# Patient Record
Sex: Female | Born: 1940
Health system: Southern US, Community
[De-identification: ages and names within clinical notes are randomized; demographics above are authoritative.]

## PROBLEM LIST (undated history)

## (undated) DIAGNOSIS — E079 Disorder of thyroid, unspecified: Secondary | ICD-10-CM

## (undated) DIAGNOSIS — T7840XA Allergy, unspecified, initial encounter: Secondary | ICD-10-CM

## (undated) DIAGNOSIS — N879 Dysplasia of cervix uteri, unspecified: Secondary | ICD-10-CM

## (undated) DIAGNOSIS — I10 Essential (primary) hypertension: Secondary | ICD-10-CM

## (undated) DIAGNOSIS — H269 Unspecified cataract: Secondary | ICD-10-CM

## (undated) DIAGNOSIS — N736 Female pelvic peritoneal adhesions (postinfective): Secondary | ICD-10-CM

## (undated) DIAGNOSIS — R102 Pelvic and perineal pain unspecified side: Secondary | ICD-10-CM

## (undated) DIAGNOSIS — F419 Anxiety disorder, unspecified: Secondary | ICD-10-CM

## (undated) DIAGNOSIS — K219 Gastro-esophageal reflux disease without esophagitis: Secondary | ICD-10-CM

## (undated) DIAGNOSIS — R51 Headache: Secondary | ICD-10-CM

## (undated) DIAGNOSIS — J329 Chronic sinusitis, unspecified: Secondary | ICD-10-CM

## (undated) DIAGNOSIS — K589 Irritable bowel syndrome without diarrhea: Secondary | ICD-10-CM

## (undated) DIAGNOSIS — M8580 Other specified disorders of bone density and structure, unspecified site: Secondary | ICD-10-CM

## (undated) DIAGNOSIS — M1990 Unspecified osteoarthritis, unspecified site: Secondary | ICD-10-CM

## (undated) HISTORY — DX: Allergy, unspecified, initial encounter: T78.40XA

## (undated) HISTORY — PX: FOOT SURGERY: SHX648

## (undated) HISTORY — PX: HAMMER TOE SURGERY: SHX385

## (undated) HISTORY — DX: Anxiety disorder, unspecified: F41.9

## (undated) HISTORY — PX: VESICOVAGINAL FISTULA CLOSURE W/ TAH: SUR271

## (undated) HISTORY — PX: COLPOSCOPY: SHX161

## (undated) HISTORY — DX: Female pelvic peritoneal adhesions (postinfective): N73.6

## (undated) HISTORY — DX: Chronic sinusitis, unspecified: J32.9

## (undated) HISTORY — DX: Pelvic and perineal pain unspecified side: R10.20

## (undated) HISTORY — DX: Disorder of thyroid, unspecified: E07.9

## (undated) HISTORY — DX: Headache: R51

## (undated) HISTORY — PX: EYE SURGERY: SHX253

## (undated) HISTORY — PX: TONSILLECTOMY: SUR1361

## (undated) HISTORY — DX: Pelvic and perineal pain: R10.2

## (undated) HISTORY — PX: ROTATOR CUFF REPAIR: SHX139

## (undated) HISTORY — DX: Other specified disorders of bone density and structure, unspecified site: M85.80

## (undated) HISTORY — DX: Irritable bowel syndrome, unspecified: K58.9

## (undated) HISTORY — DX: Essential (primary) hypertension: I10

## (undated) HISTORY — PX: BREAST SURGERY: SHX581

## (undated) HISTORY — DX: Unspecified cataract: H26.9

## (undated) HISTORY — DX: Dysplasia of cervix uteri, unspecified: N87.9

## (undated) HISTORY — DX: Gastro-esophageal reflux disease without esophagitis: K21.9

## (undated) HISTORY — DX: Unspecified osteoarthritis, unspecified site: M19.90

---

## 1979-01-15 HISTORY — PX: TUBAL LIGATION: SHX77

## 1980-02-15 HISTORY — PX: OTHER SURGICAL HISTORY: SHX169

## 1981-01-14 HISTORY — PX: ABDOMINAL HYSTERECTOMY: SHX81

## 1992-01-15 HISTORY — PX: APPENDECTOMY: SHX54

## 1996-10-13 ENCOUNTER — Encounter: Payer: Self-pay | Admitting: Pulmonary Disease

## 1997-04-18 ENCOUNTER — Encounter: Admission: RE | Admit: 1997-04-18 | Discharge: 1997-07-17 | Payer: Self-pay | Admitting: Pulmonary Disease

## 1998-11-10 ENCOUNTER — Encounter: Payer: Self-pay | Admitting: Pulmonary Disease

## 1998-11-10 ENCOUNTER — Ambulatory Visit (HOSPITAL_COMMUNITY): Admission: RE | Admit: 1998-11-10 | Discharge: 1998-11-10 | Payer: Self-pay | Admitting: Pulmonary Disease

## 1999-05-15 ENCOUNTER — Encounter: Admission: RE | Admit: 1999-05-15 | Discharge: 1999-05-15 | Payer: Self-pay | Admitting: Pulmonary Disease

## 1999-05-15 ENCOUNTER — Encounter: Payer: Self-pay | Admitting: Pulmonary Disease

## 2000-08-11 ENCOUNTER — Other Ambulatory Visit: Admission: RE | Admit: 2000-08-11 | Discharge: 2000-08-11 | Payer: Self-pay | Admitting: Obstetrics and Gynecology

## 2001-08-11 ENCOUNTER — Other Ambulatory Visit: Admission: RE | Admit: 2001-08-11 | Discharge: 2001-08-11 | Payer: Self-pay | Admitting: Obstetrics and Gynecology

## 2002-08-16 ENCOUNTER — Other Ambulatory Visit: Admission: RE | Admit: 2002-08-16 | Discharge: 2002-08-16 | Payer: Self-pay | Admitting: Obstetrics and Gynecology

## 2003-01-28 ENCOUNTER — Ambulatory Visit (HOSPITAL_COMMUNITY): Admission: RE | Admit: 2003-01-28 | Discharge: 2003-01-28 | Payer: Self-pay | Admitting: Pulmonary Disease

## 2003-08-17 ENCOUNTER — Other Ambulatory Visit: Admission: RE | Admit: 2003-08-17 | Discharge: 2003-08-17 | Payer: Self-pay | Admitting: Obstetrics and Gynecology

## 2003-09-14 ENCOUNTER — Ambulatory Visit (HOSPITAL_COMMUNITY): Admission: RE | Admit: 2003-09-14 | Discharge: 2003-09-14 | Payer: Self-pay | Admitting: Pulmonary Disease

## 2003-11-30 ENCOUNTER — Ambulatory Visit: Payer: Self-pay | Admitting: Pulmonary Disease

## 2004-03-05 ENCOUNTER — Ambulatory Visit: Payer: Self-pay | Admitting: Pulmonary Disease

## 2004-03-26 ENCOUNTER — Ambulatory Visit: Payer: Self-pay

## 2004-04-23 ENCOUNTER — Ambulatory Visit: Payer: Self-pay | Admitting: Internal Medicine

## 2004-04-23 ENCOUNTER — Ambulatory Visit: Payer: Self-pay | Admitting: Pulmonary Disease

## 2004-05-25 ENCOUNTER — Ambulatory Visit: Payer: Self-pay | Admitting: Pulmonary Disease

## 2004-06-04 ENCOUNTER — Ambulatory Visit (HOSPITAL_BASED_OUTPATIENT_CLINIC_OR_DEPARTMENT_OTHER): Admission: RE | Admit: 2004-06-04 | Discharge: 2004-06-04 | Payer: Self-pay | Admitting: Orthopedic Surgery

## 2004-06-04 ENCOUNTER — Ambulatory Visit (HOSPITAL_COMMUNITY): Admission: RE | Admit: 2004-06-04 | Discharge: 2004-06-04 | Payer: Self-pay | Admitting: Orthopedic Surgery

## 2004-08-20 ENCOUNTER — Other Ambulatory Visit: Admission: RE | Admit: 2004-08-20 | Discharge: 2004-08-20 | Payer: Self-pay | Admitting: Obstetrics and Gynecology

## 2004-10-31 ENCOUNTER — Ambulatory Visit: Payer: Self-pay | Admitting: Pulmonary Disease

## 2004-11-15 ENCOUNTER — Ambulatory Visit: Payer: Self-pay | Admitting: Pulmonary Disease

## 2004-12-28 ENCOUNTER — Ambulatory Visit: Payer: Self-pay | Admitting: Pulmonary Disease

## 2005-03-08 ENCOUNTER — Ambulatory Visit: Payer: Self-pay | Admitting: Physical Medicine and Rehabilitation

## 2005-03-08 ENCOUNTER — Encounter
Admission: RE | Admit: 2005-03-08 | Discharge: 2005-06-06 | Payer: Self-pay | Admitting: Physical Medicine and Rehabilitation

## 2005-03-14 ENCOUNTER — Encounter
Admission: RE | Admit: 2005-03-14 | Discharge: 2005-03-20 | Payer: Self-pay | Admitting: Physical Medicine and Rehabilitation

## 2005-04-16 ENCOUNTER — Ambulatory Visit: Payer: Self-pay | Admitting: Gastroenterology

## 2005-04-17 ENCOUNTER — Encounter (INDEPENDENT_AMBULATORY_CARE_PROVIDER_SITE_OTHER): Payer: Self-pay | Admitting: *Deleted

## 2005-04-17 ENCOUNTER — Ambulatory Visit: Payer: Self-pay | Admitting: Gastroenterology

## 2005-04-17 DIAGNOSIS — D139 Benign neoplasm of ill-defined sites within the digestive system: Secondary | ICD-10-CM

## 2005-04-18 ENCOUNTER — Ambulatory Visit: Payer: Self-pay | Admitting: Gastroenterology

## 2005-04-22 ENCOUNTER — Encounter: Payer: Self-pay | Admitting: Gastroenterology

## 2005-04-23 ENCOUNTER — Ambulatory Visit: Payer: Self-pay | Admitting: Pulmonary Disease

## 2005-05-21 ENCOUNTER — Ambulatory Visit: Payer: Self-pay | Admitting: Gastroenterology

## 2005-07-08 ENCOUNTER — Ambulatory Visit: Payer: Self-pay | Admitting: Pulmonary Disease

## 2005-07-15 ENCOUNTER — Ambulatory Visit (HOSPITAL_COMMUNITY): Admission: RE | Admit: 2005-07-15 | Discharge: 2005-07-15 | Payer: Self-pay | Admitting: Pulmonary Disease

## 2005-08-21 ENCOUNTER — Other Ambulatory Visit: Admission: RE | Admit: 2005-08-21 | Discharge: 2005-08-21 | Payer: Self-pay | Admitting: Obstetrics and Gynecology

## 2005-09-05 ENCOUNTER — Ambulatory Visit: Payer: Self-pay | Admitting: Gastroenterology

## 2005-09-27 ENCOUNTER — Ambulatory Visit: Payer: Self-pay | Admitting: Gastroenterology

## 2005-10-14 ENCOUNTER — Ambulatory Visit: Payer: Self-pay | Admitting: Gastroenterology

## 2005-10-29 ENCOUNTER — Ambulatory Visit: Payer: Self-pay | Admitting: Pulmonary Disease

## 2005-11-11 ENCOUNTER — Ambulatory Visit: Payer: Self-pay | Admitting: Pulmonary Disease

## 2005-12-11 ENCOUNTER — Ambulatory Visit: Payer: Self-pay | Admitting: Pulmonary Disease

## 2006-04-25 ENCOUNTER — Ambulatory Visit: Payer: Self-pay | Admitting: Pulmonary Disease

## 2006-04-25 LAB — CONVERTED CEMR LAB
Albumin: 3.6 g/dL (ref 3.5–5.2)
Basophils Absolute: 0 10*3/uL (ref 0.0–0.1)
Bilirubin, Direct: 0.1 mg/dL (ref 0.0–0.3)
CO2: 30 meq/L (ref 19–32)
Cholesterol: 183 mg/dL (ref 0–200)
Creatinine, Ser: 0.8 mg/dL (ref 0.4–1.2)
Glucose, Bld: 101 mg/dL — ABNORMAL HIGH (ref 70–99)
HCT: 37.6 % (ref 36.0–46.0)
HDL: 57.6 mg/dL (ref >39.0)
Hemoglobin, Urine: NEGATIVE
Hemoglobin: 13.1 g/dL (ref 12.0–15.0)
Leukocytes, UA: NEGATIVE
MCHC: 34.9 g/dL (ref 30.0–36.0)
Monocytes Absolute: 0.6 10*3/uL (ref 0.2–0.7)
Neutrophils Relative %: 54.7 % (ref 43.0–77.0)
Potassium: 4.3 meq/L (ref 3.5–5.1)
RDW: 11.8 % (ref 11.5–14.6)
Sodium: 144 meq/L (ref 135–145)
TSH: 1.67 microintl units/mL (ref 0.35–5.50)
Total Bilirubin: 0.7 mg/dL (ref 0.3–1.2)
Total Protein: 6.7 g/dL (ref 6.0–8.3)
Urobilinogen, UA: 0.2 (ref 0.0–1.0)
pH: 7 (ref 5.0–8.0)

## 2006-04-28 ENCOUNTER — Ambulatory Visit (HOSPITAL_BASED_OUTPATIENT_CLINIC_OR_DEPARTMENT_OTHER): Admission: RE | Admit: 2006-04-28 | Discharge: 2006-04-28 | Payer: Self-pay | Admitting: Orthopedic Surgery

## 2006-06-06 ENCOUNTER — Ambulatory Visit: Payer: Self-pay | Admitting: Pulmonary Disease

## 2006-06-06 LAB — CONVERTED CEMR LAB: OCCULT 1: POSITIVE — AB

## 2006-06-24 ENCOUNTER — Ambulatory Visit: Payer: Self-pay | Admitting: Pulmonary Disease

## 2006-07-15 ENCOUNTER — Ambulatory Visit: Payer: Self-pay | Admitting: Pulmonary Disease

## 2006-07-15 LAB — CONVERTED CEMR LAB
Fecal Occult Blood: NEGATIVE
OCCULT 2: NEGATIVE
OCCULT 3: NEGATIVE
OCCULT 4: NEGATIVE
OCCULT 5: NEGATIVE

## 2006-08-22 ENCOUNTER — Ambulatory Visit: Payer: Self-pay | Admitting: Pulmonary Disease

## 2006-09-10 ENCOUNTER — Other Ambulatory Visit: Admission: RE | Admit: 2006-09-10 | Discharge: 2006-09-10 | Payer: Self-pay | Admitting: Obstetrics and Gynecology

## 2006-11-10 ENCOUNTER — Ambulatory Visit: Payer: Self-pay | Admitting: Pulmonary Disease

## 2006-12-05 ENCOUNTER — Ambulatory Visit: Payer: Self-pay | Admitting: Pulmonary Disease

## 2007-01-30 ENCOUNTER — Telehealth (INDEPENDENT_AMBULATORY_CARE_PROVIDER_SITE_OTHER): Payer: Self-pay | Admitting: *Deleted

## 2007-01-30 ENCOUNTER — Ambulatory Visit: Payer: Self-pay | Admitting: Pulmonary Disease

## 2007-01-30 DIAGNOSIS — K5900 Constipation, unspecified: Secondary | ICD-10-CM | POA: Insufficient documentation

## 2007-01-30 DIAGNOSIS — T7840XA Allergy, unspecified, initial encounter: Secondary | ICD-10-CM | POA: Insufficient documentation

## 2007-01-30 DIAGNOSIS — K219 Gastro-esophageal reflux disease without esophagitis: Secondary | ICD-10-CM

## 2007-01-30 DIAGNOSIS — M1990 Unspecified osteoarthritis, unspecified site: Secondary | ICD-10-CM | POA: Insufficient documentation

## 2007-01-30 DIAGNOSIS — R519 Headache, unspecified: Secondary | ICD-10-CM | POA: Insufficient documentation

## 2007-01-30 DIAGNOSIS — R51 Headache: Secondary | ICD-10-CM

## 2007-01-30 DIAGNOSIS — I10 Essential (primary) hypertension: Secondary | ICD-10-CM

## 2007-01-30 DIAGNOSIS — K589 Irritable bowel syndrome without diarrhea: Secondary | ICD-10-CM | POA: Insufficient documentation

## 2007-02-02 ENCOUNTER — Encounter: Payer: Self-pay | Admitting: Pulmonary Disease

## 2007-03-04 ENCOUNTER — Telehealth: Payer: Self-pay | Admitting: Pulmonary Disease

## 2007-03-05 ENCOUNTER — Telehealth (INDEPENDENT_AMBULATORY_CARE_PROVIDER_SITE_OTHER): Payer: Self-pay | Admitting: *Deleted

## 2007-03-12 ENCOUNTER — Telehealth: Payer: Self-pay | Admitting: Pulmonary Disease

## 2007-04-16 ENCOUNTER — Telehealth: Payer: Self-pay | Admitting: Pulmonary Disease

## 2007-04-27 ENCOUNTER — Ambulatory Visit: Payer: Self-pay | Admitting: Pulmonary Disease

## 2007-04-29 ENCOUNTER — Encounter: Payer: Self-pay | Admitting: Pulmonary Disease

## 2007-04-29 ENCOUNTER — Ambulatory Visit: Payer: Self-pay | Admitting: Family Medicine

## 2007-05-10 LAB — CONVERTED CEMR LAB
ALT: 19 units/L (ref 0–35)
AST: 20 units/L (ref 0–37)
Alkaline Phosphatase: 53 units/L (ref 39–117)
Bilirubin, Direct: 0.1 mg/dL (ref 0.0–0.3)
CO2: 27 meq/L (ref 19–32)
Calcium: 8.8 mg/dL (ref 8.4–10.5)
Chloride: 106 meq/L (ref 96–112)
Eosinophils Relative: 1.4 % (ref 0.0–5.0)
Glucose, Bld: 91 mg/dL (ref 70–99)
HCT: 38.3 % (ref 36.0–46.0)
Hemoglobin, Urine: NEGATIVE
Hemoglobin: 12.8 g/dL (ref 12.0–15.0)
Lymphocytes Relative: 35.2 % (ref 12.0–46.0)
Monocytes Absolute: 0.5 10*3/uL (ref 0.1–1.0)
Monocytes Relative: 7.4 % (ref 3.0–12.0)
Mucus, UA: NEGATIVE
Neutro Abs: 3.9 10*3/uL (ref 1.4–7.7)
Potassium: 4.4 meq/L (ref 3.5–5.1)
Sodium: 143 meq/L (ref 135–145)
Total Bilirubin: 0.5 mg/dL (ref 0.3–1.2)
Total CHOL/HDL Ratio: 3.4
Total Protein: 6.8 g/dL (ref 6.0–8.3)
Urine Glucose: NEGATIVE mg/dL
WBC, UA: NONE SEEN cells/hpf
WBC: 7 10*3/uL (ref 4.5–10.5)
pH: 6 (ref 5.0–8.0)

## 2007-05-21 ENCOUNTER — Telehealth: Payer: Self-pay | Admitting: Pulmonary Disease

## 2007-05-28 ENCOUNTER — Telehealth: Payer: Self-pay | Admitting: Pulmonary Disease

## 2007-06-02 ENCOUNTER — Telehealth (INDEPENDENT_AMBULATORY_CARE_PROVIDER_SITE_OTHER): Payer: Self-pay | Admitting: *Deleted

## 2007-06-02 ENCOUNTER — Telehealth: Payer: Self-pay | Admitting: Gastroenterology

## 2007-06-03 ENCOUNTER — Ambulatory Visit: Payer: Self-pay | Admitting: Gastroenterology

## 2007-06-03 DIAGNOSIS — K449 Diaphragmatic hernia without obstruction or gangrene: Secondary | ICD-10-CM | POA: Insufficient documentation

## 2007-06-09 ENCOUNTER — Ambulatory Visit (HOSPITAL_COMMUNITY): Admission: RE | Admit: 2007-06-09 | Discharge: 2007-06-09 | Payer: Self-pay | Admitting: Gastroenterology

## 2007-06-23 ENCOUNTER — Telehealth: Payer: Self-pay | Admitting: Gastroenterology

## 2007-08-27 ENCOUNTER — Telehealth: Payer: Self-pay | Admitting: Pulmonary Disease

## 2007-08-31 ENCOUNTER — Telehealth (INDEPENDENT_AMBULATORY_CARE_PROVIDER_SITE_OTHER): Payer: Self-pay | Admitting: *Deleted

## 2007-09-10 ENCOUNTER — Encounter: Payer: Self-pay | Admitting: Pulmonary Disease

## 2007-09-11 ENCOUNTER — Other Ambulatory Visit: Admission: RE | Admit: 2007-09-11 | Discharge: 2007-09-11 | Payer: Self-pay | Admitting: Obstetrics and Gynecology

## 2007-09-23 ENCOUNTER — Encounter: Payer: Self-pay | Admitting: Gastroenterology

## 2007-10-16 ENCOUNTER — Telehealth (INDEPENDENT_AMBULATORY_CARE_PROVIDER_SITE_OTHER): Payer: Self-pay | Admitting: *Deleted

## 2007-10-16 ENCOUNTER — Emergency Department (HOSPITAL_COMMUNITY): Admission: EM | Admit: 2007-10-16 | Discharge: 2007-10-16 | Payer: Self-pay | Admitting: Emergency Medicine

## 2007-10-27 ENCOUNTER — Ambulatory Visit: Payer: Self-pay | Admitting: Pulmonary Disease

## 2007-11-12 ENCOUNTER — Telehealth (INDEPENDENT_AMBULATORY_CARE_PROVIDER_SITE_OTHER): Payer: Self-pay | Admitting: *Deleted

## 2008-02-03 ENCOUNTER — Telehealth: Payer: Self-pay | Admitting: Pulmonary Disease

## 2008-02-22 ENCOUNTER — Ambulatory Visit: Payer: Self-pay | Admitting: Pulmonary Disease

## 2008-02-22 DIAGNOSIS — J019 Acute sinusitis, unspecified: Secondary | ICD-10-CM

## 2008-03-07 ENCOUNTER — Telehealth: Payer: Self-pay | Admitting: Pulmonary Disease

## 2008-04-26 ENCOUNTER — Ambulatory Visit: Payer: Self-pay | Admitting: Pulmonary Disease

## 2008-04-26 ENCOUNTER — Telehealth (INDEPENDENT_AMBULATORY_CARE_PROVIDER_SITE_OTHER): Payer: Self-pay | Admitting: *Deleted

## 2008-04-26 DIAGNOSIS — R252 Cramp and spasm: Secondary | ICD-10-CM

## 2008-05-04 LAB — CONVERTED CEMR LAB
BUN: 20 mg/dL (ref 6–23)
CO2: 30 meq/L (ref 19–32)
Calcium: 9.3 mg/dL (ref 8.4–10.5)
Chloride: 104 meq/L (ref 96–112)
Creatinine, Ser: 0.9 mg/dL (ref 0.4–1.2)
Glucose, Bld: 86 mg/dL (ref 70–99)

## 2008-05-11 ENCOUNTER — Ambulatory Visit: Payer: Self-pay | Admitting: Pulmonary Disease

## 2008-05-11 DIAGNOSIS — R269 Unspecified abnormalities of gait and mobility: Secondary | ICD-10-CM | POA: Insufficient documentation

## 2008-05-14 LAB — CONVERTED CEMR LAB
AST: 22 units/L (ref 0–37)
Albumin: 3.8 g/dL (ref 3.5–5.2)
Alkaline Phosphatase: 65 units/L (ref 39–117)
Basophils Relative: 0.6 % (ref 0.0–3.0)
Bilirubin Urine: NEGATIVE
CO2: 29 meq/L (ref 19–32)
Calcium: 9.1 mg/dL (ref 8.4–10.5)
Direct LDL: 105.2 mg/dL
Eosinophils Absolute: 0.1 10*3/uL (ref 0.0–0.7)
Glucose, Bld: 89 mg/dL (ref 70–99)
HCT: 37.6 % (ref 36.0–46.0)
Hemoglobin, Urine: NEGATIVE
Hemoglobin: 13.4 g/dL (ref 12.0–15.0)
Ketones, ur: NEGATIVE mg/dL
Leukocytes, UA: NEGATIVE
Lymphocytes Relative: 35.2 % (ref 12.0–46.0)
Lymphs Abs: 2.3 10*3/uL (ref 0.7–4.0)
MCHC: 35.6 g/dL (ref 30.0–36.0)
Monocytes Relative: 7.1 % (ref 3.0–12.0)
Neutro Abs: 3.5 10*3/uL (ref 1.4–7.7)
Potassium: 3.9 meq/L (ref 3.5–5.1)
RBC: 4.07 M/uL (ref 3.87–5.11)
RDW: 11.8 % (ref 11.5–14.6)
Sodium: 141 meq/L (ref 135–145)
TSH: 2.52 microintl units/mL (ref 0.35–5.50)
Total CHOL/HDL Ratio: 3
Total Protein: 7.4 g/dL (ref 6.0–8.3)
Triglycerides: 116 mg/dL (ref 0.0–149.0)
Urine Glucose: NEGATIVE mg/dL
Urobilinogen, UA: 0.2 (ref 0.0–1.0)

## 2008-06-21 ENCOUNTER — Encounter: Payer: Self-pay | Admitting: Pulmonary Disease

## 2008-06-22 ENCOUNTER — Encounter: Payer: Self-pay | Admitting: Pulmonary Disease

## 2008-09-12 ENCOUNTER — Other Ambulatory Visit: Admission: RE | Admit: 2008-09-12 | Discharge: 2008-09-12 | Payer: Self-pay | Admitting: Obstetrics and Gynecology

## 2008-09-12 ENCOUNTER — Encounter: Payer: Self-pay | Admitting: Obstetrics and Gynecology

## 2008-09-12 ENCOUNTER — Ambulatory Visit: Payer: Self-pay | Admitting: Obstetrics and Gynecology

## 2008-11-08 ENCOUNTER — Ambulatory Visit: Payer: Self-pay | Admitting: Pulmonary Disease

## 2008-11-08 DIAGNOSIS — E785 Hyperlipidemia, unspecified: Secondary | ICD-10-CM

## 2008-11-17 ENCOUNTER — Ambulatory Visit: Payer: Self-pay | Admitting: Adult Health

## 2008-12-12 ENCOUNTER — Telehealth (INDEPENDENT_AMBULATORY_CARE_PROVIDER_SITE_OTHER): Payer: Self-pay | Admitting: *Deleted

## 2008-12-19 ENCOUNTER — Telehealth (INDEPENDENT_AMBULATORY_CARE_PROVIDER_SITE_OTHER): Payer: Self-pay | Admitting: *Deleted

## 2009-01-30 ENCOUNTER — Ambulatory Visit: Payer: Self-pay | Admitting: Pulmonary Disease

## 2009-01-30 ENCOUNTER — Encounter: Payer: Self-pay | Admitting: Adult Health

## 2009-01-31 LAB — CONVERTED CEMR LAB
ALT: 22 units/L (ref 0–35)
AST: 24 units/L (ref 0–37)
Albumin: 3.9 g/dL (ref 3.5–5.2)
BUN: 16 mg/dL (ref 6–23)
Basophils Absolute: 0.1 10*3/uL (ref 0.0–0.1)
Basophils Relative: 0.9 % (ref 0.0–3.0)
Bilirubin Urine: NEGATIVE
Calcium: 9.4 mg/dL (ref 8.4–10.5)
Creatinine, Ser: 0.9 mg/dL (ref 0.4–1.2)
Eosinophils Absolute: 0.1 10*3/uL (ref 0.0–0.7)
HCT: 39.1 % (ref 36.0–46.0)
Hemoglobin, Urine: NEGATIVE
Hemoglobin: 13.4 g/dL (ref 12.0–15.0)
Leukocytes, UA: NEGATIVE
Lymphs Abs: 2.8 10*3/uL (ref 0.7–4.0)
MCHC: 34.4 g/dL (ref 30.0–36.0)
MCV: 93.4 fL (ref 78.0–100.0)
Neutro Abs: 3 10*3/uL (ref 1.4–7.7)
Nitrite: NEGATIVE
RBC: 4.19 M/uL (ref 3.87–5.11)
RDW: 12.1 % (ref 11.5–14.6)
TSH: 1.9 microintl units/mL (ref 0.35–5.50)
Total Bilirubin: 0.5 mg/dL (ref 0.3–1.2)
Total Protein, Urine: NEGATIVE mg/dL
pH: 5.5 (ref 5.0–8.0)

## 2009-02-17 ENCOUNTER — Ambulatory Visit: Payer: Self-pay | Admitting: Obstetrics and Gynecology

## 2009-03-02 ENCOUNTER — Telehealth: Payer: Self-pay | Admitting: Pulmonary Disease

## 2009-03-07 ENCOUNTER — Encounter (INDEPENDENT_AMBULATORY_CARE_PROVIDER_SITE_OTHER): Payer: Self-pay | Admitting: *Deleted

## 2009-03-09 ENCOUNTER — Telehealth (INDEPENDENT_AMBULATORY_CARE_PROVIDER_SITE_OTHER): Payer: Self-pay | Admitting: *Deleted

## 2009-04-05 ENCOUNTER — Ambulatory Visit: Payer: Self-pay | Admitting: Gastroenterology

## 2009-05-08 ENCOUNTER — Ambulatory Visit: Payer: Self-pay | Admitting: Pulmonary Disease

## 2009-05-09 ENCOUNTER — Ambulatory Visit: Payer: Self-pay | Admitting: Pulmonary Disease

## 2009-05-11 ENCOUNTER — Telehealth: Payer: Self-pay | Admitting: Pulmonary Disease

## 2009-05-12 ENCOUNTER — Telehealth (INDEPENDENT_AMBULATORY_CARE_PROVIDER_SITE_OTHER): Payer: Self-pay | Admitting: *Deleted

## 2009-05-13 LAB — CONVERTED CEMR LAB
BUN: 9 mg/dL (ref 6–23)
Basophils Absolute: 0 10*3/uL (ref 0.0–0.1)
Bilirubin, Direct: 0.1 mg/dL (ref 0.0–0.3)
CO2: 28 meq/L (ref 19–32)
Chloride: 109 meq/L (ref 96–112)
GFR calc non Af Amer: 80.32 mL/min (ref >60)
Glucose, Bld: 85 mg/dL (ref 70–99)
HCT: 35.8 % — ABNORMAL LOW (ref 36.0–46.0)
HDL: 60.8 mg/dL (ref >39.00)
Hemoglobin: 12.4 g/dL (ref 12.0–15.0)
LDL Cholesterol: 87 mg/dL (ref 0–99)
Lymphs Abs: 2.9 10*3/uL (ref 0.7–4.0)
MCHC: 34.6 g/dL (ref 30.0–36.0)
MCV: 91.8 fL (ref 78.0–100.0)
Monocytes Absolute: 0.7 10*3/uL (ref 0.1–1.0)
Monocytes Relative: 9.4 % (ref 3.0–12.0)
Neutro Abs: 3.9 10*3/uL (ref 1.4–7.7)
Platelets: 257 10*3/uL (ref 150.0–400.0)
Potassium: 4.2 meq/L (ref 3.5–5.1)
RDW: 12.8 % (ref 11.5–14.6)
Sodium: 143 meq/L (ref 135–145)
Total Bilirubin: 0.4 mg/dL (ref 0.3–1.2)
Total CHOL/HDL Ratio: 3
VLDL: 27.6 mg/dL (ref 0.0–40.0)

## 2009-05-17 ENCOUNTER — Ambulatory Visit: Payer: Self-pay | Admitting: Internal Medicine

## 2009-05-17 ENCOUNTER — Encounter: Payer: Self-pay | Admitting: Pulmonary Disease

## 2009-09-13 ENCOUNTER — Ambulatory Visit: Payer: Self-pay | Admitting: Obstetrics and Gynecology

## 2009-09-13 ENCOUNTER — Other Ambulatory Visit: Admission: RE | Admit: 2009-09-13 | Discharge: 2009-09-13 | Payer: Self-pay | Admitting: Obstetrics and Gynecology

## 2009-10-04 ENCOUNTER — Telehealth: Payer: Self-pay | Admitting: Pulmonary Disease

## 2009-10-10 ENCOUNTER — Ambulatory Visit: Payer: Self-pay | Admitting: Obstetrics and Gynecology

## 2009-11-02 ENCOUNTER — Telehealth (INDEPENDENT_AMBULATORY_CARE_PROVIDER_SITE_OTHER): Payer: Self-pay | Admitting: *Deleted

## 2009-11-07 ENCOUNTER — Ambulatory Visit: Payer: Self-pay | Admitting: Pulmonary Disease

## 2009-11-14 ENCOUNTER — Encounter: Admission: RE | Admit: 2009-11-14 | Discharge: 2009-11-14 | Payer: Self-pay | Admitting: Orthopedic Surgery

## 2009-11-22 ENCOUNTER — Telehealth (INDEPENDENT_AMBULATORY_CARE_PROVIDER_SITE_OTHER): Payer: Self-pay | Admitting: *Deleted

## 2009-11-27 ENCOUNTER — Ambulatory Visit: Payer: Self-pay | Admitting: Pulmonary Disease

## 2009-11-28 DIAGNOSIS — J209 Acute bronchitis, unspecified: Secondary | ICD-10-CM | POA: Insufficient documentation

## 2009-11-30 ENCOUNTER — Encounter: Admission: RE | Admit: 2009-11-30 | Discharge: 2009-11-30 | Payer: Self-pay | Admitting: Orthopedic Surgery

## 2009-12-06 ENCOUNTER — Telehealth (INDEPENDENT_AMBULATORY_CARE_PROVIDER_SITE_OTHER): Payer: Self-pay | Admitting: *Deleted

## 2009-12-08 ENCOUNTER — Ambulatory Visit: Payer: Self-pay | Admitting: Internal Medicine

## 2009-12-08 LAB — CONVERTED CEMR LAB
ALT: 23 units/L (ref 0–35)
BUN: 15 mg/dL (ref 6–23)
Calcium: 8.8 mg/dL (ref 8.4–10.5)
Chloride: 102 meq/L (ref 96–112)
Creatinine, Ser: 1 mg/dL (ref 0.4–1.2)
Magnesium: 1.9 mg/dL (ref 1.5–2.5)
Total Bilirubin: 0.4 mg/dL (ref 0.3–1.2)
Total Protein: 6.4 g/dL (ref 6.0–8.3)

## 2009-12-26 ENCOUNTER — Encounter: Payer: Self-pay | Admitting: Pulmonary Disease

## 2010-01-10 ENCOUNTER — Ambulatory Visit: Payer: Self-pay | Admitting: Pulmonary Disease

## 2010-01-17 ENCOUNTER — Telehealth (INDEPENDENT_AMBULATORY_CARE_PROVIDER_SITE_OTHER): Payer: Self-pay | Admitting: *Deleted

## 2010-01-18 ENCOUNTER — Telehealth (INDEPENDENT_AMBULATORY_CARE_PROVIDER_SITE_OTHER): Payer: Self-pay | Admitting: *Deleted

## 2010-01-23 ENCOUNTER — Telehealth: Payer: Self-pay | Admitting: Pulmonary Disease

## 2010-02-13 NOTE — Assessment & Plan Note (Signed)
Summary: 6 months/apc-pt req pna shot//kp   Primary Care Provider:  Alroy Dust, MD  CC:  6 month ROV & review of mult medical problems....  History of Present Illness: 70 y/o BF here for a follow up visit... she has mult med problems as noted below... she gets her meds from [facility]...   ~  5/09 saw DrPatterson for GI- chr GERD on PPI Bid... he changed her to Nexium Bid & improved...  ~  8/09 saw DrShoemaker, ENT- c/o dizziness, "sinus"... Rx w/ Claritin, Saline, Flonase...  ~  10/09 went to ER w/ CP & epig discomfort "I think it was something I ate"... full eval in ER was neg including: EKG= norm,  CXR= NAD,  CBC= norm,  Chem/Lipase= norm,  DDimer= norm... no recurrence of symptoms...    ~  May 11, 2008:  she has mult complaints today- we reviewed her list together:      ** c/o balance off, veers to left, gait abnormality- neuro exam nonfocal, we discussed referral.      ** c/o left thigh discomfort c/w meralgia pain- rec> wt loss, doesn't want additional meds.      ** leg cramping/ muscle spasm- rec> try SOMA 350 Bid Prn.      ** c/o pain right flank near 11th-12th ribs, tender area- rec> topical Rx w/ heat, myoflex, pain med Prn.      ** incr stress- Mother (Nannie Leftwich) out of NH and on her own now & doing poorly...   ~  November 08, 2008:  she has gained 12# up to 214# today & notes some incr SOB & LBP... we discussed diet + exercise... she is handling the stress of caring for mother well... OK flu vacc.   ~  May 08, 2009:  she's had a good 43mo other than stress w/ mother... weight unchanged- exerc on treadmilll 20mi/d, but not really on diet & we discussed this... BP controlled;  GI stable- but she has had persistant RUQ/ right lower CWP & DrPatterson has referred her to Pain Management (Appt pending) & we discssed poss Lyrica trial since the discomfort occurs exclusively at night...    Current Problem List:  Health Maintenance - she takes ASA 81mg /d... her GYN is  DrGottsegen & he changed her Premarin to Climara... Mammograms at [facility]- last 8/09... BMD here 4/09 (Improved & Fosamax decr)> due for f/u BMD & poss drug holiday...  ~  Immunizations: had 2010 flu shot 10/10... had Pneumovax 2003 at age 15>  repeat PNEUMOVAX 4/11 age 62.  ALLERGY (ICD-995.3) - 8/09 saw DrShoemaker, ENT- c/o dizziness, "sinus"... Rx w/ Claritin, Saline, Flonase...  HYPERTENSION (ICD-401.9) - on TOPROL XL 100mg /d, & LOTREL 5/20 Daily... BP= 120/64 and similiar at home per pt in the 130/80 range... denies HA, visual changes, CP, palipit, dizziness, syncope, dyspnea, edema, etc... she exercises by walking...  ~  CXR 4/11 was clear, WNL...   HYPERCHOLESTEROLEMIA, BORDERLINE (ICD-272.4) - on diet alone...  ~  FLP 4/10 showed TChol 209, TG 116, HDL 67, LDL 105  ~  FLP 4/11 showed =   GERD (ICD-530.81) - on NEXIUM 40mg Bid...  ~  last EGD was 4/07 by DrPatterson showing chr GERD and gastric polyps...   ~  AbdSonar 2007 w/ no gallstones, negative sonar...   ~  HIDA Scan 5/09 was norm w/ eject fract 76% & patent ducts...  IRRITABLE BOWEL SYNDROME (ICD-564.1) -   ~  last colonoscopy 9/07 by DrPatterson showed melanosis only, no other  abn.Marland KitchenMarland Kitchen  CONSTIPATION (ICD-564.00) - on Metamucil etc...  DEGENERATIVE JOINT DISEASE (ICD-715.90) - she uses arthritis tylenol as needed...  OSTEOPOROSIS (ICD-733.00) - on FOSAMAX 70mg  every other wk + calcium and vits & 1000 VitD.  ~  BMD 4/09 much improved w/ TScores -0.2 to -0.7.Marland KitchenMarland Kitchen Fosamax decr to [employer].  ~  Vit D level 4/09 = 32... rec> Vit D 1000 u daily...  ~  Vit D level 4/10 = 46... rec> continue same.  ~  4/11:  we will recheck BMD & consider bisphos drug holiday...  HEADACHE (ICD-784.0)  ANXIETY (ICD-300.00) - on ALPRAZOLAM 0.5mg  as needed... her husb had an MI...   Allergies: 1)  ! Prednisone 2)  Vioxx 3)  Sulfa 4)  Norvasc 5)  Seldane  Comments:  Nurse/Medical Assistant: The patient's medications and allergies were reviewed  with the patient and were updated in the Medication and Allergy Lists.  Past History:  Past Medical History: ALLERGY (ICD-995.3) HYPERTENSION (ICD-401.9) HYPERCHOLESTEROLEMIA, BORDERLINE (ICD-272.4) GERD (ICD-530.81) IRRITABLE BOWEL SYNDROME (ICD-564.1) CONSTIPATION (ICD-564.00) DEGENERATIVE JOINT DISEASE (ICD-715.90) OSTEOPOROSIS (ICD-733.00) HEADACHE (ICD-784.0) ANXIETY (ICD-300.00)  Past Surgical History: S/P parathyroid adenoma removed S/P Hysterectomy Tonsillectomy Appendectomy  Family History: Reviewed history from 06/03/2007 and no changes required. No FH of Colon Cancer: Family History of Diabetes:  Family History of Heart Disease:  leukimia stroke lung cancer  Social History: Reviewed history from 04/05/2009 and no changes required. Re-married her first husband 1 child never smoked social alcohol retired Illicit Drug Use - no  Review of Systems      See HPI       The patient complains of dyspnea on exertion and peripheral edema.  The patient denies anorexia, fever, weight loss, weight gain, vision loss, decreased hearing, hoarseness, chest pain, syncope, prolonged cough, headaches, hemoptysis, abdominal pain, melena, hematochezia, severe indigestion/heartburn, hematuria, incontinence, muscle weakness, suspicious skin lesions, transient blindness, difficulty walking, depression, unusual weight change, abnormal bleeding, enlarged lymph nodes, and angioedema.    Vital Signs:  Patient profile:   70 year old female Height:      66.5 inches Weight:      210.25 pounds BMI:     33.55 O2 Sat:      100 % on Room air Temp:     97.1 degrees F oral Pulse rate:   65 / minute BP sitting:   120 / 64  (left arm) Cuff size:   regular  Vitals Entered By: Randell Loop CMA (May 08, 2009 11:03 AM)  O2 Sat at Rest %:  100 O2 Flow:  Room air CC: 6 month ROV & review of mult medical problems... Is Patient Diabetic? No Pain Assessment Patient in pain? yes       Onset of pain  in her side---this is followed by Dr. Jarold Motto Comments meds updated today   Physical Exam  Additional Exam:  WD, WN, 70 y/o BF in NAD... GENERAL:  Alert & oriented; pleasant & cooperative... HEENT:  [place]/AT, EOM-wnl, PERRLA, EACs-clear, TMs-wnl, NOSE-clear, THROAT-clear & wnl. NECK:  Supple w/ fairROM; no JVD; normal carotid impulses w/o bruits; no thyromegaly or nodules palpated; no lymphadenopathy. CHEST:  Clear to P & A; without wheezes/ rales/ or rhonchi. HEART:  Regular Rhythm; without murmurs/ rubs/ or gallops. ABDOMEN:  Soft & nontender; normal bowel sounds; no organomegaly or masses detected. EXT: without deformities, mild arthritic changes; no varicose veins/ +venous insuffic/ no edema. NEURO:  CN's intact; motor testing normal; sensory testing normal; ?balance off but no specific gait abn noted. DERM:  No lesions noted; no rash etc...    CXR  Procedure date:  05/08/2009  Findings:      CHEST - 2 VIEW Comparison: 10/16/2007   Findings: Midline trachea.  Normal heart size and mediastinal contours. No pleural effusion or pneumothorax.  Clear lungs.   IMPRESSION: No acute cardiopulmonary disease.   Read By:  Consuello Bossier,  M.D.   Impression & Recommendations:  Problem # 1:  ALLERGY (ICD-995.3) Tolerating spring allergy season fairly well-  continue meds.  Problem # 2:  HYPERTENSION (ICD-401.9) Controlled on meds-  continue same... ret in AM for FASTING labs... Her updated medication list for this problem includes:    Toprol Xl 100 Mg Tb24 (Metoprolol succinate) .Marland Kitchen... Take 1 tablet by mouth once a day    Lotrel 5-20 Mg Caps (Amlodipine besy-benazepril hcl) .Marland Kitchen... Take 1 tablet by mouth once a day  Orders: Prescription Created Electronically [phone]) T-2 View CXR (71020TC)  Problem # 3:  CHEST PAIN (ICD-786.50) She has right lat CWP/ RUQ/ Flank discomfort at night... ?trial Lyrica 50-100mg  Qhs... she has appt pain clinic. Orders: T-2  View CXR (71020TC)  Problem # 4:  HYPERCHOLESTEROLEMIA, BORDERLINE (ICD-272.4) On diet alone-  to ret for FLP.  Problem # 5:  GERD (ICD-530.81) GI is stable per DrPatterson... Her updated medication list for this problem includes:    Nexium 40 Mg Cpdr (Esomeprazole magnesium) .Marland Kitchen... 1 capsule twice a day 30 minutes before meals  Problem # 6:  DEGENERATIVE JOINT DISEASE (ICD-715.90) She has mod DJD + musc spasm on Soma etc... Her updated medication list for this problem includes:    Aspirin 81 Mg Tbec (Aspirin) .Marland Kitchen... Take 1 tab daily...  Problem # 7:  ANXIETY (ICD-300.00) Coping well w/ stress from mother (Nannie Leftwich) & husb's illness... Her updated medication list for this problem includes:    Alprazolam 0.5 Mg Tabs (Alprazolam) .Marland Kitchen... Take 1 tab by mouth three times a day as needed for nerves...  Problem # 8:  OTHER MEDICAL PROBLEMS AS NOTED>>> OK Pneumovax today age 71...  Complete Medication List: 1)  Aspirin 81 Mg Tbec (Aspirin) .... Take 1 tab daily.Marland KitchenMarland Kitchen 2)  Toprol Xl 100 Mg Tb24 (Metoprolol succinate) .... Take 1 tablet by mouth once a day 3)  Lotrel 5-20 Mg Caps (Amlodipine besy-benazepril hcl) .... Take 1 tablet by mouth once a day 4)  Nexium 40 Mg Cpdr (Esomeprazole magnesium) .Marland Kitchen.. 1 capsule twice a day 30 minutes before meals 5)  Climara 0.075 Mg/24hr Ptwk (Estradiol) .... Apply one patch weekly 6)  Fosamax 70 Mg Tabs (Alendronate sodium) .... Take 1 tablet by mouth every 2 weeks 7)  Vitamin D 1000 Unit Tabs (Cholecalciferol) .... Take 1 tablet by mouth once a day 8)  Alprazolam 0.5 Mg Tabs (Alprazolam) .... Take 1 tab by mouth three times a day as needed for nerves.Marland KitchenMarland Kitchen 9)  Soma 350 Mg Tabs (Carisoprodol) .... Take 1 tab by mouth two times a day as needed for muscle spasm... 10)  Multivitamins Tabs (Multiple vitamin) .... Take 1 tablet by mouth once a day 11)  Omega-3 Chews  .... 2 chews per day 12)  Lyrica 50 Mg Caps (Pregabalin) .... Take one tablet by mouth at bedtime  x 10 days then 2 tabs by mouth at bedtime thereafter  Other Orders: T-Bone Densitometry (44010) Pneumococcal Vaccine (27253) Admin 1st Vaccine (66440)  Patient Instructions: 1)  Today we updated your med list- see below.... 2)  We refilled your meds for 2011... 3)  Today  we gave you the PNEUMONIA vaccine (you will not need another one of these based on current recommendations)... 4)  We also did your follow up CXR today... 5)  Please return to our lab one morn this week for your FASTING blood work... please call the "phone tree" in a few days for your lab results.Marland KitchenMarland Kitchen 6)  We will arrange for a f/u Bone density test & if this is stable we will plan a 2 yr "drug holiday" off the fosamax... 7)  Call for any questions... 8)  Keep up the good work w/ diet + exercise... 9)  Please schedule a follow-up appointment in 6 months. Prescriptions: LYRICA 50 MG CAPS (PREGABALIN) take one tablet by mouth at bedtime x 10 days then 2 tabs by mouth at bedtime thereafter  #60 x 5   Entered by:   Randell Loop CMA   Authorized by:   Michele Mcalpine MD   Signed by:   Michele Mcalpine MD on 05/08/2009   Method used:   Telephoned to ...       [facility]. [phone]* (retail)       [address]       [place], [place]  [address]       Ph: [phone]       Fax: [phone]   RxID:   [id number] SOMA 350 MG TABS (CARISOPRODOL) take 1 tab by mouth two times a day as needed for muscle spasm...  #180 x prn   Entered and Authorized by:   Michele Mcalpine MD   Signed by:   Michele Mcalpine MD on 05/08/2009   Method used:   Print then Give to Patient   RxID:   [id number] ALPRAZOLAM 0.5 MG  TABS (ALPRAZOLAM) take 1 tab by mouth three times a day as needed for nerves...  #100 x 5   Entered and Authorized by:   Michele Mcalpine MD   Signed by:   Michele Mcalpine MD on 05/08/2009   Method used:   Print then Give to Patient   RxID:   [id number] NEXIUM 40 MG  CPDR (ESOMEPRAZOLE  MAGNESIUM) 1 capsule twice a day 30 minutes before meals  #180 x prn   Entered and Authorized by:   Michele Mcalpine MD   Signed by:   Michele Mcalpine MD on 05/08/2009   Method used:   Print then Give to Patient   RxID:   [id number] LOTREL 5-20 MG CAPS (AMLODIPINE BESY-BENAZEPRIL HCL) Take 1 tablet by mouth once a day  #90 x prn   Entered and Authorized by:   Michele Mcalpine MD   Signed by:   Michele Mcalpine MD on 05/08/2009   Method used:   Print then Give to Patient   RxID:   [id number] TOPROL XL 100 MG TB24 (METOPROLOL SUCCINATE) Take 1 tablet by mouth once a day  #90 x prn   Entered and Authorized by:   Michele Mcalpine MD   Signed by:   Michele Mcalpine MD on 05/08/2009   Method used:   Print then Give to Patient   RxID:   [id number]     Immunizations Administered:  Pneumonia Vaccine:    Vaccine Type: Pneumovax    Site: left deltoid    Mfr: Merck    Dose: 0.5 ml    Route: IM    Given by: Randell Loop CMA    Exp.  Date: 04/26/2010    Lot #: [id number]    VIS given: 08/12/95 version given May 08, 2009.

## 2010-02-13 NOTE — Progress Notes (Signed)
Summary: muscle spasms  Phone Note Call from Patient Call back at Home Phone [phone]   Caller: Patient Call For: nadel Summary of Call: pt says that her muscled spasms have worsened in the past 3 days and the soma (generic) isn't helping. feels likes "charlie horse cramps in thighs/ legs/ feet. no fever. no N or V. [facility] [address].  Initial call taken by: Tivis Ringer, CNA,  October 04, 2009 9:09 AM  Follow-up for Phone Call        Pt states over the last 3 days her muscle spasms have gotten worse. She states feels like cramps in her legs and thighs which are worse at night. SH ehas been taking soma twice daily but this is not helping. Please advise. Carron Curie CMA  October 04, 2009 9:44 AM   Additional Follow-up for Phone Call Additional follow up Details #1::        per SN---apply heat--change meds to parfon forte  1 by mouth four times daily  #50 and the next step if to refer to ortho---she can call her ortho doc for this appt. Randell Loop CMA  October 04, 2009 9:53 AM   pt advised of recs. rx sent. she has appt with ortho doc tomorrow. Carron Curie CMA  October 04, 2009 10:22 AM     New/Updated Medications: PARAFON FORTE DSC 500 MG TABS (CHLORZOXAZONE) Take 1 tablet by mouth four times a day Prescriptions: PARAFON FORTE DSC 500 MG TABS (CHLORZOXAZONE) Take 1 tablet by mouth four times a day  #50 x 0   Entered by:   Carron Curie CMA   Authorized by:   Michele Mcalpine MD   Signed by:   Carron Curie CMA on 10/04/2009   Method used:   Electronically to        [facility]. [phone]* (retail)       [address]       [place], [place]  [address]       Ph: [phone]       Fax: [phone]   RxID:   [id number]

## 2010-02-13 NOTE — Assessment & Plan Note (Signed)
Summary: Acute NP office visit - bronchitis   Primary Provider/Referring Provider:  Alroy Dust, MD  CC:  prod cough with green mucus, tightness in chest, chills/sweats x87month - currently taking 2nd round of augmentin, and began on Thursday.  History of Present Illness: 70 y/o BF with known history of HTN, DJD, IBS, and Osteoporosis.    January 30, 2009--Presents for acute office visit. Complains of discomfort in back of right  ribs for 3 months, that is painful when she lies down. This happens every night. Worse 3 months ago, took tylenol with some help. Over last week noticed tightness in chest at rest, resolves if she gets up and moves around. Denies chest pain at rest or exertion.,  dyspnea, orthopnea, hemoptysis, fever, n/v/d, edema, headache, palpitations, rash. Intermittent heartburn, bloating, and gas.   11/27/09-Presents for an acute office visit. Complains of prod cough with green mucus, tightness in chest, chills/sweats x17month - currently taking 2nd round of augmentin, began on 4 days ago. Cough is not getting better.  Denies chest pain,  orthopnea, hemoptysis, fever, n/v/d, edema, headache,  Medications Prior to Update: 1)  Aspirin 81 Mg  Tbec (Aspirin) .... Take 1 Tab Daily.Marland KitchenMarland Kitchen 2)  Toprol Xl 100 Mg Tb24 (Metoprolol Succinate) .... Take 1 Tablet By Mouth Once A Day 3)  Lotrel 5-20 Mg Caps (Amlodipine Besy-Benazepril Hcl) .... Take 1 Tablet By Mouth Once A Day 4)  Nexium 40 Mg  Cpdr (Esomeprazole Magnesium) .Marland Kitchen.. 1 Capsule Twice A Day 30 Minutes Before Meals 5)  Premarin 0.625 Mg Tabs (Estrogens Conjugated) .... Take 1 Tablet By Mouth Once A Day 6)  Soma 350 Mg Tabs (Carisoprodol) .... Take 1 Tab By Mouth Two Times A Day As Needed For Muscle Spasm... 7)  Calcium 1000 + D 1000-800 Mg-Unit Tabs (Calcium Carb-Cholecalciferol) .... Take 1 Tablet By Mouth Once A Day 8)  Multivitamins  Tabs (Multiple Vitamin) .... Take 1 Tablet By Mouth Once A Day 9)  Vitamin D 1000 Unit Tabs  (Cholecalciferol) .... Take 1 Tablet By Mouth Once A Day 10)  Magnesium 250 Mg Tabs (Magnesium) .... Take 1 Tablet By Mouth Once A Day 11)  Omega-3 Chews .... 2 Chews Per Day 12)  Alprazolam 0.5 Mg  Tabs (Alprazolam) .... Take 1 Tab By Mouth Three Times A Day As Needed For Nerves... 13)  Augmentin 875-125 Mg Tabs (Amoxicillin-Pot Clavulanate) .... Take 1 Tablet By Mouth Two Times A Day Until Gone 14)  Augmentin 875-125 Mg Tabs (Amoxicillin-Pot Clavulanate) .... Take 1 Tablet By Mouth Two Times A Day Until Gone  Current Medications (verified): 1)  Aspirin 81 Mg  Tbec (Aspirin) .... Take 1 Tab Daily.Marland KitchenMarland Kitchen 2)  Toprol Xl 100 Mg Tb24 (Metoprolol Succinate) .... Take 1 Tablet By Mouth Once A Day 3)  Lotrel 5-20 Mg Caps (Amlodipine Besy-Benazepril Hcl) .... Take 1 Tablet By Mouth Once A Day 4)  Nexium 40 Mg  Cpdr (Esomeprazole Magnesium) .Marland Kitchen.. 1 Capsule Twice A Day 30 Minutes Before Meals 5)  Premarin 0.625 Mg Tabs (Estrogens Conjugated) .... Take 1 Tablet By Mouth Once A Day 6)  Soma 350 Mg Tabs (Carisoprodol) .... Take 1 Tab By Mouth Two Times A Day As Needed For Muscle Spasm... 7)  Calcium 1000 + D 1000-800 Mg-Unit Tabs (Calcium Carb-Cholecalciferol) .... Take 1 Tablet By Mouth Once A Day 8)  Multivitamins  Tabs (Multiple Vitamin) .... Take 1 Tablet By Mouth Once A Day 9)  Vitamin D 1000 Unit Tabs (Cholecalciferol) .... Take 1 Tablet By Mouth  Once A Day 10)  Magnesium 250 Mg Tabs (Magnesium) .... Take 1 Tablet By Mouth Once A Day 11)  Omega-3 Chews .... 2 Chews Per Day 12)  Alprazolam 0.5 Mg  Tabs (Alprazolam) .... Take 1 Tab By Mouth Three Times A Day As Needed For Nerves... 13)  Augmentin 875-125 Mg Tabs (Amoxicillin-Pot Clavulanate) .... Take 1 Tablet By Mouth Two Times A Day Until Gone  Allergies (verified): 1)  ! Prednisone 2)  Vioxx 3)  Sulfa 4)  Norvasc 5)  Seldane  Past History:  Past Medical History: Last updated: 11/07/2009 ALLERGY (ICD-995.3) HYPERTENSION  (ICD-401.9) HYPERCHOLESTEROLEMIA, BORDERLINE (ICD-272.4) GERD (ICD-530.81) IRRITABLE BOWEL SYNDROME (ICD-564.1) CONSTIPATION (ICD-564.00) DEGENERATIVE JOINT DISEASE (ICD-715.90) OSTEOPOROSIS (ICD-733.00) HEADACHE (ICD-784.0) ANXIETY (ICD-300.00)  Past Surgical History: Last updated: 11/07/2009 S/P parathyroid adenoma removed S/P Hysterectomy Tonsillectomy Appendectomy  Family History: Last updated: 06/03/2007 No FH of Colon Cancer: Family History of Diabetes:  Family History of Heart Disease:  leukimia stroke lung cancer  Social History: Last updated: 04/05/2009 Re-married her first husband 1 child never smoked social alcohol retired Illicit Drug Use - no  Risk Factors: Smoking Status: never (11/07/2009)  Review of Systems      See HPI  Vital Signs:  Patient profile:   70 year old female Height:      66.5 inches Weight:      214.13 pounds BMI:     34.17 O2 Sat:      99 % on Room air Temp:     98.1 degrees F oral Pulse rate:   81 / minute BP sitting:   124 / 82  (left arm) Cuff size:   large  Vitals Entered By: Boone Master CNA/MA (November 27, 2009 2:35 PM)  O2 Flow:  Room air CC: prod cough with green mucus, tightness in chest, chills/sweats x70month - currently taking 2nd round of augmentin, began on Thursday Is Patient Diabetic? No Comments Medications reviewed with patient Daytime contact number verified with patient. Boone Master CNA/MA  November 27, 2009 2:34 PM    Physical Exam  Additional Exam:  WD, WN, 70 y/o BF in NAD... GENERAL:  Alert & oriented; pleasant & cooperative... HEENT:  [place]/AT, EOM-wnl, PERRLA, EACs-clear, TMs-wnl, NOSE-mild red, max tenderness,  THROAT-clear & wnl. NECK:  Supple w/ full ROM; no JVD; normal carotid impulses w/o bruits; no thyromegaly or nodules palpated; no lymphadenopathy. CHEST:  Clear to P & A; without wheezes/ rales HEART:  Regular Rhythm; without murmurs/ rubs/ or gallops. ABDOMEN:  Soft & nontender;  normal bowel sounds; no organomegaly or masses detected, no guarding or rebound.   EXT: without deformities, mild arthritic changes; no varicose veins/ venous insuffic/ or edema.     Impression & Recommendations:  Problem # 1:  BRONCHITIS, ACUTE (ICD-466.0)  flare  Plan Finish Augmentin.  Mucinex DM two times a day as needed cough  Hydromet 1/2-1 tsp every 4-6 hrs as needed cough, may make you sleepy.  Prednisone taper over next week.  Please contact office for sooner follow up if symptoms do not improve or worsen  The following medications were removed from the medication list:    Augmentin 875-125 Mg Tabs (Amoxicillin-pot clavulanate) .Marland Kitchen... Take 1 tablet by mouth two times a day until gone Her updated medication list for this problem includes:    Augmentin 875-125 Mg Tabs (Amoxicillin-pot clavulanate) .Marland Kitchen... Take 1 tablet by mouth two times a day until gone    Hydromet 5-1.5 Mg/56ml Syrp (Hydrocodone-homatropine) .Marland Kitchen... 1/2 -1 tsp every 4-6 hr as needed  cough, may make you sleepy.  Orders: Est. Patient Level IV (04540)  Medications Added to Medication List This Visit: 1)  Prednisone 10 Mg Tabs (Prednisone) .... 4 tabs for 2 days, then 3 tabs for 2 days, 2 tabs for 2 days, then 1 tab for 2 days, then stop 2)  Hydromet 5-1.5 Mg/5ml Syrp (Hydrocodone-homatropine) .... 1/2 -1 tsp every 4-6 hr as needed cough, may make you sleepy.  Complete Medication List: 1)  Aspirin 81 Mg Tbec (Aspirin) .... Take 1 tab daily.Marland KitchenMarland Kitchen 2)  Toprol Xl 100 Mg Tb24 (Metoprolol succinate) .... Take 1 tablet by mouth once a day 3)  Lotrel 5-20 Mg Caps (Amlodipine besy-benazepril hcl) .... Take 1 tablet by mouth once a day 4)  Nexium 40 Mg Cpdr (Esomeprazole magnesium) .Marland Kitchen.. 1 capsule twice a day 30 minutes before meals 5)  Premarin 0.625 Mg Tabs (Estrogens conjugated) .... Take 1 tablet by mouth once a day 6)  Soma 350 Mg Tabs (Carisoprodol) .... Take 1 tab by mouth two times a day as needed for muscle spasm... 7)   Calcium 1000 + D 1000-800 Mg-unit Tabs (Calcium carb-cholecalciferol) .... Take 1 tablet by mouth once a day 8)  Multivitamins Tabs (Multiple vitamin) .... Take 1 tablet by mouth once a day 9)  Vitamin D 1000 Unit Tabs (Cholecalciferol) .... Take 1 tablet by mouth once a day 10)  Magnesium 250 Mg Tabs (Magnesium) .... Take 1 tablet by mouth once a day 11)  Omega-3 Chews  .... 2 chews per day 12)  Alprazolam 0.5 Mg Tabs (Alprazolam) .... Take 1 tab by mouth three times a day as needed for nerves... 13)  Augmentin 875-125 Mg Tabs (Amoxicillin-pot clavulanate) .... Take 1 tablet by mouth two times a day until gone 14)  Prednisone 10 Mg Tabs (Prednisone) .... 4 tabs for 2 days, then 3 tabs for 2 days, 2 tabs for 2 days, then 1 tab for 2 days, then stop 15)  Hydromet 5-1.5 Mg/83ml Syrp (Hydrocodone-homatropine) .... 1/2 -1 tsp every 4-6 hr as needed cough, may make you sleepy.  Patient Instructions: 1)  Finish Augmentin.  2)  Mucinex DM two times a day as needed cough  3)  Hydromet 1/2-1 tsp every 4-6 hrs as needed cough, may make you sleepy.  4)  Prednisone taper over next week.  5)  Please contact office for sooner follow up if symptoms do not improve or worsen  Prescriptions: HYDROMET 5-1.5 MG/5ML SYRP (HYDROCODONE-HOMATROPINE) 1/2 -1 tsp every 4-6 hr as needed cough, may make you sleepy.  #8 oz x 0   Entered and Authorized by:   Rubye Oaks NP   Signed by:   Sylvia Helms NP on 11/27/2009   Method used:   Print then Give to Patient   RxID:   [id number] PREDNISONE 10 MG TABS (PREDNISONE) 4 tabs for 2 days, then 3 tabs for 2 days, 2 tabs for 2 days, then 1 tab for 2 days, then stop  #20 x 0   Entered and Authorized by:   Rubye Oaks NP   Signed by:   Rubye Oaks NP on 11/27/2009   Method used:   Electronically to        [facility]. [phone]* (retail)       [address]       [place], [place]  [address]       Ph: [phone]       Fax:  [phone]  RxID:   [id number]

## 2010-02-13 NOTE — Progress Notes (Signed)
Summary: rx  Phone Note Call from Patient Call back at Home Phone [phone]   Caller: Patient Call For: Codi Kertz Reason for Call: Talk to Nurse Summary of Call: need written rx for Metoprolol xl 100 mg #90 w/ 3 refills and Lotrel 5/20mg  #90 w/ 3 refills. Need to pick up today. gouing to [place] tomorrow. Initial call taken by: Eugene Gavia,  March 02, 2009 10:36 AM  Follow-up for Phone Call        rx printed an placed on SN look-at.Carron Curie CMA  March 02, 2009 10:39 AM    rx have been signed by [facility] and are ready to be picked up---spoke with pts husband and he is aware of rx being ready. Randell Loop CMA  March 02, 2009 2:33 PM     Prescriptions: LOTREL 5-20 MG CAPS (AMLODIPINE BESY-BENAZEPRIL HCL) Take 1 tablet by mouth once a day  #90 x 3   Entered by:   Carron Curie CMA   Authorized by:   Michele Mcalpine MD   Signed by:   Carron Curie CMA on 03/02/2009   Method used:   Print then Give to Patient   RxID:   [id number] TOPROL XL 100 MG TB24 (METOPROLOL SUCCINATE) Take 1 tablet by mouth once a day  #90 x 3   Entered by:   Carron Curie CMA   Authorized by:   Michele Mcalpine MD   Signed by:   Carron Curie CMA on 03/02/2009   Method used:   Print then Give to Patient   RxID:   [id number]

## 2010-02-13 NOTE — Letter (Signed)
Summary: New Patient letter  [facility]  [address] [place], [place] [address]   Phone: [phone]  Fax: [phone]       03/07/2009 MRN: [id number]  Margaret [place] [address] [place], [place]  [address]  Dear [name],  Welcome to the Gastroenterology Division at [facility].    You are scheduled to see Dr. Jarold Motto on 3-23-11at 9:30a.m. on the 3rd floor at [facility], [address]  We ask that you try to arrive at our office 15 minutes prior to your appointment time to allow for check-in.  We would like you to complete the enclosed self-administered evaluation form prior to your visit and bring it with you on the day of your appointment.  We will review it with you.  Also, please bring a complete list of all your medications or, if you prefer, bring the medication bottles and we will list them.  Please bring your insurance card so that we may make a copy of it.  If your insurance requires a referral to see a specialist, please bring your referral form from your primary care physician.  Co-payments are due at the time of your visit and may be paid by cash, check or credit card.     Your office visit will consist of a consult with your physician (includes a physical exam), any laboratory testing he/she may order, scheduling of any necessary diagnostic testing (e.g. x-ray, ultrasound, CT-scan), and scheduling of a procedure (e.g. Endoscopy, Colonoscopy) if required.  Please allow enough time on your schedule to allow for any/all of these possibilities.    If you cannot keep your appointment, please call [phone] to cancel or reschedule prior to your appointment date.  This allows [name] the opportunity to schedule an appointment for another patient in need of care.  If you do not cancel or reschedule by 5 p.m. the business day prior to your appointment date, you will be charged a $50.00 late cancellation/no-show fee.    Thank you for  choosing [facility] for your medical needs.  We appreciate the opportunity to care for you.  Please visit [name] at our website  to learn more about our practice.                     Sincerely,                                                             The Gastroenterology Division

## 2010-02-13 NOTE — Assessment & Plan Note (Signed)
Summary: Acute NP office visit - SOB   Primary Provider/Referring Provider:  Alroy Dust  CC:  discomfort in back of ribs .  History of Present Illness: 70 y/o BF with known history of HTN, DJD, IBS, and Osteoporosis.   10/09--follow up visit... she has mult med problems as noted below... she gets her meds from [facility]... went to ER 10/2 w/ CP & epig discomfort "I think it was something I ate"... full eval in Er was neg including: EKG= norm,  CXR= NAD,  CBC= norm,  Chem/Lipase= norm,  DDimer= norm... no recurrence of symptoms...   ~  5/09 saw DrPatterson for GI- chr GERD on PPI Bid... he changed her to Nexium Bid & improved...  ~  8/09 saw DrShoemaker, ENT- c/o dizziness, "sinus"... Rx w/ Claritin, Saline, Flonase...  February 22, 2008 --Complains of 3 weeks of nasal congestion, drainage, stuffy nose, post nasal drip, dry cough, burning sensation, sinus pressure. yellow discharge. Called in medrol dose pack on 02/03/08.   April 26, 2008 --  Acute visit pt c/o leg cramps at night generalized in lower extremities radiating to her feet x 4 days has tried over the counter potassium with no relief. Has hx of leg cramps has used quinine in past with help, but it is off market. Had prolonged sitting at church then later that night legs were cramping.    November 17, 2008 -Presents for a work in visit. Complains of cramping in left calf from the knee down.  states this happened on Monday when she jogged to  the bathroom - doesn't remember turning her leg in any abnormal way. Pt has been walking for exercise , was on walking trail and needed to go to Bathroom. Pain w/ bending knee. no redness, swelling or rash.   January 30, 2009--Presents for acute office visit. Complains of discomfort in back of right  ribs for 3 months, that is painful when she lies down. This happens every night. Worse 3 months ago, took tylenol with some help. Over last week noticed tightness in chest at rest, resolves if she  gets up and moves around. Denies chest pain at rest or exertion.,  dyspnea, orthopnea, hemoptysis, fever, n/v/d, edema, headache, palpitations, rash. Intermittent heartburn, bloating, and gas.   Medications Prior to Update: 1)  Aspirin 81 Mg  Tbec (Aspirin) .... Take 1 Tab Daily.Marland KitchenMarland Kitchen 2)  Toprol Xl 100 Mg Tb24 (Metoprolol Succinate) .... Take 1 Tablet By Mouth Once A Day 3)  Lotrel 5-20 Mg Caps (Amlodipine Besy-Benazepril Hcl) .... Take 1 Tablet By Mouth Once A Day 4)  Nexium 40 Mg  Cpdr (Esomeprazole Magnesium) .Marland Kitchen.. 1 Capsule Twice A Day 30 Minutes Before Meals 5)  Climara 0.075 Mg/24hr Ptwk (Estradiol) .... Apply One Patch Weekly 6)  Fosamax 70 Mg Tabs (Alendronate Sodium) .... Take 1 Tablet By Mouth Every 2 Weeks 7)  Caltrate 600+d Plus 600-400 Mg-Unit  Chew (Calcium Carbonate-Vit D-Min) .... Take 1 Tablet By Mouth Once A Day 8)  Multivitamins   Tabs (Multiple Vitamin) .... Take 1 Tablet By Mouth Once A Day 9)  Vitamin D 1000 Unit Tabs (Cholecalciferol) .... Take 1 Tablet By Mouth Once A Day 10)  Alprazolam 0.5 Mg  Tabs (Alprazolam) .... Take 1 Tab By Mouth Three Times A Day As Needed For Nerves... 11)  Soma 350 Mg Tabs (Carisoprodol) .... Take 1 Tab By Mouth Two Times A Day As Needed For Muscle Spasm... 12)  Augmentin 875-125 Mg Tabs (Amoxicillin-Pot Clavulanate) .Marland KitchenMarland KitchenMarland Kitchen  Take 1 Tablet By Mouth Two Times A Day Until Gone  Current Medications (verified): 1)  Aspirin 81 Mg  Tbec (Aspirin) .... Take 1 Tab Daily.Marland KitchenMarland Kitchen 2)  Toprol Xl 100 Mg Tb24 (Metoprolol Succinate) .... Take 1 Tablet By Mouth Once A Day 3)  Lotrel 5-20 Mg Caps (Amlodipine Besy-Benazepril Hcl) .... Take 1 Tablet By Mouth Once A Day 4)  Nexium 40 Mg  Cpdr (Esomeprazole Magnesium) .Marland Kitchen.. 1 Capsule Twice A Day 30 Minutes Before Meals 5)  Climara 0.075 Mg/24hr Ptwk (Estradiol) .... Apply One Patch Weekly 6)  Fosamax 70 Mg Tabs (Alendronate Sodium) .... Take 1 Tablet By Mouth Every 2 Weeks 7)  Calcium/ Vitamin D 500mg /1000mg  Chew .... 2 Chews  Per Day 8)  Alprazolam 0.5 Mg  Tabs (Alprazolam) .... Take 1 Tab By Mouth Three Times A Day As Needed For Nerves.Marland KitchenMarland Kitchen 9)  Soma 350 Mg Tabs (Carisoprodol) .... Take 1 Tab By Mouth Two Times A Day As Needed For Muscle Spasm... 10)  Flax Seed Oil 1000 Mg Caps (Flaxseed (Linseed)) .... Take 1 Capsule By Mouth Three Times A Day 11)  Omega-3 Chews .... 2 Chews Per Day  Allergies (verified): 1)  ! Prednisone 2)  Vioxx 3)  Sulfa 4)  Norvasc 5)  Seldane  Past History:  Past Medical History: Last updated: 11/08/2008 ALLERGY (ICD-995.3) HYPERTENSION (ICD-401.9) HYPERCHOLESTEROLEMIA, BORDERLINE (ICD-272.4) GERD (ICD-530.81) IRRITABLE BOWEL SYNDROME (ICD-564.1) CONSTIPATION (ICD-564.00) DEGENERATIVE JOINT DISEASE (ICD-715.90) OSTEOPOROSIS (ICD-733.00) HEADACHE (ICD-784.0) ANXIETY (ICD-300.00)  Past Surgical History: Last updated: 11/08/2008 S/P parathyroid adenoma removed S/P Hysterectomy  Family History: Last updated: 06/03/2007 No FH of Colon Cancer: Family History of Diabetes:  Family History of Heart Disease:  leukimia stroke lung cancer  Social History: Last updated: 05/11/2008 Re-married her first husband 1 child never smoked social alcohol retired  Risk Factors: Exercise: yes (06/03/2007)  Risk Factors: Smoking Status: never (10/27/2007)  Review of Systems      See HPI  Vital Signs:  Patient profile:   70 year old female Height:      66.5 inches Weight:      214 pounds BMI:     34.15 O2 Sat:      100 % on Room air Temp:     97.7 degrees F oral Pulse rate:   75 / minute BP sitting:   118 / 74  (left arm) Cuff size:   large  Vitals Entered By: Boone Master CNA (January 30, 2009 2:32 PM)  O2 Flow:  Room air CC: discomfort in back of ribs  Is Patient Diabetic? No Comments Medications reviewed with patient Daytime contact number verified with patient. Boone Master CNA  January 30, 2009 2:32 PM    Physical Exam  Additional Exam:  WD, WN, 70 y/o  BF in NAD... GENERAL:  Alert & oriented; pleasant & cooperative... HEENT:  [place]/AT, EOM-wnl, PERRLA, EACs-clear, TMs-wnl, NOSE-mild red, max tenderness,  THROAT-clear & wnl. NECK:  Supple w/ full ROM; no JVD; normal carotid impulses w/o bruits; no thyromegaly or nodules palpated; no lymphadenopathy. CHEST:  Clear to P & A; without wheezes/ rales/ or rhonchi, tender along right lateral ribs, mid thoracic and low back , no ecchymosis, or rash.  HEART:  Regular Rhythm; without murmurs/ rubs/ or gallops. ABDOMEN:  Soft & nontender; normal bowel sounds; no organomegaly or masses detected, no guarding or rebound. neg CVA tenderness EXT: without deformities, mild arthritic changes; no varicose veins/ venous insuffic/ or edema. nml grips, ext ROM, nml grips.      Impression &  Recommendations:  Problem # 1:  GERD (ICD-530.81) Suspect epigastric discomfort is GERD related.  EKG is nml.  will check labs.  REC:  Increase Nexium 40mg  two times a day for 2 weeks  Add pepcid 20mg  at bedtime  GERD Diet.  Avod lactose for 1 week   Her updated medication list for this problem includes:    Nexium 40 Mg Cpdr (Esomeprazole magnesium) .Marland Kitchen... 1 capsule twice a day 30 minutes before meals  Orders: TLB-CBC Platelet - w/Differential (85025-CBCD) TLB-BMP (Basic Metabolic Panel-BMET) (80048-METABOL) TLB-TSH (Thyroid Stimulating Hormone) (84443-TSH) TLB-Hepatic/Liver Function Pnl (80076-HEPATIC) TLB-Udip w/ Micro (81001-URINE) T-Urine Culture (Spectrum Order) ([id number]) Est. Patient Level IV (96295)  Problem # 2:  DEGENERATIVE JOINT DISEASE (ICD-715.90)  Mid back /rib pain suspect is muscular in nature  REC:  Warm compresses to back as needed  Soma as  needed muscle spasm.  Advil 200mg  3 tabs two times a day with food for  5 days Please contact office for sooner follow up if symptoms do not improve or worsen  follow up Dr. Kriste Basque as scheduled.     Orders: Est. Patient Level IV (28413)  Problem #  3:  ALLERGY (ICD-995.3) Allergic rhinitis flare REC:  Saline nasal rinses as needed  Mucinex two times a day  Increase Nasonex 2 puffs two times a day for 2 weeks.   Medications Added to Medication List This Visit: 1)  Calcium/ Vitamin D 500mg /1000mg  Chew  .... 2 chews per day 2)  Flax Seed Oil 1000 Mg Caps (Flaxseed (linseed)) .... Take 1 capsule by mouth three times a day 3)  Omega-3 Chews  .... 2 chews per day  Complete Medication List: 1)  Aspirin 81 Mg Tbec (Aspirin) .... Take 1 tab daily.Marland KitchenMarland Kitchen 2)  Toprol Xl 100 Mg Tb24 (Metoprolol succinate) .... Take 1 tablet by mouth once a day 3)  Lotrel 5-20 Mg Caps (Amlodipine besy-benazepril hcl) .... Take 1 tablet by mouth once a day 4)  Nexium 40 Mg Cpdr (Esomeprazole magnesium) .Marland Kitchen.. 1 capsule twice a day 30 minutes before meals 5)  Climara 0.075 Mg/24hr Ptwk (Estradiol) .... Apply one patch weekly 6)  Fosamax 70 Mg Tabs (Alendronate sodium) .... Take 1 tablet by mouth every 2 weeks 7)  Calcium/ Vitamin D 500mg /1000mg  Chew  .... 2 chews per day 8)  Alprazolam 0.5 Mg Tabs (Alprazolam) .... Take 1 tab by mouth three times a day as needed for nerves.Marland KitchenMarland Kitchen 9)  Soma 350 Mg Tabs (Carisoprodol) .... Take 1 tab by mouth two times a day as needed for muscle spasm... 10)  Flax Seed Oil 1000 Mg Caps (Flaxseed (linseed)) .... Take 1 capsule by mouth three times a day 11)  Omega-3 Chews  .... 2 chews per day  Patient Instructions: 1)  Increase Nexium 40mg  two times a day for 2 weeks  2)  Add pepcid 20mg  at bedtime  3)  GERD Diet.  4)  Avod lactose for 1 week 5)  Saline nasal rinses as needed  6)  Mucinex two times a day  7)  Increase Nasonex 2 puffs two times a day for 2 weeks.  8)  Warm compresses to back as needed  9)  Soma as  needed muscle spasm.  10)  Advil 200mg  3 tabs two times a day with food for  5 days 11)  Please contact office for sooner follow up if symptoms do not improve or worsen  12)  follow up Dr. Kriste Basque as scheduled.  CardioPerfect ECG  ID: [id number] Patient: Margaret Mitchell, Margaret Mitchell DOB: Dec 04, 1940 Age: 70 Years Old Sex: Female Race: Black Physician: Rubye Oaks, N.P.qq Technician: Boone Master CNA Height: 66.5 Weight: 214 Status: Unconfirmed Past Medical History:  ALLERGY (ICD-995.3) HYPERTENSION (ICD-401.9) HYPERCHOLESTEROLEMIA, BORDERLINE (ICD-272.4) GERD (ICD-530.81) IRRITABLE BOWEL SYNDROME (ICD-564.1) CONSTIPATION (ICD-564.00) DEGENERATIVE JOINT DISEASE (ICD-715.90) OSTEOPOROSIS (ICD-733.00) HEADACHE (ICD-784.0) ANXIETY (ICD-300.00)   Recorded: 01/30/2009 3:16 PM P/PR: 120 ms / 150 ms - Heart rate (maximum exercise) QRS: 88 QT/QTc/QTd: 417 ms / 419 ms / 30 ms - Heart rate (maximum exercise)  P/QRS/T axis: 52 deg / 54 deg / 55 deg - Heart rate (maximum exercise)  Heartrate: 61 bpm  Interpretation:   sinus rhythm  possible LVH   age-corrected Sokolow index (SV1+RV5 or V6) = 3.3 mV   age-corrected vectorial R in extremity leads = 2.7 mV   Borderline ECG

## 2010-02-13 NOTE — Assessment & Plan Note (Signed)
Summary: 37m reck/klw   Primary Care Provider:  Alroy Dust, MD  CC:  6 month ROV & review of mult medical problems....  History of Present Illness: 70 y/o BF here for a follow up visit... she has mult med problems as noted below... she gets her meds from [facility]... she is the daughter of [name]   ~  November 08, 2008:  she has gained 12# up to 214# today & notes some incr SOB & LBP... we discussed diet + exercise... she is handling the stress of caring for mother well... OK flu vacc.   ~  May 08, 2009:  she's had a good 69mo other than stress w/ mother... weight unchanged- exerc on treadmilll 45mi/d, but not really on diet & we discussed this... BP controlled;  GI stable- but she has had persistant RUQ/ right lower CWP & DrPatterson has referred her to Pain Management (Appt pending) & we discssed poss Lyrica trial since the discomfort occurs exclusively at night...   ~  November 07, 2009:  presents c/o sinus infection w/ pressure, HA, green drainage, sneezing, & cough... we discussed Rx w/ Depo, Augmentin, Saline, Mucinex... she also c/o "body cramps" evaluated by DrWainer & she says he put her on "mustard" but it is upsetting her stomach;  she has Soma350 & she is asked to incr this to Tid Prn;  she does describe some noct leg cramps- try tonic water Rx;  finally she reports DrWainer plans cortisone shots in neck (we don't have records)... she had BMD done 5/11 w/ normal TScores & now off Fosamax for drug holiday... she will get the 2011 Flu vaccine when she is over the current infection.   Current Problem List:  Health Maintenance - she takes ASA 81mg /d... her GYN is DrGottsegen- on Premarin 0.625mg /d... Mammograms at [facility] yearly... BMD here 5/11 w/ normal TScores & off fosamax now for drugholiday.  ~  Immunizations: she gets yearly Flu vaccines in the fall... had Pneumovax 2003 at age 23>  repeat PNEUMOVAX given 4/11 age 49.  ALLERGY (ICD-995.3) - Rx w/ Claritin, Saline,  Flonase w/ prev ENT eval by DrShowmaker.  ~  10/11:  seen w/ sinusitis- Rx Augmentin, Depo, Mucinex, Saline...  HYPERTENSION (ICD-401.9) - on ASA 81mg /d, TOPROL XL 100mg /d, & LOTREL 5/20 Daily... BP= 136/88 and similiar at home per pt in the 130/80 range... denies visual changes, CP, palipit, dizziness, syncope, dyspnea, edema, etc... she exercises by walking...  ~  CXR 4/11 was clear, WNL...   HYPERCHOLESTEROLEMIA, BORDERLINE (ICD-272.4) - on diet alone...  ~  FLP 4/10 (wt=203#) showed TChol 209, TG 116, HDL 67, LDL 105  ~  FLP 4/11 (wt=210#) showed TChol 175, TG 138, HDL 61, LDL 87  GERD (ICD-530.81) - on NEXIUM 40mg Bid...  ~  last EGD was 4/07 by DrPatterson showing chr GERD and gastric polyps...   ~  AbdSonar 2007 w/ no gallstones, negative sonar...   ~  HIDA Scan 5/09 was norm w/ eject fract 76% & patent ducts...  IRRITABLE BOWEL SYNDROME (ICD-564.1) -   ~  last colonoscopy 9/07 by DrPatterson showed melanosis only, no other abn...  CONSTIPATION (ICD-564.00) - on Metamucil etc...  DEGENERATIVE JOINT DISEASE (ICD-715.90) - she uses arthritis tylenol as needed...  ~  10/11:  she reports followed by DrWainer w/ cortisone shots in neck planned- currently taking "mustard" for "body cramps" but it upset her stomach; asked to try tonic water for nocturnal leg cramps, and incr SOMA350 Tid Prn muscle spasm.  OSTEOPOROSIS (ICD-733.00) - on Calcium and Vits w/ 1000 VitD... off prev fosamax w/ norm BMD...  ~  BMD 4/09 much improved w/ TScores -0.2 to -0.7.Marland KitchenMarland Kitchen Fosamax decr to [employer].  ~  Vit D level 4/09 = 32... rec> Vit D 1000 u daily...  ~  Vit D level 4/10 = 46... rec> continue same.  ~  5/11:  BMD here showed TScores -0.7 in Spine, and -0.5 in left FemNeck... rec> stop Fosamax for drug holiday.  HEADACHE (ICD-784.0)  ANXIETY (ICD-300.00) - on ALPRAZOLAM 0.5mg  as needed... her husb had an MI, cares for elderly mother.   Preventive Screening-Counseling & Management  Alcohol-Tobacco      Smoking Status: never  Caffeine-Diet-Exercise     Does Patient Exercise: yes  Allergies: 1)  ! Prednisone 2)  Vioxx 3)  Sulfa 4)  Norvasc 5)  Seldane  Comments:  Nurse/Medical Assistant: The patient's medications and allergies were reviewed with the patient and were updated in the Medication and Allergy Lists.  Past History:  Past Medical History: ALLERGY (ICD-995.3) HYPERTENSION (ICD-401.9) HYPERCHOLESTEROLEMIA, BORDERLINE (ICD-272.4) GERD (ICD-530.81) IRRITABLE BOWEL SYNDROME (ICD-564.1) CONSTIPATION (ICD-564.00) DEGENERATIVE JOINT DISEASE (ICD-715.90) OSTEOPOROSIS (ICD-733.00) HEADACHE (ICD-784.0) ANXIETY (ICD-300.00)  Past Surgical History: S/P parathyroid adenoma removed S/P Hysterectomy Tonsillectomy Appendectomy  Family History: Reviewed history from 06/03/2007 and no changes required. No FH of Colon Cancer: Family History of Diabetes:  Family History of Heart Disease:  leukimia stroke lung cancer  Social History: Reviewed history from 04/05/2009 and no changes required. Re-married her first husband 1 child never smoked social alcohol retired Illicit Drug Use - no  Review of Systems      See HPI  The patient denies anorexia, fever, weight loss, weight gain, vision loss, decreased hearing, hoarseness, chest pain, syncope, dyspnea on exertion, peripheral edema, prolonged cough, headaches, hemoptysis, abdominal pain, melena, hematochezia, severe indigestion/heartburn, hematuria, incontinence, muscle weakness, suspicious skin lesions, transient blindness, difficulty walking, depression, unusual weight change, abnormal bleeding, enlarged lymph nodes, and angioedema.    Vital Signs:  Patient profile:   70 year old female Height:      66.5 inches Weight:      208.13 pounds BMI:     33.21 O2 Sat:      100 % on Room air Temp:     97.5 degrees F oral Pulse rate:   66 / minute BP sitting:   136 / 88  (right arm) Cuff size:   regular  Vitals Entered  By: Randell Loop CMA (November 07, 2009 8:49 AM)  O2 Sat at Rest %:  100 O2 Flow:  Room air CC: 6 month ROV & review of mult medical problems... Is Patient Diabetic? No Pain Assessment Patient in pain? yes      Onset of pain  headache Comments meds updated today with pt   Physical Exam  Additional Exam:  WD, WN, 70 y/o BF in NAD... GENERAL:  Alert & oriented; pleasant & cooperative... HEENT:  [place]/AT, EOM-wnl, PERRLA, EACs-clear, TMs-wnl, NOSE- sl red w/ drainage, THROAT-clear & wnl. NECK:  Supple w/ fairROM; no JVD; normal carotid impulses w/o bruits; no thyromegaly or nodules palpated; no lymphadenopathy. CHEST:  Clear to P & A; without wheezes/ rales/ or rhonchi. HEART:  Regular Rhythm; without murmurs/ rubs/ or gallops. ABDOMEN:  Soft & nontender; normal bowel sounds; no organomegaly or masses detected. EXT: without deformities, mild arthritic changes; no varicose veins/ +venous insuffic/ no edema. NEURO:  CN's intact; motor testing normal; sensory testing normal; ?balance off but no specific gait  abn noted. DERM:  No lesions noted; no rash etc...    Impression & Recommendations:  Problem # 1:  SINUSITIS, ACUTE (ICD-461.9) We discussed Rx for acute sinusitis w/ Depo shot, Augmentin Bid, Nasal saline, & Mucinex... Her updated medication list for this problem includes:    Augmentin 875-125 Mg Tabs (Amoxicillin-pot clavulanate) .Marland Kitchen... Take 1 tablet by mouth two times a day until gone  Orders: Depo- Medrol 120mg  Admin of Therapeutic Inj  intramuscular or subcutaneous (45409)  Problem # 2:  HYPERTENSION (ICD-401.9) Controlled on meds>  needs better diet, get wt down! Her updated medication list for this problem includes:    Toprol Xl 100 Mg Tb24 (Metoprolol succinate) .Marland Kitchen... Take 1 tablet by mouth once a day    Lotrel 5-20 Mg Caps (Amlodipine besy-benazepril hcl) .Marland Kitchen... Take 1 tablet by mouth once a day  Problem # 3:  HYPERCHOLESTEROLEMIA, BORDERLINE (ICD-272.4) On diet  alone>  but needs to reduce weight w/ low carb, low fat diet + exercise...  Problem # 4:  GERD (ICD-530.81) Rec to continue Nexium Bid & avoid the mustard! Her updated medication list for this problem includes:    Nexium 40 Mg Cpdr (Esomeprazole magnesium) .Marland Kitchen... 1 capsule twice a day 30 minutes before meals  Problem # 5:  DEGENERATIVE JOINT DISEASE (ICD-715.90) She has DJD & muscle spasm... apparently sched for shots in her neck per DrWainer (we don't have notes)... rec to incr [id number] to Tid for her body cramps, and try tonic water Qhs for noct leg cramps... Her updated medication list for this problem includes:    Aspirin 81 Mg Tbec (Aspirin) .Marland Kitchen... Take 1 tab daily...  Problem # 6:  ANXIETY (ICD-300.00) Continue alpraz Prn nerves... Her updated medication list for this problem includes:    Alprazolam 0.5 Mg Tabs (Alprazolam) .Marland Kitchen... Take 1 tab by mouth three times a day as needed for nerves...  Complete Medication List: 1)  Aspirin 81 Mg Tbec (Aspirin) .... Take 1 tab daily.Marland KitchenMarland Kitchen 2)  Toprol Xl 100 Mg Tb24 (Metoprolol succinate) .... Take 1 tablet by mouth once a day 3)  Lotrel 5-20 Mg Caps (Amlodipine besy-benazepril hcl) .... Take 1 tablet by mouth once a day 4)  Nexium 40 Mg Cpdr (Esomeprazole magnesium) .Marland Kitchen.. 1 capsule twice a day 30 minutes before meals 5)  Premarin 0.625 Mg Tabs (Estrogens conjugated) .... Take 1 tablet by mouth once a day 6)  Soma 350 Mg Tabs (Carisoprodol) .... Take 1 tab by mouth two times a day as needed for muscle spasm... 7)  Calcium 1000 + D 1000-800 Mg-unit Tabs (Calcium carb-cholecalciferol) .... Take 1 tablet by mouth once a day 8)  Multivitamins Tabs (Multiple vitamin) .... Take 1 tablet by mouth once a day 9)  Vitamin D 1000 Unit Tabs (Cholecalciferol) .... Take 1 tablet by mouth once a day 10)  Magnesium 250 Mg Tabs (Magnesium) .... Take 1 tablet by mouth once a day 11)  Omega-3 Chews  .... 2 chews per day 12)  Alprazolam 0.5 Mg Tabs (Alprazolam) .... Take  1 tab by mouth three times a day as needed for nerves... 13)  Augmentin 875-125 Mg Tabs (Amoxicillin-pot clavulanate) .... Take 1 tablet by mouth two times a day until gone  Other Orders: Depo- Medrol 80mg  (J1040) Depo- Medrol 40mg  (J1030)  Patient Instructions: 1)  Today we updated your med list- see below.... 2)  For your sinus infection:  Finish the Augmentin;  increase the MUCINEX to 2 tabs twice daily w/ lots of fluids;  use a Saline nasal mist as needed;  you may also use DELSYM OTC cough syrup as needed...' 3)  We gave you a Depo shot today forthe sinus inflammation.Marland KitchenMarland Kitchen 4)  Please call us for your Flu shot when you are over this acute infection.Marland KitchenMarland Kitchen 5)  Let's get on track w/ outr diet & exercise program- the goal is to lose 10-15 lbs over the next several months... 6)  Please schedule a follow-up appointment in 6 months, w/ fasting blood work at that time.     Medication Administration  Injection # 1:    Medication: Depo- Medrol 80mg     Diagnosis: SINUSITIS, ACUTE (ICD-461.9)    Route: IM    Site: LUOQ gluteus    Exp Date: 06/2012    Lot #: [id number]    Mfr: [employer]    Patient tolerated injection without complications    Given by: Randell Loop CMA (November 07, 2009 9:47 AM)  Injection # 2:    Medication: Depo- Medrol 40mg     Diagnosis: SINUSITIS, ACUTE (ICD-461.9)    Route: IM    Site: LUOQ gluteus    Exp Date: 06/2012    Lot #: [id number]    Mfr: [employer]    Patient tolerated injection without complications    Given by: Randell Loop CMA (November 07, 2009 9:48 AM)  Orders Added: 1)  Est. Patient Level IV [02725] 2)  Depo- Medrol 80mg  [J1040] 3)  Depo- Medrol 40mg  [J1030] 4)  Admin of Therapeutic Inj  intramuscular or subcutaneous [36644]

## 2010-02-13 NOTE — Assessment & Plan Note (Signed)
Summary: Primary svc/  acute ov for cramps, try off ace, Na 130   Primary Provider/Referring Provider:  Alroy Dust, MD  CC:  Cramping in hands and feet x 1 wk.  History of Present Illness: 70 y/o BF  never smokier with   history of HTN, DJD, IBS, and Osteoporosis.      11/27/09-  acute office visit. Cc  prod cough with green mucus, tightness in chest, chills/sweats x43month - currently taking 2nd round of augmentin, began on 4 days ago. Cough is not getting better. Finish Augmentin.  Mucinex DM two times a day as needed cough  Hydromet 1/2-1 tsp every 4-6 hrs as needed cough, may make you sleepy.  Prednisone taper over next week.    December 08, 2009 ov Cramping in hands and feet x 1 wk,  a chronic problem worse x 1 week esp at hs. tried one glass tonic water and lots of gatorade no better.  Pt denies any significant sore throat, dysphagia, itching, sneezing,  nasal congestion or excess secretions,  fever, chills, sweats, unintended wt loss, pleuritic or exertional cp, hempoptysis, change in activity tolerance  orthopnea pnd or leg swelling.   Current Medications (verified): 1)  Aspirin 81 Mg  Tbec (Aspirin) .... Take 1 Tab Daily.Marland KitchenMarland Kitchen 2)  Toprol Xl 100 Mg Tb24 (Metoprolol Succinate) .... Take 1 Tablet By Mouth Once A Day 3)  Lotrel 5-20 Mg Caps (Amlodipine Besy-Benazepril Hcl) .... Take 1 Tablet By Mouth Once A Day 4)  Nexium 40 Mg  Cpdr (Esomeprazole Magnesium) .Marland Kitchen.. 1 Capsule Twice A Day 30 Minutes Before Meals 5)  Premarin 0.625 Mg Tabs (Estrogens Conjugated) .... Take 1 Tablet By Mouth Once A Day 6)  Soma 350 Mg Tabs (Carisoprodol) .... Take 1 Tab By Mouth Two Times A Day As Needed For Muscle Spasm... 7)  Calcium 1000 + D 1000-800 Mg-Unit Tabs (Calcium Carb-Cholecalciferol) .... Take 1 Tablet By Mouth Once A Day 8)  Multivitamins  Tabs (Multiple Vitamin) .... Take 1 Tablet By Mouth Once A Day 9)  Vitamin D 1000 Unit Tabs (Cholecalciferol) .... Take 1 Tablet By Mouth Once A Day 10)   Magnesium 250 Mg Tabs (Magnesium) .... Take 1 Tablet By Mouth Once A Day 11)  Omega-3 Chews .... 2 Chews Per Day 12)  Alprazolam 0.5 Mg  Tabs (Alprazolam) .... Take 1 Tab By Mouth Three Times A Day As Needed For Nerves... 13)  K-99 595 Mg Caps (Potassium Gluconate) .Marland Kitchen.. 1 Once Daily  Allergies (verified): 1)  ! Prednisone 2)  Vioxx 3)  Sulfa 4)  Norvasc 5)  Seldane  Past History:  Past Medical History: ALLERGY (ICD-995.3) HYPERTENSION (ICD-401.9)     - Try off ACE December 08, 2009 for cramping a low na. HYPERCHOLESTEROLEMIA, BORDERLINE (ICD-272.4) GERD (ICD-530.81) IRRITABLE BOWEL SYNDROME (ICD-564.1) CONSTIPATION (ICD-564.00) DEGENERATIVE JOINT DISEASE (ICD-715.90) OSTEOPOROSIS (ICD-733.00) HEADACHE (ICD-784.0) ANXIETY (ICD-300.00)  Vital Signs:  Patient profile:   70 year old female Weight:      210 pounds O2 Sat:      98 % on Room air Temp:     97.3 degrees F oral Pulse rate:   78 / minute BP sitting:   130 / 80  (left arm)  Vitals Entered By: Vernie Murders (December 08, 2009 11:38 AM)  O2 Flow:  Room air  Physical Exam  Additional Exam:  WD, WN, 70 y/o BF in NAD wt 214 > 210 December 08, 2009  GENERAL:  Alert & oriented; pleasant & cooperative... HEENT:  [place]/AT,  EOM-wnl, PERRLA, EACs-clear, TMs-wnl, NOSE-mild red, max tenderness,  THROAT-clear & wnl. NECK:  Supple w/ full ROM; no JVD; normal carotid impulses w/o bruits; no thyromegaly or nodules palpated; no lymphadenopathy. CHEST:  Clear to P & A; without wheezes/ rales HEART:  Regular Rhythm; without murmurs/ rubs/ or gallops. ABDOMEN:  Soft & nontender; normal bowel sounds; no organomegaly or masses detected, no guarding or rebound.   EXT: without deformities, mild arthritic changes; no varicose veins/ venous insuffic/ or edema.    Sodium               [L]  130 mEq/L                   135-145     Rechecked and verified result.   Potassium                 4.1 mEq/L                   3.5-5.1   Chloride                   102 mEq/L                   96-112   Carbon Dioxide            27 mEq/L                    19-32   Glucose              [H]  102 mg/dL                   57-32   BUN                       15 mg/dL                    2-02   Creatinine                1.0 mg/dL                   5.4-2.7   Calcium                   8.8 mg/dL                   0.6-23.7   GFR                       74.43 mL/min                >60  Tests: (2) Hepatic/Liver Function Panel (HEPATIC)   Total Bilirubin           0.4 mg/dL                   6.2-8.3   Direct Bilirubin          0.0 mg/dL                   1.5-1.7   Alkaline Phosphatase      60 U/L                      39-117   AST                       23 U/L  0-37   ALT                       23 U/L                      0-35   Total Protein             6.4 g/dL                    2.0-2.5   Albumin                   3.5 g/dL                    4.2-7.0  Tests: (3) Magnesium (MG)   Magnesium                 1.9 mg/dL                   6.2-3.7  Impression & Recommendations:  Problem # 1:  LEG CRAMPS (ICD-729.82) Na 130 probably from drinking too much to correct cramps vs effect of ace.  Try Azor 5/20 x one month and increase tonic water or find source of quinine.  Problem # 2:  HYPERTENSION (ICD-401.9)  The following medications were removed from the medication list:    Lotrel 5-20 Mg Caps (Amlodipine besy-benazepril hcl) .Marland Kitchen... Take 1 tablet by mouth once a day Her updated medication list for this problem includes:    Toprol Xl 100 Mg Tb24 (Metoprolol succinate) .Marland Kitchen... Take 1 tablet by mouth once a day    Azor 5-20 Mg Tabs (Amlodipine-olmesartan) ..... One daily  Orders: Est. Patient Level IV (62831)  Medications Added to Medication List This Visit: 1)  K-99 595 Mg Caps (Potassium gluconate) .Marland Kitchen.. 1 once daily 2)  Azor 5-20 Mg Tabs (Amlodipine-olmesartan) .... One daily  Other Orders: Influenza Vaccine MCR (51761) TLB-BMP (Basic  Metabolic Panel-BMET) (80048-METABOL) TLB-Hepatic/Liver Function Pnl (80076-HEPATIC) TLB-Magnesium (Mg) (83735-MG)  Patient Instructions: 1)  Try off lotrel 2)  start Azor 5/20 one daily 3)  double quinine water to see if helps the cramps   Immunizations Administered:  Influenza Vaccine # 1:    Vaccine Type: Fluvax MCR    Site: left deltoid    Mfr: GlaxoSmithKline    Dose: 0.5 ml    Route: IM    Given by: Michel Bickers CMA    Exp. Date: 07/14/2010    Lot #: [id number]  Flu Vaccine Consent Questions:    Do you have a history of severe allergic reactions to this vaccine? no    Any prior history of allergic reactions to egg and/or gelatin? no    Do you have a sensitivity to the preservative Thimersol? no    Do you have a past history of Guillan-Barre Syndrome? no    Do you currently have an acute febrile illness? no    Have you ever had a severe reaction to latex? no    Vaccine information given and explained to patient? yes    Are you currently pregnant? no

## 2010-02-13 NOTE — Progress Notes (Signed)
Summary: cramps - appt w/ MW 11.25.11  Phone Note Call from Patient   Caller: Patient Call For: nadel Summary of Call: cramps in legs thighs and hands. [facility]rod[facility] not [facility] [phone] Initial call taken by: Rickard Patience,  December 06, 2009 2:56 PM  Follow-up for Phone Call        called spoke with patient who c/o increased cramping in thighs x6days.  pt states soma is not helping that help x2-3hours, but then it stops working.  has been drinking tonic water and gatoride, taking mustard.  has not tried bananas.  would like blood work done.  appt made with MW 11.25.11 @ 1155.  pt okay with this date and time. Follow-up by: Boone Master CNA/MA,  December 06, 2009 3:13 PM

## 2010-02-13 NOTE — Miscellaneous (Signed)
Summary: BONE DENSITY  Clinical Lists Changes  Orders: Added new Test order of T-Lumbar Vertebral Assessment [phone]) - Signed  Appended Document: bmd results--drug holiday from fosamax  called and spoke with pt per SN---BMD looks good--suggest a drug holiday from the fosamax---pt is to cont the mvi, calcium, and vit d 1000 and weight bearing exercises.  pt voiced her understanding of this and will stop the fosamax. Randell Loop CMA  Jun 07, 2009 12:13 PM   Clinical Lists Changes  Medications: Removed medication of FOSAMAX 70 MG TABS (ALENDRONATE SODIUM) Take 1 tablet by mouth every 2 weeks

## 2010-02-13 NOTE — Progress Notes (Signed)
Summary: prescription  Phone Note Call from Patient Call back at Home Phone [phone]   Caller: Patient Call For: nadel Summary of Call: Pt says her zpak wasn't at the pharmacy when she went to pick up her magic mouthwash rx. Initial call taken by: Darletta Moll,  May 12, 2009 8:16 AM  Follow-up for Phone Call        Spoke with pt's spouse and advised that zpack has now [facility].  Follow-up by: Vernie Murders,  May 12, 2009 8:55 AM    New/Updated Medications: ZITHROMAX Z-PAK 250 MG TABS (AZITHROMYCIN) take as directed Prescriptions: ZITHROMAX Z-PAK 250 MG TABS (AZITHROMYCIN) take as directed  #1 x 0   Entered by:   Vernie Murders   Authorized by:   Michele Mcalpine MD   Signed by:   Vernie Murders on 05/12/2009   Method used:   Electronically to        [facility]. [phone]* (retail)       [address]       [place], [place]  [address]       Ph: [phone]       Fax: [phone]   RxID:   [id number]

## 2010-02-13 NOTE — Assessment & Plan Note (Signed)
Summary: abdomina pain/?gallbladder--ch.    History of Present Illness Visit Type: Initial Visit Primary GI MD: Sheryn Bison MD FACP FAGA Primary Provider: Alroy Dust, MD Chief Complaint: abdominal pain RUQ and loss of appetite History of Present Illness:   70 year old African American female who has Chronic right upper quadrant-right chest wall pain for the last 4-5 years with a negative evaluation including abdominal ultrasound, endoscopy, colonoscopy, chest x-ray, and bone scan. She is referred again for evaluation of this pain which is constant and has a radicular pattern without any relationships to gastrointestinal, cardiac, or genitourinary or spirometry symptomatology. She takes Nexium 40 mg twice a day chronic GERD. She denies a current hepatobiliary, acid reflux, or other gastrointestinal symptomatology or dysphasia. She is followed by Dr.Nadel and primary care and has a history of essential hypertension and constipation predominant IBS. She is status post removal of a parathyroid adenoma, hysterectomy, and appendectomy. She has a history of previous reactions to sulfur medications and Vioxx. She denies anorexia, weight loss, or systemic complaints.   GI Review of Systems    Reports abdominal pain and  nausea.     Location of  Abdominal pain: RUQ.    Denies acid reflux, belching, bloating, chest pain, dysphagia with liquids, dysphagia with solids, heartburn, loss of appetite, vomiting, vomiting blood, weight loss, and  weight gain.        Denies anal fissure, black tarry stools, change in bowel habit, constipation, diarrhea, diverticulosis, fecal incontinence, heme positive stool, hemorrhoids, irritable bowel syndrome, jaundice, light color stool, liver problems, rectal bleeding, and  rectal pain. Preventive Screening-Counseling & Management      Drug Use:  no.      Current Medications (verified): 1)  Aspirin 81 Mg  Tbec (Aspirin) .... Take 1 Tab Daily.Marland KitchenMarland Kitchen 2)  Toprol Xl 100 Mg  Tb24 (Metoprolol Succinate) .... Take 1 Tablet By Mouth Once A Day 3)  Lotrel 5-20 Mg Caps (Amlodipine Besy-Benazepril Hcl) .... Take 1 Tablet By Mouth Once A Day 4)  Nexium 40 Mg  Cpdr (Esomeprazole Magnesium) .Marland Kitchen.. 1 Capsule Twice A Day 30 Minutes Before Meals 5)  Climara 0.075 Mg/24hr Ptwk (Estradiol) .... Apply One Patch Weekly 6)  Fosamax 70 Mg Tabs (Alendronate Sodium) .... Take 1 Tablet By Mouth Every 2 Weeks 7)  Calcium/ Vitamin D 500mg /1000mg  Chew .... 2 Chews Per Day 8)  Alprazolam 0.5 Mg  Tabs (Alprazolam) .... Take 1 Tab By Mouth Three Times A Day As Needed For Nerves.Marland KitchenMarland Kitchen 9)  Soma 350 Mg Tabs (Carisoprodol) .... Take 1 Tab By Mouth Two Times A Day As Needed For Muscle Spasm... 10)  Flax Seed Oil 1000 Mg Caps (Flaxseed (Linseed)) .... Take 1 Capsule By Mouth Three Times A Day 11)  Omega-3 Chews .... 2 Chews Per Day 12)  Fluconazole 150 Mg Tabs (Fluconazole) .... Once Daily  Allergies (verified): 1)  ! Prednisone 2)  Vioxx 3)  Sulfa 4)  Norvasc 5)  Seldane  Past History:  Past medical, surgical, family and social histories (including risk factors) reviewed for relevance to current acute and chronic problems.  Past Medical History: Reviewed history from 11/08/2008 and no changes required. ALLERGY (ICD-995.3) HYPERTENSION (ICD-401.9) HYPERCHOLESTEROLEMIA, BORDERLINE (ICD-272.4) GERD (ICD-530.81) IRRITABLE BOWEL SYNDROME (ICD-564.1) CONSTIPATION (ICD-564.00) DEGENERATIVE JOINT DISEASE (ICD-715.90) OSTEOPOROSIS (ICD-733.00) HEADACHE (ICD-784.0) ANXIETY (ICD-300.00)  Past Surgical History: S/P parathyroid adenoma removed S/P Hysterectomy Tonsillectomy Appendectomy  Family History: Reviewed history from 06/03/2007 and no changes required. No FH of Colon Cancer: Family History of Diabetes:  Family History of  Heart Disease:  leukimia stroke lung cancer  Social History: Reviewed history from 05/11/2008 and no changes required. Re-married her first husband 1  child never smoked social alcohol retired Illicit Drug Use - no Drug Use:  no  Review of Systems       The patient complains of muscle pains/cramps and night sweats.  The patient denies allergy/sinus, anemia, anxiety-new, arthritis/joint pain, back pain, blood in urine, breast changes/lumps, change in vision, confusion, cough, coughing up blood, depression-new, fainting, fatigue, fever, headaches-new, hearing problems, heart murmur, heart rhythm changes, itching, nosebleeds, shortness of breath, skin rash, sleeping problems, sore throat, swelling of feet/legs, swollen lymph glands, thirst - excessive, urination - excessive, urination changes/pain, urine leakage, vision changes, and voice change.    Vital Signs:  Patient profile:   70 year old female Height:      66.5 inches Weight:      210.8 pounds BMI:     33.64 Pulse rate:   68 / minute Pulse rhythm:   regular BP sitting:   142 / 84  (left arm)  Vitals Entered By: Milford Cage NCMA (April 05, 2009 9:20 AM)  Physical Exam  General:  Well developed, well nourished, no acute distress.healthy appearing.   Head:  Normocephalic and atraumatic. Eyes:  PERRLA, no icterus.exam deferred to patient's ophthalmologist.   Neck:  Supple; no masses or thyromegaly. Lungs:  Clear throughout to auscultation. Heart:  Regular rate and rhythm; no murmurs, rubs,  or bruits. Abdomen:  Soft, nontender and nondistended. No masses, hepatosplenomegaly or hernias noted. Normal bowel sounds.obese.   Extremities:  No clubbing, cyanosis, edema or deformities noted. Neurologic:  Alert and  oriented x4;  grossly normal neurologically. Psych:  Alert and cooperative. Normal mood and affect.   Impression & Recommendations:  Problem # 1:  CHEST PAIN (ICD-786.50) Assessment Unchanged Her chest pain originates in the mid thoracic back area and has a radiculopathy pattern into her right mid chest area. She gives no history of previous shingles were none  cardiopulmonary problems. Extensive GI workups have otherwise been negative except for well-controlled GERD. I see no need for repeat GI evaluation at this time. I have referred her to the [facility] in [facility] for consideration of injection therapy for her chronic pain syndrome. She apparently in the past has been intolerant of NSAID therapy.  Problem # 2:  HYPERTENSION (ICD-401.9) Assessment: Improved blood pressure check that today is 142/84, and she been asked to continue all of her other medications as per primary care.  Problem # 3:  GERD (ICD-530.81) Assessment: Improved Continue Reflux regime and daily PPI therapy as tolerated.  Patient Instructions: 1)  Copy sent to : Dr. Alroy Dust 2)  Please continue current medications.  3)  Avoid foods high in acid content ( tomatoes, citrus juices, spicy foods) . Avoid eating within 3 to 4 hours of lying down or before exercising. Do not over eat; try smaller more frequent meals. Elevate head of bed four inches when sleeping.  4)  High Fiber, Low Fat  Healthy Eating Plan brochure given.

## 2010-02-13 NOTE — Progress Notes (Signed)
Summary: rx req/ cough/ sinus---tx with Augmentin  Phone Note Call from Patient Call back at Home Phone [phone]   Caller: Patient Call For: Margaret Mitchell  Summary of Call: pt c/o cough w/ green phlegm x 5 days. also sinus headache. pt has taken musinex and OTC sinus headache. no fever. [facility] Initial call taken by: Tivis Ringer, CNA,  November 02, 2009 12:44 PM  Follow-up for Phone Call        called and spoke with pt.  pt states Sx started 5 days ago.  Pt c/o chest and head congestion, coughing up green sputum, PND, clear nasal drainage, headache, facial pressure, and ear presure.  Pt denied sore throat or fever.  Pt requests abx.  Please advise (FYI- pt states she just finished pred taper last Thurs that was given to her by ortho for neck pain)  Arman Filter LPN  November 02, 2009 2:14 PM  allergies:  1)  ! Prednisone 2)  Vioxx 3)  Sulfa 4)  Norvasc 5)  Seldane  Additional Follow-up for Phone Call Additional follow up Details #1::        per SN----ok for pt to have augmentin 875mg   #14   1 by mouth two times a day until gone and cont the mucinex 2 by mouth two times a day with plenty of fluids. Randell Loop CMA  November 02, 2009 3:20 PM     Additional Follow-up for Phone Call Additional follow up Details #2::    called and spoke with pt. pt aware of SN's recs.  Rx sent to pharmacy.  Aundra Millet Reynolds LPN  November 02, 2009 3:58 PM   New/Updated Medications: AUGMENTIN 875-125 MG TABS (AMOXICILLIN-POT CLAVULANATE) Take 1 tablet by mouth two times a day until gone Prescriptions: AUGMENTIN 875-125 MG TABS (AMOXICILLIN-POT CLAVULANATE) Take 1 tablet by mouth two times a day until gone  #14 x 0   Entered by:   Arman Filter LPN   Authorized by:   Michele Mcalpine MD   Signed by:   Arman Filter LPN on 95/62/1308   Method used:   Telephoned to ...       [facility]. [phone]* (retail)       [address]       [place], [place]  [address]       Ph: [phone]       Fax: [phone]   RxID:   [id number]

## 2010-02-13 NOTE — Progress Notes (Signed)
Summary: sick  Phone Note Call from Patient Call back at [phone]   Caller: Patient Call For: Teandre Hamre Reason for Call: Talk to Nurse Summary of Call: got pna shot on Monday, has no voice today, sore throat and headache, cough.  Please advise something she can take for this. Initial call taken by: Eugene Gavia,  May 11, 2009 1:25 PM  Follow-up for Phone Call        Pt c/o no voice, dry cough, sore throat and headache x 2 days.  Pt denies fever. Please advise. Carron Curie CMA  May 11, 2009 1:57 PM allergies: 1)  ! Prednisone 2)  Vioxx 3)  Sulfa 4)  Norvasc 5)  Seldane  [facility]   Additional Follow-up for Phone Call Additional follow up Details #1::        per SN---ok for pt to have zpak #1 take as directed and mmw  #4oz  1 tsp gargle and swallow four times daily.  this has been called to the pharmacy and lmom for pt to make her aware. Randell Loop CMA  May 11, 2009 5:23 PM     New/Updated Medications: * MMW 1 tsp gargle and swallow four times daily as needed Prescriptions: MMW 1 tsp gargle and swallow four times daily as needed  #4 oz x 1   Entered by:   Randell Loop CMA   Authorized by:   Michele Mcalpine MD   Signed by:   Randell Loop CMA on 05/11/2009   Method used:   Telephoned to ...       [facility]. [phone]* (retail)       [address]       [place] [address], [place]  [address]       Ph: [phone]       Fax: [phone]   RxID:   [id number]

## 2010-02-13 NOTE — Progress Notes (Signed)
Summary: prescripts  Phone Note Call from Patient   Caller: Patient Call For: nadel Summary of Call: pt misplaced prescripts would like metoprolol xl 100mg  and amlod/denaz 5/20mg  90 day supply mail to pt Initial call taken by: Rickard Patience,  March 09, 2009 11:56 AM  Follow-up for Phone Call        Pt has misplaced rx for lotrel and metoprolol. Please advise if ok to reprint. Carron Curie CMA  March 09, 2009 12:15 PM   per SN---ok for these rx to be printed out.  these have been printed out and signed by SN and will place in the mail for the pt. Randell Loop CMA  March 09, 2009 4:33 PM      Prescriptions: LOTREL 5-20 MG CAPS (AMLODIPINE BESY-BENAZEPRIL HCL) Take 1 tablet by mouth once a day  #90 x 3   Entered by:   Randell Loop CMA   Authorized by:   Michele Mcalpine MD   Signed by:   Randell Loop CMA on 03/09/2009   Method used:   Print then Give to Patient   RxID:   [id number] TOPROL XL 100 MG TB24 (METOPROLOL SUCCINATE) Take 1 tablet by mouth once a day  #90 x 3   Entered by:   Randell Loop CMA   Authorized by:   Michele Mcalpine MD   Signed by:   Randell Loop CMA on 03/09/2009   Method used:   Print then Give to Patient   RxID:   [id number]

## 2010-02-13 NOTE — Progress Notes (Signed)
Summary: congestion/ nasal drip > augmentin, mucinex  Phone Note Call from Patient   Caller: Patient Call For: Margaret Mitchell Summary of Call: green congestion and nasal drip. [facility] Initial call taken by: Rickard Patience,  November 22, 2009 11:16 AM  Follow-up for Phone Call        Pt staets she was recently treated for a sinus infection in October and she was feeling better, but then over the weekend they painted her house and she bagan to have a productive cough with green phlegm, hoarsness, chest congestion, and nasal drainage. Please advise.Carron Curie CMA  November 22, 2009 11:19 AM allergies:  ! Prednisone 2)  Vioxx 3)  Sulfa 4)  Norvasc 5)  Seldane  Additional Follow-up for Phone Call Additional follow up Details #1::        per SN---rec for augmentin 875mg   #20  1 by mouth two times a day until gone and mucinex 600mg    2 by mouth two times a day with plenty of fluids. thanks Randell Loop CMA  November 22, 2009 2:03 PM  Called, spoke with pt.  She was informed of above recs per SN and aware abx sent to [facility].  She verbalized understanding.   Additional Follow-up by: Gweneth Dimitri RN,  November 22, 2009 2:22 PM    New/Updated Medications: AUGMENTIN 875-125 MG TABS (AMOXICILLIN-POT CLAVULANATE) Take 1 tablet by mouth two times a day until gone Prescriptions: AUGMENTIN 875-125 MG TABS (AMOXICILLIN-POT CLAVULANATE) Take 1 tablet by mouth two times a day until gone  #20 x 0   Entered by:   Gweneth Dimitri RN   Authorized by:   Michele Mcalpine MD   Signed by:   Gweneth Dimitri RN on 11/22/2009   Method used:   Electronically to        [facility]. [phone]* (retail)       [address]       [place], [place]  [address]       Ph: [phone]       Fax: [phone]   RxID:   [id number]

## 2010-02-15 NOTE — Progress Notes (Signed)
Summary: PA for Azor> changed to losartan potassium and amlodipine  Phone Note Outgoing Call   Call placed by: Michel Bickers CMA,  January 23, 2010 9:56 AM Call placed to: [facility] [phone] Summary of Call: PA needed for Azor 5-20mg  tablet.  Patient ID# is [id number].  Dr. Kriste Basque patient. Patient must first try agents containing Losartan or Telmisartan or Valsartan. Please advise if one of the alternative medications would be appropriate for this patient or to do PA.  Initial call taken by: Michel Bickers CMA,  January 23, 2010 10:10 AM  Follow-up for Phone Call        per SN----she was started on this med by MW---can change med to amlodipine 5mg    #30 or #90   1 by mouth once daily  and losartan 100mg    #30 or #90  1 by mouth once daily  and refill x 6.  thanksAdkins CMA  January 23, 2010 2:28 PM   Additional Follow-up for Phone Call Additional follow up Details #1::        LMOMTCB Vernie Murders  January 23, 2010 3:07 PM pt returned call from triage. call cell [phone]. Tivis Ringer, CNA  January 23, 2010 4:49 PM    Additional Follow-up for Phone Call Additional follow up Details #2::    Patient returned Leslie's call and would like to be called back at [phone]. Follow-up by: Leonette Monarch,  January 23, 2010 4:02 PM  Additional Follow-up for Phone Call Additional follow up Details #3:: Details for Additional Follow-up Action Taken: Spoke with pt and advised of the above recs per SN.  She states that she does not wish to take two more meds for her BP.  She states that she would just like 1 pill to control her BP.  Will forward back to SN.  Pls advise thanks Vernie Murders  January 23, 2010 4:07 PM  lm for pt to return the call----per SN---this is from her insurance company--she can have the rx for the azor but she will need to pay out of pocket for this or she can change to the 2 meds stated above per SN which will be much cheaper per her insurance company Randell Loop CMA   January 23, 2010 4:31 PM   Pt states she will take the 2 meds. She requests a 30 day [facility] to [facility] and then a 90 day rx be printed so she can this to [facility]. pt states to leave Rx at front and she will pick it up tomorrow.  Additional Follow-up by: Carron Curie CMA,  January 25, 2010 4:01 PM  New/Updated Medications: AMLODIPINE BESYLATE 5 MG TABS (AMLODIPINE BESYLATE) Take 1 tablet by mouth once a day LOSARTAN POTASSIUM 100 MG TABS (LOSARTAN POTASSIUM) Take 1 tablet by mouth once a day Prescriptions: LOSARTAN POTASSIUM 100 MG TABS (LOSARTAN POTASSIUM) Take 1 tablet by mouth once a day  #90 x 3   Entered by:   Carron Curie CMA   Authorized by:   Michele Mcalpine MD   Signed by:   Carron Curie CMA on 01/25/2010   Method used:   Print then Give to Patient   RxID:   [id number] AMLODIPINE BESYLATE 5 MG TABS (AMLODIPINE BESYLATE) Take 1 tablet by mouth once a day  #90 x 3   Entered by:   Carron Curie CMA   Authorized by:   Michele Mcalpine MD   Signed by:   Carron Curie CMA on 01/25/2010   Method  used:   Print then Give to Patient   RxID:   [id number] LOSARTAN POTASSIUM 100 MG TABS (LOSARTAN POTASSIUM) Take 1 tablet by mouth once a day  #30 x 0   Entered by:   Carron Curie CMA   Authorized by:   Michele Mcalpine MD   Signed by:   Carron Curie CMA on 01/25/2010   Method used:   Electronically to        [facility]. [phone]* (retail)       [address]       [place], [place]  [address]       Ph: [phone]       Fax: [phone]   RxID:   [id number] AMLODIPINE BESYLATE 5 MG TABS (AMLODIPINE BESYLATE) Take 1 tablet by mouth once a day  #30 x 0   Entered by:   Carron Curie CMA   Authorized by:   Michele Mcalpine MD   Signed by:   Carron Curie CMA on 01/25/2010   Method used:   Electronically to        [facility]. [phone]* (retail)       [address]       [place], [place]  [address]       Ph: [phone]       Fax: [phone]   RxID:   [id number]

## 2010-02-15 NOTE — Assessment & Plan Note (Signed)
Summary: NP follow up - leg cramps   Primary Provider/Referring Provider:  Alroy Dust, MD  CC:  1 month follow up for hand and leg cramps, states no change with drinking tonic water 8x daily.  was given robaxin by Dr. Thurston Hole, and states doesn't help.  History of Present Illness: 70 y/o BF  never smokier with   history of HTN, DJD, IBS, and Osteoporosis.   11/27/09-  acute office visit. Cc  prod cough with green mucus, tightness in chest, chills/sweats x71month - currently taking 2nd round of augmentin, began on 4 days ago. Cough is not getting better. Finish Augmentin.  Mucinex DM two times a day as needed cough  Hydromet 1/2-1 tsp every 4-6 hrs as needed cough, may make you sleepy.  Prednisone taper over next week.   December 08, 2009 ov Cramping in hands and feet x 1 wk,  a chronic problem worse x 1 week esp at hs. tried one glass tonic water and lots of gatorade no better.    January 10, 2010 --Presents for 1 month follow up for hand and leg cramps, states no change with drinking tonic water 8x daily.  was given robaxin by Dr. Thurston Hole, states doesn't help. Lotrel changed to AZOR last ov. Tolerating well. Labs showed Na+ at 130 -recs to decrease fluid intake. Denies chest pain,  orthopnea, hemoptysis, fever, n/v/d, edema, headache. Cramps are worse prior to going to bed.   Medications Prior to Update: 1)  Aspirin 81 Mg  Tbec (Aspirin) .... Take 1 Tab Daily.Marland KitchenMarland Kitchen 2)  Toprol Xl 100 Mg Tb24 (Metoprolol Succinate) .... Take 1 Tablet By Mouth Once A Day 3)  Nexium 40 Mg  Cpdr (Esomeprazole Magnesium) .Marland Kitchen.. 1 Capsule Twice A Day 30 Minutes Before Meals 4)  Premarin 0.625 Mg Tabs (Estrogens Conjugated) .... Take 1 Tablet By Mouth Once A Day 5)  Soma 350 Mg Tabs (Carisoprodol) .... Take 1 Tab By Mouth Two Times A Day As Needed For Muscle Spasm... 6)  Calcium 1000 + D 1000-800 Mg-Unit Tabs (Calcium Carb-Cholecalciferol) .... Take 1 Tablet By Mouth Once A Day 7)  Multivitamins  Tabs (Multiple Vitamin)  .... Take 1 Tablet By Mouth Once A Day 8)  Vitamin D 1000 Unit Tabs (Cholecalciferol) .... Take 1 Tablet By Mouth Once A Day 9)  Magnesium 250 Mg Tabs (Magnesium) .... Take 1 Tablet By Mouth Once A Day 10)  Omega-3 Chews .... 2 Chews Per Day 11)  Alprazolam 0.5 Mg  Tabs (Alprazolam) .... Take 1 Tab By Mouth Three Times A Day As Needed For Nerves... 12)  K-99 595 Mg Caps (Potassium Gluconate) .Marland Kitchen.. 1 Once Daily 13)  Azor 5-20 Mg Tabs (Amlodipine-Olmesartan) .... One Daily  Current Medications (verified): 1)  Aspirin 81 Mg  Tbec (Aspirin) .... Take 1 Tab Daily.Marland KitchenMarland Kitchen 2)  Toprol Xl 100 Mg Tb24 (Metoprolol Succinate) .... Take 1 Tablet By Mouth Once A Day 3)  Nexium 40 Mg  Cpdr (Esomeprazole Magnesium) .Marland Kitchen.. 1 Capsule Twice A Day 30 Minutes Before Meals 4)  Premarin 0.625 Mg Tabs (Estrogens Conjugated) .... Take 1 Tablet By Mouth Once A Day 5)  Soma 350 Mg Tabs (Carisoprodol) .... Take 1 Tab By Mouth Two Times A Day As Needed For Muscle Spasm... 6)  Calcium 1000 + D 1000-800 Mg-Unit Tabs (Calcium Carb-Cholecalciferol) .... Take 1 Tablet By Mouth Once A Day 7)  Multivitamins  Tabs (Multiple Vitamin) .... Take 1 Tablet By Mouth Once A Day 8)  Vitamin D 1000 Unit Tabs (Cholecalciferol) .Marland KitchenMarland KitchenMarland Kitchen  Take 1 Tablet By Mouth Once A Day 9)  Magnesium 250 Mg Tabs (Magnesium) .... Take 1 Tablet By Mouth Once A Day 10)  Omega-3 Chews .... 2 Chews Per Day 11)  Alprazolam 0.5 Mg  Tabs (Alprazolam) .... Take 1 Tab By Mouth Three Times A Day As Needed For Nerves... 12)  K-99 595 Mg Caps (Potassium Gluconate) .Marland Kitchen.. 1 Once Daily 13)  Azor 5-20 Mg Tabs (Amlodipine-Olmesartan) .... One Daily 14)  Methocarbamol 500 Mg Tabs (Methocarbamol) .Marland Kitchen.. 1 Tablet Every 4-6 Hours As Needed Muscle Spasm  Allergies (verified): 1)  ! Prednisone 2)  Vioxx 3)  Sulfa 4)  Norvasc 5)  Seldane  Past History:  Past Medical History: Last updated: 12/08/2009 ALLERGY (ICD-995.3) HYPERTENSION (ICD-401.9)     - Try off ACE December 08, 2009 for  cramping a low na. HYPERCHOLESTEROLEMIA, BORDERLINE (ICD-272.4) GERD (ICD-530.81) IRRITABLE BOWEL SYNDROME (ICD-564.1) CONSTIPATION (ICD-564.00) DEGENERATIVE JOINT DISEASE (ICD-715.90) OSTEOPOROSIS (ICD-733.00) HEADACHE (ICD-784.0) ANXIETY (ICD-300.00)  Past Surgical History: Last updated: 11/07/2009 S/P parathyroid adenoma removed S/P Hysterectomy Tonsillectomy Appendectomy  Family History: Last updated: 06/03/2007 No FH of Colon Cancer: Family History of Diabetes:  Family History of Heart Disease:  leukimia stroke lung cancer  Social History: Last updated: 04/05/2009 Re-married her first husband 1 child never smoked social alcohol retired Illicit Drug Use - no  Risk Factors: Smoking Status: never (11/07/2009)  Review of Systems      See HPI  Vital Signs:  Patient profile:   70 year old female Height:      66.5 inches Weight:      216.50 pounds BMI:     34.54 O2 Sat:      100 % on Room air Temp:     97.8 degrees F oral Pulse rate:   79 / minute BP sitting:   132 / 86  (left arm) Cuff size:   large  Vitals Entered By: Boone Master CNA/MA (January 10, 2010 10:44 AM)  O2 Flow:  Room air CC: 1 month follow up for hand and leg cramps, states no change with drinking tonic water 8x daily.  was given robaxin by Dr. Thurston Hole, states doesn't help Is Patient Diabetic? No Comments Medications reviewed with patient Daytime contact number verified with patient. Boone Master CNA/MA  January 10, 2010 10:44 AM    Physical Exam  Additional Exam:  WD, WN, 70 y/o BF in NAD wt 214 > 210 December 08, 2009 >>216 01/10/10 GENERAL:  Alert & oriented; pleasant & cooperative... HEENT:  [place]/AT, EOM-wnl, PERRLA, EACs-clear, TMs-wnl, NOSE-mild red, max tenderness,  THROAT-clear & wnl. NECK:  Supple w/ full ROM; no JVD; normal carotid impulses w/o bruits; no thyromegaly or nodules palpated; no lymphadenopathy. CHEST:  Clear to P & A; without wheezes/ rales HEART:  Regular  Rhythm; without murmurs/ rubs/ or gallops. ABDOMEN:  Soft & nontender; normal bowel sounds; no organomegaly or masses detected, no guarding or rebound.   EXT: without deformities, mild arthritic changes; no varicose veins/ venous insuffic/ or edema.     Impression & Recommendations:  Problem # 1:  LEG CRAMPS (ICD-729.82)  We discussed several options w/ heat, stretching, etc.  May decrease water /tonic water intake. she declines ginko biloba.  Plan:  Continue on current regimen.  follow up Dr. Sherene Sires in 4 months and as needed  Warm heat to back, legs and feet prior to bed  Please contact office for sooner follow up if symptoms do not improve or worsen   Orders: Est. Patient Level III (  59563)  Problem # 2:  HYPERTENSION (ICD-401.9)  controlled on rx, tolerating new change to Azor.  Her updated medication list for this problem includes:    Toprol Xl 100 Mg Tb24 (Metoprolol succinate) .Marland Kitchen... Take 1 tablet by mouth once a day    Azor 5-20 Mg Tabs (Amlodipine-olmesartan) ..... One daily  BP today: 132/86 Prior BP: 130/80 (12/08/2009)  Labs Reviewed: K+: 4.1 (12/08/2009) Creat: : 1.0 (12/08/2009)   Chol: 175 (05/09/2009)   HDL: 60.80 (05/09/2009)   LDL: 87 (05/09/2009)   TG: 138.0 (05/09/2009)  Orders: Est. Patient Level III (87564)  Medications Added to Medication List This Visit: 1)  Methocarbamol 500 Mg Tabs (Methocarbamol) .Marland Kitchen.. 1 tablet every 4-6 hours as needed muscle spasm  Patient Instructions: 1)  Continue on current regimen.  2)  follow up Dr. Sherene Sires in 4 months and as needed  3)  Warm heat to back, legs and feet prior to bed  4)  Please contact office for sooner follow up if symptoms do not improve or worsen  Prescriptions: AZOR 5-20 MG TABS (AMLODIPINE-OLMESARTAN) one daily  #30 x 5   Entered and Authorized by:   Rubye Oaks NP   Signed by:   Rubye Oaks NP on 01/10/2010   Method used:   Electronically to        [facility]. [phone]* (retail)        [address]       [place], [place]  [address]       Ph: [phone]       Fax: [phone]   RxID:   [id number]

## 2010-02-15 NOTE — Progress Notes (Signed)
Summary: prescription-AZOR Rx-gave verbal to pharmacy  Phone Note Call from Patient Call back at Home Phone [phone]   Caller: Patient Call For: [facility] PARRETT Summary of Call: Patient phoned stated that she saw Tammy Parrett on 12/28 and she was going to send a prescription for her BP medication Azor 5/20 to [facility] [phone] and she stated that [facility] stated that they have not received it yet. Patient can be reached at [phone] Initial call taken by: Vedia Coffer,  January 17, 2010 10:31 AM  Follow-up for Phone Call        I spoke with the pharmacy-they did not get electronic Rx so I gave a verbal order and pt is aware that [facility] has the Rx now for her to pick up.Reynaldo Minium CMA  January 17, 2010 10:51 AM

## 2010-02-15 NOTE — Progress Notes (Signed)
Summary: pt out of bp meds  Phone Note Call from Patient   Caller: Patient Call For: nadel Summary of Call: pt has 1 tablet remaining of her bp med. she says [facility] on [address] told her they needed prior auth for AZOR. pt wants to know what she is to do in the meantime. CELL # [phone] Initial call taken by: Tivis Ringer, CNA,  January 18, 2010 4:50 PM  Follow-up for Phone Call        Spoke with pt and notified her of the PA process and advised that we will notify her once we have went through that process and get answer as to whether med will be covered and in the meantime I am leaving her 2 wks worth samples of med.  Pt okay with this and verbalized understanding. Follow-up by: Vernie Murders,  January 18, 2010 5:22 PM

## 2010-02-15 NOTE — Letter (Signed)
Summary: [facility]  [facility]   Imported By: Sherian Rein 01/04/2010 12:03:16  _____________________________________________________________________  External Attachment:    Type:   Image     Comment:   External Document

## 2010-02-19 ENCOUNTER — Telehealth (INDEPENDENT_AMBULATORY_CARE_PROVIDER_SITE_OTHER): Payer: Self-pay | Admitting: *Deleted

## 2010-03-01 NOTE — Progress Notes (Signed)
Summary: Possible reaction to medication  Phone Note Call from Patient Call back at Home Phone [phone]   Caller: Patient Call For: Kriste Basque Summary of Call: Patient started taking Norvasc with Cozaar on 02/10/10.  Since then feet and legs have felt "like they're in hot water" and ankles swell in evening.  Patient's pharmacy is [facility], [phone]. Initial call taken by: Leonette Monarch,  February 19, 2010 9:18 AM  Follow-up for Phone Call        Spoke with pt.  She states that since started norvasc she is having swelling in her feet and ankles and states that her feet feel hot, "like they are in hot water".  Pls advise thanks! Follow-up by: Vernie Murders,  February 19, 2010 10:12 AM  Additional Follow-up for Phone Call Additional follow up Details #1::        per dr Kriste Basque stop the amlodopine if she thinks it's causing reaction, use losarten until ov on 2/23 at 10:30am pt aware and fine with this  Additional Follow-up by: Philipp Deputy CMA,  February 19, 2010 12:22 PM

## 2010-03-08 ENCOUNTER — Ambulatory Visit (INDEPENDENT_AMBULATORY_CARE_PROVIDER_SITE_OTHER): Payer: Medicare Other | Admitting: Pulmonary Disease

## 2010-03-08 ENCOUNTER — Encounter: Payer: Self-pay | Admitting: Pulmonary Disease

## 2010-03-08 DIAGNOSIS — M1990 Unspecified osteoarthritis, unspecified site: Secondary | ICD-10-CM

## 2010-03-08 DIAGNOSIS — I10 Essential (primary) hypertension: Secondary | ICD-10-CM

## 2010-03-08 DIAGNOSIS — K5900 Constipation, unspecified: Secondary | ICD-10-CM

## 2010-03-08 DIAGNOSIS — R252 Cramp and spasm: Secondary | ICD-10-CM

## 2010-03-08 DIAGNOSIS — K589 Irritable bowel syndrome without diarrhea: Secondary | ICD-10-CM

## 2010-03-08 DIAGNOSIS — K219 Gastro-esophageal reflux disease without esophagitis: Secondary | ICD-10-CM

## 2010-03-08 DIAGNOSIS — M810 Age-related osteoporosis without current pathological fracture: Secondary | ICD-10-CM

## 2010-03-08 DIAGNOSIS — E785 Hyperlipidemia, unspecified: Secondary | ICD-10-CM

## 2010-03-21 ENCOUNTER — Telehealth (INDEPENDENT_AMBULATORY_CARE_PROVIDER_SITE_OTHER): Payer: Self-pay | Admitting: *Deleted

## 2010-03-27 NOTE — Assessment & Plan Note (Signed)
Summary: discuss meds   Primary Care Provider:  Alroy Dust, MD  CC:  4 month ROV & review of mult problems....  History of Present Illness: 70 y/o BF here for a follow up visit... Margaret Mitchell has mult med problems as noted below... Margaret Mitchell gets her meds from [facility]... Margaret Mitchell is the daughter of [name].   ~  November 07, 2009:  presents c/o sinus infection w/ pressure, HA, green drainage, sneezing, & cough... we discussed Rx w/ Depo, Augmentin, Saline, Mucinex... Margaret Mitchell also c/o "body cramps" evaluated by DrWainer & Margaret Mitchell says he put her on "mustard" but it is upsetting her stomach;  Margaret Mitchell has Soma350 & Margaret Mitchell is asked to incr this to Tid Prn;  Margaret Mitchell does describe some noct leg cramps- try tonic water Rx;  finally Margaret Mitchell reports DrWainer plans cortisone shots in neck (we don't have records)... Margaret Mitchell had BMD done 5/11 w/ normal TScores & now off Fosamax for drug holiday... Margaret Mitchell will get the 2011 Flu vaccine when Margaret Mitchell is over the current infection.   ~  March 08, 2010:    Margaret Mitchell was seen 11/11 by DrWert in my absence c/o cramps in her hands and feet> labs OK x Na=130 & BP was 130/80 so he switched Lotrel5-20 to Azor5-20, & rec incr Tonic water;  f/u TP 12/11 w/ persist cramps (no improvement w/ tonic water or Robaxin given by DrWainer) & rec to do stretching exercises;  then several phone calls> insurance refused Azor unless tried Losartan etc, so changed to Amlodip + Losartan but it made her feet feel "like they were in hot water";  after all this we decided to change back to the Lotrel 5-20 which Margaret Mitchell tolerated well & apparently had nothing to do w/ her cramps which Margaret Mitchell describes as a "lifetime prob" & does best w/ Alpraz Qhs...    Margaret Mitchell has a physical sched in April> due for fasting labs then as well...   Current Problem List:  Health Maintenance - Margaret Mitchell takes ASA 81mg /d... her GYN is DrGottsegen- on Premarin 0.625mg /d... Mammograms at [facility] yearly... BMD here 5/11 w/ normal TScores & off fosamax now for  drugholiday.  ~  Immunizations: Margaret Mitchell gets yearly Flu vaccines in the fall... had Pneumovax 2003 at age 54>  repeat PNEUMOVAX given 4/11 age 47.  ALLERGY (ICD-995.3) - Rx w/ Claritin, Saline, Flonase w/ prev ENT eval by DrShowmaker.  ~  10/11:  seen w/ sinusitis- Rx Augmentin, Depo, Mucinex, Saline...  HYPERTENSION (ICD-401.9) - on ASA 81mg /d, TOPROL XL 100mg /d, & LOTREL 5/20 Daily... BP= 136/88 and similiar at home per pt in the 130/80 range... denies visual changes, CP, palipit, dizziness, syncope, dyspnea, edema, etc... Margaret Mitchell exercises by walking...  ~  CXR 4/11 was clear, WNL...   HYPERCHOLESTEROLEMIA, BORDERLINE (ICD-272.4) - on diet alone...  ~  FLP 4/10 (wt=203#) showed TChol 209, TG 116, HDL 67, LDL 105  ~  FLP 4/11 (wt=210#) showed TChol 175, TG 138, HDL 61, LDL 87  GERD (ICD-530.81) - on NEXIUM 40mg Bid...  ~  last EGD was 4/07 by DrPatterson showing chr GERD and gastric polyps...   ~  AbdSonar 2007 w/ no gallstones, negative sonar...   ~  HIDA Scan 5/09 was norm w/ eject fract 76% & patent ducts...  IRRITABLE BOWEL SYNDROME (ICD-564.1) -   ~  last colonoscopy 9/07 by DrPatterson showed melanosis only, no other abn...  CONSTIPATION (ICD-564.00) - on Metamucil etc...  DEGENERATIVE JOINT DISEASE (ICD-715.90) - Margaret Mitchell uses arthritis tylenol as needed...  ~  10/11:  Margaret Mitchell reports followed by DrWainer w/ cortisone shots in neck planned- currently taking "mustard" for "body cramps" but it upset her stomach; asked to try tonic water for nocturnal leg cramps, and incr SOMA350 Tid Prn muscle spasm.  OSTEOPOROSIS (ICD-733.00) - on Calcium and Vits w/ 1000 VitD... off prev fosamax w/ norm BMD...  ~  BMD 4/09 much improved w/ TScores -0.2 to -0.7.Marland KitchenMarland Kitchen Fosamax decr to [employer].  ~  Vit D level 4/09 = 32... rec> Vit D 1000 u daily...  ~  Vit D level 4/10 = 46... rec> continue same.  ~  5/11:  BMD here showed TScores -0.7 in Spine, and -0.5 in left FemNeck... rec> stop Fosamax for drug  holiday.  HEADACHE (ICD-784.0)  ANXIETY (ICD-300.00) - on ALPRAZOLAM 0.5mg  as needed... her husb had an MI, cares for elderly mother.   Preventive Screening-Counseling & Management  Alcohol-Tobacco     Smoking Status: never  Caffeine-Diet-Exercise     Does Patient Exercise: yes  Allergies: 1)  ! Prednisone 2)  Vioxx 3)  Sulfa 4)  Norvasc 5)  Seldane  Comments:  Nurse/Medical Assistant: The patient's medications and allergies were reviewed with the patient and were updated in the Medication and Allergy Lists.  Past History:  Past Medical History: ALLERGY (ICD-995.3) HYPERTENSION (ICD-401.9)     - Try off ACE December 08, 2009 for cramping a low na. HYPERCHOLESTEROLEMIA, BORDERLINE (ICD-272.4) GERD (ICD-530.81) IRRITABLE BOWEL SYNDROME (ICD-564.1) CONSTIPATION (ICD-564.00) DEGENERATIVE JOINT DISEASE (ICD-715.90) OSTEOPOROSIS (ICD-733.00) HEADACHE (ICD-784.0) ANXIETY (ICD-300.00)  Past Surgical History: S/P parathyroid adenoma removed S/P Hysterectomy Tonsillectomy Appendectomy  Family History: Reviewed history from 06/03/2007 and no changes required. No FH of Colon Cancer: Family History of Diabetes:  Family History of Heart Disease:  leukimia stroke lung cancer  Social History: Reviewed history from 04/05/2009 and no changes required. Re-married her first husband 1 child never smoked social alcohol retired Illicit Drug Use - no  Review of Systems      See HPI       The patient complains of dyspnea on exertion.  The patient denies anorexia, fever, weight loss, weight gain, vision loss, decreased hearing, hoarseness, chest pain, syncope, peripheral edema, prolonged cough, headaches, hemoptysis, abdominal pain, melena, hematochezia, severe indigestion/heartburn, hematuria, incontinence, muscle weakness, suspicious skin lesions, transient blindness, difficulty walking, depression, unusual weight change, abnormal bleeding, enlarged lymph nodes, and  angioedema.    Vital Signs:  Patient profile:   70 year old female Height:      66.5 inches Weight:      215.38 pounds O2 Sat:      98 % on Room air Temp:     97.4 degrees F oral Pulse rate:   67 / minute BP sitting:   136 / 72  (left arm) Cuff size:   regular  Vitals Entered By: Randell Loop CMA (March 08, 2010 10:22 AM)  O2 Sat at Rest %:  98 O2 Flow:  Room air CC: 4 month ROV & review of mult problems... Is Patient Diabetic? No Pain Assessment Patient in pain? no      Comments meds updated today with pt   Physical Exam  Additional Exam:  WD, WN, 70 y/o BF in NAD... GENERAL:  Alert & oriented; pleasant & cooperative... HEENT:  [place]/AT, EOM-wnl, PERRLA, EACs-clear, TMs-wnl, NOSE- sl red w/ drainage, THROAT-clear & wnl. NECK:  Supple w/ fairROM; no JVD; normal carotid impulses w/o bruits; no thyromegaly or nodules palpated; no lymphadenopathy. CHEST:  Clear to P & A; without  wheezes/ rales/ or rhonchi. HEART:  Regular Rhythm; without murmurs/ rubs/ or gallops. ABDOMEN:  Soft & nontender; normal bowel sounds; no organomegaly or masses detected. EXT: without deformities, mild arthritic changes; no varicose veins/ +venous insuffic/ no edema. NEURO:  CN's intact; motor testing normal; sensory testing normal; ?balance off but no specific gait abn noted. DERM:  No lesions noted; no rash etc...    Impression & Recommendations:  Problem # 1:  HYPERTENSION (ICD-401.9) Margaret Mitchell would like to restart her prev BP med rgimen> Toprol & Lotrel... The following medications were removed from the medication list:    Amlodipine Besylate 5 Mg Tabs (Amlodipine besylate) .Marland Kitchen... Take 1 tablet by mouth once a day Her updated medication list for this problem includes:    Toprol Xl 100 Mg Tb24 (Metoprolol succinate) .Marland Kitchen... Take 1 tablet by mouth once a day    Lotrel 5-20 Mg Caps (Amlodipine besy-benazepril hcl) .Marland Kitchen... Take 1 cap by mouth once daily...  Problem # 2:  LEG CRAMPS (ICD-729.82) Margaret Mitchell  states this has been a lifelong problem & does best w/ Alpraz Qhs... we reviewed her recent hx & med adjustments and trials by DrWainer/ DrWert/ TP...  Problem # 3:  GI >>> Margaret Mitchell has GERD, IBS, Constip>  and abd discomfort improved w/ normalizing her BMs etc...  Problem # 4:  DEGENERATIVE JOINT DISEASE (ICD-715.90) Followed by DrWainer Dortha Schwalbe al... Her updated medication list for this problem includes:    Aspirin 81 Mg Tbec (Aspirin) .Marland Kitchen... Take 1 tab daily...  Complete Medication List: 1)  Aspirin 81 Mg Tbec (Aspirin) .... Take 1 tab daily.Marland KitchenMarland Kitchen 2)  Toprol Xl 100 Mg Tb24 (Metoprolol succinate) .... Take 1 tablet by mouth once a day 3)  Lotrel 5-20 Mg Caps (Amlodipine besy-benazepril hcl) .... Take 1 cap by mouth once daily.Marland KitchenMarland Kitchen 4)  Fish Oil 1000 Mg Caps (Omega-3 fatty acids) .... Take as directed... 5)  Nexium 40 Mg Cpdr (Esomeprazole magnesium) .Marland Kitchen.. 1 capsule twice a day 30 minutes before meals 6)  Fiber 625 Mg Tabs (Calcium polycarbophil) .... Take 2 tables by mouth once daily 7)  Premarin 0.625 Mg Tabs (Estrogens conjugated) .... Take 1 tablet by mouth once a day 8)  Calcium 1000 + D 1000-800 Mg-unit Tabs (Calcium carb-cholecalciferol) .... Take 1 tablet by mouth once a day 9)  Multivitamins Tabs (Multiple vitamin) .... Take 1 tablet by mouth once a day 10)  Vitamin D 1000 Unit Tabs (Cholecalciferol) .... Take 1 tablet by mouth once a day 11)  Alprazolam 0.5 Mg Tabs (Alprazolam) .... Take 1 tab by mouth three times a day as needed for nerves...  Patient Instructions: 1)  Today we updated your med list- see below.... 2)  We decided to change you back to the Lotrel 5-20 one cap daily in addition to your Toprol... 3)  continue to monitor your BP at home... 4)  call for any problems.Marland KitchenMarland Kitchen 5)  keep your prev sched appt for the April physical..Marland Kitchen

## 2010-03-27 NOTE — Progress Notes (Signed)
Summary: toprol and lotrel refills > done  Phone Note Call from Patient Call back at Home Phone [phone]   Caller: Patient Call For: nadel Reason for Call: Refill Medication Summary of Call: Requests written rx for toprol xl 100mg  (90-day supply) will pick up. Initial call taken by: Darletta Moll,  March 21, 2010 9:29 AM  Follow-up for Phone Call        Spoke with pt to verify msg.  She is needing 90 day supply toprol to take with her [facility] on Friday 3/9.  I advised will print rx and have SN sign, and then will call her when ready. Rx on SNs cart.  Vernie Murders  March 21, 2010 10:17 AM   Additional Follow-up for Phone Call Additional follow up Details #1::        Patient calling back stating she needs another written rx to take with her to [facility].  Lotrel--patient will pick up.  Lehman Prom  March 21, 2010 2:03 PM   both rx's signed by SN.  called spoke with patient who states she will pick up the rx's tomorrow. Boone Master CNA/MA  March 21, 2010 4:04 PM      Prescriptions: LOTREL 5-20 MG CAPS (AMLODIPINE BESY-BENAZEPRIL HCL) take 1 cap by mouth once daily...  #90 x 3   Entered by:   Boone Master CNA/MA   Authorized by:   Michele Mcalpine MD   Signed by:   Boone Master CNA/MA on 03/21/2010   Method used:   Print then Give to Patient   RxID:   [id number] TOPROL XL 100 MG TB24 (METOPROLOL SUCCINATE) Take 1 tablet by mouth once a day  #90 x 3   Entered by:   Vernie Murders   Authorized by:   Michele Mcalpine MD   Signed by:   Vernie Murders on 03/21/2010   Method used:   Print then Give to Patient   RxID:   [id number]

## 2010-03-28 ENCOUNTER — Telehealth: Payer: Self-pay | Admitting: Pulmonary Disease

## 2010-04-03 NOTE — Progress Notes (Signed)
Summary: lotrel rx  Phone Note Call from Patient Call back at Home Phone [phone]   Caller: Patient Call For: Margaret Mitchell Reason for Call: Talk to Nurse Summary of Call: Patient accidentally left her lotrel prescription @ [facility], where she gets meds.   They are going to mail rx to her, but she needs 4-5 pills until then.  [facility] Initial call taken by: Lehman Prom,  March 28, 2010 10:48 AM  Follow-up for Phone Call        pt advised refill sent.Carron Curie CMA  March 28, 2010 11:08 AM     Prescriptions: LOTREL 5-20 MG CAPS (AMLODIPINE BESY-BENAZEPRIL HCL) take 1 cap by mouth once daily...  #7 x 0   Entered by:   Carron Curie CMA   Authorized by:   Michele Mcalpine MD   Signed by:   Carron Curie CMA on 03/28/2010   Method used:   Electronically to        [facility]. [phone]* (retail)       [address]       [place], [place]  [address]       Ph: [phone]       Fax: [phone]   RxID:   [id number]

## 2010-05-04 ENCOUNTER — Telehealth: Payer: Self-pay | Admitting: Pulmonary Disease

## 2010-05-04 DIAGNOSIS — E785 Hyperlipidemia, unspecified: Secondary | ICD-10-CM

## 2010-05-04 DIAGNOSIS — I10 Essential (primary) hypertension: Secondary | ICD-10-CM

## 2010-05-04 DIAGNOSIS — R079 Chest pain, unspecified: Secondary | ICD-10-CM

## 2010-05-04 DIAGNOSIS — F411 Generalized anxiety disorder: Secondary | ICD-10-CM

## 2010-05-04 NOTE — Telephone Encounter (Signed)
Dr. Kriste Basque please what labs pt needs for cpx. Thanks  Carver Fila, CMA

## 2010-05-04 NOTE — Telephone Encounter (Signed)
Spoke w/ pt and she is aware order has been placed for fasting labs and she can come anytime

## 2010-05-04 NOTE — Telephone Encounter (Signed)
Per SN---ok for labs lip, hepat, bmp, cbcd, tsh.. Just let pt know that order is in the computer and that she can come in fasting at any time. thanks

## 2010-05-07 ENCOUNTER — Other Ambulatory Visit (INDEPENDENT_AMBULATORY_CARE_PROVIDER_SITE_OTHER): Payer: Medicare Other

## 2010-05-07 ENCOUNTER — Other Ambulatory Visit (INDEPENDENT_AMBULATORY_CARE_PROVIDER_SITE_OTHER): Payer: Medicare Other | Admitting: Pulmonary Disease

## 2010-05-07 DIAGNOSIS — E785 Hyperlipidemia, unspecified: Secondary | ICD-10-CM

## 2010-05-07 DIAGNOSIS — I10 Essential (primary) hypertension: Secondary | ICD-10-CM

## 2010-05-07 DIAGNOSIS — R079 Chest pain, unspecified: Secondary | ICD-10-CM

## 2010-05-07 DIAGNOSIS — F411 Generalized anxiety disorder: Secondary | ICD-10-CM

## 2010-05-07 LAB — CBC WITH DIFFERENTIAL/PLATELET
Basophils Absolute: 0 10*3/uL (ref 0.0–0.1)
Eosinophils Absolute: 0.1 10*3/uL (ref 0.0–0.7)
Hemoglobin: 13.3 g/dL (ref 12.0–15.0)
Lymphocytes Relative: 39 % (ref 12.0–46.0)
MCHC: 34.4 g/dL (ref 30.0–36.0)
Monocytes Relative: 8.9 % (ref 3.0–12.0)
Neutro Abs: 3.2 10*3/uL (ref 1.4–7.7)
Neutrophils Relative %: 50 % (ref 43.0–77.0)
RBC: 4.14 Mil/uL (ref 3.87–5.11)
RDW: 13 % (ref 11.5–14.6)

## 2010-05-07 LAB — BASIC METABOLIC PANEL
CO2: 28 mEq/L (ref 19–32)
Calcium: 9.1 mg/dL (ref 8.4–10.5)
Creatinine, Ser: 0.8 mg/dL (ref 0.4–1.2)
GFR: 87.93 mL/min (ref >60.00)
Sodium: 142 mEq/L (ref 135–145)

## 2010-05-07 LAB — LDL CHOLESTEROL, DIRECT: Direct LDL: 104.8 mg/dL

## 2010-05-07 LAB — HEPATIC FUNCTION PANEL
ALT: 16 U/L (ref 0–35)
AST: 23 U/L (ref 0–37)
Albumin: 3.8 g/dL (ref 3.5–5.2)
Alkaline Phosphatase: 64 U/L (ref 39–117)

## 2010-05-07 LAB — LIPID PANEL
Cholesterol: 203 mg/dL — ABNORMAL HIGH (ref 0–200)
Triglycerides: 169 mg/dL — ABNORMAL HIGH (ref 0.0–149.0)

## 2010-05-07 LAB — TSH: TSH: 1.16 u[IU]/mL (ref 0.35–5.50)

## 2010-05-09 ENCOUNTER — Encounter: Payer: Self-pay | Admitting: Pulmonary Disease

## 2010-05-10 ENCOUNTER — Ambulatory Visit (INDEPENDENT_AMBULATORY_CARE_PROVIDER_SITE_OTHER): Payer: Medicare Other | Admitting: Pulmonary Disease

## 2010-05-10 ENCOUNTER — Encounter: Payer: Self-pay | Admitting: Pulmonary Disease

## 2010-05-10 DIAGNOSIS — K589 Irritable bowel syndrome without diarrhea: Secondary | ICD-10-CM

## 2010-05-10 DIAGNOSIS — K219 Gastro-esophageal reflux disease without esophagitis: Secondary | ICD-10-CM

## 2010-05-10 DIAGNOSIS — I10 Essential (primary) hypertension: Secondary | ICD-10-CM

## 2010-05-10 DIAGNOSIS — E785 Hyperlipidemia, unspecified: Secondary | ICD-10-CM

## 2010-05-10 DIAGNOSIS — M810 Age-related osteoporosis without current pathological fracture: Secondary | ICD-10-CM

## 2010-05-10 DIAGNOSIS — M1990 Unspecified osteoarthritis, unspecified site: Secondary | ICD-10-CM

## 2010-05-10 DIAGNOSIS — F411 Generalized anxiety disorder: Secondary | ICD-10-CM

## 2010-05-10 NOTE — Progress Notes (Signed)
Subjective:    Patient ID: Margaret Mitchell, female    DOB: [birth year], 70 y.o.   MRN: [id number]  HPI 70 y/o BF here for a follow up visit... she has mult med problems as noted below... she gets her meds from [facility]... she is the daughter of [name].  ~  November 07, 2009:  presents c/o sinus infection w/ pressure, HA, green drainage, sneezing, & cough... we discussed Rx w/ Depo, Augmentin, Saline, Mucinex... she also c/o "body cramps" evaluated by DrWainer & she says he put her on "mustard" but it is upsetting her stomach;  she has Soma350 & she is asked to incr this to Tid Prn;  she does describe some noct leg cramps- try tonic water Rx;  finally she reports DrWainer plans cortisone shots in neck (we don't have records)... she had BMD done 5/11 w/ normal TScores & now off Fosamax for drug holiday... she will get the 2011 Flu vaccine when she is over the current infection.  ~  March 08, 2010:  She was seen 11/11 by DrWert in my absence c/o cramps in her hands and feet> labs OK x Na=130 & BP was 130/80 so he switched Lotrel5-20 to Azor5-20, & rec incr Tonic water;  f/u TP 12/11 w/ persist cramps (no improvement w/ tonic water or Robaxin given by DrWainer) & rec to do stretching exercises;  then several phone calls> insurance refused Azor unless tried Losartan etc, so changed to Amlodip + Losartan but it made her feet feel "like they were in hot water";  after all this we decided to change back to the Lotrel 5-20 which she tolerated well & apparently had nothing to do w/ her cramps which she describes as a "lifetime prob" & does best w/ Alpraz Qhs...  ~  May 10, 2010:  ROV w/ fasting labs done recently & reviewed w/ pt> they look good, same meds, better diet & get weight down;  Her CC= neck pain w/ thorough eval by Ortho, DrWainer including XRays and 2 MRI's she says "I have a lot of arthritis & degenerating discs"> discussed rest, heat, Tylenol, etc (she does not want muscle  relaxers or pain pills)...  BP doing well on the Toprol & Lotrel> denies CP, palpit, dizzy, cerebral symptoms, SOB, edema, etc...         Problem List:  Health Maintenance - she takes ASA 81mg /d... her GYN is DrGottsegen- on Premarin 0.625mg /d... Mammograms at [facility] yearly... BMD here 5/11 w/ normal TScores & off Fosamax now for drugholiday. ~  Immunizations: she gets yearly Flu vaccines in the fall... had Pneumovax 2003 at age 70>  repeat PNEUMOVAX given 4/11 age 70.  ALLERGY (ICD-995.3) - Rx w/ Claritin, Saline, Flonase w/ prev ENT eval by DrShowmaker. ~  10/11:  seen w/ sinusitis- Rx Augmentin, Depo, Mucinex, Saline...  HYPERTENSION (ICD-401.9) - on ASA 81mg /d, TOPROL XL 100mg /d, & LOTREL 5/20 Daily...  ~  CXR 4/11 was clear, WNL...  ~  2/12:  BP= 136/88 and similiar at home per pt in the 130/80 range... denies visual changes, CP, palipit, dizziness, syncope, dyspnea, edema, etc... she exercises by walking... ~  4/12:  BP= 114/68 and continues to feel well overall...  HYPERCHOLESTEROLEMIA, BORDERLINE (ICD-272.4) - on diet alone + Fish Oil... ~  FLP 4/10 (wt=203#) showed TChol 209, TG 116, HDL 67, LDL 105 ~  FLP 4/11 (wt=210#) showed TChol 175, TG 138, HDL 61, LDL 87 ~  FLP 4/12 (wt=211#) showed TChol 203, TG  169, HDL 65, LDL 105  GERD (ICD-530.81) - on NEXIUM 40mg Bid... ~  last EGD was 4/07 by DrPatterson showing chr GERD and gastric polyps...  ~  AbdSonar 2007 w/ no gallstones, negative sonar...  ~  HIDA Scan 5/09 was norm w/ eject fract 76% & patent ducts...  IRRITABLE BOWEL SYNDROME (ICD-564.1) -  ~  last colonoscopy 9/07 by DrPatterson showed melanosis only, no other abn...  CONSTIPATION (ICD-564.00) - on Metamucil etc...  DEGENERATIVE JOINT DISEASE (ICD-715.90) - she uses Arthritis Tylenol as needed... ~  10/11:  she reports followed by DrWainer w/ cortisone shots in neck planned- currently taking "mustard" for "body cramps" but it upset her stomach; asked to try tonic water  for nocturnal leg cramps, and incr [id number] Tid Prn muscle spasm. ~  4/12:  She states she's had XRays & 2 MRIs from DrWainer w/ "alot of arthritis & degenerating discs"; given 2 shots- helped short term;  She stopped Soma & just uses Alpraz Prn...  OSTEOPOROSIS (ICD-733.00) - on Calcium and Vits w/ 1000 VitD... off prev fosamax w/ norm BMD... ~  BMD 4/09 much improved w/ TScores -0.2 to -0.7.Marland KitchenMarland Kitchen Fosamax decr to [employer]. ~  Vit D level 4/09 = 32... rec> Vit D 1000 u daily... ~  Vit D level 4/10 = 46... rec> continue same. ~  5/11:  BMD here showed TScores -0.7 in Spine, and -0.5 in left FemNeck... rec> stop Fosamax for drug holiday.  HEADACHE (ICD-784.0)  ANXIETY (ICD-300.00) - on ALPRAZOLAM 0.5mg  as needed... her husb had an MI, cares for elderly mother.   Past Surgical History  Procedure Date  . Parathyroid adenoma removed   . Vesicovaginal fistula closure w/ tah   . Tonsillectomy   . Appendectomy     Outpatient Encounter Prescriptions as of 05/10/2010  Medication Sig Dispense Refill  . ALPRAZolam (XANAX) 0.5 MG tablet Take 0.5 mg by mouth 3 (three) times daily as needed.        Marland Kitchen amLODipine-benazepril (LOTREL) 5-20 MG per capsule Take 1 capsule by mouth daily.        Marland Kitchen aspirin 81 MG tablet Take 81 mg by mouth daily.        . Calcium Carb-Cholecalciferol (CALCIUM 1000 + D PO) Take 1 capsule by mouth daily.        . Cholecalciferol (VITAMIN D) 1000 UNITS capsule Take 1,000 Units by mouth daily.        Marland Kitchen esomeprazole (NEXIUM) 40 MG capsule Take 40 mg by mouth daily before breakfast.        . estrogens, conjugated, (PREMARIN) 0.625 MG tablet Take 0.625 mg by mouth daily.        . metoprolol (TOPROL-XL) 100 MG 24 hr tablet Take 100 mg by mouth daily.        . Calcium Polycarbophil (FIBER) 625 MG TABS Take 2 tablets by mouth daily.        . Multiple Vitamins-Minerals (MULTIVITAMIN WITH MINERALS) tablet Take 1 tablet by mouth daily.        . Omega-3 Fatty Acids (FISH OIL) 1000 MG CAPS Take  as directed         Allergies  Allergen Reactions  . Amlodipine Besylate     REACTION: causes her feet to swell and burning  . Prednisone     REACTION: heart palpatations  . Rofecoxib     REACTION: mouth swelling  . Sulfonamide Derivatives     REACTION: pt unable to remember  . Terfenadine  Review of Systems        See HPI - all other systems neg except as noted... The patient complains of dyspnea on exertion.  The patient denies anorexia, fever, weight loss, weight gain, vision loss, decreased hearing, hoarseness, chest pain, syncope, peripheral edema, prolonged cough, headaches, hemoptysis, abdominal pain, melena, hematochezia, severe indigestion/heartburn, hematuria, incontinence, muscle weakness, suspicious skin lesions, transient blindness, difficulty walking, depression, unusual weight change, abnormal bleeding, enlarged lymph nodes, and angioedema.     Objective:   Physical Exam      WD, WN, 70 y/o BF in NAD... GENERAL:  Alert & oriented; pleasant & cooperative... HEENT:  [place]/AT, EOM-wnl, PERRLA, EACs-clear, TMs-wnl, NOSE- sl red w/ drainage, THROAT-clear & wnl. NECK:  Supple w/ fairROM; no JVD; normal carotid impulses w/o bruits; no thyromegaly or nodules palpated; no lymphadenopathy. CHEST:  Clear to P & A; without wheezes/ rales/ or rhonchi. HEART:  Regular Rhythm; without murmurs/ rubs/ or gallops. ABDOMEN:  Soft & nontender; normal bowel sounds; no organomegaly or masses detected. EXT: without deformities, mild arthritic changes; no varicose veins/ +venous insuffic/ no edema. NEURO:  CN's intact; motor testing normal; sensory testing normal; ?balance off but no specific gait abn noted. DERM:  No lesions noted; no rash etc..   Assessment & Plan:   HBP>  Controlled on BBlocker, ACE/ CCB; continue same; no salt & get wt down...  CHOL>  FLP is close to goal values on the diet & exercise program, no meds other than Fish Oil; continue same...  GERD>  Controlled on  Nexium Bid...  DJD>  Followed by DrWainer, eval as above & she uses arthritis tylenol alone (doesn't want stronger pain meds)...  Osteopenia>  Last BMD 5/11 was WNL & she is on a bisphos drug holiday...  Anxiety>  She is under a mod amt of stress & uses Alpraz prn.Marland KitchenMarland Kitchen

## 2010-05-10 NOTE — Patient Instructions (Signed)
Today we updated your med list in our EPIC system...    Continue your current meds the same...  We reviewed your recent lab work & gave you a copy for your records... Call for any questions... Let's get on track w/ our diet & exercise program, the goal is to lose 10-15 lbs... Please sched a follow up appt for 6 months, sooner if needed.Marland KitchenMarland Kitchen

## 2010-05-18 ENCOUNTER — Encounter: Payer: Self-pay | Admitting: Pulmonary Disease

## 2010-06-01 NOTE — Op Note (Signed)
NAMERHEALYNN, Margaret Mitchell             ACCOUNT NO.:  [id number]   MEDICAL RECORD NO.:  [id number]          PATIENT TYPE:  AMB   LOCATION:  DSC                          FACILITY:  [facility]   PHYSICIAN:  Robert A. Thurston Hole, M.D. DATE OF BIRTH:  1940-09-26   DATE OF PROCEDURE:  06/04/2004  DATE OF DISCHARGE:                                 OPERATIVE REPORT   PREOPERATIVE DIAGNOSES:  1.  Right shoulder rotator cuff tear.  2.  Right shoulder partial labrum tear.  3.  Right shoulder impingement.  4.  Right shoulder acromioclavicular joint degenerative joint disease and      arthrosis.   POSTOPERATIVE DIAGNOSES:  1.  Right shoulder rotator cuff tear.  2.  Right shoulder partial labrum tear.  3.  Right shoulder impingement.  4.  Right shoulder acromioclavicular joint degenerative joint disease and      arthrosis.   OPERATION PERFORMED:  1.  Right shoulder examination under anesthesia followed by arthroscopically      assisted rotator cuff repair using Arthrex suture anchor times one.  2.  Right shoulder partial labrum tear debridement.  3.  Right shoulder subacromial decompression.  4.  Right shoulder distal clavicle excision.   SURGEON:  Elana Alm. Thurston Hole, M.D.   ASSISTANT:  Julien Girt, P.A.   ANESTHESIA:  General.   OPERATIVE TIME:  One hour.   COMPLICATIONS:  None.   INDICATIONS FOR PROCEDURE:  [name] is a 70 year old woman who has had  six to eight months of increasing right shoulder pain with exam and MRI  documenting high grade partial versus complete rotator cuff tear with  partial labrum tear and impingement with acromioclavicular joint arthritis  who has failed conservative care and is now to undergo arthroscopy.   DESCRIPTION OF PROCEDURE:  [name] was brought to the operating room on  May 22,2006 after an interscalene block had been placed in the holding room  by anesthesia.  She was placed on the operating table in supine position.  After being placed under  general anesthesia, her right shoulder was  examined.  She had full range of motion and her shoulder was stable to  ligamentous exam.  She was then placed in a beach chair position and her  shoulder and arm was prepped using sterile DuraPrep and draped using sterile  technique.  She received Ancef 1 gm IV preoperatively for prophylaxis.  Initially, the arthroscopy was performed through a posterior arthroscopic  portal.  The arthroscope with a pump attached was placed and through an  anterior portal, and arthroscopic probe was placed.  On initial inspection,  the articular cartilage in the glenohumeral joint was intact.  Anterior,  superior and posterior labrum which had partial tearing 25 to 30% which was  debrided.  Biceps tendon anchor and biceps tendon was intact.  Inferior  labrum and anterior inferior glenohumeral ligament complex was intact.  Rotator cuff showed a complete tear of the supraspinatus.  The rest of the  rotator cuff was intact.  This was partially debrided arthroscopically.  The  subacromial space was then entered and a lateral arthroscopic portal was  made.  Moderately thickened bursitis was resected.  Impingement was noted  and a subacromial decompression was carried out removing 6 to 8 mm of the  undersurface of the anterior, anterolateral and anteromedial acromion and CA  ligament release carried out as well.  The [facility] joint showed significant  spurring and degenerative changes in the distal 5 to 6 mm of the clavicle  was resected with a 6 mm bur.  After this was done, then through an  accessory portal, an Arthrex 5.5 mm suture anchor was placed in the greater  tuberosity.  Each of these sutures was then passed through the rotator cuff  tear and then tied down arthroscopically thus repairing the rotator cuff  tear back down to the greater tuberosity.  After this was done, the shoulder  could be brought through a full range of motion with no impingement on the  repair.   At this point it was felt that all pathology had been  satisfactorily addressed.  The instruments were removed.  Portals closed  with 3-0 nylon suture, sterile dressings and sling applied.  The patient  awakened and taken to recovery room in stable condition.   FOLLOW UP:  [name] will be followed at an outpatient on Percocet for  pain with early physical therapy.  See her back in the office in a week for  suture removal and follow-up.       RAW/MEDQ  D:  06/04/2004  T:  06/04/2004  Job:  [id number]

## 2010-06-01 NOTE — Op Note (Signed)
[name], [name]             ACCOUNT NO.:  [id number]   MEDICAL RECORD NO.:  [id number]          PATIENT TYPE:  AMB   LOCATION:  DSC                          FACILITY:  [facility]   PHYSICIAN:  Robert A. Thurston Hole, M.D. DATE OF BIRTH:  [birth year]   DATE OF PROCEDURE:  04/28/2006  DATE OF DISCHARGE:                               OPERATIVE REPORT   PREOPERATIVE DIAGNOSES:  1. Left shoulder rotator cuff tear.  2. Left shoulder partial labrum tear.  3. Left shoulder impingement.  4. Left shoulder acromioclavicular joint degenerative joint disease      and spurring.   POSTOPERATIVE DIAGNOSIS:  1. Left shoulder rotator cuff tear.  2. Left shoulder partial labrum tear.  3. Left shoulder impingement.  4. Left shoulder acromioclavicular joint degenerative joint disease      and spurring.   PROCEDURE:  1. Left shoulder examination under anesthesia followed by      arthroscopically assisted rotator cuff repair using Arthrex suture      anchor times one.  2. Left shoulder arthroscopic debridement partial labrum tear.  3. Left shoulder subacromial decompression.  4. Left shoulder distal clavicle excision.   SURGEON:  Elana Alm. Thurston Hole, M.D.   ASSISTANT:  Julien Girt, P.A.   ANESTHESIA:  General.   OPERATIVE TIME:  45 minutes.   COMPLICATIONS:  None.   INDICATIONS FOR PROCEDURE:  Margaret Mitchell is a 65-year woman who has had  significant left shoulder pain for the past six to eight months  increasing in nature with exam and MRI documenting rotator cuff tear  with impingement and AC joint arthropathy.  She has failed conservative  care is now to undergo arthroscopy.   DESCRIPTION:  [name] was brought to operating room on 04/28/2006  after interscalene block was placed in holding room by anesthesia.  She  was placed on the operative table supine position.  After being placed  under general anesthesia, her left shoulder was examined.  She had full  range of motion, and her  shoulder was stable to ligamentous exam.  She  was then placed in beach chair position and her shoulder and arm was  prepped using sterile DuraPrep and draped using sterile technique.  She  received Ancef 1 grams IV preoperatively for prophylaxis.  Initially  through a posterior arthroscopic portal, the arthroscope with a pump  attached was placed and through an anterior portal an arthroscopic probe  was placed.  On initial inspection the articular cartilage in the  glenohumeral joint was intact.  Anterior labrum partial tearing 25%  which was debrided, superior labrum biceps tendon anchor was intact.  Inferior labrum and anterior inferior glenohumeral ligament complex was  intact.  Posterior labrum intact.  Rotator cuff showed a complete tear  of the supraspinatus and a partial tear of the infraspinatus this was  partially debrided arthroscopically.  The rest of the rotator cuff was  intact.  Inferior capsular recess free of pathology.  Subacromial space  was entered.  A lateral arthroscopic portal was made.  Moderately  thickened bursitis was resected.  Impingement was noted and subacromial  decompression was carried  out removing 6 to 8 mm of the undersurface of  the anterior, anterolateral, anteromedial acromion and CA ligament  release carried out as well.  The [facility] joint showed significant spurring  and degenerative changes and the distal 5 to 6 mm of clavicle was  resected with a 6-mm bur.  After this done the rotator cuff tear was  further debrided arthroscopically.  A second lateral portal was made.  An Arthrex suture anchor was placed in the greater tuberosity and the  sutures and this anchor were passed through the rotator cuff tear  arthroscopically and then tied down arthroscopically in a mattress  suture technique with a firm and tight repair.  The shoulder could be  brought through full range of motion with no impingement on the repair  after this was done.  At this point was  felt that all pathology been  satisfactorily addressed.  The instruments were removed.  Portals closed  with 3-0 nylon suture.  Sterile dressings and a sling applied.  The  patient awakened and taken to recovery in stable condition.  Follow-up  care Ms. Amparo will be followed as a outpatient on Percocet and Robaxin  with early physical therapy.  See her back in office in a week for  sutures out follow-up.      Robert A. Thurston Hole, M.D.  Electronically Signed     RAW/MEDQ  D:  04/28/2006  T:  04/28/2006  Job:  [id number]

## 2010-06-01 NOTE — Assessment & Plan Note (Signed)
Margaret Mitchell is a 70 year old black female who is referred by Dr. Kriste Basque for  management of chronic intermittent cervicalgia.   Margaret Mitchell states her pain began approximately 1996 while she was going to  school.  She denies any history of any kind of trauma or injury to gradually  seem to come on.  Pain is typically worse with sitting and lying down.  It  gets better when she walks around.  It is localized to the posterior  cervical region, slightly into the upper scapulae bilaterally.  Average pain  is about a 5 on a scale of 10, today it is about a 3.  Again, her pain is  quite variable.  She describes that it feels like a toothache.  Her sleep is  fair, although it does both her at night somewhat.  Dr. Kriste Basque had treated  her with some Darvocet.  She tells me she does not like to take it all the  time because it makes her feel drunk.  She averages approximately four  pills every month or so.   Her functional status is very high.  She is able to walk 60 minutes at a  time.  She is able to climb stairs and drive.  She is independent with all  of her self-care.   REVIEW OF SYSTEMS:  Essentially negative.   History of some gastroesophageal reflux disease. She did have an ulcer back  in the late 1990s.  No problems since then.  She takes AcipHex and  metoclopramide for that.   PAST SURGICAL HISTORY:  1.  Appendectomy.  2.  Parathyroidectomy.  3.  Hysterectomy.  4.  Tonsillectomy.  5.  Right rotator cuff surgery, Dr. Thurston Hole, a few years ago.   SOCIAL HISTORY:  The patient remarried her husband, who she had separated  from many years ago.  She has a 28 year old son who does not live with her.  She is retired.  Denies any kind of illegal drug use.  Denies alcohol use.  Denies smoking.   Mother alive, age 53, hypertension, diabetes.  Father died of lung cancer.   PHYSICAL EXAMINATION:  GENERAL APPEARANCE:  She is a well-developed, well-  nourished black female who appears her stated  age.  She is oriented x3.  Her  affect is bright, cooperative and alert.  VITAL SIGNS:  Blood pressure 140/78, pulse 65, respirations 16, 98%  saturated on room air.  NEUROLOGIC:  She is able to stand up easily after being seated.  Her gait  has normal heel-toe mechanics, normal base of support.  No antalgia is  noted.  Her balance is good.  Romberg's test is checked and is negative. She  performs tandem gait quite adequately.  She has diminished range of motion  in her cervical spine with rotation to the left and with left lateral  flexion, she lacks about 15% compared to the right in both of these areas.  She has full flexion and extension.  She has full shoulder range of motion  without pain.  Seated, her reflexes are symmetric and intact.  No abnormal  tone is noted.  Motor strength is good throughout both upper extremities.  No obvious tenderness to palpation is noted over the cervical paraspinal  musculature or the spine or over the scapula.  With palpation, I do not  aggravate her condition at all.  She tells me it is much deeper.  Her head  is set quite forward and she has a mild kyphosis at  the cervical thoracic  junction.   IMPRESSION:  1.  Cervicalgia.  2.  Suspect cervical spondylosis.   PLAN:  After 20-minute discussion regarding treatment options, Margaret Mitchell  prefers to use as little medication as we can get by with.  She would like  to have her sleeping further addressed and we will trial her on Neurontin  300 mg nightly #30 given.  Otherwise intermittently she will trial ibuprofen  600 mg once a day as needed and Ultracet one to two per day as needed #45  given.  However, her main interest is to try to avoid oral medications and  in that respect, we will provide her with a prescription for Lidoderm, she  can use up to three patches at a time, two hours on, two hours off, and will  get her set up for a TENS unit trial.  Will see her back in a month.            ______________________________  Brantley Stage, M.D.     DMK/MedQ  D:  03/11/2005 14:13:08  T:  03/12/2005 09:54:43  Job #:  [id number]   cc:   Lonzo Cloud. Kriste Basque, M.D. [facility]  [address]  [place]  [place] [address]

## 2010-06-01 NOTE — Assessment & Plan Note (Signed)
 HEALTHCARE                           GASTROENTEROLOGY OFFICE NOTE   Margaret Mitchell, Margaret Mitchell                    MRN:          [id number]  DATE:09/05/2005                            DOB:          1940/11/13    Pat apparently had some positive Hemoccult cards at Dr. Verl Dicker office.  She does not have GI complaints at this time.  She does have some slight  fatigue.  I recently did endoscopy in April which showed non-duodenal polyp  and some acid reflux changes.  She really is asymptomatic while taking  AcipHex 20 mg a day and Reglan at bedtime.  Her last colonoscopy was  performed in February 2005 and was unremarkable.   [name] does have some mild constipation but this is doing better with the  Reglan therapy and fiber supplements.  She is on a variety of other  medications which are reviewed in her chart which do include Metoprolol 50  mg twice a day and Fosamax 70 mg a week.  She just had previous parathyroid  surgery and does have mild essential hypertension.   EXAM:  VITAL SIGNS:  Weight 206 pounds.  Blood pressure 132/80.  Pulse 72  and regular.  GENERAL:  She is a healthy-appearing black female appearing her stated age.  I could not appreciate stigmata of chronic liver disease.  ABDOMINAL EXAM:  Showed no hepatosplenomegaly, masses, or tenderness. Bowel  sounds were normal.  RECTAL:  Inspection of the rectum was unremarkable as was rectal exam and  there was soft stool present.  It was guaiac negative.   ASSESSMENT:  1. Chronic acid reflux, doing well on PPI therapy.  2. Guaiac-positive stools--rule out colon polyps.  3. Well-controlled essential hypertension.  4. History of osteoporosis.  5. Chronic constipation.   RECOMMENDATIONS:  We will go ahead and check out the patient's colonoscopy  and check some screening laboratory parameters to do an anemia workup.  I  will let her try Amitiza 24 mcg twice a day to see if this helps her  constipation in the interim.                                   Vania Rea. Jarold Motto, MD, [place] [place], [place]   DRP/MedQ  DD:  09/05/2005  DT:  09/05/2005  Job #:  [id number]   cc:   Reuel Boom L. Eda Paschal, MD

## 2010-06-19 ENCOUNTER — Telehealth: Payer: Self-pay | Admitting: Pulmonary Disease

## 2010-06-19 NOTE — Telephone Encounter (Signed)
Pt last cpx was 4.25.11.  Pt was last seen by SN 4.26.12 but ov was documented as a 2 month follow up.  Called spoke with patient, informed her that the cxr and ekg were most likely not done because the ov was a "follow up" rather than a "physical."  Pt okay with this but asked that her upcoming appt in Oct be her physical b/c she does not want to go 2 years w/o a cxr and ekg.  i informed pt that i will change her appt from a f/u to a cpx and she can discuss with SN when she comes in Oct.  Pt okay with this and verbalized her understanding.  Pt denies any problems at this time to warrant a cxr an ekg before appt with SN.

## 2010-07-18 ENCOUNTER — Other Ambulatory Visit: Payer: Self-pay | Admitting: Pulmonary Disease

## 2010-07-24 ENCOUNTER — Telehealth: Payer: Self-pay | Admitting: Pulmonary Disease

## 2010-07-24 MED ORDER — ALPRAZOLAM 0.5 MG PO TABS: 0.5000 mg | ORAL_TABLET | Freq: Three times a day (TID) | ORAL | Status: DC | PRN

## 2010-07-24 NOTE — Telephone Encounter (Signed)
Refill called to pharmacy and pt informed.

## 2010-08-24 ENCOUNTER — Other Ambulatory Visit: Payer: Self-pay | Admitting: Pulmonary Disease

## 2010-08-31 ENCOUNTER — Emergency Department (HOSPITAL_COMMUNITY)
Admission: EM | Admit: 2010-08-31 | Discharge: 2010-08-31 | Disposition: A | Discharge status: Home / Self Care | Payer: Medicare Other | Attending: Emergency Medicine | Admitting: Emergency Medicine

## 2010-08-31 ENCOUNTER — Emergency Department (HOSPITAL_COMMUNITY): Payer: Medicare Other

## 2010-08-31 DIAGNOSIS — Z7982 Long term (current) use of aspirin: Secondary | ICD-10-CM | POA: Insufficient documentation

## 2010-08-31 DIAGNOSIS — I10 Essential (primary) hypertension: Secondary | ICD-10-CM | POA: Insufficient documentation

## 2010-08-31 DIAGNOSIS — Z79899 Other long term (current) drug therapy: Secondary | ICD-10-CM | POA: Insufficient documentation

## 2010-08-31 DIAGNOSIS — R279 Unspecified lack of coordination: Secondary | ICD-10-CM | POA: Insufficient documentation

## 2010-08-31 DIAGNOSIS — R42 Dizziness and giddiness: Secondary | ICD-10-CM | POA: Insufficient documentation

## 2010-08-31 DIAGNOSIS — K219 Gastro-esophageal reflux disease without esophagitis: Secondary | ICD-10-CM | POA: Insufficient documentation

## 2010-08-31 LAB — DIFFERENTIAL
Eosinophils Relative: 1 % (ref 0–5)
Lymphocytes Relative: 28 % (ref 12–46)
Monocytes Absolute: 0.8 10*3/uL (ref 0.1–1.0)
Monocytes Relative: 10 % (ref 3–12)
Neutro Abs: 4.8 10*3/uL (ref 1.7–7.7)

## 2010-08-31 LAB — COMPREHENSIVE METABOLIC PANEL
Alkaline Phosphatase: 67 U/L (ref 39–117)
BUN: 14 mg/dL (ref 6–23)
Calcium: 9.6 mg/dL (ref 8.4–10.5)
Creatinine, Ser: 0.68 mg/dL (ref 0.50–1.10)
GFR calc Af Amer: >60 mL/min (ref >60)
Glucose, Bld: 94 mg/dL (ref 70–99)
Total Protein: 7.5 g/dL (ref 6.0–8.3)

## 2010-08-31 LAB — CBC
HCT: 37.8 % (ref 36.0–46.0)
Hemoglobin: 12.9 g/dL (ref 12.0–15.0)
MCH: 30.4 pg (ref 26.0–34.0)
MCHC: 34.1 g/dL (ref 30.0–36.0)
MCV: 89.2 fL (ref 78.0–100.0)
RDW: 12.7 % (ref 11.5–15.5)

## 2010-09-03 ENCOUNTER — Telehealth: Payer: Self-pay | Admitting: Pulmonary Disease

## 2010-09-03 NOTE — Telephone Encounter (Signed)
Spoke with pt. She states went to ED last wk with dizziness and nausea. She states that she was given antivert and steroids. She states that "feels like falling" when she lies down. Antivert helps some but not for long. She denies any vision changes or other c/o's. I have sched her to see SN at 2 pm tomorrow and advised seek emergency care in the meantime if needed and pt verbalized understanding.

## 2010-09-04 ENCOUNTER — Encounter: Payer: Self-pay | Admitting: Pulmonary Disease

## 2010-09-04 ENCOUNTER — Ambulatory Visit (INDEPENDENT_AMBULATORY_CARE_PROVIDER_SITE_OTHER): Payer: Medicare Other | Admitting: Pulmonary Disease

## 2010-09-04 VITALS — BP 120/70 | HR 60 | Temp 97.2°F | Ht 66.5 in | Wt 187.0 lb

## 2010-09-04 DIAGNOSIS — I10 Essential (primary) hypertension: Secondary | ICD-10-CM

## 2010-09-04 DIAGNOSIS — E785 Hyperlipidemia, unspecified: Secondary | ICD-10-CM

## 2010-09-04 DIAGNOSIS — R42 Dizziness and giddiness: Secondary | ICD-10-CM

## 2010-09-04 DIAGNOSIS — K219 Gastro-esophageal reflux disease without esophagitis: Secondary | ICD-10-CM

## 2010-09-04 DIAGNOSIS — K589 Irritable bowel syndrome without diarrhea: Secondary | ICD-10-CM

## 2010-09-04 DIAGNOSIS — M1990 Unspecified osteoarthritis, unspecified site: Secondary | ICD-10-CM

## 2010-09-04 DIAGNOSIS — F411 Generalized anxiety disorder: Secondary | ICD-10-CM

## 2010-09-04 DIAGNOSIS — M810 Age-related osteoporosis without current pathological fracture: Secondary | ICD-10-CM

## 2010-09-04 MED ORDER — MECLIZINE HCL 25 MG PO TABS: 25.0000 mg | ORAL_TABLET | Freq: Four times a day (QID) | ORAL | Status: DC | PRN

## 2010-09-04 NOTE — Patient Instructions (Signed)
Today we updated your med list in EP[facility]...    We refilled your ANTIVERT/ Meclizine to take up to 4 times dailt for the dizziness...    We also decided to decrease the Metoprolol to 1/2 tab in AM, and take the LOTREL at dinnertime in the PM...  We demonstrated & gave you a hand out regarding the EPLEY maneuver to do several times a day for the dizziness...  We will arrange for a Neurology consultation to be sure there is nothing else going on w/ your vertigo...  Be careful w/ your new diet program & be sure you are not losing weight too fast...  Call for any questions...  Please keep your prev scheduled f/u appt on 11/13/10.Marland KitchenMarland Kitchen

## 2010-09-04 NOTE — Progress Notes (Signed)
Subjective:    Patient ID: Margaret Mitchell, female    DOB: Aug 02, 1940, 70 y.o.   MRN: [id number]  HPI 70 y/o BF here for a follow up visit... she has mult med problems as noted below... she gets her meds from [facility]... she is the daughter of Margaret Mitchell.  ~  November 07, 2009:  presents c/o sinus infection w/ pressure, HA, green drainage, sneezing, & cough... we discussed Rx w/ Depo, Augmentin, Saline, Mucinex... she also c/o "body cramps" evaluated by DrWainer & she says he put her on "mustard" but it is upsetting her stomach;  she has Soma350 & she is asked to incr this to Tid Prn;  she does describe some noct leg cramps- try tonic water Rx;  finally she reports DrWainer plans cortisone shots in neck (we don't have records)... she had BMD done 5/11 w/ normal TScores & now off Fosamax for drug holiday... she will get the 2011 Flu vaccine when she is over the current infection.  ~  March 08, 2010:  She was seen 11/11 by DrWert in my absence c/o cramps in her hands and feet> labs OK x Na=130 & BP was 130/80 so he switched Lotrel5-20 to Azor5-20, & rec incr Tonic water;  f/u TP 12/11 w/ persist cramps (no improvement w/ tonic water or Robaxin given by DrWainer) & rec to do stretching exercises;  then several phone calls> insurance refused Azor unless tried Losartan etc, so changed to Amlodip + Losartan but it made her feet feel "like they were in hot water";  after all this we decided to change back to the Lotrel 5-20 which she tolerated well & apparently had nothing to do w/ her cramps which she describes as a "lifetime prob" & does best w/ Alpraz Qhs...  ~  May 10, 2010:  ROV w/ fasting labs done recently & reviewed w/ pt> they look good, same meds, better diet & get weight down;  Her CC= neck pain w/ thorough eval by Ortho, DrWainer including XRays and 2 MRI's she says "I have a lot of arthritis & degenerating discs"> discussed rest, heat, Tylenol, etc (she does not want muscle  relaxers or pain pills)...  BP doing well on the Toprol & Lotrel> denies CP, palpit, dizzy, cerebral symptoms, SOB, edema, etc...  ~  September 04, 2010:  34mo ROV & post-ER check> she went to the ED 08/31/10 w/ dizziness, acute onset that day, unsteady on her feet, no focal neuro deficits on exam by DrZammit, LABS wnl, CTBrain w/ mild atrophy, sm vessel dis & NAD, given Valium & Antivert> states some better in the AM but gets worse in the afternoon;  It appears positional but not postural, yet BP today drops 20 points when standing (140 supine ==> 120 standing); We discussed continued Rx w/ Antivert, Valium, & try Epley maneuvers; we will adjust BP meds by decreasing the Metoprolol to 1/2 in AM & change the Lotrel to PM >> we will refer to Neuro for further suggestions...    We reviewed her ER Labs & Brain scan, plus her current med list (see below)...    She notes that her neck pain & arthritis are some improved w/ Tylenol & her Xanax...         Problem List:  Health Maintenance - she takes ASA 81mg /d... her GYN is DrGottsegen- on Premarin 0.625mg /d... Mamm[facility] at [facility]... BMD here 5/11 w/ normal TScores & off Fosamax now for drugholiday. ~  Immunizations: she gets  yearly Flu vaccines in the fall... had Pneumovax 2003 at age 4>  repeat PNEUMOVAX given 4/11 age 53.  ALLERGY (ICD-995.3) - Rx w/ Claritin, Saline, Flonase w/ prev ENT eval by DrShoemaker. ~  10/11:  seen w/ sinusitis- Rx Augmentin, Depo, Mucinex, Saline...  HYPERTENSION (ICD-401.9) - on ASA 81mg /d, TOPROL XL 100mg /d, & LOTREL 5/20 Daily...  ~  CXR 4/11 was clear, WNL...  ~  2/12:  BP= 136/88 and similiar at home per pt in the 130/80 range... denies visual changes, CP, palipit, dizziness, syncope, dyspnea, edema, etc... she exercises by walking... ~  4/12:  BP= 114/68 and continues to feel well overall... ~  8/12:  BP= 120/70 sitting/ standing (140 supine) & we decided to decr the Metoprolol to 1/2 tab in AM & take the Lotrel in  PM.  HYPERCHOLESTEROLEMIA, BORDERLINE (ICD-272.4) - on diet alone + Fish Oil... ~  FLP 4/10 (wt=203#) showed TChol 209, TG 116, HDL 67, LDL 105 ~  FLP 4/11 (wt=210#) showed TChol 175, TG 138, HDL 61, LDL 87 ~  FLP 4/12 (wt=211#) showed TChol 203, TG 169, HDL 65, LDL 105  GERD (ICD-530.81) - on NEXIUM 40mg Bid... ~  last EGD was 4/07 by DrPatterson showing chr GERD and gastric polyps...  ~  AbdSonar 2007 w/ no gallstones, negative sonar...  ~  HIDA Scan 5/09 was norm w/ eject fract 76% & patent ducts...  IRRITABLE BOWEL SYNDROME (ICD-564.1) -  ~  last colonoscopy 9/07 by DrPatterson showed melanosis only, no other abn...  CONSTIPATION (ICD-564.00) - on Metamucil etc...  DEGENERATIVE JOINT DISEASE (ICD-715.90) - she uses Arthritis Tylenol as needed... ~  10/11:  she reports followed by DrWainer w/ cortisone shots in neck planned- currently taking "mustard" for "body cramps" but it upset her stomach; asked to try tonic water for nocturnal leg cramps, and incr [id number] Tid Prn muscle spasm. ~  4/12:  She states she's had XRays & 2 MRIs from DrWainer w/ "alot of arthritis & degenerating discs"; given 2 shots- helped short term;  She stopped Soma & just uses Alpraz Prn...  OSTEOPOROSIS (ICD-733.00) - on Calcium and Vits w/ 1000 VitD... off prev fosamax w/ norm BMD... ~  BMD 4/09 much improved w/ TScores -0.2 to -0.7.Marland KitchenMarland Kitchen Fosamax decr to [employer]. ~  Vit D level 4/09 = 32... rec> Vit D 1000 u daily... ~  Vit D level 4/10 = 46... rec> continue same. ~  5/11:  BMD here showed TScores -0.7 in Spine, and -0.5 in left FemNeck... rec> stop Fosamax for drug holiday.  HEADACHE (ICD-784.0)  ANXIETY (ICD-300.00) - on ALPRAZOLAM 0.5mg  as needed... her husb had an MI, cares for elderly mother.   Past Surgical History  Procedure Date  . Parathyroid adenoma removed   . Vesicovaginal fistula closure w/ tah   . Tonsillectomy   . Appendectomy     Outpatient Encounter Prescriptions as of 09/04/2010    Medication Sig Dispense Refill  . ALPRAZolam (XANAX) 0.5 MG tablet Take 1 tablet (0.5 mg total) by mouth 3 (three) times daily as needed.  90 tablet  5  . amLODipine-benazepril (LOTREL) 5-20 MG per capsule Take 1 capsule by mouth daily.        Marland Kitchen aspirin 81 MG tablet Take 81 mg by mouth daily.        . Calcium Carb-Cholecalciferol (CALCIUM 1000 + D PO) Take 1 capsule by mouth daily.        . Calcium Polycarbophil (FIBER) 625 MG TABS Take 2 tablets by mouth  daily.        . Cholecalciferol (VITAMIN D) 1000 UNITS capsule Take 1,000 Units by mouth daily.        . diazepam (VALIUM) 5 MG tablet Take 5 mg by mouth every 6 (six) hours as needed.        Marland Kitchen estrogens, conjugated, (PREMARIN) 0.625 MG tablet Take 0.625 mg by mouth daily.        . meclizine (ANTIVERT) 25 MG tablet Take 25 mg by mouth 3 (three) times daily as needed.        . metoprolol (TOPROL-XL) 100 MG 24 hr tablet Take 100 mg by mouth daily.        . Multiple Vitamins-Minerals (MULTIVITAMIN WITH MINERALS) tablet Take 1 tablet by mouth daily.        Marland Kitchen NEXIUM 40 MG capsule TAKE 1 CAPSULE BY MOUTH TWICE  A DAY 30 MINUTES BEFORE MEALS  180 capsule  0  . Omega-3 Fatty Acids (FISH OIL) 1000 MG CAPS Take as directed         Allergies  Allergen Reactions  . Amlodipine Besylate     REACTION: causes her feet to swell and burning  . Prednisone     REACTION: heart palpatations  . Rofecoxib     REACTION: mouth swelling  . Sulfonamide Derivatives     REACTION: pt unable to remember  . Terfenadine     Current Medications, Allergies, Past Medical History, Past Surgical History, Family History, and Social History were reviewed in [employer] record.    Review of Systems        See HPI - all other systems neg except as noted... The patient complains of dyspnea on exertion.  The patient denies anorexia, fever, weight loss, weight gain, vision loss, decreased hearing, hoarseness, chest pain, syncope, peripheral edema,  prolonged cough, headaches, hemoptysis, abdominal pain, melena, hematochezia, severe indigestion/heartburn, hematuria, incontinence, muscle weakness, suspicious skin lesions, transient blindness, difficulty walking, depression, unusual weight change, abnormal bleeding, enlarged lymph nodes, and angioedema.     Objective:   Physical Exam      WD, WN, 70 y/o BF in NAD... GENERAL:  Alert & oriented; pleasant & cooperative... HEENT:  [place]/AT, EOM-wnl, PERRLA, no nystagmus, EACs-clear, TMs-wnl, NOSE- sl red w/ drainage, THROAT-clear & wnl. NECK:  Supple w/ fairROM; no JVD; normal carotid impulses w/o bruits; no thyromegaly or nodules palpated; no lymphadenopathy. CHEST:  Clear to P & A; without wheezes/ rales/ or rhonchi. HEART:  Regular Rhythm; without murmurs/ rubs/ or gallops. ABDOMEN:  Soft & nontender; normal bowel sounds; no organomegaly or masses detected. EXT: without deformities, mild arthritic changes; no varicose veins/ +venous insuffic/ no edema. NEURO:  CN's intact; motor testing normal; sensory testing normal; ?balance off but no specific gait abn noted. DERM:  No lesions noted; no rash etc..   Assessment & Plan:   Dizziness>  ?BPPV & we refilled her Meclizine, & reviewed Epley maneuver w/ her; she is already on ASA daily & she is concerned; therefore refer to Neurology for further eval...  HBP>  Controlled on BBlocker, ACE/CCB; we decided to decr the Metoprolol to 1/2 of the 100mg  tab in AM, & change the Lotrel to PM dosing; she will monitor her BP.  CHOL>  FLP is close to goal values on the diet & exercise program, no meds other than Fish Oil; continue same...  GERD>  Controlled on Nexium Bid...  DJD>  Followed by DrWainer, eval as above & she uses arthritis tylenol alone (  doesn't want stronger pain meds)...  Osteopenia>  Last BMD 5/11 was WNL & she is on a bisphos drug holiday...  Anxiety>  She is under a mod amt of stress & uses Alpraz prn.Marland KitchenMarland Kitchen

## 2010-09-05 ENCOUNTER — Encounter: Payer: Self-pay | Admitting: Pulmonary Disease

## 2010-09-13 DIAGNOSIS — N736 Female pelvic peritoneal adhesions (postinfective): Secondary | ICD-10-CM | POA: Insufficient documentation

## 2010-09-13 DIAGNOSIS — N920 Excessive and frequent menstruation with regular cycle: Secondary | ICD-10-CM | POA: Insufficient documentation

## 2010-09-13 DIAGNOSIS — R102 Pelvic and perineal pain: Secondary | ICD-10-CM | POA: Insufficient documentation

## 2010-09-18 ENCOUNTER — Encounter: Payer: Self-pay | Admitting: Neurology

## 2010-09-18 ENCOUNTER — Ambulatory Visit: Payer: Medicare Other | Admitting: Neurology

## 2010-09-24 ENCOUNTER — Encounter: Payer: Self-pay | Admitting: Obstetrics and Gynecology

## 2010-09-24 ENCOUNTER — Ambulatory Visit (INDEPENDENT_AMBULATORY_CARE_PROVIDER_SITE_OTHER): Payer: Medicare Other | Admitting: Obstetrics and Gynecology

## 2010-09-24 VITALS — BP 130/82 | Ht 66.0 in | Wt 182.0 lb

## 2010-09-24 DIAGNOSIS — N951 Menopausal and female climacteric states: Secondary | ICD-10-CM

## 2010-09-24 DIAGNOSIS — Z780 Asymptomatic menopausal state: Secondary | ICD-10-CM

## 2010-09-24 DIAGNOSIS — N9089 Other specified noninflammatory disorders of vulva and perineum: Secondary | ICD-10-CM

## 2010-09-24 DIAGNOSIS — N952 Postmenopausal atrophic vaginitis: Secondary | ICD-10-CM

## 2010-09-24 NOTE — Progress Notes (Signed)
Subjective:     Patient ID: Margaret Mitchell, female   DOB: [birth year], 70 y.o.   MRN: [id number]  HPIpatient came back to see me today for further followup. Last year we found a cyst on her perineum which we needed to followup. Patient is unaware that it's there. She remains on oral estrogen for vasomotor symptoms. She is not sexually active so is unaware of vaginal dryness. She is up-to-date on mammograms. She does bone densities through Dr. Adria Dill office. She's having no vaginal bleeding or pelvic pain.   Review of Systems  Constitutional: Negative.   HENT: Negative.   Eyes: Negative.   Respiratory: Negative.   Cardiovascular:       Hypertension  Gastrointestinal:       Irritable bowel. GERD. Hiatal hernia.  Genitourinary: Negative.   Musculoskeletal: Negative.   Neurological:       Gait and balance.  Hematological: Negative.   Psychiatric/Behavioral: Negative.        Objective:   Physical ExamHEENT: Within normal limits. Neck: No masses. Supraclavicular lymph nodes: Not enlarged. Breasts: Examined in both sitting and lying position. Symmetrical without skin changes or masses. Abdomen: Soft no masses guarding or rebound. No hernias. Pelvic: External within normal limits. BUS within normal limits. Vaginal examination shows poor estrogen effect, no cystocele enterocele or rectocele. Cervix and uterus absent. Adnexa within normal limits. Rectovaginal confirmatory. Extremities within normal limits.      Assessment:     1. Resolution of perineal cyst. 2. Menopausal symptoms. 3. Atrophic vaginitis.    Plan:     We had a long discussion of pros and cons of HRT. We discussed the issue of oral estrogen versus patch. She has tried 3 patches without success. We decided to keep her on oral estrogen. She does not need treatment for her vaginal dryness.

## 2010-10-15 LAB — COMPREHENSIVE METABOLIC PANEL
Albumin: 3.6
Alkaline Phosphatase: 61
BUN: 14
CO2: 25
Chloride: 107
Potassium: 3.6
Total Bilirubin: 0.6

## 2010-10-15 LAB — DIFFERENTIAL
Basophils Absolute: 0
Basophils Relative: 0
Eosinophils Relative: 1
Monocytes Absolute: 0.8
Neutro Abs: 3.8

## 2010-10-15 LAB — CBC
HCT: 39.7
Hemoglobin: 13.4
Platelets: 290
RBC: 4.26
WBC: 7.7

## 2010-10-15 LAB — POCT CARDIAC MARKERS
CKMB, poc: 1.1
Myoglobin, poc: 40.8
Troponin i, poc: <0.05

## 2010-10-22 ENCOUNTER — Encounter: Payer: Self-pay | Admitting: Obstetrics and Gynecology

## 2010-10-24 ENCOUNTER — Other Ambulatory Visit (INDEPENDENT_AMBULATORY_CARE_PROVIDER_SITE_OTHER): Payer: Medicare Other

## 2010-10-24 ENCOUNTER — Ambulatory Visit (INDEPENDENT_AMBULATORY_CARE_PROVIDER_SITE_OTHER): Payer: Medicare Other | Admitting: Neurology

## 2010-10-24 ENCOUNTER — Encounter: Payer: Self-pay | Admitting: Neurology

## 2010-10-24 DIAGNOSIS — R42 Dizziness and giddiness: Secondary | ICD-10-CM

## 2010-10-24 DIAGNOSIS — G609 Hereditary and idiopathic neuropathy, unspecified: Secondary | ICD-10-CM

## 2010-10-24 DIAGNOSIS — R7309 Other abnormal glucose: Secondary | ICD-10-CM

## 2010-10-24 DIAGNOSIS — R930 Abnormal findings on diagnostic imaging of skull and head, not elsewhere classified: Secondary | ICD-10-CM

## 2010-10-24 LAB — COMPREHENSIVE METABOLIC PANEL
Albumin: 4.1 g/dL (ref 3.5–5.2)
Alkaline Phosphatase: 73 U/L (ref 39–117)
BUN: 15 mg/dL (ref 6–23)
Glucose, Bld: 79 mg/dL (ref 70–99)
Total Bilirubin: 0.4 mg/dL (ref 0.3–1.2)

## 2010-10-24 LAB — HEMOGLOBIN A1C: Hgb A1c MFr Bld: 5.8 % (ref 4.6–6.5)

## 2010-10-24 LAB — CBC WITH DIFFERENTIAL/PLATELET
Basophils Relative: 0.3 % (ref 0.0–3.0)
Eosinophils Relative: 1.9 % (ref 0.0–5.0)
HCT: 39.6 % (ref 36.0–46.0)
MCV: 93.3 fl (ref 78.0–100.0)
Monocytes Absolute: 0.7 10*3/uL (ref 0.1–1.0)
Monocytes Relative: 8.6 % (ref 3.0–12.0)
Neutrophils Relative %: 53.4 % (ref 43.0–77.0)
RBC: 4.24 Mil/uL (ref 3.87–5.11)
WBC: 7.6 10*3/uL (ref 4.5–10.5)

## 2010-10-24 LAB — VITAMIN B12: Vitamin B-12: 849 pg/mL (ref 211–911)

## 2010-10-24 NOTE — Progress Notes (Signed)
Dear Dr. Kriste Basque,  Thank you for having me see Margaret Mitchell in consultation today at [facility] for her problem with acute onset of dizziness.  As you may recall, she is a 70 y.o. year old female with a history of hypertension, neck pain who presents with the acute onset of vertigo.  It was worse when she lay down and rolled over to the left.  It was persistent, but worse with movement.  She could not tell me how long the exacerbations lasted.  She was seen 8/17 in the emergency department and had a CT head which was negative for acute changes.  She was given meclizine and valium.  She has had gradual improvement of her symptoms, but still feels "dizzy" when she lies down in bed.  She denies neighborhood signs such as dysphagia, dysarthria or diplopia.  She has never had a similar spell before.  While she used to have significant neck pain and had epidural steroid injections for it, she has not had neck pain recently.   She has had no preceding infections or major or minor head trauma.  Past Medical History  Diagnosis Date  . Allergy, unspecified not elsewhere classified   . IBS (irritable bowel syndrome)   . Headache   . Anxiety   . Pelvic adhesions   . Pelvic pain in female   . Menorrhagia   . Osteoporosis   . Hypertension   . Thyroid disease     Parathyroid cyst    Past Surgical History  Procedure Date  . Parathyroid adenoma removed   . Vesicovaginal fistula closure w/ tah   . Tonsillectomy   . Appendectomy   . Abdominal hysterectomy 1983    TAH,BSO  . Tubal ligation 1981  . Rotator cuff repair     X2  . Foot surgery     RIGHT AND LEFT    History   Social History  . Marital Status: Married    Spouse Name: Charlann Noss    Number of Children: 1  . Years of Education: N/A   Occupational History  .     Social History Main Topics  . Smoking status: Never Smoker   . Smokeless tobacco: Never Used  . Alcohol Use: No  . Drug Use: No  . Sexually Active: Yes   Birth Control/ Protection: Surgical   Other Topics Concern  . None   Social History Narrative  . None    Family History  Problem Relation Age of Onset  . Diabetes Mother   . Cancer Father     LUNG  . Lung cancer Father   . Cancer Brother     LEUKEMIA  . Hypertension Maternal Grandmother   . Heart disease Maternal Grandmother   . Diabetes Maternal Grandmother   . Stroke Maternal Grandmother   . Diabetes Maternal Aunt       ROS:  13 systems were reviewed and are notable for arthritis, nausea accompaning the vertigo.  All other review of systems are unremarkable.   Examination:  Filed Vitals:   10/24/10 1322  BP: 116/74  Pulse: 68  Weight: 179 lb (81.194 kg)     In general, very well appearing woman in NAD.  Cardiovascular: The patient has a regular rate and rhythm and no carotid bruits.  Fundoscopy:  Disks are flat. Vessel caliber within normal limits.  Mental status:   The patient is oriented to person, place and time. Recent and remote memory are intact. Attention span and concentration  are normal. Language including repetition, naming, following commands are intact. Fund of knowledge of current and historical events, as well as vocabulary are normal.  Cranial Nerves: Pupils are equally round and reactive to light. Visual fields full to confrontation. Extraocular movements are intact without nystagmus. Facial sensation and muscles of mastication are intact. Muscles of facial expression are symmetric but she does have slight right ptosis which has been there for several years.  Hearing intact to bilateral finger rub. Tongue protrusion, uvula, palate midline.  Shoulder shrug intact.  Motor:  The patient has normal bulk and tone, no pronator drift.  There are no adventitious movements.  5/5 bilaterally.  Reflexes:   Biceps  Triceps Brachioradialis Knee Ankle  Right 1+  1+  1+   1+ 0 Left  1+  1+  1+   1+ 0  Toes down  Coordination:  Normal finger to nose.   No dysdiadokinesia.  Sensation is decreased distally in upper and lower extremities.  Gait and Station are normal.  Tandem gait is intact.  Romberg is negative.  Vestibular Testing:  no head shaking nystagmus, negative dix hallpike, negative head thrust, ? mildly positive fukuda walking test with rotation to the left.  CT head was reviewed and was only remarkable for calcification of the left basal ganglia.   Impression: Like benign paroxysmal positional vertigo, however, in the absence of a positive Dix-Hallpike and the persistence of symptoms a central cause cannot be excluded.  She also has an incidental peripheral neuropathy found on exam.  Recommendations: I am going to get an MRI brain, MRA head and neck to exclude a brain stem infarction.  I think this is unlikely.  The vertigo has improved quite a bit so we will see how she does.  If it recurs and I don't think it is a central cause then I will refer her to vestibular rehab.  I am also going to get peripheral neuropathy screening labs today.  We will see the patient back in 6 weeks.  Thank you for having Margaret Mitchell in consultation.  Feel free to contact me with any questions.  Lupita Raider Modesto Charon, MD [facility], [place] [address] [place], [place] [address] Phone: [phone] Fax: [phone].

## 2010-10-24 NOTE — Patient Instructions (Signed)
Go to the basement to have your labs drawn today.  Your MRI and MRA's are scheduled for Thursday, October 18th at 1:00pm.  Please arrive to [facility] by 12:45pm.  First floor admitting. [phone].

## 2010-10-26 LAB — PROTEIN ELECTROPHORESIS, SERUM: Total Protein, Serum Electrophoresis: 7 g/dL (ref 6.0–8.3)

## 2010-11-01 ENCOUNTER — Ambulatory Visit (HOSPITAL_COMMUNITY)
Admission: RE | Admit: 2010-11-01 | Discharge: 2010-11-01 | Disposition: A | Discharge status: Home / Self Care | Payer: Medicare Other | Source: Ambulatory Visit | Attending: Neurology | Admitting: Neurology

## 2010-11-01 DIAGNOSIS — R930 Abnormal findings on diagnostic imaging of skull and head, not elsewhere classified: Secondary | ICD-10-CM

## 2010-11-01 DIAGNOSIS — R42 Dizziness and giddiness: Secondary | ICD-10-CM

## 2010-11-01 DIAGNOSIS — R51 Headache: Secondary | ICD-10-CM | POA: Insufficient documentation

## 2010-11-01 DIAGNOSIS — J320 Chronic maxillary sinusitis: Secondary | ICD-10-CM | POA: Insufficient documentation

## 2010-11-01 DIAGNOSIS — I672 Cerebral atherosclerosis: Secondary | ICD-10-CM | POA: Insufficient documentation

## 2010-11-01 MED ORDER — GADOBENATE DIMEGLUMINE 529 MG/ML IV SOLN
17.0000 mL | Freq: Once | INTRAVENOUS | Status: AC
  Administered 2010-11-01: 17 mL via INTRAVENOUS

## 2010-11-02 ENCOUNTER — Other Ambulatory Visit: Payer: Self-pay

## 2010-11-02 DIAGNOSIS — G609 Hereditary and idiopathic neuropathy, unspecified: Secondary | ICD-10-CM

## 2010-11-05 ENCOUNTER — Telehealth: Payer: Self-pay

## 2010-11-05 LAB — METHYLMALONIC ACID, SERUM: Methylmalonic Acid, Quantitative: 74 nmol/L — ABNORMAL LOW (ref 87–318)

## 2010-11-05 NOTE — Telephone Encounter (Signed)
Message copied by Lelon Huh on Mon Nov 05, 2010  8:22 AM ------      Message from: Britt, [place] H      Created: Fri Nov 02, 2010 10:23 AM       MRI brain and MRA head and neck unremarkable.  Sed rate was elevated as was CRP.  I am not sure why, or whether this relates to her possible peripheral neuropathy.  We need to get her to get an EMG/NCS to confirm a peripheral neuropathy.  Then I will see her back and decided if we need further workup for whether her ESR is an incidental finding.              Dario Yono - could you set up the EMG/NCS, one arm and one leg, and let her know.  You can call her in about 1 hour (12 p.m.) on her home phone.  Thx!

## 2010-11-05 NOTE — Telephone Encounter (Signed)
Pt notified of EMG/NCS appt on 10/29 at 9:30am.  [facility].

## 2010-11-13 ENCOUNTER — Encounter: Payer: Self-pay | Admitting: Pulmonary Disease

## 2010-11-13 ENCOUNTER — Ambulatory Visit (INDEPENDENT_AMBULATORY_CARE_PROVIDER_SITE_OTHER): Payer: Medicare Other | Admitting: Pulmonary Disease

## 2010-11-13 ENCOUNTER — Ambulatory Visit (INDEPENDENT_AMBULATORY_CARE_PROVIDER_SITE_OTHER)
Admission: RE | Admit: 2010-11-13 | Discharge: 2010-11-13 | Disposition: A | Discharge status: Home / Self Care | Payer: Medicare Other | Source: Ambulatory Visit | Attending: Pulmonary Disease | Admitting: Pulmonary Disease

## 2010-11-13 DIAGNOSIS — I10 Essential (primary) hypertension: Secondary | ICD-10-CM

## 2010-11-13 DIAGNOSIS — M1990 Unspecified osteoarthritis, unspecified site: Secondary | ICD-10-CM

## 2010-11-13 DIAGNOSIS — Z23 Encounter for immunization: Secondary | ICD-10-CM

## 2010-11-13 DIAGNOSIS — F411 Generalized anxiety disorder: Secondary | ICD-10-CM

## 2010-11-13 DIAGNOSIS — E785 Hyperlipidemia, unspecified: Secondary | ICD-10-CM

## 2010-11-13 DIAGNOSIS — K589 Irritable bowel syndrome without diarrhea: Secondary | ICD-10-CM

## 2010-11-13 DIAGNOSIS — K219 Gastro-esophageal reflux disease without esophagitis: Secondary | ICD-10-CM

## 2010-11-13 DIAGNOSIS — K5900 Constipation, unspecified: Secondary | ICD-10-CM

## 2010-11-13 MED ORDER — FLUOCINONIDE-E 0.05 % EX CREA: TOPICAL_CREAM | Freq: Two times a day (BID) | CUTANEOUS | Status: DC

## 2010-11-13 NOTE — Patient Instructions (Signed)
Today we updated your med list in our EPIC system...    Continue your current medications the same...    We wrote a new prescription for a cream to help the itching from the skin keratoses> apply as needed.  Today we did your follow up CXR...    Please call the PHONE TREE in a few days for your results...    Dial [id number] & when prompted enter your patient number followed by the # symbol...    Your patient number is:  [id number]#  Call for any questions...  Let's plan a follow up visit in 6 months.Marland KitchenMarland Kitchen

## 2010-11-14 NOTE — Progress Notes (Signed)
Got EMG/NCS done 11/12/10 as ESR was elevated and I wanted to see if there were signs of demyelination.  Revealed a chronic L5-S1 radiculopathy on the right.  Surals intact.  There was mild slowing of the NCV in the peroneal and tibial, but Dr. Anne Hahn thought this may be due to proximal radiculopathy.  No signs of neuropathy.

## 2010-11-15 ENCOUNTER — Encounter: Payer: Self-pay | Admitting: Neurology

## 2010-11-28 ENCOUNTER — Encounter: Payer: Self-pay | Admitting: Pulmonary Disease

## 2010-11-28 NOTE — Progress Notes (Signed)
Subjective:    Patient ID: Margaret Mitchell, female    DOB: May 07, 1940, 70 y.o.   MRN: [id number]  HPI 70 y/o BF here for a follow up visit... she has mult med problems as noted below... she gets her meds from [facility]... she is the daughter of [name].  ~  November 07, 2009:  presents c/o sinus infection w/ pressure, HA, green drainage, sneezing, & cough... we discussed Rx w/ Depo, Augmentin, Saline, Mucinex... she also c/o "body cramps" evaluated by DrWainer & she says he put her on "mustard" but it is upsetting her stomach;  she has Soma350 & she is asked to incr this to Tid Prn;  she does describe some noct leg cramps- try tonic water Rx;  finally she reports DrWainer plans cortisone shots in neck (we don't have records)... she had BMD done 5/11 w/ normal TScores & now off Fosamax for drug holiday... she will get the 2011 Flu vaccine when she is over the current infection.  ~  March 08, 2010:  She was seen 11/11 by DrWert in my absence c/o cramps in her hands and feet> labs OK x Na=130 & BP was 130/80 so he switched Lotrel5-20 to Azor5-20, & rec incr Tonic water;  f/u TP 12/11 w/ persist cramps (no improvement w/ tonic water or Robaxin given by DrWainer) & rec to do stretching exercises;  then several phone calls> insurance refused Azor unless tried Losartan etc, so changed to Amlodip + Losartan but it made her feet feel "like they were in hot water";  after all this we decided to change back to the Lotrel 5-20 which she tolerated well & apparently had nothing to do w/ her cramps which she describes as a "lifetime prob" & does best w/ Alpraz Qhs...  ~  May 10, 2010:  ROV w/ fasting labs done recently & reviewed w/ pt> they look good, same meds, better diet & get weight down;  Her CC= neck pain w/ thorough eval by Ortho, DrWainer including XRays and 2 MRI's she says "I have a lot of arthritis & degenerating discs"> discussed rest, heat, Tylenol, etc (she does not want muscle  relaxers or pain pills)...  BP doing well on the Toprol & Lotrel> denies CP, palpit, dizzy, cerebral symptoms, SOB, edema, etc...  ~  September 04, 2010:  66mo ROV & post-ER check> she went to the ED 08/31/10 w/ dizziness, acute onset that day, unsteady on her feet, no focal neuro deficits on exam by DrZammit, LABS wnl, CTBrain w/ mild atrophy, sm vessel dis & NAD, given Valium & Antivert> states some better in the AM but gets worse in the afternoon;  It appears positional but not postural, yet BP today drops 20 points when standing (140 supine ==> 120 standing); We discussed continued Rx w/ Antivert, Valium, & try Epley maneuvers; we will adjust BP meds by decreasing the Metoprolol to 1/2 in AM & change the Lotrel to PM >> we will refer to Neuro for further suggestions...    We reviewed her ER Labs & Brain scan, plus her current med list (see below)...    She notes that her neck pain & arthritis are some improved w/ Tylenol & her Xanax...  ~  November 13, 2010:  60mo ROV & she saw DrWong, [facility] 10/12 for her dizziness & agreed that this was likely BPPV, plus he was concerned for a periph neuropathy on exam; he did MRI/MRA (small vessel dis- see report) and Periph  neuropathy labs (essentially neg x incr sed & crp); he plans EMG/NCV- pending...  She also saw DrGottsegen 9/12 for f/u of a perineal cyst & routine f/u> doing satis, no changes made & she remains on oral estrogen for her vasomotor symptoms... She reports BP ok at home & meds unchanged from prev- see prob list below & ok flu shot today...         Problem List:  Health Maintenance - she takes ASA 81mg /d... her GYN is DrGottsegen- on Premarin 0.625mg /d... [facility] at [facility] yearly... BMD here 5/11 w/ normal TScores & off Fosamax now for drug holiday. ~  Immunizations: she gets yearly Flu vaccines in the fall... had Pneumovax 2003 at age 56>  repeat PNEUMOVAX given 4/11 age 70.  ALLERGY (ICD-995.3) - Rx w/ Claritin, Saline, Flonase w/ prev ENT  eval by DrShoemaker. ~  10/11:  seen w/ sinusitis- Rx Augmentin, Depo, Mucinex, Saline...  HYPERTENSION (ICD-401.9) - on ASA 81mg /d, TOPROL XL 100mg - 1/2 tab in AM, & LOTREL 5/20 Daily in PM...  ~  CXR 4/11 was clear, WNL...  ~  2/12:  BP= 136/88 and similiar at home per pt in the 130/80 range... denies visual changes, CP, palipit, dizziness, syncope, dyspnea, edema, etc... she exercises by walking... ~  4/12:  BP= 114/68 and continues to feel well overall... ~  8/12:  BP= 120/70 sitting/ standing (140 supine) & we decided to decr the Metoprolol to 1/2 tab in AM & take the Lotrel in PM. ~  10/12:  [name]= 120/74 w/o postural change & she denies CP, palpit, SOB, edema, etc...  HYPERCHOLESTEROLEMIA, BORDERLINE (ICD-272.4) - on diet alone + Fish Oil... ~  FLP 4/10 (wt=203#) showed TChol 209, TG 116, HDL 67, LDL 105 ~  FLP 4/11 (wt=210#) showed TChol 175, TG 138, HDL 61, LDL 87 ~  FLP 4/12 (wt=211#) showed TChol 203, TG 169, HDL 65, LDL 105  GERD (ICD-530.81) - on NEXIUM 40mg Bid... ~  last EGD was 4/07 by DrPatterson showing chr GERD and gastric polyps...  ~  AbdSonar 2007 w/ no gallstones, negative sonar...  ~  HIDA Scan 5/09 was norm w/ eject fract 76% & patent ducts...  IRRITABLE BOWEL SYNDROME (ICD-564.1) -  ~  last colonoscopy 9/07 by DrPatterson showed melanosis only, no other abn...  CONSTIPATION (ICD-564.00) - on Metamucil etc...  DEGENERATIVE JOINT DISEASE (ICD-715.90) - she uses Arthritis Tylenol as needed... ~  10/11:  she reports followed by DrWainer w/ cortisone shots in neck planned- currently taking "mustard" for "body cramps" but it upset her stomach; asked to try tonic water for nocturnal leg cramps, and incr [id number] Tid Prn muscle spasm. ~  4/12:  She states she's had XRays & 2 MRIs from DrWainer w/ "alot of arthritis & degenerating discs"; given 2 shots- helped short term;  She stopped Soma & just uses Alpraz Prn...  OSTEOPOROSIS (ICD-733.00) - on Calcium and Vits w/ 1000  VitD... off prev fosamax w/ norm BMD... ~  BMD 4/09 much improved w/ TScores -0.2 to -0.7.Marland KitchenMarland Kitchen Fosamax decr to [employer]. ~  Vit D level 4/09 = 32... rec> Vit D 1000 u daily... ~  Vit D level 4/10 = 46... rec> continue same. ~  5/11:  BMD here showed TScores -0.7 in Spine, and -0.5 in left FemNeck... rec> stop Fosamax for drug holiday.  HEADACHE (ICD-784.0)  DIZZINESS >> Eval 10/12 by DrWong-Neurology w/ prob BPPV & he suggested vestib rehab... ~  MRI/ MRA showed sm vessel dis, otherw no acute changes, prob  hypoplastic right vertebral art...  ?PERIPHERAL NEUROPATHY >> ?Etiology w/ eval from DrWong 10/12 including PN Labs- all essentially wnl x sed/ crp... ~  EMG/NCV done in [place] due to her [employer] insurance ==> pending  ANXIETY (ICD-300.00) - on ALPRAZOLAM 0.5mg  as needed... her husb had an MI, cares for elderly mother.   Past Surgical History  Procedure Date  . Parathyroid adenoma removed   . Vesicovaginal fistula closure w/ tah   . Tonsillectomy   . Appendectomy   . Abdominal hysterectomy 1983    TAH,BSO  . Tubal ligation 1981  . Rotator cuff repair     X2  . Foot surgery     RIGHT AND LEFT    Outpatient Encounter Prescriptions as of 11/13/2010  Medication Sig Dispense Refill  . ALPRAZolam (XANAX) 0.5 MG tablet Take 1 tablet (0.5 mg total) by mouth 3 (three) times daily as needed.  90 tablet  5  . amLODipine-benazepril (LOTREL) 5-20 MG per capsule Take 1 capsule by mouth daily.        Marland Kitchen aspirin 81 MG tablet Take 81 mg by mouth daily.        . Calcium Carb-Cholecalciferol (CALCIUM 1000 + D PO) Take 1 capsule by mouth daily.        . Cholecalciferol (VITAMIN D) 1000 UNITS capsule Take 1,000 Units by mouth daily.        Marland Kitchen estrogens, conjugated, (PREMARIN) 0.625 MG tablet Take 0.625 mg by mouth daily.        . metoprolol (TOPROL-XL) 100 MG 24 hr tablet Take 1/2 tablet by mouth daily      . NEXIUM 40 MG capsule TAKE 1 CAPSULE BY MOUTH TWICE  A DAY 30 MINUTES BEFORE MEALS  180 capsule   0  . Omega-3 Fatty Acids (FISH OIL) 1000 MG CAPS Take as directed       . fluocinonide-emollient (LIDEX-E) 0.05 % cream Apply topically 2 (two) times daily. Apply as directed  60 g  5  . DISCONTD: Calcium Polycarbophil (FIBER) 625 MG TABS Take 2 tablets by mouth daily.        Marland Kitchen DISCONTD: meclizine (ANTIVERT) 25 MG tablet Take 1 tablet (25 mg total) by mouth 4 (four) times daily as needed for dizziness.  50 tablet  5  . DISCONTD: Multiple Vitamins-Minerals (MULTIVITAMIN WITH MINERALS) tablet Take 1 tablet by mouth daily.          Allergies  Allergen Reactions  . Amlodipine Besylate     REACTION: causes her feet to swell and burning  . Prednisone     REACTION: heart palpatations  In larger doses  . Rofecoxib     REACTION: mouth swelling (VIOXX)  . Sulfonamide Derivatives     REACTION: pt unable to remember  . Terfenadine     SELDANE    Current Medications, Allergies, Past Medical History, Past Surgical History, Family History, and Social History were reviewed in [employer] record.    Review of Systems        See HPI - all other systems neg except as noted... The patient complains of dyspnea on exertion.  The patient denies anorexia, fever, weight loss, weight gain, vision loss, decreased hearing, hoarseness, chest pain, syncope, peripheral edema, prolonged cough, headaches, hemoptysis, abdominal pain, melena, hematochezia, severe indigestion/heartburn, hematuria, incontinence, muscle weakness, suspicious skin lesions, transient blindness, difficulty walking, depression, unusual weight change, abnormal bleeding, enlarged lymph nodes, and angioedema.     Objective:   Physical Exam  WD, WN, 70 y/o BF in NAD... GENERAL:  Alert & oriented; pleasant & cooperative... HEENT:  Blunt/AT, EOM-wnl, PERRLA, no nystagmus, EACs-clear, TMs-wnl, NOSE- sl red w/ drainage, THROAT-clear & wnl. NECK:  Supple w/ fairROM; no JVD; normal carotid impulses w/o bruits; no thyromegaly  or nodules palpated; no lymphadenopathy. CHEST:  Clear to P & A; without wheezes/ rales/ or rhonchi. HEART:  Regular Rhythm; without murmurs/ rubs/ or gallops. ABDOMEN:  Soft & nontender; normal bowel sounds; no organomegaly or masses detected. EXT: without deformities, mild arthritic changes; no varicose veins/ +venous insuffic/ no edema. NEURO:  CN's intact; motor testing normal; sensory testing ?diminished peripherally; ?balance off but no specific gait abn noted. Decreased ankle jerk reflexes... DERM:  No lesions noted; no rash etc..   Assessment & Plan:   Dizziness>  ?BPPV & we refilled her Meclizine, & reviewed Epley maneuver w/ her; she is already on ASA daily w/ her known sm vessel dis; she is reassured by the Neurology eval from DrWong...  ?Peripheral Neuropathy>  She is getting work up from [employer]...   HBP>  Controlled on [employer], ACE/CCB; continue the Metoprolol at 1/2 of the 100mg  tab in AM, & the Lotrel PM dosing; she will monitor her BP.  CHOL>  FLP is close to goal values on the diet & exercise program, no meds other than Fish Oil; continue same...  GERD>  Controlled on Nexium Bid...  DJD>  Followed by DrWainer, eval as above & she uses arthritis tylenol alone (doesn't want stronger pain meds)...  Osteopenia>  Last BMD 5/11 was WNL & she is on a bisphos drug holiday...  Anxiety>  She is under a mod amt of stress & uses Alpraz prn.Marland KitchenMarland Kitchen

## 2010-12-12 ENCOUNTER — Ambulatory Visit (INDEPENDENT_AMBULATORY_CARE_PROVIDER_SITE_OTHER): Payer: Medicare Other | Admitting: Neurology

## 2010-12-12 ENCOUNTER — Encounter: Payer: Self-pay | Admitting: Neurology

## 2010-12-12 VITALS — BP 118/74 | HR 62 | Wt 181.9 lb

## 2010-12-12 DIAGNOSIS — R42 Dizziness and giddiness: Secondary | ICD-10-CM

## 2010-12-12 MED ORDER — ROPINIROLE HCL 0.5 MG PO TABS: ORAL_TABLET | ORAL | Status: DC

## 2010-12-12 NOTE — Progress Notes (Signed)
Dear Dr. Kriste Basque,  I saw  Margaret Mitchell back in [facility] for her problem with acute vertigo.  As you may recall, she is a 70 y.o. year old female with a history of presumed restless leg syndrome, HTN and HLD who had the acute onset of vertigo about 2 months ago.  I felt it was BPPV but got an MRI/MRA which revealed no obvious stroke or vascular abnormality.  I also was concerned about a possible peripheral neuropathy and got screening labs, which revealed a mildly elevated CRP and ESR.  However, an EMG/NCS did not reveal any sensorimotor neuropathy.  Her vertigo has resolved -- she feels back to normal from a dizziness stand point.  She describes more of a  feeling of tingling when she sits or lies down.  It appears to be more RLS like, and in fact she has been given a trial of ropinirole, but does not remember if it worked.  In addition she complains of dull headaches, bilateral, that have been worse since her attack of vertigo.  She is taking regular Tylenol for them with some relief.  She does have a history of similar headaches in the past.  Medical history, social history, and family history were reviewed and have not changed since the last clinic visit.  Current Outpatient Prescriptions on File Prior to Visit  Medication Sig Dispense Refill  . ALPRAZolam (XANAX) 0.5 MG tablet Take 1 tablet (0.5 mg total) by mouth 3 (three) times daily as needed.  90 tablet  5  . amLODipine-benazepril (LOTREL) 5-20 MG per capsule Take 1 capsule by mouth daily.        Marland Kitchen aspirin 81 MG tablet Take 81 mg by mouth daily.        . Calcium Carb-Cholecalciferol (CALCIUM 1000 + D PO) Take 1 capsule by mouth daily.        . Cholecalciferol (VITAMIN D) 1000 UNITS capsule Take 1,000 Units by mouth daily.        Marland Kitchen estrogens, conjugated, (PREMARIN) 0.625 MG tablet Take 0.625 mg by mouth daily.        . fluocinonide-emollient (LIDEX-E) 0.05 % cream Apply topically 2 (two) times daily. Apply as directed   60 g  5  . metoprolol (TOPROL-XL) 100 MG 24 hr tablet Take 1/2 tablet by mouth daily      . NEXIUM 40 MG capsule TAKE 1 CAPSULE BY MOUTH TWICE  A DAY 30 MINUTES BEFORE MEALS  180 capsule  0  . Omega-3 Fatty Acids (FISH OIL) 1000 MG CAPS Take as directed         Allergies  Allergen Reactions  . Amlodipine Besylate     REACTION: causes her feet to swell and burning  . Prednisone     REACTION: heart palpatations  In larger doses  . Rofecoxib     REACTION: mouth swelling (VIOXX)  . Sulfonamide Derivatives     REACTION: pt unable to remember  . Terfenadine     SELDANE    ROS:  13 systems were reviewed and are notable for for a lack of jaw pain with chewing, she does get back pain from "arthritis".  All other review of systems are unremarkable.  Exam: . Filed Vitals:   12/12/10 1020  BP: 118/74  Pulse: 62  Weight: 181 lb 14.4 oz (82.509 kg)    In general, well appearing woman in NAD.  Mental status:   The patient is oriented to person, place and time. Recent and  remote memory are intact. Attention span and concentration are normal. Language including repetition, naming, following commands are intact. Fund of knowledge of current and historical events, as well as vocabulary are normal.  Fundoscopy:  flat disks  Cranial Nerves: Pupils are equally round and reactive to light Extraocular movements are intact without nystagmus. . Muscles of facial expression are symmetric. Hearing intact to bilateral finger rub. Tongue protrusion, uvula, palate midline.  Shoulder shrug intact  Motor:  Normal bulk and tone, no drift and 5/5 muscle strength bilaterally.  Reflexes:  Quiet throughout.   Gait:  Normal gait and station.    Impression/Recs: 1.  Vertigo - resolved, likely BPPV with no sign of central abnormality 2.  ?Peripheral neuropathy - no evidence on EMG/NCS, not sure what to make of elevated ESR but likely not related. 3.  ?RLS - her sensory symptoms may be more RLS like, I  have given her ropinirole 0.5mg  qhs to try, and we can use it throughout the day if it works at night. 4.  Headaches - not sure what etiology, but have low suspicion of TA, given their similarity to previous headaches.  I have advised use of Aleve as it can be better for preventing medication overuse.  If these continue to be a problem we can try a preventative such as nortriptyline.  We will see the patient back in 4-6 weeks.  Lupita Raider Modesto Charon, MD [facility], [place]

## 2010-12-24 ENCOUNTER — Telehealth: Payer: Self-pay | Admitting: Pulmonary Disease

## 2010-12-24 MED ORDER — AMOXICILLIN-POT CLAVULANATE 875-125 MG PO TABS: ORAL_TABLET | ORAL | Status: DC

## 2010-12-24 NOTE — Telephone Encounter (Signed)
I spoke with Margaret Mitchell and she c/o sinus headache, facial pressure, nasal congestion, blowing out green phlem, hoarseness, drainage down back of throat, burning sensation in her throat x Thursday. Margaret Mitchell has been taking mucinex DM and sinus night and day. Margaret Mitchell denies any fever. Margaret Mitchell is requesting recs from Margaret Mitchell. Please advise, thanks  Allergies  Allergen Reactions  . Amlodipine Besylate     REACTION: causes her feet to swell and burning  . Prednisone     REACTION: heart palpatations  In larger doses  . Rofecoxib     REACTION: mouth swelling (VIOXX)  . Sulfonamide Derivatives     REACTION: Margaret Mitchell unable to remember  . Terfenadine     SELDANE

## 2010-12-24 NOTE — Telephone Encounter (Signed)
Pt aware of SN recs and rx has been sent.

## 2010-12-24 NOTE — Telephone Encounter (Signed)
Per SN---ok for pt to have augmentin 875mg    #20  1 po bid until gone.  thanks

## 2011-01-15 HISTORY — PX: CATARACT EXTRACTION: SUR2

## 2011-01-23 ENCOUNTER — Encounter: Payer: Self-pay | Admitting: Neurology

## 2011-01-23 ENCOUNTER — Ambulatory Visit (INDEPENDENT_AMBULATORY_CARE_PROVIDER_SITE_OTHER): Payer: Medicare Other | Admitting: Neurology

## 2011-01-23 VITALS — BP 124/68 | HR 66 | Wt 184.0 lb

## 2011-01-23 DIAGNOSIS — R42 Dizziness and giddiness: Secondary | ICD-10-CM

## 2011-01-23 NOTE — Progress Notes (Signed)
Dear Dr. Kriste Basque,  I saw  Margaret Mitchell back in [name] for her problem with RLS, vertigo and headaches.  As you may recall, she is a 71 y.o. year old female with a history of vertigo, likely BPPV, who I saw last time because of headaches and what sounds like RLS.  I started her on ropinirole 0.5mg  and this has helped her RLS. However, she finds if she takes it just before bedtime that it keeps her awake.  She has taken it earlier in the day, and finds it helps her RLS during the day as well as allows her to sleep at night.  Her headaches seem to have stabilized - she did have a mild bifrontal headache with colored lights lasting minutes which she has not had before.  It was accompanied by some dizziness but not her previous vertigo.  Medical history, social history, and family history were reviewed and have not changed since the last clinic visit.  Current Outpatient Prescriptions on File Prior to Visit  Medication Sig Dispense Refill  . ALPRAZolam (XANAX) 0.5 MG tablet Take 1 tablet (0.5 mg total) by mouth 3 (three) times daily as needed.  90 tablet  5  . amLODipine-benazepril (LOTREL) 5-20 MG per capsule Take 1 capsule by mouth daily.        Marland Kitchen amoxicillin-clavulanate (AUGMENTIN) 875-125 MG per tablet 1 tablet twice a day until gone  20 tablet  0  . aspirin 81 MG tablet Take 81 mg by mouth daily.        . Calcium Carb-Cholecalciferol (CALCIUM 1000 + D PO) Take 1 capsule by mouth daily.        . Cholecalciferol (VITAMIN D) 1000 UNITS capsule Take 1,000 Units by mouth daily.        Marland Kitchen estrogens, conjugated, (PREMARIN) 0.625 MG tablet Take 0.625 mg by mouth daily.        . fluocinonide-emollient (LIDEX-E) 0.05 % cream Apply topically 2 (two) times daily. Apply as directed  60 g  5  . metoprolol (TOPROL-XL) 100 MG 24 hr tablet Take 1/2 tablet by mouth daily      . NEXIUM 40 MG capsule TAKE 1 CAPSULE BY MOUTH TWICE  A DAY 30 MINUTES BEFORE MEALS  180 capsule  0  . Omega-3 Fatty  Acids (FISH OIL) 1000 MG CAPS Take as directed       . rOPINIRole (REQUIP) 0.5 MG tablet take 1/2 tab before bed for 5 days, then increase to 1 tab at night from then on  60 tablet  1    Allergies  Allergen Reactions  . Amlodipine Besylate     REACTION: causes her feet to swell and burning  . Prednisone     REACTION: heart palpatations  In larger doses  . Rofecoxib     REACTION: mouth swelling (VIOXX)  . Sulfonamide Derivatives     REACTION: pt unable to remember  . Terfenadine     SELDANE    ROS:  13 systems were reviewed and were unremarkable.  Exam: . Filed Vitals:   01/23/11 1043  BP: 124/68  Pulse: 66  Weight: 184 lb (83.462 kg)    In general, well appearing older women.    Cranial Nerves: Pupils are equally round and reactive to light. Visual fields full to confrontation. Extraocular movements are intact without nystagmus. Facial sensation and muscles of mastication are intact. Muscles of facial expression are symmetric. Tongue protrusion, uvula, palate midline.  Shoulder shrug intact  Motor:  Normal bulk and tone, no drift and 5/5 muscle strength bilaterally.  Reflexes:  2+ thoughout, except absent ankles.  Coordination:  Normal finger to nose  Gait:  Normal gait and station.   Impression/Recs: 1.  Vertigo - improved, likely just BPPV 2.  RLS - she will continue to take her ropinirole earlier in the day which seems to relieve her RLS as well as let her sleep.   3.  Headaches - her episode with flashing lights in both eyes was likely a migrainous aura.  In the future we may want consider a preventative medication for her headaches -- at this visit we did not really address this properly.  However, I have asked her to return if her headaches get worse or she wants a preventative.  For now we will see her on an as needed basis.  We will see the patient back in 3 months.  Lupita Raider Modesto Charon, MD [facility], [place]

## 2011-02-11 ENCOUNTER — Ambulatory Visit (INDEPENDENT_AMBULATORY_CARE_PROVIDER_SITE_OTHER): Payer: Medicare Other | Admitting: Women's Health

## 2011-02-11 ENCOUNTER — Encounter: Payer: Self-pay | Admitting: Women's Health

## 2011-02-11 DIAGNOSIS — N898 Other specified noninflammatory disorders of vagina: Secondary | ICD-10-CM | POA: Diagnosis not present

## 2011-02-11 LAB — WET PREP FOR TRICH, YEAST, CLUE
Clue Cells Wet Prep HPF POC: NONE SEEN
Trich, Wet Prep: NONE SEEN
WBC, Wet Prep HPF POC: NONE SEEN

## 2011-02-11 MED ORDER — TERCONAZOLE 0.4 % VA CREA: 1.0000 | TOPICAL_CREAM | Freq: Every day | VAGINAL | Status: AC

## 2011-02-11 NOTE — Patient Instructions (Signed)
zostovac vaccine  

## 2011-02-11 NOTE — Progress Notes (Signed)
Patient ID: Margaret Mitchell, female   DOB: 1940-03-29, 71 y.o.   MRN: [id number] Presents with the complaint of vaginal discharge with a burning irritation. Denies any urinary symptoms. Completed a 10 day course of Augmentin for a sinus/ upper respiratory infection per primary care.  Exam: External genitalia slight erythema, speculum exam moderate amount of white curdy discharge was noted, wet prep positive for many yeast.  Yeast vaginitis  Plan: Terazol 7 one applicator at bedtime x7, prescription, proper use was given and reviewed. Instructed to call if no relief.

## 2011-02-21 ENCOUNTER — Ambulatory Visit (INDEPENDENT_AMBULATORY_CARE_PROVIDER_SITE_OTHER): Payer: Medicare Other | Admitting: Gynecology

## 2011-02-21 ENCOUNTER — Encounter: Payer: Self-pay | Admitting: Gynecology

## 2011-02-21 DIAGNOSIS — N898 Other specified noninflammatory disorders of vagina: Secondary | ICD-10-CM | POA: Diagnosis not present

## 2011-02-21 DIAGNOSIS — B373 Candidiasis of vulva and vagina: Secondary | ICD-10-CM

## 2011-02-21 DIAGNOSIS — N949 Unspecified condition associated with female genital organs and menstrual cycle: Secondary | ICD-10-CM

## 2011-02-21 DIAGNOSIS — N9489 Other specified conditions associated with female genital organs and menstrual cycle: Secondary | ICD-10-CM

## 2011-02-21 LAB — WET PREP FOR TRICH, YEAST, CLUE: Trich, Wet Prep: NONE SEEN

## 2011-02-21 MED ORDER — FLUCONAZOLE 200 MG PO TABS: 200.0000 mg | ORAL_TABLET | Freq: Every day | ORAL | Status: AC

## 2011-02-21 NOTE — Progress Notes (Signed)
Patient follows up having been treated the end of January by Harriett Sine for a yeast vaginitis with Terazol cream. She said that her vaginal symptoms seem to have gotten better but her vulvar itching has continued.  Exam with Sherrilyn Rist chaperone present External BUS vagina with whitish discharge. Bimanual without masses or tenderness  Assessment and plan: Wet prep is positive for yeast. We'll treat with Diflucan 200 daily x7 days to eradicate cutaneous involvement. Follow up if symptoms persist or recur.

## 2011-02-21 NOTE — Patient Instructions (Signed)
Take one Diflucan daily for 7 days. Follow up if symptoms persist or recur.

## 2011-02-22 ENCOUNTER — Other Ambulatory Visit: Payer: Self-pay | Admitting: Pulmonary Disease

## 2011-02-26 ENCOUNTER — Telehealth: Payer: Self-pay | Admitting: Pulmonary Disease

## 2011-02-26 MED ORDER — AMLODIPINE BESY-BENAZEPRIL HCL 5-20 MG PO CAPS: 1.0000 | ORAL_CAPSULE | Freq: Every day | ORAL | Status: DC

## 2011-02-26 MED ORDER — METOPROLOL SUCCINATE ER 100 MG PO TB24: ORAL_TABLET | ORAL | Status: DC

## 2011-02-26 NOTE — Telephone Encounter (Signed)
Called and spoke with pt. Pt is requesting 90 day rx x 3 refills for Lotrel and Toprol.  Pt will pick up rx when ready.  rx printed and put on SN's cart for him to sign.

## 2011-02-26 NOTE — Telephone Encounter (Signed)
rx have been signed by SN and placed up front for pt to pick these up.  Called and spoke with pt and she is aware.

## 2011-02-28 ENCOUNTER — Encounter: Payer: Self-pay | Admitting: Gynecology

## 2011-02-28 ENCOUNTER — Ambulatory Visit (INDEPENDENT_AMBULATORY_CARE_PROVIDER_SITE_OTHER): Payer: Medicare Other | Admitting: Gynecology

## 2011-02-28 DIAGNOSIS — N9489 Other specified conditions associated with female genital organs and menstrual cycle: Secondary | ICD-10-CM

## 2011-02-28 DIAGNOSIS — N898 Other specified noninflammatory disorders of vagina: Secondary | ICD-10-CM

## 2011-02-28 DIAGNOSIS — B373 Candidiasis of vulva and vagina: Secondary | ICD-10-CM

## 2011-02-28 DIAGNOSIS — B3731 Acute candidiasis of vulva and vagina: Secondary | ICD-10-CM

## 2011-02-28 DIAGNOSIS — N949 Unspecified condition associated with female genital organs and menstrual cycle: Secondary | ICD-10-CM

## 2011-02-28 LAB — WET PREP FOR TRICH, YEAST, CLUE

## 2011-02-28 MED ORDER — NYSTATIN-TRIAMCINOLONE 100000-0.1 UNIT/GM-% EX OINT: TOPICAL_OINTMENT | Freq: Two times a day (BID) | CUTANEOUS | Status: DC

## 2011-02-28 MED ORDER — NONFORMULARY OR COMPOUNDED ITEM: Status: DC

## 2011-02-28 MED ORDER — FLUCONAZOLE 200 MG PO TABS: 200.0000 mg | ORAL_TABLET | Freq: Every day | ORAL | Status: AC

## 2011-02-28 NOTE — Progress Notes (Signed)
Patient presents having been treated by Harriett Sine in January for yeast with Terazol 7 day. Got better but then had recurrence a week ago and I treated her with Diflucan 200 daily x7 days. The symptoms resolved for about 2 days now have returned. She has more itching and irritation.  Exam with Sherrilyn Rist chaperone present Pelvic external the rest vagina was atrophic genital changes a thick white discharge. Bimanual without masses or tenderness  Assessment and plan: Wet prep is positive for yeast. We'll treat with Diflucan 200 daily x7 days. Mytrex cream externally twice a day and boric acid suppositories 600 mg 1 per vagina twice weekly for 2 months. We'll see if this doesn't suppress her.

## 2011-02-28 NOTE — Patient Instructions (Signed)
Take Diflucan 1 daily for 7 days. Apply antifungal cream externally twice daily. Start boric acid suppositories twice weekly for 2-3 months.

## 2011-03-01 ENCOUNTER — Ambulatory Visit: Payer: Medicare Other | Admitting: Gynecology

## 2011-03-19 ENCOUNTER — Telehealth: Payer: Self-pay | Admitting: Pulmonary Disease

## 2011-03-19 NOTE — Telephone Encounter (Signed)
Called, spoke with pt.  States on Thursday she noticed a rash on the right side of her neck.  This area burned.  Pt reports yesterday the rash started spreading around the back of her neck.  It is now to the left side of her neck.  Pt states it itches, burns, and feels like something hot is being touched to the neck.  She has not tried any otc meds.  She would like recs on this.  Dr. Kriste Basque, pls advise.  Thanks!  [facility] Allergies verified Allergies  Allergen Reactions  . Amlodipine Besylate     REACTION: causes her feet to swell and burning  . Prednisone     REACTION: heart palpatations  In larger doses  . Rofecoxib     REACTION: mouth swelling (VIOXX)  . Sulfonamide Derivatives     REACTION: pt unable to remember  . Terfenadine     SELDANE

## 2011-03-19 NOTE — Telephone Encounter (Signed)
Per TP: needs ov to evaluate.  Can work-in tomorrow morning at 9 am.  LM on named machine for patient informing her that rash needs to be evaluated and appt with TP has been scheduled for 9am.  Advised to call in AM if unable to make the appt.  Will close message.

## 2011-03-20 ENCOUNTER — Encounter: Payer: Self-pay | Admitting: Adult Health

## 2011-03-20 ENCOUNTER — Ambulatory Visit (INDEPENDENT_AMBULATORY_CARE_PROVIDER_SITE_OTHER): Payer: Medicare Other | Admitting: Adult Health

## 2011-03-20 DIAGNOSIS — B029 Zoster without complications: Secondary | ICD-10-CM | POA: Insufficient documentation

## 2011-03-20 MED ORDER — VALACYCLOVIR HCL 1 G PO TABS: 1000.0000 mg | ORAL_TABLET | Freq: Three times a day (TID) | ORAL | Status: AC

## 2011-03-20 NOTE — Assessment & Plan Note (Signed)
Suspect Rash along right neck Is Shingles   Plan:  Valtrex 1 gram Three times a day  For 7 days  Capsacin cream to rash As needed  For pain  Tylenol As needed  Pain  Follow up as planned next month with Dr. Kriste Basque  and As needed   Please contact office for sooner follow up if symptoms do not improve or worsen or seek emergency care

## 2011-03-20 NOTE — Patient Instructions (Signed)
Valtrex 1 gram Three times a day  For 7 days  Capsacin cream to rash As needed  For pain  Tylenol As needed  Pain  Follow up as planned next month with Dr. Kriste Basque  and As needed   Please contact office for sooner follow up if symptoms do not improve or worsen or seek emergency care

## 2011-03-20 NOTE — Progress Notes (Signed)
Subjective:    Patient ID: Margaret Mitchell, female    DOB: 1940/06/27, 71 y.o.   MRN: [id number]  HPI 71 y/o BF -- has mult med problems as noted below... she gets her meds from [facility]... she is the daughter of Margaret Mitchell.  ~  November 07, 2009:  presents c/o sinus infection w/ pressure, HA, green drainage, sneezing, & cough... we discussed Rx w/ Depo, Augmentin, Saline, Mucinex... she also c/o "body cramps" evaluated by DrWainer & she says he put her on "mustard" but it is upsetting her stomach;  she has Soma350 & she is asked to incr this to Tid Prn;  she does describe some noct leg cramps- try tonic water Rx;  finally she reports DrWainer plans cortisone shots in neck (we don't have records)... she had BMD done 5/11 w/ normal TScores & now off Fosamax for drug holiday... she will get the 2011 Flu vaccine when she is over the current infection.  ~  March 08, 2010:  She was seen 11/11 by DrWert in my absence c/o cramps in her hands and feet> labs OK x Na=130 & BP was 130/80 so he switched Lotrel5-20 to Azor5-20, & rec incr Tonic water;  f/u TP 12/11 w/ persist cramps (no improvement w/ tonic water or Robaxin given by DrWainer) & rec to do stretching exercises;  then several phone calls> insurance refused Azor unless tried Losartan etc, so changed to Amlodip + Losartan but it made her feet feel "like they were in hot water";  after all this we decided to change back to the Lotrel 5-20 which she tolerated well & apparently had nothing to do w/ her cramps which she describes as a "lifetime prob" & does best w/ Alpraz Qhs...  ~  May 10, 2010:  ROV w/ fasting labs done recently & reviewed w/ pt> they look good, same meds, better diet & get weight down;  Her CC= neck pain w/ thorough eval by Ortho, DrWainer including XRays and 2 MRI's she says "I have a lot of arthritis & degenerating discs"> discussed rest, heat, Tylenol, etc (she does not want muscle relaxers or pain pills)...  BP doing  well on the Toprol & Lotrel> denies CP, palpit, dizzy, cerebral symptoms, SOB, edema, etc...  ~  September 04, 2010:  20mo ROV & post-ER check> she went to the ED 08/31/10 w/ dizziness, acute onset that day, unsteady on her feet, no focal neuro deficits on exam by DrZammit, LABS wnl, CTBrain w/ mild atrophy, sm vessel dis & NAD, given Valium & Antivert> states some better in the AM but gets worse in the afternoon;  It appears positional but not postural, yet BP today drops 20 points when standing (140 supine ==> 120 standing); We discussed continued Rx w/ Antivert, Valium, & try Epley maneuvers; we will adjust BP meds by decreasing the Metoprolol to 1/2 in AM & change the Lotrel to PM >> we will refer to Neuro for further suggestions...    We reviewed her ER Labs & Brain scan, plus her current med list (see below)...    She notes that her neck pain & arthritis are some improved w/ Tylenol & her Xanax...  ~  November 13, 2010:  59mo ROV & she saw DrWong, [facility] 10/12 for her dizziness & agreed that this was likely BPPV, plus he was concerned for a periph neuropathy on exam; he did MRI/MRA (small vessel dis- see report) and Periph neuropathy labs (essentially neg x incr  sed & crp); he plans EMG/NCV- pending...  She also saw DrGottsegen 9/12 for f/u of a perineal cyst & routine f/u> doing satis, no changes made & she remains on oral estrogen for her vasomotor symptoms... She reports BP ok at home & meds unchanged from prev- see prob list below & ok flu shot today...  ~03/20/2011 Acute OV  Complains of a  burning rash with raised bumps on the back of her neck x 6 days. Area burns at times, very sensitive to touch. Feels it is drying up . Feels like something has burned her at times. Pain comes and goes. No new meds or fever.  No otc meds used.         Problem List:  Health Maintenance - she takes ASA 81mg /d... her GYN is DrGottsegen- on Premarin 0.625mg /d... Mammograms at [facility] yearly... BMD here 5/11 w/  normal TScores & off Fosamax now for drug holiday. ~  Immunizations: she gets yearly Flu vaccines in the fall... had Pneumovax 2003 at age 65>  repeat PNEUMOVAX given 4/11 age 68.  ALLERGY (ICD-995.3) - Rx w/ Claritin, Saline, Flonase w/ prev ENT eval by DrShoemaker. ~  10/11:  seen w/ sinusitis- Rx Augmentin, Depo, Mucinex, Saline...  HYPERTENSION (ICD-401.9) - on ASA 81mg /d, TOPROL XL 100mg - 1/2 tab in AM, & LOTREL 5/20 Daily in PM...  ~  CXR 4/11 was clear, WNL...  ~  2/12:  BP= 136/88 and similiar at home per pt in the 130/80 range... denies visual changes, CP, palipit, dizziness, syncope, dyspnea, edema, etc... she exercises by walking... ~  4/12:  BP= 114/68 and continues to feel well overall... ~  8/12:  BP= 120/70 sitting/ standing (140 supine) & we decided to decr the Metoprolol to 1/2 tab in AM & take the Lotrel in PM. ~  10/12:  [name]= 120/74 w/o postural change & she denies CP, palpit, SOB, edema, etc...  HYPERCHOLESTEROLEMIA, BORDERLINE (ICD-272.4) - on diet alone + Fish Oil... ~  FLP 4/10 (wt=203#) showed TChol 209, TG 116, HDL 67, LDL 105 ~  FLP 4/11 (wt=210#) showed TChol 175, TG 138, HDL 61, LDL 87 ~  FLP 4/12 (wt=211#) showed TChol 203, TG 169, HDL 65, LDL 105  GERD (ICD-530.81) - on NEXIUM 40mg Bid... ~  last EGD was 4/07 by DrPatterson showing chr GERD and gastric polyps...  ~  AbdSonar 2007 w/ no gallstones, negative sonar...  ~  HIDA Scan 5/09 was norm w/ eject fract 76% & patent ducts...  IRRITABLE BOWEL SYNDROME (ICD-564.1) -  ~  last colonoscopy 9/07 by DrPatterson showed melanosis only, no other abn...  CONSTIPATION (ICD-564.00) - on Metamucil etc...  DEGENERATIVE JOINT DISEASE (ICD-715.90) - she uses Arthritis Tylenol as needed... ~  10/11:  she reports followed by DrWainer w/ cortisone shots in neck planned- currently taking "mustard" for "body cramps" but it upset her stomach; asked to try tonic water for nocturnal leg cramps, and incr [id number] Tid Prn muscle  spasm. ~  4/12:  She states she's had XRays & 2 MRIs from DrWainer w/ "alot of arthritis & degenerating discs"; given 2 shots- helped short term;  She stopped Soma & just uses Alpraz Prn...  OSTEOPOROSIS (ICD-733.00) - on Calcium and Vits w/ 1000 VitD... off prev fosamax w/ norm BMD... ~  BMD 4/09 much improved w/ TScores -0.2 to -0.7.Marland KitchenMarland Kitchen Fosamax decr to [employer]. ~  Vit D level 4/09 = 32... rec> Vit D 1000 u daily... ~  Vit D level 4/10 = 46... rec> continue same. ~  5/11:  BMD here showed TScores -0.7 in Spine, and -0.5 in left FemNeck... rec> stop Fosamax for drug holiday.  HEADACHE (ICD-784.0)  DIZZINESS >> Eval 10/12 by DrWong-Neurology w/ prob BPPV & he suggested vestib rehab... ~  MRI/ MRA showed sm vessel dis, otherw no acute changes, prob hypoplastic right vertebral art...  ?PERIPHERAL NEUROPATHY >> ?Etiology w/ eval from DrWong 10/12 including PN Labs- all essentially wnl x sed/ crp... ~  EMG/NCV done in [place] due to her [employer] insurance ==> pending  ANXIETY (ICD-300.00) - on ALPRAZOLAM 0.5mg  as needed... her husb had an MI, cares for elderly mother.   Past Surgical History  Procedure Date  . Parathyroid adenoma removed   . Vesicovaginal fistula closure w/ tah   . Tonsillectomy   . Appendectomy   . Abdominal hysterectomy 1983    TAH,BSO  . Tubal ligation 1981  . Rotator cuff repair     X2  . Foot surgery     RIGHT AND LEFT    Outpatient Encounter Prescriptions as of 03/20/2011  Medication Sig Dispense Refill  . ALPRAZolam (XANAX) 0.5 MG tablet Take 1 tablet (0.5 mg total) by mouth 3 (three) times daily as needed.  90 tablet  5  . amLODipine-benazepril (LOTREL) 5-20 MG per capsule Take 1 capsule by mouth daily.  90 capsule  3  . aspirin 81 MG tablet Take 81 mg by mouth daily.        . Calcium Carb-Cholecalciferol (CALCIUM 1000 + D PO) Take 1 capsule by mouth daily.        . Cholecalciferol (VITAMIN D) 1000 UNITS capsule Take 1,000 Units by mouth daily.        Marland Kitchen estrogens,  conjugated, (PREMARIN) 0.625 MG tablet Take 0.625 mg by mouth daily.        . fluocinonide-emollient (LIDEX-E) 0.05 % cream Apply topically 2 (two) times daily. Apply as directed  60 g  5  . metoprolol succinate (TOPROL-XL) 100 MG 24 hr tablet Take 1/2 tablet by mouth daily  45 tablet  3  . NEXIUM 40 MG capsule TAKE 1 CAPSULE BY MOUTH TWICE  A DAY 30 MINUTES BEFORE MEALS  180 capsule  0  . NONFORMULARY OR COMPOUNDED ITEM Boric acid suppository 600 mg one per vagina 2 times weekly  30 each  1  . nystatin-triamcinolone ointment (MYCOLOG) Apply topically 2 (two) times daily.  30 g  0  . Omega-3 Fatty Acids (FISH OIL) 1000 MG CAPS Take as directed       . rOPINIRole (REQUIP) 0.5 MG tablet take 1/2 tab before bed for 5 days, then increase to 1 tab at night from then on  60 tablet  1    Allergies  Allergen Reactions  . Amlodipine Besylate     REACTION: causes her feet to swell and burning  . Prednisone     REACTION: heart palpatations  In larger doses  . Rofecoxib     REACTION: mouth swelling (VIOXX)  . Sulfonamide Derivatives     REACTION: pt unable to remember  . Terfenadine     SELDANE    Current Medications, Allergies, Past Medical History, Past Surgical History, Family History, and Social History were reviewed in [employer] record.    Review of Systems        Constitutional:   No  weight loss, night sweats,  Fevers, chills, fatigue, or  lassitude.  HEENT:   No headaches,  Difficulty swallowing,  Tooth/dental problems, or  Sore throat,  No sneezing, itching, ear ache, nasal congestion, post nasal drip,   CV:  No chest pain,  Orthopnea, PND, swelling in lower extremities, anasarca, dizziness, palpitations, syncope.   GI  No heartburn, indigestion, abdominal pain, nausea, vomiting, diarrhea, change in bowel habits, loss of appetite, bloody stools.   Resp: No shortness of breath with exertion or at rest.  No excess mucus, no productive cough,  No  non-productive cough,  No coughing up of blood.  No change in color of mucus.  No wheezing.  No chest wall deformity  Skin: +rash   GU: no dysuria, change in color of urine, no urgency or frequency.  No flank pain, no hematuria   MS:  No joint pain or swelling.  No decreased range of motion.  No back pain.  Psych:  No change in mood or affect. No depression or anxiety.  No memory loss.       Objective:   Physical Exam      WD, WN, 70 y/o BF in NAD... GENERAL:  Alert & oriented; pleasant & cooperative... HEENT:  [place]/AT, EOM-wnl, PERRLA, no nystagmus, EACs-clear, TMs-wnl, NOSE-clear  THROAT-clear & wnl. NECK:  Supple w/ fairROM; no JVD; normal carotid impulses w/o bruits; no thyromegaly or nodules palpated; no lymphadenopathy. CHEST:  Clear to P & A; without wheezes/ rales/ or rhonchi. HEART:  Regular Rhythm; without murmurs/ rubs/ or gallops. ABDOMEN:  Soft & nontender; normal bowel sounds; no organomegaly or masses detected. EXT: without deformities, mild arthritic changes; no varicose veins/ +venous insuffic/ no edema. NEURO:  No focal deficits noted. DERM: along right side of neck patch of vesicles and crusted lesions noted. -stops at midline post. Neck    Assessment & Plan:

## 2011-04-02 ENCOUNTER — Other Ambulatory Visit: Payer: Self-pay | Admitting: Neurology

## 2011-04-02 MED ORDER — ROPINIROLE HCL 0.5 MG PO TABS: ORAL_TABLET | ORAL | Status: DC

## 2011-04-17 ENCOUNTER — Telehealth: Payer: Self-pay | Admitting: Pulmonary Disease

## 2011-04-17 NOTE — Telephone Encounter (Signed)
I spoke with pt and she states she is wanting to get handicap placard. She states she has a lot of cramps in her legs and it's just getting worse when she has to walk a long ways. Please advise SN thanks

## 2011-04-18 NOTE — Telephone Encounter (Signed)
Called and lmom to make pt aware of her handicap placard is ready to be picked up.

## 2011-05-08 DIAGNOSIS — H2510 Age-related nuclear cataract, unspecified eye: Secondary | ICD-10-CM | POA: Diagnosis not present

## 2011-05-08 DIAGNOSIS — H5210 Myopia, unspecified eye: Secondary | ICD-10-CM | POA: Diagnosis not present

## 2011-05-14 ENCOUNTER — Encounter: Payer: Self-pay | Admitting: Pulmonary Disease

## 2011-05-14 ENCOUNTER — Other Ambulatory Visit (INDEPENDENT_AMBULATORY_CARE_PROVIDER_SITE_OTHER): Payer: Medicare Other

## 2011-05-14 ENCOUNTER — Ambulatory Visit (INDEPENDENT_AMBULATORY_CARE_PROVIDER_SITE_OTHER): Payer: Medicare Other | Admitting: Pulmonary Disease

## 2011-05-14 VITALS — BP 128/78 | HR 68 | Temp 97.0°F | Ht 66.5 in | Wt 183.4 lb

## 2011-05-14 DIAGNOSIS — R51 Headache: Secondary | ICD-10-CM

## 2011-05-14 DIAGNOSIS — M1990 Unspecified osteoarthritis, unspecified site: Secondary | ICD-10-CM

## 2011-05-14 DIAGNOSIS — K219 Gastro-esophageal reflux disease without esophagitis: Secondary | ICD-10-CM | POA: Diagnosis not present

## 2011-05-14 DIAGNOSIS — M810 Age-related osteoporosis without current pathological fracture: Secondary | ICD-10-CM

## 2011-05-14 DIAGNOSIS — I10 Essential (primary) hypertension: Secondary | ICD-10-CM

## 2011-05-14 DIAGNOSIS — F411 Generalized anxiety disorder: Secondary | ICD-10-CM

## 2011-05-14 DIAGNOSIS — K589 Irritable bowel syndrome without diarrhea: Secondary | ICD-10-CM

## 2011-05-14 DIAGNOSIS — E785 Hyperlipidemia, unspecified: Secondary | ICD-10-CM | POA: Diagnosis not present

## 2011-05-14 DIAGNOSIS — K5900 Constipation, unspecified: Secondary | ICD-10-CM

## 2011-05-14 LAB — CBC WITH DIFFERENTIAL/PLATELET
Basophils Relative: 0.6 % (ref 0.0–3.0)
Eosinophils Relative: 1.2 % (ref 0.0–5.0)
HCT: 40.4 % (ref 36.0–46.0)
Lymphs Abs: 2.9 10*3/uL (ref 0.7–4.0)
MCV: 94.1 fl (ref 78.0–100.0)
Monocytes Absolute: 0.5 10*3/uL (ref 0.1–1.0)
Monocytes Relative: 7.4 % (ref 3.0–12.0)
Neutrophils Relative %: 50.2 % (ref 43.0–77.0)
Platelets: 282 10*3/uL (ref 150.0–400.0)
RBC: 4.3 Mil/uL (ref 3.87–5.11)
WBC: 7.1 10*3/uL (ref 4.5–10.5)

## 2011-05-14 LAB — TSH: TSH: 1.6 u[IU]/mL (ref 0.35–5.50)

## 2011-05-14 LAB — BASIC METABOLIC PANEL
BUN: 18 mg/dL (ref 6–23)
Creatinine, Ser: 0.9 mg/dL (ref 0.4–1.2)
GFR: 83.03 mL/min (ref >60.00)
Glucose, Bld: 87 mg/dL (ref 70–99)

## 2011-05-14 LAB — HEPATIC FUNCTION PANEL
Albumin: 4 g/dL (ref 3.5–5.2)
Total Bilirubin: 0.7 mg/dL (ref 0.3–1.2)

## 2011-05-14 LAB — LIPID PANEL
Cholesterol: 189 mg/dL (ref 0–200)
HDL: 83.2 mg/dL (ref >39.00)
LDL Cholesterol: 82 mg/dL (ref 0–99)
Triglycerides: 117 mg/dL (ref 0.0–149.0)
VLDL: 23.4 mg/dL (ref 0.0–40.0)

## 2011-05-14 MED ORDER — ALPRAZOLAM 0.5 MG PO TABS: 0.5000 mg | ORAL_TABLET | Freq: Three times a day (TID) | ORAL | Status: DC | PRN

## 2011-05-14 NOTE — Patient Instructions (Signed)
Today we updated your med list in our EPIC system...    Continue your current medications the same...    We refilled your alprazolam per request...  Today we did your follow up FASTING blood work...    We will call you w/ the results in a few days...  Keep up the good work w/ diet & exercise...  Call for any questions...  Let's plana follow up visit in 6 months.Marland KitchenMarland Kitchen

## 2011-05-14 NOTE — Progress Notes (Signed)
Subjective:    Patient ID: Margaret Mitchell, female    DOB: 31-May-1940, 71 y.o.   MRN: [id number]  HPI 71 y/o BF here for a follow up visit... she has mult med problems as noted below... she gets her meds from [facility]... she is the daughter of Margaret Mitchell.  ~  November 07, 2009:  presents c/o sinus infection w/ pressure, HA, green drainage, sneezing, & cough... we discussed Rx w/ Depo, Augmentin, Saline, Mucinex... she also c/o "body cramps" evaluated by DrWainer & she says he put her on "mustard" but it is upsetting her stomach;  she has Soma350 & she is asked to incr this to Tid Prn;  she does describe some noct leg cramps- try tonic water Rx;  finally she reports DrWainer plans cortisone shots in neck (we don't have records)... she had BMD done 5/11 w/ normal TScores & now off Fosamax for drug holiday... she will get the 2011 Flu vaccine when she is over the current infection.  ~  March 08, 2010:  She was seen 11/11 by DrWert in my absence c/o cramps in her hands and feet> labs OK x Na=130 & BP was 130/80 so he switched Lotrel5-20 to Azor5-20, & rec incr Tonic water;  f/u TP 12/11 w/ persist cramps (no improvement w/ tonic water or Robaxin given by DrWainer) & rec to do stretching exercises;  then several phone calls> insurance refused Azor unless tried Losartan etc, so changed to Amlodip + Losartan but it made her feet feel "like they were in hot water";  after all this we decided to change back to the Lotrel 5-20 which she tolerated well & apparently had nothing to do w/ her cramps which she describes as a "lifetime prob" & does best w/ Alpraz Qhs...  ~  May 10, 2010:  ROV w/ fasting labs done recently & reviewed w/ pt> they look good, same meds, better diet & get weight down;  Her CC= neck pain w/ thorough eval by Ortho, DrWainer including XRays and 2 MRI's she says "I have a lot of arthritis & degenerating discs"> discussed rest, heat, Tylenol, etc (she does not want muscle  relaxers or pain pills)...  BP doing well on the Toprol & Lotrel> denies CP, palpit, dizzy, cerebral symptoms, SOB, edema, etc...  ~  September 04, 2010:  64mo ROV & post-ER check> she went to the ED 08/31/10 w/ dizziness, acute onset that day, unsteady on her feet, no focal neuro deficits on exam by DrZammit, LABS wnl, CTBrain w/ mild atrophy, sm vessel dis & NAD, given Valium & Antivert> states some better in the AM but gets worse in the afternoon;  It appears positional but not postural, yet BP today drops 20 points when standing (140 supine ==> 120 standing); We discussed continued Rx w/ Antivert, Valium, & try Epley maneuvers; we will adjust BP meds by decreasing the Metoprolol to 1/2 in AM & change the Lotrel to PM >> we will refer to Neuro for further suggestions...    We reviewed her ER Labs & Brain scan, plus her current med list (see below)...    She notes that her neck pain & arthritis are some improved w/ Tylenol & her Xanax...  ~  November 13, 2010:  15mo ROV & she saw DrWong, [facility] 10/12 for her dizziness & agreed that this was likely BPPV, plus he was concerned for a periph neuropathy on exam; he did MRI/MRA (small vessel dis- see report) and Periph  neuropathy labs (essentially neg x incr sed & crp); he plans EMG/NCV- pending...  She also saw DrGottsegen 9/12 for f/u of a perineal cyst & routine f/u> doing satis, no changes made & she remains on oral estrogen for her vasomotor symptoms... She reports BP ok at home & meds unchanged from prev- see prob list below & ok flu shot today...   ~  May 14, 2011:  63mo ROV & [name] is doing reasonably well but very anxious in general & wonders if there is a test for Parkinsons because sometimes she shakes, wants BS checked because of +FamHx DM, sched for left eye cast surg soon & still caring for her mother Margaret Mitchell) in [facility];  As noted prev- she has seen DrWong for dizziness (+HAs/ RLS/ ?neuropathy) & he thought it was likely BPPV & Rx w/  Epley maneuvers;  She was seen by Gyn DrFontaine 2/13 (yeast treated w/ Diflucan) & his notes are reviewed in Epic;  Finally she saw TP 3/13 w/ Shingles right neck area & rash resolved on Rx... on the positive side she has lost some weight & labs are all wnl; she requests refill Alprazolam- ok...  We reviewed her problem list, medications, xrays & labs> see below>> LABS 4/13:  FLP- at goals on diet alone;  Chems- wnl;  CBC- wnl;  TSH=1.60         Problem List:  Health Maintenance - she takes ASA 81mg /d... her GYN is DrGottsegen- on Premarin 0.625mg /d... [facility]amm[facility] at [facility] yearly... BMD here 5/11 w/ normal TScores & off Fosamax now for drug holiday. ~  Immunizations: she gets yearly Flu vaccines in the fall... had Pneumovax 2003 at age 24>  repeat PNEUMOVAX given 4/11 age 33.  ALLERGY (ICD-995.3) - Rx w/ Claritin, Saline, Flonase w/ prev ENT eval by DrShoemaker. ~  10/11:  seen w/ sinusitis- Rx Augmentin, Depo, Mucinex, Saline...  HYPERTENSION (ICD-401.9) - on ASA 81mg /d, TOPROL XL 100mg - 1/2 tab in AM, & LOTREL 5/20 Daily in PM...  ~  CXR 4/11 was clear, WNL...  ~  2/12:  BP= 136/88 and similiar at home per pt in the 130/80 range... denies visual changes, CP, palipit, dizziness, syncope, dyspnea, edema, etc... she exercises by walking... ~  4/12:  BP= 114/68 and continues to feel well overall... ~  8/12:  BP= 120/70 sitting/ standing (140 supine) & we decided to decr the Metoprolol to 1/2 tab in AM & take the Lotrel in PM. ~  10/12:  BP= 120/74 w/o postural change & she denies CP, palpit, SOB, edema, etc... ~  CXR 10/12 showed normal heart size, clear lungs, NAD... ~  4/13:  BP= 128/78 & she denies HA, CP, palpit, SOB, edema, etc...  HYPERCHOLESTEROLEMIA, BORDERLINE (ICD-272.4) - on diet alone + Fish Oil... ~  FLP 4/10 (wt=203#) showed TChol 209, TG 116, HDL 67, LDL 105 ~  FLP 4/11 (wt=210#) showed TChol 175, TG 138, HDL 61, LDL 87 ~  FLP 4/12 (wt=211#) showed TChol 203, TG 169, HDL 65, LDL  105 ~  FLP 4/13 (wt=183#) showed TChol 189, TG 117, HDL 83, LDL 82... Great job w/ wt reduction!  GERD (ICD-530.81) - on NEXIUM 40mg Bid... ~  last EGD was 4/07 by DrPatterson showing chr GERD and gastric polyps...  ~  AbdSonar 2007 w/ no gallstones, negative sonar...  ~  HIDA Scan 5/09 was norm w/ eject fract 76% & patent ducts...  IRRITABLE BOWEL SYNDROME (ICD-564.1) -  ~  last colonoscopy 9/07 by DrPatterson showed  melanosis only, no other abn...  CONSTIPATION (ICD-564.00) - on Metamucil etc...  DEGENERATIVE JOINT DISEASE (ICD-715.90) - she uses Arthritis Tylenol as needed... ~  10/11:  she reports followed by DrWainer w/ cortisone shots in neck planned- currently taking "mustard" for "body cramps" but it upset her stomach; asked to try tonic water for nocturnal leg cramps, and incr [id number] Tid Prn muscle spasm. ~  4/12:  She states she's had XRays & 2 MRIs from DrWainer w/ "alot of arthritis & degenerating discs"; given 2 shots- helped short term;  She stopped Soma & just uses Alpraz Prn...  OSTEOPOROSIS (ICD-733.00) - on Calcium and Vits w/ 1000 VitD... off prev fosamax w/ norm BMD... ~  BMD 4/09 much improved w/ TScores -0.2 to -0.7.Marland KitchenMarland Kitchen Fosamax decr to [employer]. ~  Vit D level 4/09 = 32... rec> Vit D 1000 u daily... ~  Vit D level 4/10 = 46... rec> continue same. ~  5/11:  BMD here showed TScores -0.7 in Spine, and -0.5 in left FemNeck... rec> stop Fosamax for drug holiday.  HEADACHE (ICD-784.0) >> seen by DrWong w/ mult symptoms including HA, Dizzy, ?RLS, ?Neuropathy.>>  DIZZINESS >> Eval 10/12 by DrWong-Neurology w/ prob BPPV & he suggested vestib rehab... ~  MRI/ MRA showed sm vessel dis, otherw no acute changes, prob hypoplastic right vertebral art...  ?PERIPHERAL NEUROPATHY >> ?Etiology w/ eval from DrWong 10/12 including PN Labs- all essentially wnl x sed=35/ crp=2.37... ~  EMG/NCV done in [place] due to her [employer] insurance ==> evid for chr right L5-S1 radiculopathy ~  DrWong also  thought she had RLS & gave her a Requip trial...  ANXIETY (ICD-300.00) - on ALPRAZOLAM 0.5mg  as needed... her husb had an MI, cares for elderly mother.  S/P Shingles >> she had an outbreak on the right side of her neck 3/13 & treated by TP w/ Valtrex and resolved...    Past Surgical History  Procedure Date  . Parathyroid adenoma removed   . Vesicovaginal fistula closure w/ tah   . Tonsillectomy   . Appendectomy   . Abdominal hysterectomy 1983    TAH,BSO  . Tubal ligation 1981  . Rotator cuff repair     X2  . Foot surgery     RIGHT AND LEFT    Outpatient Encounter Prescriptions as of 05/14/2011  Medication Sig Dispense Refill  . ALPRAZolam (XANAX) 0.5 MG tablet Take 1 tablet (0.5 mg total) by mouth 3 (three) times daily as needed.  90 tablet  5  . amLODipine-benazepril (LOTREL) 5-20 MG per capsule Take 1 capsule by mouth daily.  90 capsule  3  . aspirin 81 MG tablet Take 81 mg by mouth daily.        . Calcium Carb-Cholecalciferol (CALCIUM 1000 + D PO) Take 1 capsule by mouth daily.        . Cholecalciferol (VITAMIN D) 1000 UNITS capsule Take 1,000 Units by mouth daily.        Marland Kitchen estrogens, conjugated, (PREMARIN) 0.625 MG tablet Take 0.625 mg by mouth daily.        . metoprolol succinate (TOPROL-XL) 100 MG 24 hr tablet Take 1/2 tablet by mouth daily  45 tablet  3  . NEXIUM 40 MG capsule TAKE 1 CAPSULE BY MOUTH TWICE  A DAY 30 MINUTES BEFORE MEALS  180 capsule  0  . NONFORMULARY OR COMPOUNDED ITEM Boric acid suppository 600 mg one per vagina 2 times weekly  30 each  1  . Omega-3 Fatty Acids (FISH OIL)  1000 MG CAPS Take as directed       . DISCONTD: fluocinonide-emollient (LIDEX-E) 0.05 % cream Apply topically 2 (two) times daily. Apply as directed  60 g  5  . DISCONTD: nystatin-triamcinolone ointment (MYCOLOG) Apply topically 2 (two) times daily.  30 g  0  . DISCONTD: rOPINIRole (REQUIP) 0.5 MG tablet Take 1(one) tab every night at HS.  30 tablet  3    Allergies  Allergen Reactions   . Amlodipine Besylate     REACTION: causes her feet to swell and burning  . Prednisone     REACTION: heart palpatations  In larger doses  . Rofecoxib     REACTION: mouth swelling (VIOXX)  . Sulfonamide Derivatives     REACTION: pt unable to remember  . Terfenadine     SELDANE    Current Medications, Allergies, Past Medical History, Past Surgical History, Family History, and Social History were reviewed in [employer] record.    Review of Systems        See HPI - all other systems neg except as noted... The patient complains of dyspnea on exertion.  The patient denies anorexia, fever, weight loss, weight gain, vision loss, decreased hearing, hoarseness, chest pain, syncope, peripheral edema, prolonged cough, headaches, hemoptysis, abdominal pain, melena, hematochezia, severe indigestion/heartburn, hematuria, incontinence, muscle weakness, suspicious skin lesions, transient blindness, difficulty walking, depression, unusual weight change, abnormal bleeding, enlarged lymph nodes, and angioedema.     Objective:   Physical Exam      WD, WN, 70 y/o BF in NAD... GENERAL:  Alert & oriented; pleasant & cooperative... HEENT:  [place]/AT, EOM-wnl, PERRLA, no nystagmus, EACs-clear, TMs-wnl, NOSE- sl red w/ drainage, THROAT-clear & wnl. NECK:  Supple w/ fairROM; no JVD; normal carotid impulses w/o bruits; no thyromegaly or nodules palpated; no lymphadenopathy. CHEST:  Clear to P & A; without wheezes/ rales/ or rhonchi. HEART:  Regular Rhythm; without murmurs/ rubs/ or gallops. ABDOMEN:  Soft & nontender; normal bowel sounds; no organomegaly or masses detected. EXT: without deformities, mild arthritic changes; no varicose veins/ +venous insuffic/ no edema. NEURO:  CN's intact; motor testing normal; sensory testing ?diminished peripherally; ?balance off but no specific gait abn noted. Decreased ankle jerk reflexes... DERM:  No lesions noted; no rash etc..  RADIOLOGY DATA:   Reviewed in the EPIC EMR & discussed w/ the patient...  LABORATORY DATA:  Reviewed in the EPIC EMR & discussed w/ the patient...   Assessment & Plan:   Dizziness/ HA>  ?BPPV & we reviewed Epley maneuver w/ her; she is already on ASA daily w/ her known sm vessel dis; she is reassured by the Neurology eval from DrWong...  ?Peripheral Neuropathy/ ?RLS>  She is getting work up from [employer] given trial of Requip...   HBP>  Controlled on BBlocker, ACE/CCB; continue the Metoprolol at 1/2 of the 100mg  tab in AM, & the Lotrel PM dosing; she will monitor her BP.  CHOL>  FLP is close to goal values on the diet & exercise program, no meds other than Fish Oil; continue same...  GERD>  Controlled on Nexium Bid...  DJD>  Followed by DrWainer, eval as above & she uses arthritis tylenol alone (doesn't want stronger pain meds)...  Osteopenia>  Last BMD 5/11 was WNL & she is on a bisphos drug holiday...  Anxiety>  She is under a mod amt of stress & uses Alpraz prn...   Patient's Medications  New Prescriptions   No medications on file  Previous  Medications   AMLODIPINE-BENAZEPRIL (LOTREL) 5-20 MG PER CAPSULE    Take 1 capsule by mouth daily.   ASPIRIN 81 MG TABLET    Take 81 mg by mouth daily.     CALCIUM CARB-CHOLECALCIFEROL (CALCIUM 1000 + D PO)    Take 1 capsule by mouth daily.     CHOLECALCIFEROL (VITAMIN D) 1000 UNITS CAPSULE    Take 1,000 Units by mouth daily.     ESTROGENS, CONJUGATED, (PREMARIN) 0.625 MG TABLET    Take 0.625 mg by mouth daily.     METOPROLOL SUCCINATE (TOPROL-XL) 100 MG 24 HR TABLET    Take 1/2 tablet by mouth daily   NONFORMULARY OR COMPOUNDED ITEM    Boric acid suppository 600 mg one per vagina 2 times weekly   OMEGA-3 FATTY ACIDS (FISH OIL) 1000 MG CAPS    Take as directed   Modified Medications   Modified Medication Previous Medication   ALPRAZOLAM (XANAX) 0.5 MG TABLET ALPRAZolam (XANAX) 0.5 MG tablet      Take 1 tablet (0.5 mg total) by mouth 3 (three) times daily  as needed.    Take 1 tablet (0.5 mg total) by mouth 3 (three) times daily as needed.   NEXIUM 40 MG CAPSULE NEXIUM 40 MG capsule      TAKE 1 CAPSULE BY MOUTH TWICE  A DAY 30 MINUTES BEFORE MEALS    TAKE 1 CAPSULE BY MOUTH TWICE  A DAY 30 MINUTES BEFORE MEALS  Discontinued Medications   FLUOCINONIDE-EMOLLIENT (LIDEX-E) 0.05 % CREAM    Apply topically 2 (two) times daily. Apply as directed   NYSTATIN-TRIAMCINOLONE OINTMENT (MYCOLOG)    Apply topically 2 (two) times daily.   ROPINIROLE (REQUIP) 0.5 MG TABLET    Take 1(one) tab every night at HS.

## 2011-05-15 DIAGNOSIS — H2510 Age-related nuclear cataract, unspecified eye: Secondary | ICD-10-CM | POA: Diagnosis not present

## 2011-05-19 ENCOUNTER — Other Ambulatory Visit: Payer: Self-pay | Admitting: Pulmonary Disease

## 2011-05-20 DIAGNOSIS — M999 Biomechanical lesion, unspecified: Secondary | ICD-10-CM | POA: Diagnosis not present

## 2011-05-20 DIAGNOSIS — IMO0001 Reserved for inherently not codable concepts without codable children: Secondary | ICD-10-CM | POA: Diagnosis not present

## 2011-05-20 DIAGNOSIS — M545 Low back pain: Secondary | ICD-10-CM | POA: Diagnosis not present

## 2011-05-20 DIAGNOSIS — M5137 Other intervertebral disc degeneration, lumbosacral region: Secondary | ICD-10-CM | POA: Diagnosis not present

## 2011-05-22 DIAGNOSIS — M5137 Other intervertebral disc degeneration, lumbosacral region: Secondary | ICD-10-CM | POA: Diagnosis not present

## 2011-05-22 DIAGNOSIS — IMO0001 Reserved for inherently not codable concepts without codable children: Secondary | ICD-10-CM | POA: Diagnosis not present

## 2011-05-22 DIAGNOSIS — H2589 Other age-related cataract: Secondary | ICD-10-CM | POA: Diagnosis not present

## 2011-05-22 DIAGNOSIS — H2510 Age-related nuclear cataract, unspecified eye: Secondary | ICD-10-CM | POA: Diagnosis not present

## 2011-05-22 DIAGNOSIS — H269 Unspecified cataract: Secondary | ICD-10-CM | POA: Diagnosis not present

## 2011-05-22 DIAGNOSIS — M999 Biomechanical lesion, unspecified: Secondary | ICD-10-CM | POA: Diagnosis not present

## 2011-05-22 DIAGNOSIS — M545 Low back pain: Secondary | ICD-10-CM | POA: Diagnosis not present

## 2011-05-27 DIAGNOSIS — IMO0001 Reserved for inherently not codable concepts without codable children: Secondary | ICD-10-CM | POA: Diagnosis not present

## 2011-05-27 DIAGNOSIS — M545 Low back pain: Secondary | ICD-10-CM | POA: Diagnosis not present

## 2011-05-27 DIAGNOSIS — M5137 Other intervertebral disc degeneration, lumbosacral region: Secondary | ICD-10-CM | POA: Diagnosis not present

## 2011-05-27 DIAGNOSIS — M999 Biomechanical lesion, unspecified: Secondary | ICD-10-CM | POA: Diagnosis not present

## 2011-05-29 DIAGNOSIS — M545 Low back pain: Secondary | ICD-10-CM | POA: Diagnosis not present

## 2011-05-29 DIAGNOSIS — IMO0001 Reserved for inherently not codable concepts without codable children: Secondary | ICD-10-CM | POA: Diagnosis not present

## 2011-05-29 DIAGNOSIS — M999 Biomechanical lesion, unspecified: Secondary | ICD-10-CM | POA: Diagnosis not present

## 2011-05-29 DIAGNOSIS — M5137 Other intervertebral disc degeneration, lumbosacral region: Secondary | ICD-10-CM | POA: Diagnosis not present

## 2011-06-17 DIAGNOSIS — M76829 Posterior tibial tendinitis, unspecified leg: Secondary | ICD-10-CM | POA: Diagnosis not present

## 2011-06-19 DIAGNOSIS — IMO0001 Reserved for inherently not codable concepts without codable children: Secondary | ICD-10-CM | POA: Diagnosis not present

## 2011-06-19 DIAGNOSIS — M545 Low back pain: Secondary | ICD-10-CM | POA: Diagnosis not present

## 2011-06-19 DIAGNOSIS — M5137 Other intervertebral disc degeneration, lumbosacral region: Secondary | ICD-10-CM | POA: Diagnosis not present

## 2011-06-19 DIAGNOSIS — M999 Biomechanical lesion, unspecified: Secondary | ICD-10-CM | POA: Diagnosis not present

## 2011-06-26 DIAGNOSIS — IMO0001 Reserved for inherently not codable concepts without codable children: Secondary | ICD-10-CM | POA: Diagnosis not present

## 2011-06-26 DIAGNOSIS — M5137 Other intervertebral disc degeneration, lumbosacral region: Secondary | ICD-10-CM | POA: Diagnosis not present

## 2011-06-26 DIAGNOSIS — M999 Biomechanical lesion, unspecified: Secondary | ICD-10-CM | POA: Diagnosis not present

## 2011-06-26 DIAGNOSIS — M545 Low back pain: Secondary | ICD-10-CM | POA: Diagnosis not present

## 2011-06-28 DIAGNOSIS — M545 Low back pain: Secondary | ICD-10-CM | POA: Diagnosis not present

## 2011-06-28 DIAGNOSIS — IMO0001 Reserved for inherently not codable concepts without codable children: Secondary | ICD-10-CM | POA: Diagnosis not present

## 2011-06-28 DIAGNOSIS — M5137 Other intervertebral disc degeneration, lumbosacral region: Secondary | ICD-10-CM | POA: Diagnosis not present

## 2011-06-28 DIAGNOSIS — M999 Biomechanical lesion, unspecified: Secondary | ICD-10-CM | POA: Diagnosis not present

## 2011-07-01 DIAGNOSIS — M545 Low back pain: Secondary | ICD-10-CM | POA: Diagnosis not present

## 2011-07-01 DIAGNOSIS — M999 Biomechanical lesion, unspecified: Secondary | ICD-10-CM | POA: Diagnosis not present

## 2011-07-01 DIAGNOSIS — M5137 Other intervertebral disc degeneration, lumbosacral region: Secondary | ICD-10-CM | POA: Diagnosis not present

## 2011-07-01 DIAGNOSIS — IMO0001 Reserved for inherently not codable concepts without codable children: Secondary | ICD-10-CM | POA: Diagnosis not present

## 2011-07-19 ENCOUNTER — Other Ambulatory Visit: Payer: Self-pay | Admitting: Pulmonary Disease

## 2011-07-19 MED ORDER — AMLODIPINE BESY-BENAZEPRIL HCL 5-20 MG PO CAPS: 1.0000 | ORAL_CAPSULE | Freq: Every day | ORAL | Status: DC

## 2011-07-19 MED ORDER — METOPROLOL SUCCINATE ER 100 MG PO TB24: ORAL_TABLET | ORAL | Status: DC

## 2011-07-22 ENCOUNTER — Telehealth: Payer: Self-pay | Admitting: Pulmonary Disease

## 2011-07-22 NOTE — Telephone Encounter (Signed)
I spoke with pt and she states she called [facility] and cancelled her rx through them for her toprol bc she has not filled that through them in years. Pt states she gets this through [place] and does not need this refilled yet. I advised will forward to [facility] nurse as an FYI for future.

## 2011-07-31 ENCOUNTER — Telehealth: Payer: Self-pay | Admitting: Pulmonary Disease

## 2011-07-31 MED ORDER — AMLODIPINE BESY-BENAZEPRIL HCL 5-20 MG PO CAPS: 1.0000 | ORAL_CAPSULE | Freq: Every day | ORAL | Status: DC

## 2011-07-31 MED ORDER — METOPROLOL SUCCINATE ER 100 MG PO TB24: ORAL_TABLET | ORAL | Status: DC

## 2011-07-31 NOTE — Telephone Encounter (Signed)
Refills had originally gone to the local pharmacy but need to be sent to [facility]. Spoke with the mail oirder pharmacy and will send these electronically.

## 2011-08-16 ENCOUNTER — Telehealth: Payer: Self-pay | Admitting: Pulmonary Disease

## 2011-08-16 NOTE — Telephone Encounter (Signed)
Called pt at # provided above - rang multiple times with no answer and no option to leave msg.  WCB

## 2011-08-16 NOTE — Telephone Encounter (Signed)
Pt returned call.  She reports feeling dizzy approx 20 minutes after taking her AM medications x2 weeks > denies nausea, unsteady gait, falls.  Believes this is from the lotrel.  However, pt takes all of her meds in the morning except for the metoprolol in the morning with only coffee and a breakfast bar.  Advised pt that her medications need to be taken with a good meal.  Pt is requesting SN's recs as well.  Dr Kriste Basque please advise, thanks.

## 2011-08-16 NOTE — Telephone Encounter (Signed)
Per SN: all BP's here going back for a year are fantastic. Hate to give up on the lotrel but he shares her concern for the new symptoms. Rec: Stop lotrel and start losartan 50 mg QD. Divide meds up in different times during the day. Needs ROV with TP in 2-3 weeks for review on new medication.   Pt is aware of SN recs. She voiced her understanding and is scheduled to see TP on 09/06/11. Pt states she is on her way to [place] and will call back for her rx to be sent to a pharmacy down there. Will hold message open for when pt call back for losartan rx to be called in

## 2011-08-19 MED ORDER — LOSARTAN POTASSIUM 50 MG PO TABS: 50.0000 mg | ORAL_TABLET | Freq: Every day | ORAL | Status: DC

## 2011-08-19 NOTE — Telephone Encounter (Signed)
Called pt and she states to send rx to [facility] on [address]. [address], [place]. Rx sent. Pt is aware. Carron Curie, CMA

## 2011-09-06 ENCOUNTER — Encounter: Payer: Self-pay | Admitting: Adult Health

## 2011-09-06 ENCOUNTER — Other Ambulatory Visit (INDEPENDENT_AMBULATORY_CARE_PROVIDER_SITE_OTHER): Payer: Medicare Other

## 2011-09-06 ENCOUNTER — Ambulatory Visit (INDEPENDENT_AMBULATORY_CARE_PROVIDER_SITE_OTHER): Payer: Medicare Other | Admitting: Adult Health

## 2011-09-06 VITALS — BP 146/82 | HR 54 | Temp 97.0°F | Ht 66.5 in | Wt 193.4 lb

## 2011-09-06 DIAGNOSIS — I10 Essential (primary) hypertension: Secondary | ICD-10-CM | POA: Diagnosis not present

## 2011-09-06 DIAGNOSIS — R42 Dizziness and giddiness: Secondary | ICD-10-CM

## 2011-09-06 LAB — BASIC METABOLIC PANEL
CO2: 26 mEq/L (ref 19–32)
Calcium: 9 mg/dL (ref 8.4–10.5)
Chloride: 104 mEq/L (ref 96–112)
Glucose, Bld: 85 mg/dL (ref 70–99)
Sodium: 138 mEq/L (ref 135–145)

## 2011-09-06 NOTE — Assessment & Plan Note (Signed)
BPV w/ extensive workup from Neuro in past  Advised on position changes  epleys manevrs.   Plan Check bmet   Change positions slowly I will call with labs  Please contact office for sooner follow up if symptoms do not improve or worsen or seek emergency care  follow up Dr. Kriste Basque  In 2 months as planned

## 2011-09-06 NOTE — Progress Notes (Signed)
Subjective:    Patient ID: Margaret Mitchell, female    DOB: September 12, 1940, 71 y.o.   MRN: [id number]  HPI 71 y/o BF   she gets her meds from [facility]... she is the daughter of [name].  ~  November 07, 2009:  presents c/o sinus infection w/ pressure, HA, green drainage, sneezing, & cough... we discussed Rx w/ Depo, Augmentin, Saline, Mucinex... she also c/o "body cramps" evaluated by DrWainer & she says he put her on "mustard" but it is upsetting her stomach;  she has Soma350 & she is asked to incr this to Tid Prn;  she does describe some noct leg cramps- try tonic water Rx;  finally she reports DrWainer plans cortisone shots in neck (we don't have records)... she had BMD done 5/11 w/ normal TScores & now off Fosamax for drug holiday... she will get the 2011 Flu vaccine when she is over the current infection.  ~  March 08, 2010:  She was seen 11/11 by DrWert in my absence c/o cramps in her hands and feet> labs OK x Na=130 & BP was 130/80 so he switched Lotrel5-20 to Azor5-20, & rec incr Tonic water;  f/u TP 12/11 w/ persist cramps (no improvement w/ tonic water or Robaxin given by DrWainer) & rec to do stretching exercises;  then several phone calls> insurance refused Azor unless tried Losartan etc, so changed to Amlodip + Losartan but it made her feet feel "like they were in hot water";  after all this we decided to change back to the Lotrel 5-20 which she tolerated well & apparently had nothing to do w/ her cramps which she describes as a "lifetime prob" & does best w/ Alpraz Qhs...  ~  May 10, 2010:  ROV w/ fasting labs done recently & reviewed w/ pt> they look good, same meds, better diet & get weight down;  Her CC= neck pain w/ thorough eval by Ortho, DrWainer including XRays and 2 MRI's she says "I have a lot of arthritis & degenerating discs"> discussed rest, heat, Tylenol, etc (she does not want muscle relaxers or pain pills)...  BP doing well on the Toprol & Lotrel> denies CP,  palpit, dizzy, cerebral symptoms, SOB, edema, etc...  ~  September 04, 2010:  78mo ROV & post-ER check> she went to the ED 08/31/10 w/ dizziness, acute onset that day, unsteady on her feet, no focal neuro deficits on exam by DrZammit, LABS wnl, CTBrain w/ mild atrophy, sm vessel dis & NAD, given Valium & Antivert> states some better in the AM but gets worse in the afternoon;  It appears positional but not postural, yet BP today drops 20 points when standing (140 supine ==> 120 standing); We discussed continued Rx w/ Antivert, Valium, & try Epley maneuvers; we will adjust BP meds by decreasing the Metoprolol to 1/2 in AM & change the Lotrel to PM >> we will refer to Neuro for further suggestions...    We reviewed her ER Labs & Brain scan, plus her current med list (see below)...    She notes that her neck pain & arthritis are some improved w/ Tylenol & her Xanax...  ~  November 13, 2010:  8mo ROV & she saw DrWong, [facility] 10/12 for her dizziness & agreed that this was likely BPPV, plus he was concerned for a periph neuropathy on exam; he did MRI/MRA (small vessel dis- see report) and Periph neuropathy labs (essentially neg x incr sed & crp); he plans EMG/NCV-  pending...  She also saw DrGottsegen 9/12 for f/u of a perineal cyst & routine f/u> doing satis, no changes made & she remains on oral estrogen for her vasomotor symptoms... She reports BP ok at home & meds unchanged from prev- see prob list below & ok flu shot today...   ~  May 14, 2011:  65mo ROV & [name] is doing reasonably well but very anxious in general & wonders if there is a test for Parkinsons because sometimes she shakes, wants BS checked because of +FamHx DM, sched for left eye cast surg soon & still caring for her mother [name]) in [place];  As noted prev- she has seen DrWong for dizziness (+HAs/ RLS/ ?neuropathy) & he thought it was likely BPPV & Rx w/ Epley maneuvers;  She was seen by Gyn DrFontaine 2/13 (yeast treated w/  Diflucan) & his notes are reviewed in Epic;  Finally she saw TP 3/13 w/ Shingles right neck area & rash resolved on Rx... on the positive side she has lost some weight & labs are all wnl; she requests refill Alprazolam- ok...  We reviewed her problem list, medications, xrays & labs> see below>> LABS 4/13:  FLP- at goals on diet alone;  Chems- wnl;  CBC- wnl;  TSH=1.60  09/06/2011 Follow up B/P  Pt had called in couple of months ago with recurrent dizziness. Has had problems with dizziness over the years with extensive workup with neuro -CT and MRI of the brain. Felt to be related to vertigo.  She requested something for dizziness so her b/p meds were changed from lotrel to losartan.  States dizziness has eased up but still there.  HAs are unchanged.  Also, reports runny nose at different times with clear drainage x 1 wk.  She wants to go back on Lotrel as b/p is not as well controlled.  Dizziness is mild seems to be related to position changes. No visual/speech changes . No ext. Weakness.         Problem List:  Health Maintenance - she takes ASA 81mg /d... her GYN is DrGottsegen- on Premarin 0.625mg /d... [facility]... BMD here 5/11 w/ normal TScores & off Fosamax now for drug holiday. ~  Immunizations: she gets yearly Flu vaccines in the fall... had Pneumovax 2003 at age 60>  repeat PNEUMOVAX given 4/11 age 31.  ALLERGY (ICD-995.3) - Rx w/ Claritin, Saline, Flonase w/ prev ENT eval by DrShoemaker. ~  10/11:  seen w/ sinusitis- Rx Augmentin, Depo, Mucinex, Saline...  HYPERTENSION (ICD-401.9) - on ASA 81mg /d, TOPROL XL 100mg - 1/2 tab in AM, & LOTREL 5/20 Daily in PM...  ~  CXR 4/11 was clear, WNL...  ~  2/12:  BP= 136/88 and similiar at home per pt in the 130/80 range... denies visual changes, CP, palipit, dizziness, syncope, dyspnea, edema, etc... she exercises by walking... ~  4/12:  BP= 114/68 and continues to feel well overall... ~  8/12:  BP= 120/70 sitting/ standing (140 supine)  & we decided to decr the Metoprolol to 1/2 tab in AM & take the Lotrel in PM. ~  10/12:  BP= 120/74 w/o postural change & she denies CP, palpit, SOB, edema, etc... ~  CXR 10/12 showed normal heart size, clear lungs, NAD... ~  4/13:  BP= 128/78 & she denies HA, CP, palpit, SOB, edema, etc...  HYPERCHOLESTEROLEMIA, BORDERLINE (ICD-272.4) - on diet alone + Fish Oil... ~  FLP 4/10 (wt=203#) showed TChol 209, TG 116, HDL 67, LDL 105 ~  FLP 4/11 (wt=210#) showed TChol 175, TG 138, HDL 61, LDL 87 ~  FLP 4/12 (wt=211#) showed TChol 203, TG 169, HDL 65, LDL 105 ~  FLP 4/13 (wt=183#) showed TChol 189, TG 117, HDL 83, LDL 82... Great job w/ wt reduction!  GERD (ICD-530.81) - on NEXIUM 40mg Bid... ~  last EGD was 4/07 by DrPatterson showing chr GERD and gastric polyps...  ~  AbdSonar 2007 w/ no gallstones, negative sonar...  ~  HIDA Scan 5/09 was norm w/ eject fract 76% & patent ducts...  IRRITABLE BOWEL SYNDROME (ICD-564.1) -  ~  last colonoscopy 9/07 by DrPatterson showed melanosis only, no other abn...  CONSTIPATION (ICD-564.00) - on Metamucil etc...  DEGENERATIVE JOINT DISEASE (ICD-715.90) - she uses Arthritis Tylenol as needed... ~  10/11:  she reports followed by DrWainer w/ cortisone shots in neck planned- currently taking "mustard" for "body cramps" but it upset her stomach; asked to try tonic water for nocturnal leg cramps, and incr [id number] Tid Prn muscle spasm. ~  4/12:  She states she's had XRays & 2 MRIs from DrWainer w/ "alot of arthritis & degenerating discs"; given 2 shots- helped short term;  She stopped Soma & just uses Alpraz Prn...  OSTEOPOROSIS (ICD-733.00) - on Calcium and Vits w/ 1000 VitD... off prev fosamax w/ norm BMD... ~  BMD 4/09 much improved w/ TScores -0.2 to -0.7.Marland KitchenMarland Kitchen Fosamax decr to [employer]. ~  Vit D level 4/09 = 32... rec> Vit D 1000 u daily... ~  Vit D level 4/10 = 46... rec> continue same. ~  5/11:  BMD here showed TScores -0.7 in Spine, and -0.5 in left FemNeck...  rec> stop Fosamax for drug holiday.  HEADACHE (ICD-784.0) >> seen by DrWong w/ mult symptoms including HA, Dizzy, ?RLS, ?Neuropathy.>>  DIZZINESS >> Eval 10/12 by DrWong-Neurology w/ prob BPPV & he suggested vestib rehab... ~  MRI/ MRA showed sm vessel dis, otherw no acute changes, prob hypoplastic right vertebral art...  ?PERIPHERAL NEUROPATHY >> ?Etiology w/ eval from DrWong 10/12 including PN Labs- all essentially wnl x sed=35/ crp=2.37... ~  EMG/NCV done in [place] due to her [employer] insurance ==> evid for chr right L5-S1 radiculopathy ~  DrWong also thought she had RLS & gave her a Requip trial...  ANXIETY (ICD-300.00) - on ALPRAZOLAM 0.5mg  as needed... her husb had an MI, cares for elderly mother.  S/P Shingles >> she had an outbreak on the right side of her neck 3/13 & treated by TP w/ Valtrex and resolved...    Past Surgical History  Procedure Date  . Parathyroid adenoma removed   . Vesicovaginal fistula closure w/ tah   . Tonsillectomy   . Appendectomy   . Abdominal hysterectomy 1983    TAH,BSO  . Tubal ligation 1981  . Rotator cuff repair     X2  . Foot surgery     RIGHT AND LEFT    Outpatient Encounter Prescriptions as of 09/06/2011  Medication Sig Dispense Refill  . ALPRAZolam (XANAX) 0.5 MG tablet Take 1 tablet (0.5 mg total) by mouth 3 (three) times daily as needed.  90 tablet  5  . aspirin 81 MG tablet Take 81 mg by mouth daily.        . Calcium Carb-Cholecalciferol (CALCIUM 1000 + D PO) Take 1 capsule by mouth daily.        Marland Kitchen estrogens, conjugated, (PREMARIN) 0.625 MG tablet Take 0.625 mg by mouth daily.        Marland Kitchen losartan (COZAAR) 50 MG tablet  Take 1 tablet (50 mg total) by mouth daily.  30 tablet  0  . metoprolol succinate (TOPROL-XL) 100 MG 24 hr tablet Take 1/2 tablet by mouth daily  45 tablet  3  . NEXIUM 40 MG capsule TAKE 1 CAPSULE BY MOUTH TWICE  A DAY 30 MINUTES BEFORE MEALS  180 capsule  3  . Omega-3 Fatty Acids (FISH OIL) 1000 MG CAPS Take as directed         . amLODipine-benazepril (LOTREL) 5-20 MG per capsule Take 1 capsule by mouth daily.  90 capsule  3  . DISCONTD: Cholecalciferol (VITAMIN D) 1000 UNITS capsule Take 1,000 Units by mouth daily.        Marland Kitchen DISCONTD: NONFORMULARY OR COMPOUNDED ITEM Boric acid suppository 600 mg one per vagina 2 times weekly  30 each  1    Allergies  Allergen Reactions  . Amlodipine Besylate     REACTION: causes her feet to swell and burning  . Prednisone     REACTION: heart palpatations  In larger doses  . Rofecoxib     REACTION: mouth swelling (VIOXX)  . Sulfonamide Derivatives     REACTION: pt unable to remember  . Terfenadine     SELDANE    Current Medications, Allergies, Past Medical History, Past Surgical History, Family History, and Social History were reviewed in [employer] record.    Review of Systems        Constitutional:   No  weight loss, night sweats,  Fevers, chills, + fatigue, or  lassitude.  HEENT:   No headaches,  Difficulty swallowing,  Tooth/dental problems, or  Sore throat,                No sneezing, itching, ear ache, nasal congestion, post nasal drip,   CV:  No chest pain,  Orthopnea, PND, swelling in lower extremities, anasarca, dizziness, palpitations, syncope.   GI  No heartburn, indigestion, abdominal pain, nausea, vomiting, diarrhea, change in bowel habits, loss of appetite, bloody stools.   Resp:   No coughing up of blood.  No change in color of mucus.  No wheezing.  No chest wall deformity  Skin: no rash or lesions.  GU: no dysuria, change in color of urine, no urgency or frequency.  No flank pain, no hematuria   MS:  No joint pain or swelling.  No decreased range of motion.  No back pain.  Psych:  No change in mood or affect. No depression or anxiety.  No memory loss.       Objective:   Physical Exam      WD, WN, 70 y/o BF in NAD... GENERAL:  Alert & oriented; pleasant & cooperative... HEENT:  [place]/AT, EOM-wnl, PERRLA, no  nystagmus, EACs-clear, TMs-wnl, NOSE- pale with clear drainage, THROAT-clear & wnl. NECK:  Supple w/ fairROM; no JVD; normal carotid impulses w/o bruits; no thyromegaly or nodules palpated; no lymphadenopathy. CHEST:  Clear to P & A; without wheezes/ rales/ or rhonchi. HEART:  Regular Rhythm; without murmurs/ rubs/ or gallops. ABDOMEN:  Soft & nontender; normal bowel sounds; no organomegaly or masses detected. EXT: without deformities, mild arthritic changes; no varicose veins/ +venous insuffic/ no edema. NEURO:  CN's intact; motor testing normal; sensory testing  nml , no focal deficits noted.  Head manuevrs without reproducible symptoms  DERM:  No lesions noted; no rash etc..  RADIOLOGY DATA:  Reviewed in the EPIC EMR & discussed w/ the patient...  LABORATORY DATA:  Reviewed in the EPIC  EMR & discussed w/ the patient...   Assessment & Plan:

## 2011-09-06 NOTE — Progress Notes (Signed)
Quick Note:  Called, spoke with pt. I informed her labs are ok, cont with ov recs per TP. She verbalized understanding of this and voiced no further questions/concerns at this time. ______

## 2011-09-06 NOTE — Assessment & Plan Note (Signed)
B/P not as well controlled   Check bmet   Plan;  Stop Losartan .  Restart Lotrel daily in am  Continue on Metoprolol in evening .  Low salt diet  I will call with labs  Please contact office for sooner follow up if symptoms do not improve or worsen or seek emergency care  follow up Dr. Kriste Basque  In 2 months as planned

## 2011-09-06 NOTE — Patient Instructions (Addendum)
Stop Losartan .  Restart Lotrel daily in am  Continue on Metoprolol in evening .  Low salt diet  Change positions slowly I will call with labs  Please contact office for sooner follow up if symptoms do not improve or worsen or seek emergency care  follow up Dr. Kriste Basque  In 2 months as planned

## 2011-09-25 ENCOUNTER — Encounter: Payer: Self-pay | Admitting: Obstetrics and Gynecology

## 2011-09-25 ENCOUNTER — Ambulatory Visit (INDEPENDENT_AMBULATORY_CARE_PROVIDER_SITE_OTHER): Payer: Medicare Other | Admitting: Obstetrics and Gynecology

## 2011-09-25 VITALS — BP 124/82 | Ht 66.0 in | Wt 194.0 lb

## 2011-09-25 DIAGNOSIS — N949 Unspecified condition associated with female genital organs and menstrual cycle: Secondary | ICD-10-CM

## 2011-09-25 DIAGNOSIS — M810 Age-related osteoporosis without current pathological fracture: Secondary | ICD-10-CM | POA: Diagnosis not present

## 2011-09-25 DIAGNOSIS — N879 Dysplasia of cervix uteri, unspecified: Secondary | ICD-10-CM | POA: Insufficient documentation

## 2011-09-25 DIAGNOSIS — Z780 Asymptomatic menopausal state: Secondary | ICD-10-CM | POA: Diagnosis not present

## 2011-09-25 DIAGNOSIS — N9489 Other specified conditions associated with female genital organs and menstrual cycle: Secondary | ICD-10-CM

## 2011-09-25 DIAGNOSIS — B373 Candidiasis of vulva and vagina: Secondary | ICD-10-CM | POA: Diagnosis not present

## 2011-09-25 DIAGNOSIS — F419 Anxiety disorder, unspecified: Secondary | ICD-10-CM | POA: Insufficient documentation

## 2011-09-25 LAB — WET PREP FOR TRICH, YEAST, CLUE
Clue Cells Wet Prep HPF POC: NONE SEEN
Trich, Wet Prep: NONE SEEN

## 2011-09-25 MED ORDER — ESTROGENS CONJUGATED 0.625 MG PO TABS: 0.6250 mg | ORAL_TABLET | Freq: Every day | ORAL | Status: DC

## 2011-09-25 MED ORDER — TERCONAZOLE 0.8 % VA CREA: 1.0000 | TOPICAL_CREAM | Freq: Every day | VAGINAL | Status: AC

## 2011-09-25 NOTE — Patient Instructions (Signed)
Continue yearly mammograms 

## 2011-09-25 NOTE — Progress Notes (Signed)
Patient came back to see me today for further followup. She remains on Premarin for control menopausal symptoms. She does well on it without problems. She has had trouble with yeast vaginitis. She responds to medication but gets recurrences. The last time she had it she saw  Dr. Audie Box and he treated with boric acid for 2 weeks with good results. She has been checked for diabetes recently and her sugars were normal. She had a total abdominal hysterectomy, bilateral salpingo-oophorectomy in 1983 for menorrhagia, pelvic pain and pelvic adhesions. She had normal Pap smears before the surgery but the pathology report revealed slight dysplasia. She has had normal Pap smears since then. She is having no vaginal bleeding. She is having no pelvic pain. She does her bone densities through Dr. Jodelle Green  Office. She had osteoporosis and was on Fosamax for a long period of time and is currently on drug holiday. She is due for a mammogram. Her last Pap smear was 2011. She is having no vaginal bleeding. She's had no pelvic pain.  ROS: 12 system review done. Pertinent positives above.other positives include sinusitis, parathyroid cyst, hypertension, irritable bowel syndrome,and hiatal hernia.  HEENT: Within normal limits. Kennon Portela present. Neck: No masses. Supraclavicular lymph nodes: Not enlarged. Breasts: Examined in both sitting and lying position. Symmetrical without skin changes or masses. Abdomen: Soft no masses guarding or rebound. No hernias. Pelvic: External within normal limits. 1 cm cyst on perineum.  BUS within normal limits. Vaginal examination shows good estrogen effect, no cystocele enterocele or rectocele. Obvious yeast seen and wet prep confirmatory. Cervix and uterus absent. Adnexa within normal limits. Rectovaginal confirmatory. Extremities within normal limits.  Assessment: #1. Yeast vaginitis #2. Osteoporosis 3. Menopausal symptoms #4. Cyst on perineum unchanged #5. Previous history of  cervical dysplasia.  Plan: Mammogram. Bone density followup through Dr. Kriste Basque. Continue Premarin 0.625 mg daily. Terconazole 3 cream. Boric acid 600 mg capsules use 3 times a week for maintenance.The new Pap smear guidelines were discussed with the patient. No pap done.

## 2011-10-01 DIAGNOSIS — M67919 Unspecified disorder of synovium and tendon, unspecified shoulder: Secondary | ICD-10-CM | POA: Diagnosis not present

## 2011-10-01 DIAGNOSIS — M719 Bursopathy, unspecified: Secondary | ICD-10-CM | POA: Diagnosis not present

## 2011-10-01 DIAGNOSIS — M5137 Other intervertebral disc degeneration, lumbosacral region: Secondary | ICD-10-CM | POA: Diagnosis not present

## 2011-10-15 HISTORY — PX: SHOULDER INJECTION: SHX5048

## 2011-10-21 DIAGNOSIS — H5210 Myopia, unspecified eye: Secondary | ICD-10-CM | POA: Diagnosis not present

## 2011-10-21 DIAGNOSIS — H2510 Age-related nuclear cataract, unspecified eye: Secondary | ICD-10-CM | POA: Diagnosis not present

## 2011-10-23 DIAGNOSIS — Z1231 Encounter for screening mammogram for malignant neoplasm of breast: Secondary | ICD-10-CM | POA: Diagnosis not present

## 2011-10-24 ENCOUNTER — Telehealth: Payer: Self-pay | Admitting: Pulmonary Disease

## 2011-10-24 ENCOUNTER — Encounter: Payer: Self-pay | Admitting: Obstetrics and Gynecology

## 2011-10-24 NOTE — Telephone Encounter (Signed)
Per SN--try tonic water at bedtime and call in rx requip (generic is fine) 0.5 mg  #60  1 po qhs and slowly increase to 1 po bid.  thanks

## 2011-10-24 NOTE — Telephone Encounter (Signed)
Spoke with pt and notified of recs per SN. She verbalized understanding and states that she already has the requip from Dr. Modesto Charon, will try tonic water along with this to see if this helps.

## 2011-10-24 NOTE — Telephone Encounter (Signed)
I spoke with pt and she c/o leg/feet cramps at night and hand cramps during the day x 3 weeks now. She has tried using mustard, warm water, standing on cold floors, and taking OTC "leg cramp medicine". Nothing seems to be helping. Please advise SN thanks  Allergies  Allergen Reactions  . Prednisone     REACTION: heart palpatations  In larger doses  . Rofecoxib     REACTION: mouth swelling (VIOXX)  . Sulfonamide Derivatives     REACTION: pt unable to remember  . Terfenadine     SELDANE

## 2011-10-25 NOTE — Telephone Encounter (Signed)
Duplicate message. 

## 2011-10-28 DIAGNOSIS — R51 Headache: Secondary | ICD-10-CM | POA: Diagnosis not present

## 2011-11-01 ENCOUNTER — Encounter: Payer: Self-pay | Admitting: Pulmonary Disease

## 2011-11-11 ENCOUNTER — Encounter: Payer: Self-pay | Admitting: *Deleted

## 2011-11-12 ENCOUNTER — Ambulatory Visit (INDEPENDENT_AMBULATORY_CARE_PROVIDER_SITE_OTHER): Payer: Medicare Other | Admitting: Pulmonary Disease

## 2011-11-12 ENCOUNTER — Encounter: Payer: Self-pay | Admitting: Pulmonary Disease

## 2011-11-12 VITALS — BP 112/60 | HR 73 | Temp 98.3°F | Ht 66.5 in | Wt 194.4 lb

## 2011-11-12 DIAGNOSIS — R42 Dizziness and giddiness: Secondary | ICD-10-CM

## 2011-11-12 DIAGNOSIS — F411 Generalized anxiety disorder: Secondary | ICD-10-CM

## 2011-11-12 DIAGNOSIS — M810 Age-related osteoporosis without current pathological fracture: Secondary | ICD-10-CM

## 2011-11-12 DIAGNOSIS — Z23 Encounter for immunization: Secondary | ICD-10-CM

## 2011-11-12 DIAGNOSIS — R51 Headache: Secondary | ICD-10-CM

## 2011-11-12 DIAGNOSIS — I10 Essential (primary) hypertension: Secondary | ICD-10-CM

## 2011-11-12 DIAGNOSIS — E785 Hyperlipidemia, unspecified: Secondary | ICD-10-CM

## 2011-11-12 DIAGNOSIS — K589 Irritable bowel syndrome without diarrhea: Secondary | ICD-10-CM | POA: Diagnosis not present

## 2011-11-12 DIAGNOSIS — K219 Gastro-esophageal reflux disease without esophagitis: Secondary | ICD-10-CM | POA: Diagnosis not present

## 2011-11-12 DIAGNOSIS — K5900 Constipation, unspecified: Secondary | ICD-10-CM

## 2011-11-12 DIAGNOSIS — G2581 Restless legs syndrome: Secondary | ICD-10-CM | POA: Insufficient documentation

## 2011-11-12 DIAGNOSIS — M1990 Unspecified osteoarthritis, unspecified site: Secondary | ICD-10-CM

## 2011-11-12 NOTE — Patient Instructions (Addendum)
Today we updated your med list in our EPIC system...    Continue your current medications the same...  We will set up a referral to the Headache management clinic for treatment of your chronic daily headaches...  We gave you the 2013 flu vaccine today...    And we wrote a prescription for the Shingles shot as well...  Call for any questions...  Let's plan a follow up visit in 6 months w/ FASTING blood work at that time.Marland KitchenMarland Kitchen

## 2011-11-12 NOTE — Progress Notes (Signed)
Subjective:    Patient ID: Margaret Mitchell, female    DOB: 1940/06/08, 71 y.o.   MRN: [id number]  HPI 71 y/o BF here for a follow up visit... she has mult med problems as noted below... she gets her meds from [facility]... she is the daughter of [name].  ~  November 07, 2009:  presents c/o sinus infection w/ pressure, HA, green drainage, sneezing, & cough... we discussed Rx w/ Depo, Augmentin, Saline, Mucinex... she also c/o "body cramps" evaluated by DrWainer & she says he put her on "mustard" but it is upsetting her stomach;  she has Soma350 & she is asked to incr this to Tid Prn;  she does describe some noct leg cramps- try tonic water Rx;  finally she reports DrWainer plans cortisone shots in neck (we don't have records)... she had BMD done 5/11 w/ normal TScores & now off Fosamax for drug holiday... she will get the 2011 Flu vaccine when she is over the current infection.  ~  March 08, 2010:  She was seen 11/11 by DrWert in my absence c/o cramps in her hands and feet> labs OK x Na=130 & BP was 130/80 so he switched Lotrel5-20 to Azor5-20, & rec incr Tonic water;  f/u TP 12/11 w/ persist cramps (no improvement w/ tonic water or Robaxin given by DrWainer) & rec to do stretching exercises;  then several phone calls> insurance refused Azor unless tried Losartan etc, so changed to Amlodip + Losartan but it made her feet feel "like they were in hot water";  after all this we decided to change back to the Lotrel 5-20 which she tolerated well & apparently had nothing to do w/ her cramps which she describes as a "lifetime prob" & does best w/ Alpraz Qhs...  ~  May 10, 2010:  ROV w/ fasting labs done recently & reviewed w/ pt> they look good, same meds, better diet & get weight down;  Her CC= neck pain w/ thorough eval by Ortho, DrWainer including XRays and 2 MRI's she says "I have a lot of arthritis & degenerating discs"> discussed rest, heat, Tylenol, etc (she does not want muscle  relaxers or pain pills)...  BP doing well on the Toprol & Lotrel> denies CP, palpit, dizzy, cerebral symptoms, SOB, edema, etc...  ~  September 04, 2010:  31mo ROV & post-ER check> she went to the ED 08/31/10 w/ dizziness, acute onset that day, unsteady on her feet, no focal neuro deficits on exam by DrZammit, LABS wnl, CTBrain w/ mild atrophy, sm vessel dis & NAD, given Valium & Antivert> states some better in the AM but gets worse in the afternoon;  It appears positional but not postural, yet BP today drops 20 points when standing (140 supine ==> 120 standing); We discussed continued Rx w/ Antivert, Valium, & try Epley maneuvers; we will adjust BP meds by decreasing the Metoprolol to 1/2 in AM & change the Lotrel to PM >> we will refer to Neuro for further suggestions...    We reviewed her ER Labs & Brain scan, plus her current med list (see below)...    She notes that her neck pain & arthritis are some improved w/ Tylenol & her Xanax...  ~  November 13, 2010:  38mo ROV & she saw DrWong, [facility] 10/12 for her dizziness & agreed that this was likely BPPV, plus he was concerned for a periph neuropathy on exam; he did MRI/MRA (small vessel dis- see report) and Periph  neuropathy labs (essentially neg x incr sed & crp); he plans EMG/NCV- pending...  She also saw DrGottsegen 9/12 for f/u of a perineal cyst & routine f/u> doing satis, no changes made & she remains on oral estrogen for her vasomotor symptoms... She reports BP ok at home & meds unchanged from prev- see prob list below & ok flu shot today...   ~  May 14, 2011:  73mo ROV & [name] is doing reasonably well but very anxious in general & wonders if there is a test for Parkinsons because sometimes she shakes, wants BS checked because of +FamHx DM, sched for left eye cast surg soon & still caring for her mother [name]) in [place];  As noted prev- she has seen DrWong for dizziness (+HAs/ RLS/ ?neuropathy) & he thought it was likely BPPV & Rx w/  Epley maneuvers;  She was seen by Gyn DrFontaine 2/13 (yeast treated w/ Diflucan) & his notes are reviewed in Epic;  Finally she saw TP 3/13 w/ Shingles right neck area & rash resolved on Rx... on the positive side she has lost some weight & labs are all wnl; she requests refill Alprazolam- ok...  We reviewed her problem list, medications, xrays & labs> see below>> LABS 4/13:  FLP- at goals on diet alone;  Chems- wnl;  CBC- wnl;  TSH=1.60  ~  November 12, 2011:  73mo ROV & Pat's CC is persistent HAs> she saw TP 8/13 w/ dizziness felt to be BPPV & treated w/ Meclizine, she had prev eval by Neuro w/ MRI etc;  We discussed need to refer to HA management clinic...    Hx HBP controlled on Lotrel w/ BP= 112/60;  Chol looks good fish oil but wt is up 11# to 194# & we reviewed diet, exercise & wt reduction strategies;  She developed worsening right shoulder rotator cuff tendonitis per DrWainer & shot helped...    We reviewed prob list, meds, xrays and labs> see below for updates >> OK Flu vaccine today & Rx written for shingles shot         Problem List:  Health Maintenance - she takes ASA 81mg /d... her GYN is DrGottsegen- on Premarin 0.625mg /d... Mammograms at [facility] yearly... BMD here 5/11 w/ normal TScores & off Fosamax now for drug holiday. ~  Immunizations: she gets yearly Flu vaccines in the fall... had Pneumovax 2003 at age 55>  repeat PNEUMOVAX given 4/11 age 53.  ALLERGY (ICD-995.3) - Rx w/ Claritin, Saline, Flonase w/ prev ENT eval by DrShoemaker. ~  10/11:  seen w/ sinusitis- Rx Augmentin, Depo, Mucinex, Saline...  HYPERTENSION (ICD-401.9) - on ASA 81mg /d, TOPROL XL 100mg - 1/2 tab in AM, & LOTREL 5/20 Daily in PM...  ~  CXR 4/11 was clear, WNL...  ~  2/12:  BP= 136/88 and similiar at home per pt in the 130/80 range... denies visual changes, CP, palipit, dizziness, syncope, dyspnea, edema, etc... she exercises by walking... ~  4/12:  BP= 114/68 and continues to feel well overall... ~  8/12:  BP=  120/70 sitting/ standing (140 supine) & we decided to decr the Metoprolol to 1/2 tab in AM & take the Lotrel in PM. ~  10/12:  BP= 120/74 w/o postural change & she denies CP, palpit, SOB, edema, etc... ~  CXR 10/12 showed normal heart size, clear lungs, NAD... ~  4/13:  BP= 128/78 & she denies HA, CP, palpit, SOB, edema, etc...  HYPERCHOLESTEROLEMIA, BORDERLINE (ICD-272.4) - on diet alone + Fish Oil... ~  FLP 4/10 (wt=203#) showed TChol 209, TG 116, HDL 67, LDL 105 ~  FLP 4/11 (wt=210#) showed TChol 175, TG 138, HDL 61, LDL 87 ~  FLP 4/12 (wt=211#) showed TChol 203, TG 169, HDL 65, LDL 105 ~  FLP 4/13 (wt=183#) showed TChol 189, TG 117, HDL 83, LDL 82... Great job w/ wt reduction!  GERD (ICD-530.81) - on NEXIUM 40mg Bid... ~  last EGD was 4/07 by DrPatterson showing chr GERD and gastric polyps...  ~  AbdSonar 2007 w/ no gallstones, negative sonar...  ~  HIDA Scan 5/09 was norm w/ eject fract 76% & patent ducts...  IRRITABLE BOWEL SYNDROME (ICD-564.1) -  ~  last colonoscopy 9/07 by DrPatterson showed melanosis only, no other abn...  CONSTIPATION (ICD-564.00) - on Metamucil etc...  DEGENERATIVE JOINT DISEASE (ICD-715.90) - she uses Arthritis Tylenol as needed... ~  10/11:  she reports followed by DrWainer w/ cortisone shots in neck planned- currently taking "mustard" for "body cramps" but it upset her stomach; asked to try tonic water for nocturnal leg cramps, and incr [id number] Tid Prn muscle spasm. ~  4/12:  She states she's had XRays & 2 MRIs from DrWainer w/ "alot of arthritis & degenerating discs"; given 2 shots- helped short term;  She stopped Soma & just uses Alpraz Prn... ~  9/13: c/o 49mo right shoulder pain& Dx w/ tendonitis per DrWainer- shot given...  OSTEOPOROSIS (ICD-733.00) - on Calcium and Vits w/ 1000 VitD... off prev fosamax w/ norm BMD... ~  BMD 4/09 much improved w/ TScores -0.2 to -0.7.Marland KitchenMarland Kitchen Fosamax decr to [employer]. ~  Vit D level 4/09 = 32... rec> Vit D 1000 u daily... ~   Vit D level 4/10 = 46... rec> continue same. ~  5/11:  BMD here showed TScores -0.7 in Spine, and -0.5 in left FemNeck... rec> stop Fosamax for drug holiday.  HEADACHE (ICD-784.0) >> seen by DrWong w/ mult symptoms including HA, Dizzy, ?RLS, ?Neuropathy.>>  DIZZINESS >> Eval 10/12 by DrWong-Neurology w/ prob BPPV & he suggested vestib rehab... ~  MRI/ MRA showed sm vessel dis, otherw no acute changes, prob hypoplastic right vertebral art...  ?PERIPHERAL NEUROPATHY >> ?Etiology w/ eval from DrWong 10/12 including PN Labs- all essentially wnl x sed=35/ crp=2.37... ~  EMG/NCV done in [place] due to her [employer] insurance ==> evid for chr right L5-S1 radiculopathy ~  DrWong also thought she had RLS & gave her a Requip trial=> & improved...  ANXIETY (ICD-300.00) - on ALPRAZOLAM 0.5mg  as needed... her husb had an MI, cares for elderly mother.  S/P Shingles >> she had an outbreak on the right side of her neck 3/13 & treated by TP w/ Valtrex and resolved...    Past Surgical History  Procedure Date  . Parathyroid adenoma removed   . Vesicovaginal fistula closure w/ tah   . Tonsillectomy   . Appendectomy   . Abdominal hysterectomy 1983    TAH,BSO  . Tubal ligation 1981  . Rotator cuff repair     X2  . Foot surgery     RIGHT AND LEFT  . Colposcopy   . Oophorectomy     BSO  . Cataract extraction   . Shoulder injection 10/2011    Outpatient Encounter Prescriptions as of 11/12/2011  Medication Sig Dispense Refill  . ALPRAZolam (XANAX) 0.5 MG tablet Take 1 tablet (0.5 mg total) by mouth 3 (three) times daily as needed.  90 tablet  5  . amLODipine-benazepril (LOTREL) 5-20 MG per capsule Take 1 capsule by mouth  daily.  90 capsule  3  . aspirin 81 MG tablet Take 81 mg by mouth daily.        . Calcium Carb-Cholecalciferol (CALCIUM 1000 + D PO) Take 1 capsule by mouth daily.        Marland Kitchen estrogens, conjugated, (PREMARIN) 0.625 MG tablet Take 1 tablet (0.625 mg total) by mouth daily.  90 tablet  3  .  metoprolol succinate (TOPROL-XL) 100 MG 24 hr tablet Take 1/2 tablet by mouth daily  45 tablet  3  . NEXIUM 40 MG capsule TAKE 1 CAPSULE BY MOUTH TWICE  A DAY 30 MINUTES BEFORE MEALS  180 capsule  3  . Omega-3 Fatty Acids (FISH OIL) 1000 MG CAPS Take as directed       . rOPINIRole (REQUIP) 0.5 MG tablet 1 at bedtime        Allergies  Allergen Reactions  . Prednisone     REACTION: heart palpatations  In larger doses  . Rofecoxib     REACTION: mouth swelling (VIOXX)  . Sulfonamide Derivatives     REACTION: pt unable to remember  . Terfenadine     SELDANE    Current Medications, Allergies, Past Medical History, Past Surgical History, Family History, and Social History were reviewed in [employer] record.    Review of Systems        See HPI - all other systems neg except as noted... The patient complains of dyspnea on exertion.  The patient denies anorexia, fever, weight loss, weight gain, vision loss, decreased hearing, hoarseness, chest pain, syncope, peripheral edema, prolonged cough, headaches, hemoptysis, abdominal pain, melena, hematochezia, severe indigestion/heartburn, hematuria, incontinence, muscle weakness, suspicious skin lesions, transient blindness, difficulty walking, depression, unusual weight change, abnormal bleeding, enlarged lymph nodes, and angioedema.     Objective:   Physical Exam      WD, WN, 71 y/o BF in NAD... GENERAL:  Alert & oriented; pleasant & cooperative... HEENT:  [place]/AT, EOM-wnl, PERRLA, no nystagmus, EACs-clear, TMs-wnl, NOSE- sl red w/ drainage, THROAT-clear & wnl. NECK:  Supple w/ fairROM; no JVD; normal carotid impulses w/o bruits; no thyromegaly or nodules palpated; no lymphadenopathy. CHEST:  Clear to P & A; without wheezes/ rales/ or rhonchi. HEART:  Regular Rhythm; without murmurs/ rubs/ or gallops. ABDOMEN:  Soft & nontender; normal bowel sounds; no organomegaly or masses detected. EXT: without deformities, mild  arthritic changes; no varicose veins/ +venous insuffic/ no edema. NEURO:  CN's intact; motor testing normal; sensory testing ?diminished peripherally; ?balance off but no specific gait abn noted. Decreased ankle jerk reflexes... DERM:  No lesions noted; no rash etc..  RADIOLOGY DATA:  Reviewed in the EPIC EMR & discussed w/ the patient...  LABORATORY DATA:  Reviewed in the EPIC EMR & discussed w/ the patient...   Assessment & Plan:    Headaches>  We discussed further eval in HA clinic...  Dizziness/ HA>  ?BPPV & we reviewed Epley maneuver w/ her; she is already on ASA daily w/ her known sm vessel dis; she is reassured by the Neurology eval from DrWong...  ?Peripheral Neuropathy/ ?RLS>  She is getting work up from [employer] given trial of Requip...   HBP>  Controlled on BBlocker, ACE/CCB; continue the Metoprolol at 1/2 of the 100mg  tab in AM, & the Lotrel PM dosing; she will monitor her BP.  CHOL>  FLP is close to goal values on the diet & exercise program, no meds other than Fish Oil; continue same...  GERD>  Controlled  on Nexium Bid...  DJD>  Followed by DrWainer, eval as above & she uses arthritis tylenol alone (doesn't want stronger pain meds)...  Osteopenia>  Last BMD 5/11 was WNL & she is on a bisphos drug holiday...  Anxiety>  She is under a mod amt of stress & uses Alpraz prn...   Patient's Medications  New Prescriptions   AMOXICILLIN-CLAVULANATE (AUGMENTIN) 875-125 MG PER TABLET    Take 1 tablet by mouth 2 (two) times daily.  Previous Medications   ALPRAZOLAM (XANAX) 0.5 MG TABLET    Take 1 tablet (0.5 mg total) by mouth 3 (three) times daily as needed.   AMLODIPINE-BENAZEPRIL (LOTREL) 5-20 MG PER CAPSULE    Take 1 capsule by mouth daily.   ASPIRIN 81 MG TABLET    Take 81 mg by mouth daily.     CALCIUM CARB-CHOLECALCIFEROL (CALCIUM 1000 + D PO)    Take 1 capsule by mouth daily.     ESTROGENS, CONJUGATED, (PREMARIN) 0.625 MG TABLET    Take 1 tablet (0.625 mg total) by  mouth daily.   METOPROLOL SUCCINATE (TOPROL-XL) 100 MG 24 HR TABLET    Take 1/2 tablet by mouth daily   NEXIUM 40 MG CAPSULE    TAKE 1 CAPSULE BY MOUTH TWICE  A DAY 30 MINUTES BEFORE MEALS   OMEGA-3 FATTY ACIDS (FISH OIL) 1000 MG CAPS    Take as directed    ROPINIROLE (REQUIP) 0.5 MG TABLET    1 at bedtime  Modified Medications   No medications on file  Discontinued Medications   No medications on file

## 2011-11-20 ENCOUNTER — Telehealth: Payer: Self-pay | Admitting: Pulmonary Disease

## 2011-11-20 MED ORDER — AMOXICILLIN-POT CLAVULANATE 875-125 MG PO TABS: 1.0000 | ORAL_TABLET | Freq: Two times a day (BID) | ORAL | Status: DC

## 2011-11-20 NOTE — Telephone Encounter (Signed)
Last ov w/ SN 10.29.13  Called spoke with patient who c/o head congestion w/ yellow PND, sore/raw throat, dry cough and pressure behind the eyes x3 days.  Denies SOB, wheezing, f/c/s.  Has been taking mucinex d otc.    Dr Kriste Basque please advise, thanks. [facility] Allergies  Allergen Reactions  . Prednisone     REACTION: heart palpatations  In larger doses  . Rofecoxib     REACTION: mouth swelling (VIOXX)  . Sulfonamide Derivatives     REACTION: pt unable to remember  . Terfenadine     SELDANE

## 2011-11-20 NOTE — Telephone Encounter (Signed)
Per SN---call in augmentin 875 mg  #14  1 po bid, mucinex 2 po bid with plenty of  Fluids, nasal saline spray every 1-2 hours while awake.  thanks

## 2011-11-20 NOTE — Telephone Encounter (Signed)
Called spoke with patient, advised of SN's recs as stated below.  Pt okay with these recs and verbalized her understanding.  Rx sent to verified pharmacy.

## 2011-11-21 DIAGNOSIS — Z049 Encounter for examination and observation for unspecified reason: Secondary | ICD-10-CM | POA: Diagnosis not present

## 2011-11-21 DIAGNOSIS — R51 Headache: Secondary | ICD-10-CM | POA: Diagnosis not present

## 2011-11-21 DIAGNOSIS — Z79899 Other long term (current) drug therapy: Secondary | ICD-10-CM | POA: Diagnosis not present

## 2011-11-21 DIAGNOSIS — G518 Other disorders of facial nerve: Secondary | ICD-10-CM | POA: Diagnosis not present

## 2011-11-21 DIAGNOSIS — G43719 Chronic migraine without aura, intractable, without status migrainosus: Secondary | ICD-10-CM | POA: Diagnosis not present

## 2011-11-21 DIAGNOSIS — M542 Cervicalgia: Secondary | ICD-10-CM | POA: Diagnosis not present

## 2011-11-25 DIAGNOSIS — M542 Cervicalgia: Secondary | ICD-10-CM | POA: Diagnosis not present

## 2011-11-25 DIAGNOSIS — R51 Headache: Secondary | ICD-10-CM | POA: Diagnosis not present

## 2011-11-25 DIAGNOSIS — IMO0001 Reserved for inherently not codable concepts without codable children: Secondary | ICD-10-CM | POA: Diagnosis not present

## 2011-11-25 DIAGNOSIS — G518 Other disorders of facial nerve: Secondary | ICD-10-CM | POA: Diagnosis not present

## 2011-11-28 DIAGNOSIS — L600 Ingrowing nail: Secondary | ICD-10-CM | POA: Diagnosis not present

## 2011-12-02 DIAGNOSIS — G5600 Carpal tunnel syndrome, unspecified upper limb: Secondary | ICD-10-CM | POA: Diagnosis not present

## 2011-12-09 DIAGNOSIS — M542 Cervicalgia: Secondary | ICD-10-CM | POA: Diagnosis not present

## 2011-12-09 DIAGNOSIS — G518 Other disorders of facial nerve: Secondary | ICD-10-CM | POA: Diagnosis not present

## 2011-12-09 DIAGNOSIS — R51 Headache: Secondary | ICD-10-CM | POA: Diagnosis not present

## 2011-12-09 DIAGNOSIS — IMO0001 Reserved for inherently not codable concepts without codable children: Secondary | ICD-10-CM | POA: Diagnosis not present

## 2011-12-16 DIAGNOSIS — G609 Hereditary and idiopathic neuropathy, unspecified: Secondary | ICD-10-CM | POA: Diagnosis not present

## 2011-12-18 ENCOUNTER — Telehealth: Payer: Self-pay | Admitting: Pulmonary Disease

## 2011-12-18 NOTE — Telephone Encounter (Signed)
Spoke with pt. She states that she has seen at the [facility] for HA's and had nerve conduction study done which showed neuropathy in her legs. She states that Dr. Neale Burly advised given her age, there is no tx for this. She wants to know if SN agrees, and is curious if she were younger, would there have been tx available. Please advise, thanks!

## 2011-12-18 NOTE — Telephone Encounter (Signed)
Per SN----he will have to defer her to the neurologist for this, since he is the expert.  However on occasions they treat neuropathy pain with neurontin or lyrica.   She will need an OV with SN to discuss.  thanks

## 2011-12-18 NOTE — Telephone Encounter (Signed)
LMTCB

## 2011-12-18 NOTE — Telephone Encounter (Signed)
Returning call can be reached at [phone].Margaret Mitchell

## 2011-12-18 NOTE — Telephone Encounter (Signed)
Please advise where pt can be worked in? thanks 

## 2011-12-19 NOTE — Telephone Encounter (Signed)
Pt is aware and has been scheduled for 12/30/11 @9  am w/ SN. Pt says she will wait on Neurology referral after she sees SN.

## 2011-12-19 NOTE — Telephone Encounter (Signed)
Can work pt in on 12-16 at 73.  thanks

## 2011-12-23 DIAGNOSIS — IMO0001 Reserved for inherently not codable concepts without codable children: Secondary | ICD-10-CM | POA: Diagnosis not present

## 2011-12-23 DIAGNOSIS — R51 Headache: Secondary | ICD-10-CM | POA: Diagnosis not present

## 2011-12-23 DIAGNOSIS — M542 Cervicalgia: Secondary | ICD-10-CM | POA: Diagnosis not present

## 2011-12-23 DIAGNOSIS — G518 Other disorders of facial nerve: Secondary | ICD-10-CM | POA: Diagnosis not present

## 2011-12-30 ENCOUNTER — Encounter: Payer: Self-pay | Admitting: Pulmonary Disease

## 2011-12-30 ENCOUNTER — Encounter: Payer: Self-pay | Admitting: *Deleted

## 2011-12-30 ENCOUNTER — Ambulatory Visit (INDEPENDENT_AMBULATORY_CARE_PROVIDER_SITE_OTHER): Payer: Medicare Other | Admitting: Pulmonary Disease

## 2011-12-30 VITALS — BP 110/72 | HR 60 | Temp 97.0°F | Ht 66.5 in | Wt 196.6 lb

## 2011-12-30 DIAGNOSIS — I10 Essential (primary) hypertension: Secondary | ICD-10-CM | POA: Diagnosis not present

## 2011-12-30 DIAGNOSIS — R51 Headache: Secondary | ICD-10-CM

## 2011-12-30 DIAGNOSIS — K219 Gastro-esophageal reflux disease without esophagitis: Secondary | ICD-10-CM

## 2011-12-30 DIAGNOSIS — G629 Polyneuropathy, unspecified: Secondary | ICD-10-CM | POA: Insufficient documentation

## 2011-12-30 DIAGNOSIS — G589 Mononeuropathy, unspecified: Secondary | ICD-10-CM | POA: Diagnosis not present

## 2011-12-30 DIAGNOSIS — F419 Anxiety disorder, unspecified: Secondary | ICD-10-CM

## 2011-12-30 DIAGNOSIS — G2581 Restless legs syndrome: Secondary | ICD-10-CM

## 2011-12-30 DIAGNOSIS — F411 Generalized anxiety disorder: Secondary | ICD-10-CM

## 2011-12-30 DIAGNOSIS — M1990 Unspecified osteoarthritis, unspecified site: Secondary | ICD-10-CM | POA: Diagnosis not present

## 2011-12-30 NOTE — Patient Instructions (Addendum)
Today we updated your med list in our EPIC system...    Continue your current medications the same...  Continue your meds & treatments from DrFreeman...  Call for any questions...  Let's plan a follow up visit in the spring w/ FASTING blood work at that time.Marland KitchenMarland Kitchen

## 2011-12-30 NOTE — Progress Notes (Signed)
Subjective:    Patient ID: Margaret Mitchell, female    DOB: 07/14/1940, 71 y.o.   MRN: [id number]  HPI 71 y/o BF here for a follow up visit... she has mult med problems as noted below... she gets her meds from [facility]... she is the daughter of Margaret Mitchell.  ~  March 08, 2010:  She was seen 11/11 by Margaret Mitchell in my absence c/o cramps in her hands and feet> labs OK x Na=130 & BP was 130/80 so he switched Lotrel5-20 to Azor5-20, & rec incr Tonic water;  f/u TP 12/11 w/ persist cramps (no improvement w/ tonic water or Robaxin given by Margaret Mitchell) & rec to do stretching exercises;  then several phone calls> insurance refused Azor unless tried Losartan etc, so changed to Amlodip + Losartan but it made her feet feel "like they were in hot water";  after all this we decided to change back to the Lotrel 5-20 which she tolerated well & apparently had nothing to do w/ her cramps which she describes as a "lifetime prob" & does best w/ Alpraz Qhs...  ~  May 10, 2010:  ROV w/ fasting labs done recently & reviewed w/ pt> they look good, same meds, better diet & get weight down;  Her CC= neck pain w/ thorough eval by Ortho, Margaret Mitchell including XRays and 2 MRI's she says "I have a lot of arthritis & degenerating discs"> discussed rest, heat, Tylenol, etc (she does not want muscle relaxers or pain pills)...  BP doing well on the Toprol & Lotrel> denies CP, palpit, dizzy, cerebral symptoms, SOB, edema, etc...  ~  September 04, 2010:  41mo ROV & post-ER check> she went to the ED 08/31/10 w/ dizziness, acute onset that day, unsteady on her feet, no focal neuro deficits on exam by Margaret Mitchell, LABS wnl, CTBrain w/ mild atrophy, sm vessel dis & NAD, given Valium & Antivert> states some better in the AM but gets worse in the afternoon;  It appears positional but not postural, yet BP today drops 20 points when standing (140 supine ==> 120 standing); We discussed continued Rx w/ Antivert, Valium, & try Epley maneuvers; we will  adjust BP meds by decreasing the Metoprolol to 1/2 in AM & change the Lotrel to PM >> we will refer to Neuro for further suggestions...    We reviewed her ER Labs & Brain scan, plus her current med list (see below)...    She notes that her neck pain & arthritis are some improved w/ Tylenol & her Xanax...  ~  November 13, 2010:  27mo ROV & she saw Margaret Mitchell, Margaret Mitchell 10/12 for her dizziness & agreed that this was likely BPPV, plus he was concerned for a periph neuropathy on exam; he did MRI/MRA (small vessel dis- see report) and Periph neuropathy labs (essentially neg x incr sed & crp); he plans EMG/NCV- pending...  She also saw Margaret Mitchell 9/12 for f/u of a perineal cyst & routine f/u> doing satis, no changes made & she remains on oral estrogen for her vasomotor symptoms... She reports BP ok at home & meds unchanged from prev- see prob list below & ok flu shot today...   ~  May 14, 2011:  30mo ROV & Margaret Mitchell is doing reasonably well but very anxious in general & wonders if there is a test for Parkinsons because sometimes she shakes, wants BS checked because of +FamHx DM, sched for left eye cast surg soon & still caring for her mother Margaret Grim  Mitchell) in [facility];  As noted prev- she has seen Margaret Mitchell for dizziness (+HAs/ RLS/ ?neuropathy) & he thought it was likely BPPV & Rx w/ Epley maneuvers;  She was seen by Gyn Margaret Mitchell 2/13 (yeast treated w/ Diflucan) & his notes are reviewed in Epic;  Finally she saw TP 3/13 w/ Shingles right neck area & rash resolved on Rx... on the positive side she has lost some weight & labs are all wnl; she requests refill Alprazolam- ok...  We reviewed her problem list, medications, xrays & labs> see below>> LABS 4/13:  FLP- at goals on diet alone;  Chems- wnl;  CBC- wnl;  TSH=1.60  ~  November 12, 2011:  14mo ROV & [name]'s CC is persistent HAs> she saw TP 8/13 w/ dizziness felt to be BPPV & treated w/ Meclizine, she had prev eval by Neuro w/ MRI etc;  We discussed need to refer  to HA management clinic...    Hx HBP controlled on Lotrel w/ BP= 112/60;  Chol looks good fish oil but wt is up 11# to 194# & we reviewed diet, exercise & wt reduction strategies;  She developed worsening right shoulder rotator cuff tendonitis per Margaret Mitchell & shot helped...    We reviewed prob list, meds, xrays and labs> see below for updates >> OK Flu vaccine today & Rx written for shingles shot  ~  December 30, 2011:  6wk ROV & Margaret Mitchell returns for an add-on visit to discuss questions she has over her eval by Margaret Mitchell; she saw him 11/21/11 for eval of HAs & his note is reviewed> hx diagnosed chr migrains, supraorbital neuralgia, occipital neuralgia, cervicalgia, myalgia, spasm of muscles, & prob medication overuse headaches;  He ordered EMGs (we don't have this report) & told her she had neuropathy (apparently in her right leg) & prescribed GABAPENTIN 300mg  Qhs & extra 100mg  during the day prn;  She is receiving nerve block shots in her neck & reports that these are helping;  She was informed that some people w/ neuropathy have DM & Thyroid dis- I reassured her that she does NOT have either of these conditions & that Margaret Mitchell would pursue the etiology w/ MRI of spine if & when he felt it was indicated...    HBP> on MetopER50, Lotrel5-20; BP= 110/72 & she denies recent HA, CP, palpit, SOB, edema, etc...    Chol> on FishOil & diet; last FLP 4/13 showed TChol 189, TG 117, HDL 83, LDL 82    GI- GERD, IBS, Constip> on Nexium40prn; she denies abd pain, n/v, c/d, blood seen...    DJD> on OTC analgesics as needed; followed by Margaret Mitchell (see below)...    Osteopenia> on calcium, MVI, VitD; last BMD improved & she's been on a bisphos drug holiday...    HAs> see above for recent eval by Margaret Mitchell- on Gabapentin300Qhs & occipital neuralgia injections...    Neuropathy> she had prev neuropathy work up by Margaret Mitchell w/ evid of chronic right L5-S1 radiculopathy...    Anxiety> on Alprazolam that also helps her sleep... We  reviewed prob list, meds, xrays and labs> see below for updates >>          Problem List:  Health Maintenance - she takes ASA 81mg /d... her GYN is Margaret Mitchell- on Premarin 0.625mg /d... Mammograms at [facility] yearly... BMD here 5/11 w/ normal TScores & off Fosamax now for drug holiday. ~  Immunizations: she gets yearly Flu vaccines in the fall... had Pneumovax 2003 at age 58>  repeat PNEUMOVAX given 4/11 age  68.  ALLERGY (ICD-995.3) - Rx w/ Claritin, Saline, Flonase w/ prev ENT eval by DrShoemaker. ~  10/11:  seen w/ sinusitis- Rx Augmentin, Depo, Mucinex, Saline...  HYPERTENSION (ICD-401.9) - on ASA 81mg /d, TOPROL XL 100mg - 1/2 tab in AM, & LOTREL 5/20 Daily in PM...  ~  CXR 4/11 was clear, WNL...  ~  2/12:  BP= 136/88 and similiar at home per pt in the 130/80 range... denies visual changes, CP, palipit, dizziness, syncope, dyspnea, edema, etc... she exercises by walking... ~  4/12:  BP= 114/68 and continues to feel well overall... ~  8/12:  BP= 120/70 sitting/ standing (140 supine) & we decided to decr the Metoprolol to 1/2 tab in AM & take the Lotrel in PM. ~  10/12:  BP= 120/74 w/o postural change & she denies CP, palpit, SOB, edema, etc... ~  CXR 10/12 showed normal heart size, clear lungs, NAD... ~  4/13:  BP= 128/78 & she denies HA, CP, palpit, SOB, edema, etc...  HYPERCHOLESTEROLEMIA, BORDERLINE (ICD-272.4) - on diet alone + Fish Oil... ~  FLP 4/10 (wt=203#) showed TChol 209, TG 116, HDL 67, LDL 105 ~  FLP 4/11 (wt=210#) showed TChol 175, TG 138, HDL 61, LDL 87 ~  FLP 4/12 (wt=211#) showed TChol 203, TG 169, HDL 65, LDL 105 ~  FLP 4/13 (wt=183#) showed TChol 189, TG 117, HDL 83, LDL 82... Great job w/ wt reduction!  GERD (ICD-530.81) - on NEXIUM 40mg Bid... ~  last EGD was 4/07 by DrPatterson showing chr GERD and gastric polyps...  ~  AbdSonar 2007 w/ no gallstones, negative sonar...  ~  HIDA Scan 5/09 was norm w/ eject fract 76% & patent ducts...  IRRITABLE BOWEL SYNDROME  (ICD-564.1) -  ~  last colonoscopy 9/07 by DrPatterson showed melanosis only, no other abn...  CONSTIPATION (ICD-564.00) - on Metamucil etc...  DEGENERATIVE JOINT DISEASE (ICD-715.90) - she uses Arthritis Tylenol as needed... ~  10/11:  she reports followed by Margaret Mitchell w/ cortisone shots in neck planned- currently taking "mustard" for "body cramps" but it upset her stomach; asked to try tonic water for nocturnal leg cramps, and incr [id number] Tid Prn muscle spasm. ~  4/12:  She states she's had XRays & 2 MRIs from Margaret Mitchell w/ "alot of arthritis & degenerating discs"; given 2 shots- helped short term;  She stopped Soma & just uses Alpraz Prn... ~  9/13: c/o 44mo right shoulder pain& Dx w/ tendonitis per Margaret Mitchell- shot given...  OSTEOPOROSIS (ICD-733.00) - on Calcium and Vits w/ 1000 VitD... off prev fosamax w/ norm BMD... ~  BMD 4/09 much improved w/ TScores -0.2 to -0.7.Marland KitchenMarland Kitchen Fosamax decr to [employer]. ~  Vit D level 4/09 = 32... rec> Vit D 1000 u daily... ~  Vit D level 4/10 = 46... rec> continue same. ~  5/11:  BMD here showed TScores -0.7 in Spine, and -0.5 in left FemNeck... rec> stop Fosamax for drug holiday.  HEADACHE (ICD-784.0) >> seen by Margaret Mitchell w/ mult symptoms including HA, Dizzy, ?RLS, ?Neuropathy.>>  DIZZINESS >> Eval 10/12 by Margaret Mitchell-Mitchell w/ prob BPPV & he suggested vestib rehab... ~  MRI/ MRA showed sm vessel dis, otherw no acute changes, prob hypoplastic right vertebral art...  ?PERIPHERAL NEUROPATHY >> ?Etiology w/ eval from Margaret Mitchell 10/12 including PN Labs- all essentially wnl x sed=35/ crp=2.37... ~  EMG/NCV done in [place] due to her [employer] insurance ==> evid for chr right L5-S1 radiculopathy ~  Margaret Mitchell also thought she had RLS & gave her a Requip trial=> & improved.Marland KitchenMarland Kitchen  ANXIETY (ICD-300.00) - on ALPRAZOLAM 0.5mg  as needed... her husb had an MI, cares for elderly mother.  S/P Shingles >> she had an outbreak on the right side of her neck 3/13 & treated by TP w/ Valtrex and  resolved...    Past Surgical History  Procedure Date  . Parathyroid adenoma removed   . Vesicovaginal fistula closure w/ tah   . Tonsillectomy   . Appendectomy   . Abdominal hysterectomy 1983    TAH,BSO  . Tubal ligation 1981  . Rotator cuff repair     X2  . Foot surgery     RIGHT AND LEFT  . Colposcopy   . Oophorectomy     BSO  . Cataract extraction 2013  . Shoulder injection 10/2011    Outpatient Encounter Prescriptions as of 12/30/2011  Medication Sig Dispense Refill  . ALPRAZolam (XANAX) 0.5 MG tablet Take 1 tablet (0.5 mg total) by mouth 3 (three) times daily as needed.  90 tablet  5  . amLODipine-benazepril (LOTREL) 5-20 MG per capsule Take 1 capsule by mouth daily.  90 capsule  3  . aspirin 81 MG tablet Take 81 mg by mouth daily.        . cholecalciferol (VITAMIN D) 1000 UNITS tablet Take 1,000 Units by mouth daily.      Marland Kitchen estrogens, conjugated, (PREMARIN) 0.625 MG tablet Take 1 tablet (0.625 mg total) by mouth daily.  90 tablet  3  . gabapentin (NEURONTIN) 100 MG capsule Take 100 mg by mouth daily as needed.      . gabapentin (NEURONTIN) 300 MG capsule Take 300 mg by mouth at bedtime.      . metoprolol succinate (TOPROL-XL) 100 MG 24 hr tablet Take 1/2 tablet by mouth daily  45 tablet  3  . NEXIUM 40 MG capsule TAKE 1 CAPSULE BY MOUTH TWICE  A DAY 30 MINUTES BEFORE MEALS  180 capsule  3  . Omega-3 Fatty Acids (FISH OIL) 1000 MG CAPS Take as directed       . [DISCONTINUED] amoxicillin-clavulanate (AUGMENTIN) 875-125 MG per tablet Take 1 tablet by mouth 2 (two) times daily.  14 tablet  0  . [DISCONTINUED] Calcium Carb-Cholecalciferol (CALCIUM 1000 + D PO) Take 1 capsule by mouth daily.        . [DISCONTINUED] rOPINIRole (REQUIP) 0.5 MG tablet 1 at bedtime        Allergies  Allergen Reactions  . Prednisone     REACTION: heart palpatations  In larger doses  . Rofecoxib     REACTION: mouth swelling (VIOXX)  . Sulfonamide Derivatives     REACTION: pt unable to  remember  . Terfenadine     SELDANE    Current Medications, Allergies, Past Medical History, Past Surgical History, Family History, and Social History were reviewed in [employer] record.    Review of Systems        See HPI - all other systems neg except as noted... The patient complains of dyspnea on exertion.  The patient denies anorexia, fever, weight loss, weight gain, vision loss, decreased hearing, hoarseness, chest pain, syncope, peripheral edema, prolonged cough, headaches, hemoptysis, abdominal pain, melena, hematochezia, severe indigestion/heartburn, hematuria, incontinence, muscle weakness, suspicious skin lesions, transient blindness, difficulty walking, depression, unusual weight change, abnormal bleeding, enlarged lymph nodes, and angioedema.     Objective:   Physical Exam      WD, WN, 71 y/o BF in NAD... GENERAL:  Alert & oriented; pleasant & cooperative... HEENT:  [place]/AT,  EOM-wnl, PERRLA, no nystagmus, EACs-clear, TMs-wnl, NOSE- sl red w/ drainage, THROAT-clear & wnl. NECK:  Supple w/ fairROM; no JVD; normal carotid impulses w/o bruits; no thyromegaly or nodules palpated; no lymphadenopathy. CHEST:  Clear to P & A; without wheezes/ rales/ or rhonchi. HEART:  Regular Rhythm; without murmurs/ rubs/ or gallops. ABDOMEN:  Soft & nontender; normal bowel sounds; no organomegaly or masses detected. EXT: without deformities, mild arthritic changes; no varicose veins/ +venous insuffic/ no edema. NEURO:  CN's intact; motor testing normal; sensory testing ?diminished peripherally; ?balance off but no specific gait abn noted. Decreased ankle jerk reflexes... DERM:  No lesions noted; no rash etc..  RADIOLOGY DATA:  Reviewed in the EPIC EMR & discussed w/ the patient...  LABORATORY DATA:  Reviewed in the EPIC EMR & discussed w/ the patient...   Assessment & Plan:    Headaches>  We discussed further eval in HA clinic...  Dizziness/ HA>  ?BPPV & we  reviewed Epley maneuver w/ her; she is already on ASA daily w/ her known sm vessel dis; she is reassured by the Mitchell eval from Margaret Mitchell...  ?Peripheral Neuropathy/ ?RLS>  She is getting work up from [employer] given trial of Requip...   HBP>  Controlled on BBlocker, ACE/CCB; continue the Metoprolol at 1/2 of the 100mg  tab in AM, & the Lotrel PM dosing; she will monitor her BP.  CHOL>  FLP is close to goal values on the diet & exercise program, no meds other than Fish Oil; continue same...  GERD>  Controlled on Nexium Bid...  DJD>  Followed by Margaret Mitchell, eval as above & she uses arthritis tylenol alone (doesn't want stronger pain meds)...  Osteopenia>  Last BMD 5/11 was WNL & she is on a bisphos drug holiday...  Anxiety>  She is under a mod amt of stress & uses Alpraz prn...   Patient's Medications  New Prescriptions   No medications on file  Previous Medications   ALPRAZOLAM (XANAX) 0.5 MG TABLET    Take 1 tablet (0.5 mg total) by mouth 3 (three) times daily as needed.   AMLODIPINE-BENAZEPRIL (LOTREL) 5-20 MG PER CAPSULE    Take 1 capsule by mouth daily.   ASPIRIN 81 MG TABLET    Take 81 mg by mouth daily.     CHOLECALCIFEROL (VITAMIN D) 1000 UNITS TABLET    Take 1,000 Units by mouth daily.   ESTROGENS, CONJUGATED, (PREMARIN) 0.625 MG TABLET    Take 1 tablet (0.625 mg total) by mouth daily.   GABAPENTIN (NEURONTIN) 100 MG CAPSULE    Take 100 mg by mouth daily as needed.   GABAPENTIN (NEURONTIN) 300 MG CAPSULE    Take 300 mg by mouth at bedtime.   METOPROLOL SUCCINATE (TOPROL-XL) 100 MG 24 HR TABLET    Take 1/2 tablet by mouth daily   NEXIUM 40 MG CAPSULE    TAKE 1 CAPSULE BY MOUTH TWICE  A DAY 30 MINUTES BEFORE MEALS   OMEGA-3 FATTY ACIDS (FISH OIL) 1000 MG CAPS    Take as directed   Modified Medications   No medications on file  Discontinued Medications   AMOXICILLIN-CLAVULANATE (AUGMENTIN) 875-125 MG PER TABLET    Take 1 tablet by mouth 2 (two) times daily.   CALCIUM  CARB-CHOLECALCIFEROL (CALCIUM 1000 + D PO)    Take 1 capsule by mouth daily.     ROPINIROLE (REQUIP) 0.5 MG TABLET    1 at bedtime

## 2012-01-10 DIAGNOSIS — M542 Cervicalgia: Secondary | ICD-10-CM | POA: Diagnosis not present

## 2012-01-10 DIAGNOSIS — IMO0001 Reserved for inherently not codable concepts without codable children: Secondary | ICD-10-CM | POA: Diagnosis not present

## 2012-01-10 DIAGNOSIS — R51 Headache: Secondary | ICD-10-CM | POA: Diagnosis not present

## 2012-01-10 DIAGNOSIS — G518 Other disorders of facial nerve: Secondary | ICD-10-CM | POA: Diagnosis not present

## 2012-02-04 ENCOUNTER — Telehealth: Payer: Self-pay | Admitting: Pulmonary Disease

## 2012-02-04 NOTE — Telephone Encounter (Signed)
Pt returned call. I asked her if she needed to see SN before her appt on 05-13-12. She says she "does not" and will speak to him at that time re: colonoscopy. Nothing further needed per pt and I will close this encounter. Margaret Mitchell

## 2012-02-04 NOTE — Telephone Encounter (Signed)
[id number] x1 

## 2012-02-04 NOTE — Telephone Encounter (Signed)
Message Copied from MyChart:   I will discuss this with Dr. Kriste Basque about getting the Colonoscopy schedule. My records show that I have not had one in a few years.      ----- Message -----    From: Jonna Clark    Sent: 11/28/2011 10:21 AM EST    To: Margaret Mitchell   Subject: RE: Appointment Request (HM)       I called pt and LMOM for her to call back and schedule an appt as no avail until 01-06-12 w/ dr Kriste Basque. Dr. Kriste Basque will be happy to see her after this date or of course call if she is having any problems and needs to be seen sooner please call us.       ----- Message -----   From: Margaret Mitchell   Sent: 11/27/2011 7:48 PM EST   To: Patient HM Schedule Request Mailing List   Subject: Appointment Request (HM)      Appointment Request From: Margaret Mitchell      With Provider: Michele Mcalpine, MD [-Primary Care Physician-]      Preferred Date Range: From 12/05/2011 To 12/06/2011      Preferred Times: Thur Morning      Reason: To address the following health maintenance concerns.   Colonoscopy      Comments:   If these dates are not good please give me a call for scheduling.   Thanks

## 2012-02-06 DIAGNOSIS — G518 Other disorders of facial nerve: Secondary | ICD-10-CM | POA: Diagnosis not present

## 2012-02-06 DIAGNOSIS — R51 Headache: Secondary | ICD-10-CM | POA: Diagnosis not present

## 2012-02-06 DIAGNOSIS — G43719 Chronic migraine without aura, intractable, without status migrainosus: Secondary | ICD-10-CM | POA: Diagnosis not present

## 2012-02-06 DIAGNOSIS — M542 Cervicalgia: Secondary | ICD-10-CM | POA: Diagnosis not present

## 2012-03-19 DIAGNOSIS — R51 Headache: Secondary | ICD-10-CM | POA: Diagnosis not present

## 2012-03-19 DIAGNOSIS — G43719 Chronic migraine without aura, intractable, without status migrainosus: Secondary | ICD-10-CM | POA: Diagnosis not present

## 2012-04-06 ENCOUNTER — Telehealth: Payer: Self-pay | Admitting: Pulmonary Disease

## 2012-04-06 DIAGNOSIS — R21 Rash and other nonspecific skin eruption: Secondary | ICD-10-CM

## 2012-04-06 NOTE — Telephone Encounter (Signed)
Patient has an area on left arm in the bend that is about the size of a silver dollar. Edges are red in color and the center has a "tint" of blue. Has had this for 1 week, denies any itching or burning, area is not raised. Patient states the only "new" thing she has done is started the gabapentin in November 2013 and had an increase to 600mg  daily in [place] 2013. Patient placed Lotrim cream on area 1 day ago, has not been able to tell a diff at this time. Dr. Kriste Basque please advise, thank you  Last OV:12/30/11 Next OV:05/13/12  Allergies  Allergen Reactions  . Prednisone     REACTION: heart palpatations  In larger doses  . Rofecoxib     REACTION: mouth swelling (VIOXX)  . Sulfonamide Derivatives     REACTION: pt unable to remember  . Terfenadine     SELDANE

## 2012-04-06 NOTE — Telephone Encounter (Signed)
Per SN ---  This needs to be checked---not able to dx over the phone.  Either with our office or with dermatology.   i attempted to call the pt but she was not home.  She will call back tomorrow.

## 2012-04-06 NOTE — Telephone Encounter (Signed)
Pt called back and she requested that we put the order in for derm.  This order has been placed and pt is aware

## 2012-04-09 DIAGNOSIS — D239 Other benign neoplasm of skin, unspecified: Secondary | ICD-10-CM | POA: Diagnosis not present

## 2012-04-09 DIAGNOSIS — L259 Unspecified contact dermatitis, unspecified cause: Secondary | ICD-10-CM | POA: Diagnosis not present

## 2012-04-30 DIAGNOSIS — H2510 Age-related nuclear cataract, unspecified eye: Secondary | ICD-10-CM | POA: Diagnosis not present

## 2012-05-13 ENCOUNTER — Ambulatory Visit (INDEPENDENT_AMBULATORY_CARE_PROVIDER_SITE_OTHER): Payer: Medicare Other | Admitting: Pulmonary Disease

## 2012-05-13 ENCOUNTER — Other Ambulatory Visit (INDEPENDENT_AMBULATORY_CARE_PROVIDER_SITE_OTHER): Payer: Medicare Other

## 2012-05-13 ENCOUNTER — Encounter: Payer: Self-pay | Admitting: Pulmonary Disease

## 2012-05-13 VITALS — BP 120/70 | HR 61 | Temp 97.5°F | Ht 66.5 in | Wt 197.0 lb

## 2012-05-13 DIAGNOSIS — K5900 Constipation, unspecified: Secondary | ICD-10-CM

## 2012-05-13 DIAGNOSIS — F419 Anxiety disorder, unspecified: Secondary | ICD-10-CM

## 2012-05-13 DIAGNOSIS — K219 Gastro-esophageal reflux disease without esophagitis: Secondary | ICD-10-CM

## 2012-05-13 DIAGNOSIS — G589 Mononeuropathy, unspecified: Secondary | ICD-10-CM

## 2012-05-13 DIAGNOSIS — G629 Polyneuropathy, unspecified: Secondary | ICD-10-CM

## 2012-05-13 DIAGNOSIS — M949 Disorder of cartilage, unspecified: Secondary | ICD-10-CM | POA: Diagnosis not present

## 2012-05-13 DIAGNOSIS — R51 Headache: Secondary | ICD-10-CM

## 2012-05-13 DIAGNOSIS — F411 Generalized anxiety disorder: Secondary | ICD-10-CM

## 2012-05-13 DIAGNOSIS — M1990 Unspecified osteoarthritis, unspecified site: Secondary | ICD-10-CM

## 2012-05-13 DIAGNOSIS — E785 Hyperlipidemia, unspecified: Secondary | ICD-10-CM

## 2012-05-13 DIAGNOSIS — I10 Essential (primary) hypertension: Secondary | ICD-10-CM

## 2012-05-13 DIAGNOSIS — M8580 Other specified disorders of bone density and structure, unspecified site: Secondary | ICD-10-CM

## 2012-05-13 DIAGNOSIS — K449 Diaphragmatic hernia without obstruction or gangrene: Secondary | ICD-10-CM

## 2012-05-13 DIAGNOSIS — K589 Irritable bowel syndrome without diarrhea: Secondary | ICD-10-CM

## 2012-05-13 LAB — CBC WITH DIFFERENTIAL/PLATELET
Basophils Absolute: 0 K/uL (ref 0.0–0.1)
Basophils Relative: 0.6 % (ref 0.0–3.0)
Eosinophils Absolute: 0.1 K/uL (ref 0.0–0.7)
Eosinophils Relative: 1.7 % (ref 0.0–5.0)
HCT: 39.2 % (ref 36.0–46.0)
Hemoglobin: 13.6 g/dL (ref 12.0–15.0)
Lymphocytes Relative: 38.2 % (ref 12.0–46.0)
Lymphs Abs: 3 K/uL (ref 0.7–4.0)
MCHC: 34.6 g/dL (ref 30.0–36.0)
MCV: 90.5 fl (ref 78.0–100.0)
Monocytes Absolute: 0.7 K/uL (ref 0.1–1.0)
Monocytes Relative: 8.2 % (ref 3.0–12.0)
Neutro Abs: 4.1 K/uL (ref 1.4–7.7)
Neutrophils Relative %: 51.3 % (ref 43.0–77.0)
Platelets: 262 K/uL (ref 150.0–400.0)
RBC: 4.33 Mil/uL (ref 3.87–5.11)
RDW: 13 % (ref 11.5–14.6)
WBC: 8 K/uL (ref 4.5–10.5)

## 2012-05-13 LAB — BASIC METABOLIC PANEL WITH GFR
BUN: 14 mg/dL (ref 6–23)
CO2: 28 meq/L (ref 19–32)
Calcium: 9 mg/dL (ref 8.4–10.5)
Chloride: 106 meq/L (ref 96–112)
Creatinine, Ser: 0.8 mg/dL (ref 0.4–1.2)
GFR: 89.9 mL/min (ref >60.00)
Glucose, Bld: 87 mg/dL (ref 70–99)
Potassium: 4 meq/L (ref 3.5–5.1)
Sodium: 140 meq/L (ref 135–145)

## 2012-05-13 LAB — LIPID PANEL
Cholesterol: 188 mg/dL (ref 0–200)
HDL: 66.5 mg/dL (ref >39.00)
LDL Cholesterol: 97 mg/dL (ref 0–99)
Total CHOL/HDL Ratio: 3
Triglycerides: 125 mg/dL (ref 0.0–149.0)
VLDL: 25 mg/dL (ref 0.0–40.0)

## 2012-05-13 LAB — HEPATIC FUNCTION PANEL
AST: 22 U/L (ref 0–37)
Total Bilirubin: 0.7 mg/dL (ref 0.3–1.2)

## 2012-05-13 LAB — TSH: TSH: 2.88 u[IU]/mL (ref 0.35–5.50)

## 2012-05-13 NOTE — Progress Notes (Signed)
Subjective:    Patient ID: Margaret Mitchell, female    DOB: 1940-07-07, 72 y.o.   MRN: [id number]  HPI 72 y/o BF here for a follow up visit... she has mult med problems as noted below... she gets her meds from [facility]... she is the daughter of Margaret Mitchell.  ~  November 13, 2010:  43mo ROV & she saw DrWong, [facility] 10/12 for her dizziness & agreed that this was likely BPPV, plus he was concerned for a periph neuropathy on exam; he did MRI/MRA (small vessel dis- see report) and Periph neuropathy labs (essentially neg x incr sed & crp); he plans EMG/NCV- pending...  She also saw DrGottsegen 9/12 for f/u of a perineal cyst & routine f/u> doing satis, no changes made & she remains on oral estrogen for her vasomotor symptoms... She reports BP ok at home & meds unchanged from prev- see prob list below & ok flu shot today...   ~  May 14, 2011:  21mo ROV & Margaret Mitchell is doing reasonably well but very anxious in general & wonders if there is a test for Parkinsons because sometimes she shakes, wants BS checked because of +FamHx DM, sched for left eye cast surg soon & still caring for her mother Margaret Mitchell) in [place];  As noted prev- she has seen DrWong for dizziness (+HAs/ RLS/ ?neuropathy) & he thought it was likely BPPV & Rx w/ Epley maneuvers;  She was seen by Gyn DrFontaine 2/13 (yeast treated w/ Diflucan) & his notes are reviewed in Epic;  Finally she saw TP 3/13 w/ Shingles right neck area & rash resolved on Rx... on the positive side she has lost some weight & labs are all wnl; she requests refill Alprazolam- ok...  We reviewed her problem list, medications, xrays & labs> see below>> LABS 4/13:  FLP- at goals on diet alone;  Chems- wnl;  CBC- wnl;  TSH=1.60  ~  November 12, 2011:  21mo ROV & [name]'s CC is persistent HAs> she saw TP 8/13 w/ dizziness felt to be BPPV & treated w/ Meclizine, she had prev eval by Neuro w/ MRI etc;  We discussed need to refer to HA management clinic...    Hx  HBP controlled on Lotrel w/ BP= 112/60;  Chol looks good fish oil but wt is up 11# to 194# & we reviewed diet, exercise & wt reduction strategies;  She developed worsening right shoulder rotator cuff tendonitis per DrWainer & shot helped...    We reviewed prob list, meds, xrays and labs> see below for updates >> OK Flu vaccine today & Rx written for shingles shot  ~  December 30, 2011:  6wk ROV & Margaret Mitchell returns for an add-on visit to discuss questions she has over her eval by DrFreeman; she saw him 11/21/11 for eval of HAs & his note is reviewed> hx diagnosed chr migrains, supraorbital neuralgia, occipital neuralgia, cervicalgia, myalgia, spasm of muscles, & prob medication overuse headaches;  He ordered EMGs (we don't have this report) & told her she had neuropathy (apparently in her right leg) & prescribed GABAPENTIN 300mg  Qhs & extra 100mg  during the day prn;  She is receiving nerve block shots in her neck & reports that these are helping;  She was informed that some people w/ neuropathy have DM & Thyroid dis- I reassured her that she does NOT have either of these conditions & that DrFreeman would pursue the etiology w/ MRI of spine if & when he felt  it was indicated...    HBP> on MetopER50, Lotrel5-20; BP= 110/72 & she denies recent HA, CP, palpit, SOB, edema, etc...    Chol> on FishOil & diet; last FLP 4/13 showed TChol 189, TG 117, HDL 83, LDL 82    GI- GERD, IBS, Constip> on Nexium40prn; she denies abd pain, n/v, c/d, blood seen...    DJD> on OTC analgesics as needed; followed by DrWainer (see below)...    Osteopenia> on calcium, MVI, VitD; last BMD improved & she's been on a bisphos drug holiday...    HAs> see above for recent eval by DrFreeman- on Gabapentin300Qhs & occipital neuralgia injections...    Neuropathy> she had prev neuropathy work up by [employer] w/ evid of chronic right L5-S1 radiculopathy...    Anxiety> on Alprazolam that also helps her sleep... We reviewed prob list, meds, xrays and  labs> see below for updates >>   ~  May 13, 2012:  66mo ROV & Margaret Mitchell is doing satis, only c/o constip & we reviewed Miralax, Senakot-S etc;  We reviewed the following medical problems during today's office visit >>     HBP> on MetopER50, Lotrel5-20; BP= 120/70 & she denies recent HA, CP, palpit, SOB, edema, etc...    Chol> on FishOil & diet; FLP 4/14 shows TChol 188, TG 125, HDL 67, LDL 97    GI- GERD, IBS, Constip> on Nexium40prn; she denies abd pain, n/v, c/d, blood seen...    DJD> on OTC analgesics as needed; followed by DrWainer (see below)...    Osteopenia> on calcium, MVI, VitD1000; last BMD improved & she's been on a bisphos drug holiday...    HAs> on Gabapentin300Qhs & occipital neuralgia injections; followed by DrFreeman- last OV 11/13 reviewed     Neuropathy> s/p neuropathy work up by [employer] w/ evid of chronic right L5-S1 radiculopathy; on Neurontin300Qhs & 100mg Prn days if needed but she feels this is causing depression stating that she thinks of dying all day long; Rec to stop Neurontin & consider antidepressant but she wants to wait until her f/u appt w/ Neuro, DrFreeman...    ?RLS> she uses Requip at bedtime only if needed for RLS...    Anxiety> on Alprazolam0.5mg  prn which helps her stress she says... We reviewed prob list, meds, xrays and labs> see below for updates >>  LABS 4/14:  FLP- at goals on diet+FishOil;  Chems- wnl;  CBC- wnl;  TSH=2.88;  VitD=44 on 1000u daily...          Problem List:  Health Maintenance - she takes ASA 81mg /d... her GYN is DrGottsegen- on Premarin 0.625mg /d... [facility]ammograms at [facility]... BMD here 5/11 w/ normal TScores & off Fosamax now for drug holiday. ~  Immunizations: she gets yearly Flu vaccines in the fall... had Pneumovax 2003 at age 70>  repeat PNEUMOVAX given 4/11 age 74.  ALLERGY (ICD-995.3) - Rx w/ Claritin, Saline, Flonase w/ prev ENT eval by DrShoemaker. ~  10/11:  seen w/ sinusitis- Rx Augmentin, Depo, Mucinex,  Saline...  HYPERTENSION (ICD-401.9) - on ASA 81mg /d, TOPROL XL 100mg - 1/2 tab in AM, & LOTREL 5/20 Daily in PM...  ~  CXR 4/11 was clear, WNL...  ~  2/12:  BP= 136/88 and similiar at home per pt in the 130/80 range... denies visual changes, CP, palipit, dizziness, syncope, dyspnea, edema, etc... she exercises by walking... ~  4/12:  BP= 114/68 and continues to feel well overall... ~  8/12:  BP= 120/70 sitting/ standing (140 supine) & we decided to decr the Metoprolol  to 1/2 tab in AM & take the Lotrel in PM. ~  10/12:  BP= 120/74 w/o postural change & she denies CP, palpit, SOB, edema, etc... ~  CXR 10/12 showed normal heart size, clear lungs, NAD... ~  4/13:  BP= 128/78 & she denies HA, CP, palpit, SOB, edema, etc... ~  4/14:  on MetopER50, Lotrel5-20; BP= 120/70 & she denies recent HA, CP, palpit, SOB, edema, etc.  HYPERCHOLESTEROLEMIA, BORDERLINE (ICD-272.4) - on diet alone + Fish Oil... ~  FLP 4/10 (wt=203#) showed TChol 209, TG 116, HDL 67, LDL 105 ~  FLP 4/11 (wt=210#) showed TChol 175, TG 138, HDL 61, LDL 87 ~  FLP 4/12 (wt=211#) showed TChol 203, TG 169, HDL 65, LDL 105 ~  FLP 4/13 (wt=183#) showed TChol 189, TG 117, HDL 83, LDL 82... Great job w/ wt reduction! ~  FLP 4/14 on diet alone (wt=197#) showed TChol 188, TG 125, HDL 67, LDL 97  GERD (ICD-530.81) - on NEXIUM 40mg Bid... ~  last EGD was 4/07 by DrPatterson showing chr GERD and gastric polyps...  ~  AbdSonar 2007 w/ no gallstones, negative sonar...  ~  HIDA Scan 5/09 was norm w/ eject fract 76% & patent ducts...  IRRITABLE BOWEL SYNDROME (ICD-564.1) -  ~  last colonoscopy 9/07 by DrPatterson showed melanosis only, no other abn...  CONSTIPATION (ICD-564.00) - on Metamucil etc...  DEGENERATIVE JOINT DISEASE (ICD-715.90) - she uses Arthritis Tylenol as needed... ~  10/11:  she reports followed by DrWainer w/ cortisone shots in neck planned- currently taking "mustard" for "body cramps" but it upset her stomach; asked to try  tonic water for nocturnal leg cramps, and incr [id number] Tid Prn muscle spasm. ~  4/12:  She states she's had XRays & 2 MRIs from DrWainer w/ "alot of arthritis & degenerating discs"; given 2 shots- helped short term;  She stopped Soma & just uses Alpraz Prn... ~  9/13: c/o 27mo right shoulder pain& Dx w/ tendonitis per DrWainer- shot given...  OSTEOPOROSIS (ICD-733.00) - on Calcium and Vits w/ 1000 VitD... off prev fosamax w/ norm BMD... ~  BMD 4/09 much improved w/ TScores -0.2 to -0.7.Marland KitchenMarland Kitchen Fosamax decr to [employer]. ~  Vit D level 4/09 = 32... rec> Vit D 1000 u daily... ~  Vit D level 4/10 = 46... rec> continue same. ~  5/11:  BMD here showed TScores -0.7 in Spine, and -0.5 in left FemNeck... rec> stop Fosamax for drug holiday.  HEADACHE (ICD-784.0) >> seen by DrWong w/ mult symptoms including HA, Dizzy, ?RLS, ?Neuropathy.>> ~  CT Head 8/12 showed chr microvasc dis, no acute findings... ~  MRI 10/12 showed sm vessel dis, no acute abnormalities... ~  s/p neuropathy work up by [employer] w/ evid of chronic right L5-S1 radiculopathy; She saw DrFreeman 11/13- Rx Neurontin300Qhs & 100mg Prn days if needed, but she feels this is causing depression stating that she thinks of dying all day long; Rec to stop Neurontin & consider antidepressant but she wants to wait until her f/u appt w/ Neuro, DrFreeman...  DIZZINESS >> Eval 10/12 by DrWong-Neurology w/ prob BPPV & he suggested vestib rehab... ~  MRI/ MRA showed sm vessel dis, otherw no acute changes, prob hypoplastic right vertebral art...  ?PERIPHERAL NEUROPATHY >> ?Etiology w/ eval from DrWong 10/12 including PN Labs- all essentially wnl x sed=35/ crp=2.37... ~  EMG/NCV done in [place] due to her [employer] insurance ==> evid for chr right L5-S1 radiculopathy ~  DrWong also thought she had RLS & gave her a  Requip trial=> & improved & she uses this just prn...  ANXIETY (ICD-300.00) - on ALPRAZOLAM 0.5mg  as needed... her husb had an MI, cares for elderly mother.  S/P  Shingles >> she had an outbreak on the right side of her neck 3/13 & treated by TP w/ Valtrex and resolved...  ~  4/14:  S/p shingles vaccine given at Pharmacy...   Past Surgical History  Procedure Laterality Date  . Parathyroid adenoma removed    . Vesicovaginal fistula closure w/ tah    . Tonsillectomy    . Appendectomy    . Abdominal hysterectomy  1983    TAH,BSO  . Tubal ligation  1981  . Rotator cuff repair      X2  . Foot surgery      RIGHT AND LEFT  . Colposcopy    . Oophorectomy      BSO  . Cataract extraction  2013  . Shoulder injection  10/2011    Outpatient Encounter Prescriptions as of 05/13/2012  Medication Sig Dispense Refill  . ALPRAZolam (XANAX) 0.5 MG tablet Take 1 tablet (0.5 mg total) by mouth 3 (three) times daily as needed.  90 tablet  5  . amLODipine-benazepril (LOTREL) 5-20 MG per capsule Take 1 capsule by mouth daily.  90 capsule  3  . aspirin 81 MG tablet Take 81 mg by mouth daily.        . cholecalciferol (VITAMIN D) 1000 UNITS tablet Take 1,000 Units by mouth daily.      Marland Kitchen estrogens, conjugated, (PREMARIN) 0.625 MG tablet Take one tablet by mouth three times per week      . gabapentin (NEURONTIN) 100 MG capsule Take 100 mg by mouth daily as needed.      . gabapentin (NEURONTIN) 300 MG capsule Take 300 mg by mouth at bedtime.      . metoprolol succinate (TOPROL-XL) 100 MG 24 hr tablet Take 1/2 tablet by mouth daily  45 tablet  3  . NEXIUM 40 MG capsule TAKE 1 CAPSULE BY MOUTH TWICE  A DAY 30 MINUTES BEFORE MEALS  180 capsule  3  . Omega-3 Fatty Acids (FISH OIL) 1000 MG CAPS Take as directed       . [DISCONTINUED] estrogens, conjugated, (PREMARIN) 0.625 MG tablet Take 1 tablet (0.625 mg total) by mouth daily.  90 tablet  3   No facility-administered encounter medications on file as of 05/13/2012.    Allergies  Allergen Reactions  . Prednisone     REACTION: heart palpatations  In larger doses  . Rofecoxib     REACTION: mouth swelling (VIOXX)  .  Sulfonamide Derivatives     REACTION: pt unable to remember  . Terfenadine     SELDANE    Current Medications, Allergies, Past Medical History, Past Surgical History, Family History, and Social History were reviewed in [employer] record.    Review of Systems        See HPI - all other systems neg except as noted... The patient complains of dyspnea on exertion.  The patient denies anorexia, fever, weight loss, weight gain, vision loss, decreased hearing, hoarseness, chest pain, syncope, peripheral edema, prolonged cough, headaches, hemoptysis, abdominal pain, melena, hematochezia, severe indigestion/heartburn, hematuria, incontinence, muscle weakness, suspicious skin lesions, transient blindness, difficulty walking, depression, unusual weight change, abnormal bleeding, enlarged lymph nodes, and angioedema.     Objective:   Physical Exam      WD, WN, 72 y/o BF in NAD... GENERAL:  Alert &  oriented; pleasant & cooperative... HEENT:  [place]/AT, EOM-wnl, PERRLA, no nystagmus, EACs-clear, TMs-wnl, NOSE- sl red w/ drainage, THROAT-clear & wnl. NECK:  Supple w/ fairROM; no JVD; normal carotid impulses w/o bruits; no thyromegaly or nodules palpated; no lymphadenopathy. CHEST:  Clear to P & A; without wheezes/ rales/ or rhonchi. HEART:  Regular Rhythm; without murmurs/ rubs/ or gallops. ABDOMEN:  Soft & nontender; normal bowel sounds; no organomegaly or masses detected. EXT: without deformities, mild arthritic changes; no varicose veins/ +venous insuffic/ no edema. NEURO:  CN's intact; motor testing normal; sensory testing ?diminished peripherally; ?balance off but no specific gait abn noted. Decreased ankle jerk reflexes... DERM:  No lesions noted; no rash etc..  RADIOLOGY DATA:  Reviewed in the EPIC EMR & discussed w/ the patient...  LABORATORY DATA:  Reviewed in the EPIC EMR & discussed w/ the patient...   Assessment & Plan:    Headaches>  On Neurontin per DrFreeman  & HAs improved but she is concerned this is making her depressed=> Rec to stop the Gabapentin but she wants to wait til f/u appt w/ DrFreeman.  Dizziness/ HA>  ?BPPV & we reviewed Epley maneuver w/ her; she is already on ASA daily w/ her known sm vessel dis; she is reassured by the prev Neurology eval from DrWong...  ?Peripheral Neuropathy/ ?RLS>  She hadwork up from [employer] given trial of Requip...   HBP>  Controlled on BBlocker, ACE/CCB; continue the Metoprolol at 1/2 of the 100mg  tab in AM, & the Lotrel PM dosing; she will monitor her BP.  CHOL>  FLP is close to goal values on the diet & exercise program, no meds other than Fish Oil; continue same...  GERD>  Controlled on Nexium...  DJD>  Followed by DrWainer, eval as above & she uses arthritis tylenol alone (doesn't want stronger pain meds)...  Osteopenia>  Last BMD 5/11 was WNL & she is on a bisphos drug holiday per Gyn...Marland KitchenMarland KitchenMarland Kitchen  Anxiety>  She is under a mod amt of stress & uses Alpraz prn...   Patient's Medications  New Prescriptions   No medications on file  Previous Medications   ALPRAZOLAM (XANAX) 0.5 MG TABLET    Take 1 tablet (0.5 mg total) by mouth 3 (three) times daily as needed.   AMLODIPINE-BENAZEPRIL (LOTREL) 5-20 MG PER CAPSULE    Take 1 capsule by mouth daily.   ASPIRIN 81 MG TABLET    Take 81 mg by mouth daily.     CHOLECALCIFEROL (VITAMIN D) 1000 UNITS TABLET    Take 1,000 Units by mouth daily.   GABAPENTIN (NEURONTIN) 100 MG CAPSULE    Take 100 mg by mouth daily as needed.   GABAPENTIN (NEURONTIN) 300 MG CAPSULE    Take 300 mg by mouth at bedtime.   METOPROLOL SUCCINATE (TOPROL-XL) 100 MG 24 HR TABLET    Take 1/2 tablet by mouth daily   OMEGA-3 FATTY ACIDS (FISH OIL) 1000 MG CAPS    Take as directed    ROPINIROLE (REQUIP) 0.5 MG TABLET    Take 0.5 mg by mouth at bedtime as needed.  Modified Medications   Modified Medication Previous Medication   ESOMEPRAZOLE (NEXIUM) 40 MG CAPSULE NEXIUM 40 MG capsule           TAKE 1 CAPSULE BY MOUTH TWICE  A DAY 30 MINUTES BEFORE MEALS   ESTROGENS, CONJUGATED, (PREMARIN) 0.625 MG TABLET estrogens, conjugated, (PREMARIN) 0.625 MG tablet      Take one tablet by mouth three times per week  Take 1 tablet (0.625 mg total) by mouth daily.  Discontinued Medications   No medications on file

## 2012-05-13 NOTE — Patient Instructions (Addendum)
Today we updated your med list in our EPIC system...    Continue your current medications the same...  You may want to try weaning down the GABAPENTIN as we discussed-     wathching the affect on your headaches, and depressive symptoms...  Today we did your follow up FASTING blood work...    We will contact you w/ the results when available...   Call for any questions...  Let's plan a follow up visit in 52mo, sooner if needed for problems.Marland KitchenMarland Kitchen

## 2012-06-01 DIAGNOSIS — M5137 Other intervertebral disc degeneration, lumbosacral region: Secondary | ICD-10-CM | POA: Diagnosis not present

## 2012-06-01 DIAGNOSIS — M25559 Pain in unspecified hip: Secondary | ICD-10-CM | POA: Diagnosis not present

## 2012-06-19 DIAGNOSIS — R51 Headache: Secondary | ICD-10-CM | POA: Diagnosis not present

## 2012-06-19 DIAGNOSIS — G43719 Chronic migraine without aura, intractable, without status migrainosus: Secondary | ICD-10-CM | POA: Diagnosis not present

## 2012-07-08 DIAGNOSIS — S90129A Contusion of unspecified lesser toe(s) without damage to nail, initial encounter: Secondary | ICD-10-CM | POA: Diagnosis not present

## 2012-07-21 ENCOUNTER — Telehealth: Payer: Self-pay | Admitting: Pulmonary Disease

## 2012-07-21 MED ORDER — AMLODIPINE BESY-BENAZEPRIL HCL 5-20 MG PO CAPS: 1.0000 | ORAL_CAPSULE | Freq: Every day | ORAL | Status: DC

## 2012-07-21 NOTE — Telephone Encounter (Signed)
ATC patient no answer LMOMTCB 

## 2012-07-21 NOTE — Telephone Encounter (Signed)
lmtcb x1--pt needs to call the health dept to see if she needs any vaccinations. [facility]

## 2012-07-21 NOTE — Telephone Encounter (Signed)
PT IS CALLING BACK [phone]

## 2012-07-21 NOTE — Telephone Encounter (Signed)
Pt is aware that her medication has been sent in. Aware to call the health department about vaccines.

## 2012-07-21 NOTE — Telephone Encounter (Signed)
Patient returning call.

## 2012-07-25 ENCOUNTER — Other Ambulatory Visit: Payer: Self-pay | Admitting: Pulmonary Disease

## 2012-09-08 ENCOUNTER — Telehealth: Payer: Self-pay | Admitting: Pulmonary Disease

## 2012-09-08 NOTE — Telephone Encounter (Signed)
Per SN--  Yes ok to ask him to eval----could be neuropathy and may need further testing.  thanks

## 2012-09-08 NOTE — Telephone Encounter (Signed)
Spoke with the pt and notified of recs per SN She verbalized understanding and states nothing further needed

## 2012-09-08 NOTE — Telephone Encounter (Signed)
Called spoke with patient who stated that about 10 days ago she was sitting for an extended period of time and when she stood up, she noticed a burning sensation in both of her legs from the knee to the ankle.  Patient stated her legs feel like they have been sitting in "too hot water."  Patient denies any redness, pain, discoloration in her feet.  Gait is normal.  She does have an appt with her neurologist DrFreeman on Friday 8.29.14 and wonders if this is something that he should treat.  SN nor TP with openings today.  Pt prefers recs over the phone. Last ov w/ SN 4.30.14, follow up in 6 months >> 11.3.14  Dr Kriste Basque please advise, thank you.

## 2012-09-11 DIAGNOSIS — R51 Headache: Secondary | ICD-10-CM | POA: Diagnosis not present

## 2012-09-11 DIAGNOSIS — G43719 Chronic migraine without aura, intractable, without status migrainosus: Secondary | ICD-10-CM | POA: Diagnosis not present

## 2012-09-23 ENCOUNTER — Encounter: Payer: Self-pay | Admitting: Pulmonary Disease

## 2012-09-24 NOTE — Telephone Encounter (Signed)
Please advise SN thanks 

## 2012-09-28 ENCOUNTER — Telehealth: Payer: Self-pay | Admitting: Pulmonary Disease

## 2012-09-28 NOTE — Telephone Encounter (Signed)
LM with pt letting her know that she can come in through the allergy lab to get her flu shot. Advised her to call back and let us know which day this week she wants to come in and get this.

## 2012-09-30 ENCOUNTER — Ambulatory Visit (INDEPENDENT_AMBULATORY_CARE_PROVIDER_SITE_OTHER): Payer: Medicare Other

## 2012-09-30 ENCOUNTER — Telehealth: Payer: Self-pay | Admitting: Pulmonary Disease

## 2012-09-30 ENCOUNTER — Ambulatory Visit (INDEPENDENT_AMBULATORY_CARE_PROVIDER_SITE_OTHER): Payer: Medicare Other | Admitting: Gynecology

## 2012-09-30 ENCOUNTER — Encounter: Payer: Self-pay | Admitting: Gynecology

## 2012-09-30 VITALS — BP 124/80 | Ht 66.0 in | Wt 197.0 lb

## 2012-09-30 DIAGNOSIS — N952 Postmenopausal atrophic vaginitis: Secondary | ICD-10-CM | POA: Diagnosis not present

## 2012-09-30 DIAGNOSIS — M810 Age-related osteoporosis without current pathological fracture: Secondary | ICD-10-CM

## 2012-09-30 DIAGNOSIS — N760 Acute vaginitis: Secondary | ICD-10-CM

## 2012-09-30 DIAGNOSIS — Z23 Encounter for immunization: Secondary | ICD-10-CM | POA: Diagnosis not present

## 2012-09-30 NOTE — Patient Instructions (Signed)
Followup with Dr. Kriste Basque for bone density. Followup with me in one year.

## 2012-09-30 NOTE — Progress Notes (Signed)
Margaret Mitchell 1940-11-17 [id number]        72 y.o.  G1P0010 for followup exam.  Former patient of Dr. Eda Mitchell. Several issues noted below.  Past medical history,surgical history, medications, allergies, family history and social history were all reviewed and documented in the EPIC chart.  ROS:  Performed and pertinent positives and negatives are included in the history, assessment and plan .  Exam: Margaret Mitchell assistant Filed Vitals:   09/30/12 0941  BP: 124/80  Height: 5\' 6"  (1.676 m)  Weight: 197 lb (89.359 kg)   General appearance  Normal Skin grossly normal Head/Neck normal with no cervical or supraclavicular adenopathy thyroid normal Lungs  clear Cardiac RR, without RMG Abdominal  soft, nontender, without masses, organomegaly or hernia Breasts  examined lying and sitting without masses, retractions, discharge or axillary adenopathy. Pelvic  Ext/BUS/vagina  normal with atrophic changes.   Adnexa  Without masses or tenderness    Anus and perineum  normal   Rectovaginal  normal sphincter tone without palpated masses or tenderness.    Assessment/Plan:  72 y.o. G55P0010 female for followup exam.   1. Postmenopausal. Status post TAH/BSO in the past. Had been on HRT most recently Premarin. She discontinued this several weeks ago and has done well without significant hot flushes sweats. We'll continue off HRT at this point per her choice. 2. Recurrent vulvovaginitis. Does well with boric acid suppositories used when necessary at earliest onset of symptoms. Will continue to do so. Currently not having an issue. 3. Osteoporosis. In review of her records had been on Actonel then Fosamax since 2004 through 2011. Most recent DEXA several years ago. Patient has appointment with Dr. Kriste Mitchell in November where she normally gets her bone densities and she's going to ask him about scheduling a followup density in followup in reference to her bone health. 4. Pap smear 2011. No Pap smear done today.  History of dysplasia before her hysterectomy 1983. Multiple normal Pap smears since then. Reviewed current screening guidelines and we both agreed to stop screening and she's comfortable with this. 5. Mammography due in October and she knows to schedule this. SBE monthly reviewed. 6. Colonoscopy 2007 with reported need to repeat in 5 years. Patient is to call and arrange this. 7. Health maintenance. No lab work done as this is all done through Dr Margaret Mitchell office.  Note: This document was prepared with digital dictation and possible smart phrase technology. Any transcriptional errors that result from this process are unintentional.   Margaret Lords MD, 10:14 AM 09/30/2012

## 2012-09-30 NOTE — Telephone Encounter (Signed)
lmomtcb x1 for pt 

## 2012-09-30 NOTE — Telephone Encounter (Signed)
Spoke with Leigh > pt should call the Health Dept for information on what vaccines will be required for travel  Called spoke with patient, advised of the above.  Pt going to [place] x9 days on 10.12.14 with a tour group.  Pt verbalized her understanding to check with the Health Dept for info on vaccines.  Nothing further needed; will sign off.

## 2012-09-30 NOTE — Telephone Encounter (Signed)
Pt returned call.  Holly D Pryor ° °

## 2012-10-05 ENCOUNTER — Telehealth: Payer: Self-pay | Admitting: Pulmonary Disease

## 2012-10-05 NOTE — Telephone Encounter (Signed)
LM with to let her know that she can come in through the allergy lab and we can give her the Tdap vaccine.

## 2012-10-13 ENCOUNTER — Ambulatory Visit (INDEPENDENT_AMBULATORY_CARE_PROVIDER_SITE_OTHER): Payer: Medicare Other

## 2012-10-13 DIAGNOSIS — Z23 Encounter for immunization: Secondary | ICD-10-CM

## 2012-11-03 DIAGNOSIS — Z1231 Encounter for screening mammogram for malignant neoplasm of breast: Secondary | ICD-10-CM | POA: Diagnosis not present

## 2012-11-05 ENCOUNTER — Encounter: Payer: Self-pay | Admitting: Gynecology

## 2012-11-16 ENCOUNTER — Encounter: Payer: Self-pay | Admitting: Pulmonary Disease

## 2012-11-16 ENCOUNTER — Ambulatory Visit (INDEPENDENT_AMBULATORY_CARE_PROVIDER_SITE_OTHER): Payer: Medicare Other | Admitting: Pulmonary Disease

## 2012-11-16 ENCOUNTER — Ambulatory Visit (INDEPENDENT_AMBULATORY_CARE_PROVIDER_SITE_OTHER)
Admission: RE | Admit: 2012-11-16 | Discharge: 2012-11-16 | Disposition: A | Discharge status: Home / Self Care | Payer: Medicare Other | Source: Ambulatory Visit | Attending: Pulmonary Disease | Admitting: Pulmonary Disease

## 2012-11-16 VITALS — BP 132/82 | HR 71 | Temp 97.6°F | Ht 66.5 in | Wt 199.2 lb

## 2012-11-16 DIAGNOSIS — I10 Essential (primary) hypertension: Secondary | ICD-10-CM

## 2012-11-16 DIAGNOSIS — K589 Irritable bowel syndrome without diarrhea: Secondary | ICD-10-CM

## 2012-11-16 DIAGNOSIS — M8580 Other specified disorders of bone density and structure, unspecified site: Secondary | ICD-10-CM

## 2012-11-16 DIAGNOSIS — K219 Gastro-esophageal reflux disease without esophagitis: Secondary | ICD-10-CM | POA: Diagnosis not present

## 2012-11-16 DIAGNOSIS — R0789 Other chest pain: Secondary | ICD-10-CM

## 2012-11-16 DIAGNOSIS — E785 Hyperlipidemia, unspecified: Secondary | ICD-10-CM | POA: Diagnosis not present

## 2012-11-16 DIAGNOSIS — M1990 Unspecified osteoarthritis, unspecified site: Secondary | ICD-10-CM

## 2012-11-16 DIAGNOSIS — R079 Chest pain, unspecified: Secondary | ICD-10-CM | POA: Diagnosis not present

## 2012-11-16 DIAGNOSIS — K5900 Constipation, unspecified: Secondary | ICD-10-CM

## 2012-11-16 DIAGNOSIS — F419 Anxiety disorder, unspecified: Secondary | ICD-10-CM

## 2012-11-16 DIAGNOSIS — M899 Disorder of bone, unspecified: Secondary | ICD-10-CM

## 2012-11-16 DIAGNOSIS — R51 Headache: Secondary | ICD-10-CM

## 2012-11-16 MED ORDER — ALPRAZOLAM 0.5 MG PO TABS: 0.5000 mg | ORAL_TABLET | Freq: Three times a day (TID) | ORAL | Status: DC | PRN

## 2012-11-16 NOTE — Progress Notes (Signed)
Subjective:    Patient ID: Margaret Mitchell, female    DOB: 1940-05-06, 72 y.o.   MRN: [id number]  HPI 72 y/o BF here for a follow up visit... she has mult med problems as noted below... she gets her meds from [facility]... she is the daughter of Margaret Mitchell.  ~  May 14, 2011:  49mo ROV & Margaret Mitchell is doing reasonably well but very anxious in general & wonders if there is a test for Parkinsons because sometimes she shakes, wants BS checked because of +FamHx DM, sched for left eye cast surg soon & still caring for her mother Margaret Mitchell) in [place];  As noted prev- she has seen DrWong for dizziness (+HAs/ RLS/ ?neuropathy) & he thought it was likely BPPV & Rx w/ Epley maneuvers;  She was seen by Gyn DrFontaine 2/13 (yeast treated w/ Diflucan) & his notes are reviewed in Epic;  Finally she saw TP 3/13 w/ Shingles right neck area & rash resolved on Rx... on the positive side she has lost some weight & labs are all wnl; she requests refill Alprazolam- ok...  We reviewed her problem list, medications, xrays & labs> see below>> LABS 4/13:  FLP- at goals on diet alone;  Chems- wnl;  CBC- wnl;  TSH=1.60  ~  November 12, 2011:  49mo ROV & Pat's CC is persistent HAs> she saw TP 8/13 w/ dizziness felt to be BPPV & treated w/ Meclizine, she had prev eval by Neuro w/ MRI etc;  We discussed need to refer to HA management clinic...    Hx HBP controlled on Lotrel w/ BP= 112/60;  Chol looks good fish oil but wt is up 11# to 194# & we reviewed diet, exercise & wt reduction strategies;  She developed worsening right shoulder rotator cuff tendonitis per DrWainer & shot helped...    We reviewed prob list, meds, xrays and labs> see below for updates >> OK Flu vaccine today & Rx written for shingles shot  ~  December 30, 2011:  6wk ROV & Margaret Mitchell returns for an add-on visit to discuss questions she has over her eval by DrFreeman; she saw him 11/21/11 for eval of HAs & his note is reviewed> hx diagnosed chr migrains,  supraorbital neuralgia, occipital neuralgia, cervicalgia, myalgia, spasm of muscles, & prob medication overuse headaches;  He ordered EMGs (we don't have this report) & told her she had neuropathy (apparently in her right leg) & prescribed GABAPENTIN 300mg  Qhs & extra 100mg  during the day prn;  She is receiving nerve block shots in her neck & reports that these are helping;  She was informed that some people w/ neuropathy have DM & Thyroid dis- I reassured her that she does NOT have either of these conditions & that DrFreeman would pursue the etiology w/ MRI of spine if & when he felt it was indicated...    HBP> on MetopER50, Lotrel5-20; BP= 110/72 & she denies recent HA, CP, palpit, SOB, edema, etc...    Chol> on FishOil & diet; last FLP 4/13 showed TChol 189, TG 117, HDL 83, LDL 82    GI- GERD, IBS, Constip> on Nexium40prn; she denies abd pain, n/v, c/d, blood seen...    DJD> on OTC analgesics as needed; followed by DrWainer (see below)...    Osteopenia> on calcium, MVI, VitD; last BMD improved & she's been on a bisphos drug holiday...    HAs> see above for recent eval by DrFreeman- on Gabapentin300Qhs & occipital neuralgia injections.Marland KitchenMarland Kitchen  Neuropathy> she had prev neuropathy work up by [employer] w/ evid of chronic right L5-S1 radiculopathy...    Anxiety> on Alprazolam that also helps her sleep... We reviewed prob list, meds, xrays and labs> see below for updates >>   ~  May 13, 2012:  52mo ROV & Margaret Mitchell is doing satis, only c/o constip & we reviewed Miralax, Senakot-S etc;  We reviewed the following medical problems during today's office visit >>     HBP> on MetopER50, Lotrel5-20; BP= 120/70 & she denies recent HA, CP, palpit, SOB, edema, etc...    Chol> on FishOil & diet; FLP 4/14 shows TChol 188, TG 125, HDL 67, LDL 97    GI- GERD, IBS, Constip> on Nexium40prn; she denies abd pain, n/v, c/d, blood seen...    DJD> on OTC analgesics as needed; followed by DrWainer (see below)...    Osteopenia> on  calcium, MVI, VitD1000; last BMD improved & she's been on a bisphos drug holiday...    HAs> on Gabapentin300Qhs & occipital neuralgia injections; followed by DrFreeman- last OV 11/13 reviewed     Neuropathy> s/p neuropathy work up by [employer] w/ evid of chronic right L5-S1 radiculopathy; on Neurontin300Qhs & 100mg Prn days if needed but she feels this is causing depression stating that she thinks of dying all day long; Rec to stop Neurontin & consider antidepressant but she wants to wait until her f/u appt w/ Neuro, DrFreeman...    ?RLS> she uses Requip at bedtime only if needed for RLS...    Anxiety> on Alprazolam0.5mg  prn which helps her stress she says... We reviewed prob list, meds, xrays and labs> see below for updates >>  LABS 4/14:  FLP- at goals on diet+FishOil;  Chems- wnl;  CBC- wnl;  TSH=2.88;  VitD=44 on 1000u daily...  ~  November 16, 2012:  66mo ROV & Margaret Mitchell notices some sharp, atypical CP intermittently; also c/o constip & rec to take Miralax & Senakot-S...    HBP treated with MetopER50, Lotrel5-20; BP= 132/82 and she denies angina, palpit, SOB, edema, etc...    Chol controlled on diet & Fish oil- last FLP 4/14 was within parameters...    She has hx GERD on Nexium40 and IBS-c asked to take Miralax 7 Senakot-S; her last colon was 2007 by DrPatterson & neg x melanosis...    GYN- DrFontaine 9/14> post menopausal atrophic vaginitis, osteoporosis, s/p TAH/BSO, she was on HRT for yrs but stopped on her own 2014 & doing satis...     She has DJD followed by DrWainer, Osteopenia 7 is due for f/u BMD, Hx HAs folloowed by DrFreeman...     Anxiety w/ elderly mother in [place]; on Alpraz prn... We reviewed prob list, meds, xrays and labs> see below for updates >> she received the 2014 flu shot in Sept; requests refill Rx for Rebeca Allegra today...  CXR 11/14 showed norm heart size, clear lungs w/ mild bibasilar scarring, NAD.Marland KitchenMarland Kitchen EKG 11/14 showed SBrady,rate58, wnl, NAD... BMD 11/14 shows TScores -1.1 in Spine, and  -0.1 in right [facility];  Stable & does not need Fosamax; continue Ca, MVI, VitD, & wt bearing exercise...          Problem List:  Health Maintenance - she takes ASA 81mg /d... her GYN is DrGottsegen- on Premarin 0.625mg /d... Mammograms at [facility] yearly... BMD here 5/11 w/ normal TScores & off Fosamax now for drug holiday. ~  Immunizations: she gets yearly Flu vaccines in the fall... had Pneumovax 2003 at age 28>  repeat PNEUMOVAX given 4/11 age 71.  ALLERGY (ICD-995.3) - Rx w/ Claritin, Saline, Flonase w/ prev ENT eval by DrShoemaker. ~  10/11:  seen w/ sinusitis- Rx Augmentin, Depo, Mucinex, Saline...  HYPERTENSION (ICD-401.9) - on ASA 81mg /d, TOPROL XL 100mg - 1/2 tab in AM, & LOTREL 5/20 Daily in PM...  ~  CXR 4/11 was clear, WNL...  ~  2/12:  BP= 136/88 and similiar at home per pt in the 130/80 range... denies visual changes, CP, palipit, dizziness, syncope, dyspnea, edema, etc... she exercises by walking... ~  4/12:  BP= 114/68 and continues to feel well overall... ~  8/12:  BP= 120/70 sitting/ standing (140 supine) & we decided to decr the Metoprolol to 1/2 tab in AM & take the Lotrel in PM. ~  10/12:  BP= 120/74 w/o postural change & she denies CP, palpit, SOB, edema, etc... ~  CXR 10/12 showed normal heart size, clear lungs, NAD... ~  4/13:  BP= 128/78 & she denies HA, CP, palpit, SOB, edema, etc... ~  4/14:  on MetopER50, Lotrel5-20; BP= 120/70 & she denies recent HA, CP, palpit, SOB, edema, etc. ~  11/14: HBP treated with MetopER50, Lotrel5-20; BP= 132/82 and she denies angina, palpit, SOB, edema, etc...  CXR 11/14 showed norm heart size, clear lungs w/ mild bibasilar scarring, NAD.Marland KitchenMarland Kitchen  EKG 11/14 showed SBrady,rate58, wnl, NAD...  HYPERCHOLESTEROLEMIA, BORDERLINE (ICD-272.4) - on diet alone + Fish Oil... ~  FLP 4/10 (wt=203#) showed TChol 209, TG 116, HDL 67, LDL 105 ~  FLP 4/11 (wt=210#) showed TChol 175, TG 138, HDL 61, LDL 87 ~  FLP 4/12 (wt=211#) showed TChol 203, TG 169, HDL  65, LDL 105 ~  FLP 4/13 (wt=183#) showed TChol 189, TG 117, HDL 83, LDL 82... Great job w/ wt reduction! ~  FLP 4/14 on diet alone (wt=197#) showed TChol 188, TG 125, HDL 67, LDL 97  GERD (ICD-530.81) - on NEXIUM 40mg ... ~  last EGD was 4/07 by DrPatterson showing chr GERD and gastric polyps...  ~  AbdSonar 2007 w/ no gallstones, negative sonar...  ~  HIDA Scan 5/09 was norm w/ eject fract 76% & patent ducts...  IRRITABLE BOWEL SYNDROME (ICD-564.1) >>  ~  last colonoscopy 9/07 by DrPatterson showed melanosis only, no other abn...  CONSTIPATION (ICD-564.00) - on Metamucil etc... Asked to start regimen w/ Miralax, Senakot-S daily...  DEGENERATIVE JOINT DISEASE (ICD-715.90) - she uses Arthritis Tylenol as needed... ~  10/11:  she reports followed by DrWainer w/ cortisone shots in neck planned- currently taking "mustard" for "body cramps" but it upset her stomach; asked to try tonic water for nocturnal leg cramps, and incr [id number] Tid Prn muscle spasm. ~  4/12:  She states she's had XRays & 2 MRIs from DrWainer w/ "alot of arthritis & degenerating discs"; given 2 shots- helped short term;  She stopped Soma & just uses Alpraz Prn... ~  9/13: c/o 80mo right shoulder pain & Dx w/ tendonitis per DrWainer- shot given...  OSTEOPOROSIS (ICD-733.00) - on Calcium and Vits w/ 1000 VitD... off prev fosamax w/ norm BMD... ~  BMD 4/09 much improved w/ TScores -0.2 to -0.7.Marland KitchenMarland Kitchen Fosamax decr to [employer]. ~  Vit D level 4/09 = 32... rec> Vit D 1000 u daily... ~  Vit D level 4/10 = 46... rec> continue same. ~  5/11:  BMD here showed TScores -0.7 in Spine, and -0.5 in left FemNeck... rec> stop Fosamax for drug holiday. ~  BMD 11/14 shows TScores -1.1 in Spine, and -0.1 in right [facility];  Stable & does  not need Fosamax; continue Ca, MVI, VitD, & wt bearing exercise...  HEADACHE (ICD-784.0) >> seen by DrWong w/ mult symptoms including HA, Dizzy, ?RLS, ?Neuropathy.>> ~  CT Head 8/12 showed chr microvasc dis, no acute  findings... ~  MRI 10/12 showed sm vessel dis, no acute abnormalities... ~  s/p neuropathy work up by [employer] w/ evid of chronic right L5-S1 radiculopathy; She saw DrFreeman 11/13- Rx Neurontin300Qhs & 100mg Prn days if needed, but she feels this is causing depression stating that she thinks of dying all day long; Rec to stop Neurontin & consider antidepressant but she wants to wait until her f/u appt w/ Neuro, DrFreeman...  DIZZINESS >> Eval 10/12 by DrWong-Neurology w/ prob BPPV & he suggested vestib rehab... ~  MRI/ MRA showed sm vessel dis, otherw no acute changes, prob hypoplastic right vertebral art...  ?PERIPHERAL NEUROPATHY >> ?Etiology w/ eval from DrWong 10/12 including PN Labs- all essentially wnl x sed=35/ crp=2.37... ~  EMG/NCV done in [place] due to her [employer] insurance ==> evid for chr right L5-S1 radiculopathy ~  DrWong also thought she had RLS & gave her a Requip trial=> & improved & she uses this just prn...  ANXIETY (ICD-300.00) - on ALPRAZOLAM 0.5mg  as needed... her husb had an MI, cares for elderly mother.  S/P Shingles >> she had an outbreak on the right side of her neck 3/13 & treated by TP w/ Valtrex and resolved...  ~  4/14:  S/p shingles vaccine given at Pharmacy...   Past Surgical History  Procedure Laterality Date  . Parathyroid adenoma removed    . Vesicovaginal fistula closure w/ tah    . Tonsillectomy    . Appendectomy    . Tubal ligation  1981  . Rotator cuff repair      X2  . Foot surgery      RIGHT AND LEFT  . Colposcopy    . Oophorectomy      BSO  . Cataract extraction  2013  . Shoulder injection  10/2011  . Abdominal hysterectomy  1983    TAH/BSO for pelvic pain and adhesions    Outpatient Encounter Prescriptions as of 11/16/2012  Medication Sig  . ALPRAZolam (XANAX) 0.5 MG tablet Take 1 tablet (0.5 mg total) by mouth 3 (three) times daily as needed.  Marland Kitchen amLODipine-benazepril (LOTREL) 5-20 MG per capsule Take 1 capsule by mouth daily.  Marland Kitchen aspirin 81  MG tablet Take 81 mg by mouth daily.    . cholecalciferol (VITAMIN D) 1000 UNITS tablet Take 1,000 Units by mouth daily.  Marland Kitchen esomeprazole (NEXIUM) 40 MG capsule Take 40 mg by mouth daily before breakfast.   . metoprolol succinate (TOPROL-XL) 100 MG 24 hr tablet TAKE ONE-HALF (1/2) TABLET DAILY  . Omega-3 Fatty Acids (FISH OIL) 1000 MG CAPS Take as directed     Allergies  Allergen Reactions  . Prednisone     REACTION: heart palpatations  In larger doses  . Rofecoxib     REACTION: mouth swelling (VIOXX)  . Sulfonamide Derivatives     REACTION: pt unable to remember  . Terfenadine     SELDANE    Current Medications, Allergies, Past Medical History, Past Surgical History, Family History, and Social History were reviewed in [employer] record.    Review of Systems        See HPI - all other systems neg except as noted... The patient complains of dyspnea on exertion.  The patient denies anorexia, fever, weight loss, weight gain, vision  loss, decreased hearing, hoarseness, chest pain, syncope, peripheral edema, prolonged cough, headaches, hemoptysis, abdominal pain, melena, hematochezia, severe indigestion/heartburn, hematuria, incontinence, muscle weakness, suspicious skin lesions, transient blindness, difficulty walking, depression, unusual weight change, abnormal bleeding, enlarged lymph nodes, and angioedema.     Objective:   Physical Exam      WD, WN, 72 y/o BF in NAD... GENERAL:  Alert & oriented; pleasant & cooperative... HEENT:  [place]/AT, EOM-wnl, PERRLA, no nystagmus, EACs-clear, TMs-wnl, NOSE- sl red w/ drainage, THROAT-clear & wnl. NECK:  Supple w/ fairROM; no JVD; normal carotid impulses w/o bruits; no thyromegaly or nodules palpated; no lymphadenopathy. CHEST:  Clear to P & A; without wheezes/ rales/ or rhonchi. HEART:  Regular Rhythm; without murmurs/ rubs/ or gallops. ABDOMEN:  Soft & nontender; normal bowel sounds; no organomegaly or masses  detected. EXT: without deformities, mild arthritic changes; no varicose veins/ +venous insuffic/ no edema. NEURO:  CN's intact; motor testing normal; sensory testing ?diminished peripherally; ?balance off but no specific gait abn noted. Decreased ankle jerk reflexes... DERM:  No lesions noted; no rash etc..  RADIOLOGY DATA:  Reviewed in the EPIC EMR & discussed w/ the patient...  LABORATORY DATA:  Reviewed in the EPIC EMR & discussed w/ the patient...   Assessment & Plan:    HBP>  Controlled on BBlocker, ACE/CCB; continue the Metoprolol at 1/2 of the 100mg  tab in AM, & the Lotrel PM dosing; she will monitor her BP.  CHOL>  FLP is close to goal values on the diet & exercise program, no meds other than Fish Oil; continue same...  GERD>  Controlled on Nexium...  DJD>  Followed by DrWainer, eval as above & she uses arthritis tylenol alone (doesn't want stronger pain meds)...  Osteopenia>  BMD 11/14 was WNL & she is on a bisphos drug holiday......  Headaches>  Prev on Neurontin per DrFreeman & HAs improved but she is concerned this is making her depressed=> Rec to stop the Gabapentin but she wants to wait til f/u appt w/ DrFreeman.  Dizziness/ HA>  ?BPPV & we reviewed Epley maneuver w/ her; she is already on ASA daily w/ her known sm vessel dis; she is reassured by the prev Neurology eval from DrWong...  ?Peripheral Neuropathy/ ?RLS>  She hadwork up from [employer] given trial of Requip...  Anxiety>  She is under a mod amt of stress & uses Alpraz prn...   Patient's Medications  New Prescriptions   No medications on file  Previous Medications   AMLODIPINE-BENAZEPRIL (LOTREL) 5-20 MG PER CAPSULE    Take 1 capsule by mouth daily.   ASPIRIN 81 MG TABLET    Take 81 mg by mouth daily.     CHOLECALCIFEROL (VITAMIN D) 1000 UNITS TABLET    Take 1,000 Units by mouth daily.   METOPROLOL SUCCINATE (TOPROL-XL) 100 MG 24 HR TABLET    TAKE ONE-HALF (1/2) TABLET DAILY   OMEGA-3 FATTY ACIDS (FISH  OIL) 1000 MG CAPS    Take as directed   Modified Medications   Modified Medication Previous Medication   ALPRAZOLAM (XANAX) 0.5 MG TABLET ALPRAZolam (XANAX) 0.5 MG tablet      Take 1 tablet (0.5 mg total) by mouth 3 (three) times daily as needed.    Take 1 tablet (0.5 mg total) by mouth 3 (three) times daily as needed.   ESOMEPRAZOLE (NEXIUM) 40 MG CAPSULE NEXIUM 40 MG capsule      TAKE 1 CAPSULE BY MOUTH TWICE  A DAY 30 MINUTES BEFORE MEALS  TAKE 1 CAPSULE BY MOUTH TWICE  A DAY 30 MINUTES BEFORE MEALS   ESTROGENS, CONJUGATED, (PREMARIN) 0.625 MG TABLET estrogens, conjugated, (PREMARIN) 0.625 MG tablet      Take 1 tablet by mouth daily    Take 1 tablet by mouth daily  Discontinued Medications   ESOMEPRAZOLE (NEXIUM) 40 MG CAPSULE    Take 40 mg by mouth daily before breakfast.

## 2012-11-16 NOTE — Patient Instructions (Signed)
Today we updated your med list in our EPIC system...    Continue your current medications the same...    We refilled the alprazolam per request...  Today we did a follow up CXR & EKG for further evaluation of your chest discomfort...    We will contact you w/ the results when available...   We will arrange for a follow up BONE DENSITY TEST as well...  Let's get on track w/ our diet & exercise program...    The goal is to lose 1--15 lbs...  Call for any questions...  Let's plan a follow up visit in 51mo w/ FASTING blood work at that time.Marland KitchenMarland Kitchen

## 2012-11-19 ENCOUNTER — Other Ambulatory Visit: Payer: Self-pay | Admitting: Pulmonary Disease

## 2012-11-26 ENCOUNTER — Ambulatory Visit (INDEPENDENT_AMBULATORY_CARE_PROVIDER_SITE_OTHER)
Admission: RE | Admit: 2012-11-26 | Discharge: 2012-11-26 | Disposition: A | Discharge status: Home / Self Care | Payer: Medicare Other | Source: Ambulatory Visit | Attending: Pulmonary Disease | Admitting: Pulmonary Disease

## 2012-11-26 DIAGNOSIS — M8580 Other specified disorders of bone density and structure, unspecified site: Secondary | ICD-10-CM

## 2012-11-26 DIAGNOSIS — M899 Disorder of bone, unspecified: Secondary | ICD-10-CM

## 2012-12-01 ENCOUNTER — Telehealth: Payer: Self-pay | Admitting: Pulmonary Disease

## 2012-12-01 DIAGNOSIS — R208 Other disturbances of skin sensation: Secondary | ICD-10-CM

## 2012-12-01 NOTE — Telephone Encounter (Signed)
Pt aware of recs and referral placed

## 2012-12-01 NOTE — Telephone Encounter (Signed)
I called and spoke with pt. She reports her leg has this burning sensation x Sunday. Worse when lying down. It does not hurt it just burns. She is asking to see someone for this. Please advise SN thanks

## 2012-12-01 NOTE — Telephone Encounter (Signed)
Per SN--  Refer to neuro for further eval of burning sensation in her legs.  eval for neuropathy.

## 2012-12-08 DIAGNOSIS — H35359 Cystoid macular degeneration, unspecified eye: Secondary | ICD-10-CM | POA: Diagnosis not present

## 2012-12-08 DIAGNOSIS — H524 Presbyopia: Secondary | ICD-10-CM | POA: Diagnosis not present

## 2012-12-08 DIAGNOSIS — H2510 Age-related nuclear cataract, unspecified eye: Secondary | ICD-10-CM | POA: Diagnosis not present

## 2012-12-09 ENCOUNTER — Telehealth: Payer: Self-pay | Admitting: *Deleted

## 2012-12-09 MED ORDER — ESTROGENS CONJUGATED 0.625 MG PO TABS: ORAL_TABLET | ORAL | Status: DC

## 2012-12-09 NOTE — Telephone Encounter (Signed)
Premarin 0.625 mg qd OK. #30 refill x 11

## 2012-12-09 NOTE — Telephone Encounter (Signed)
You can  call her in Premarin vaginal cream to apply twice a week like she was doing before. One tube 10 refills

## 2012-12-09 NOTE — Telephone Encounter (Signed)
Left on pt voicemail rx sent for the below.

## 2012-12-09 NOTE — Telephone Encounter (Signed)
Sorry the below was incorrect pt was on premarin tablets 0.625 daily, I will sent this Rx in for pt.

## 2012-12-09 NOTE — Telephone Encounter (Signed)
Pt called c/o hot flashes was on premarin vaginal cream but stopped by choice. Pt last seen on 09/30/12, she would like to start back on lowest dose of HRT due to flashes. Please advise

## 2012-12-17 DIAGNOSIS — H26499 Other secondary cataract, unspecified eye: Secondary | ICD-10-CM | POA: Diagnosis not present

## 2012-12-17 DIAGNOSIS — H2510 Age-related nuclear cataract, unspecified eye: Secondary | ICD-10-CM | POA: Diagnosis not present

## 2012-12-18 ENCOUNTER — Telehealth: Payer: Self-pay | Admitting: Pulmonary Disease

## 2012-12-18 ENCOUNTER — Other Ambulatory Visit: Payer: Self-pay

## 2012-12-18 MED ORDER — ESTROGENS CONJUGATED 0.625 MG PO TABS: ORAL_TABLET | ORAL | Status: DC

## 2012-12-18 NOTE — Telephone Encounter (Signed)
L,momtcb x1 for pt

## 2012-12-21 NOTE — Telephone Encounter (Signed)
Pt returned call Spoke with patient who verified that her Nexium will no longer befilled thru [facility], but will now go thru [employer].  Pt does not need an rx at this time.  Last ov 11.3.14 w/ SN, follow up in 6 months. Nothing further needed at this time; will sign off.

## 2012-12-22 ENCOUNTER — Other Ambulatory Visit: Payer: Self-pay | Admitting: Pulmonary Disease

## 2012-12-22 MED ORDER — ESOMEPRAZOLE MAGNESIUM 40 MG PO CPDR: DELAYED_RELEASE_CAPSULE | ORAL | Status: DC

## 2012-12-29 DIAGNOSIS — G43719 Chronic migraine without aura, intractable, without status migrainosus: Secondary | ICD-10-CM | POA: Diagnosis not present

## 2012-12-29 DIAGNOSIS — G4440 Drug-induced headache, not elsewhere classified, not intractable: Secondary | ICD-10-CM | POA: Diagnosis not present

## 2013-01-01 ENCOUNTER — Ambulatory Visit (INDEPENDENT_AMBULATORY_CARE_PROVIDER_SITE_OTHER): Payer: Medicare Other | Admitting: Neurology

## 2013-01-01 ENCOUNTER — Encounter: Payer: Self-pay | Admitting: Neurology

## 2013-01-01 VITALS — BP 134/70 | HR 64 | Temp 97.7°F | Ht 66.5 in | Wt 203.0 lb

## 2013-01-01 DIAGNOSIS — G5712 Meralgia paresthetica, left lower limb: Secondary | ICD-10-CM | POA: Insufficient documentation

## 2013-01-01 DIAGNOSIS — G5710 Meralgia paresthetica, unspecified lower limb: Secondary | ICD-10-CM | POA: Diagnosis not present

## 2013-01-01 NOTE — Progress Notes (Signed)
[facility] Clinic Note - Initial Visit   Date: 01/01/2013    Margaret Mitchell MRN: [id number] DOB: 01-21-1940   Dear Dr Kriste Basque:  Thank you for your kind referral of Margaret Mitchell for consultation of left thigh tightness. Although her history is well known to you, please allow [name] to reiterate it for the purpose of our medical record. The patient was accompanied to the clinic by self.   History of Present Illness: Margaret Mitchell is a 72 y.o. right-handed African American female with history of IBS, headaches, anxiety, hypertension, and GERD presenting for evaluation of left thigh tightness.    In October 2014, she was travelling with her husband in [place] and they had stopped over in [place]. When she arrived in [place], she noticed a tightness over her left thigh.  Symptoms are intermittent and most noticeable with prolonged sitting and when she lays down.  Nothing alleviates her symptoms.  She tries to walk it off.  She feels as if her leg is put in a girdle.  She denies any numbness or tingling over that area. Medial and posterior thigh is unaffected. No back pain, bowel/bladder incontinence, weakness, or right leg symptoms.  She reports gaining 20lb in the past two years.  Denies wearing any tight garments around her waist.  She was previously seen at [facility] by Dr. Modesto Charon in October 2012 for acute onset of dizziness, suggestive of BPPV.  MRI/MRA performed at that time revealed no obvious stroke or vascular abnormality.  Due to sensory complaints, he also screened her for peripheral neuropathy which revealed a mildly elevated CRP and ESR. However, an EMG/NCS did not reveal any sensorimotor neuropathy.  Due to concern of possible RLS, she was given a trial ropinirole in the past, but does not recall if it worked.  She also has a history of headaches and sees Dr. Neale Burly for his.    Out-side paper records, electronic medical record, and images have been  reviewed where available and summarized as:  MRI brain wwo contrast 10/24/2010: 1. Scattered periventricular and subcortical T2 and FLAIR hyperintensities bilaterally. The finding is nonspecific but can be  seen in the setting of chronic microvascular ischemia, a demyelinating process such as multiple sclerosis, vasculitis,  complicated migraine headaches, or as the sequelae of a prior infectious or inflammatory process.  2. No acute or focal lesion to explain the patient's symptoms. No significant posterior fossa white matter disease is evident.  3. Minimal sinus disease.  MRA head 11/01/2010: 1. Mild distal small vessel disease.  2. Otherwise unremarkable normal variant MRA without significant proximal stenosis, aneurysm, or branch vessel occlusion.  MRA neck 10/31/2012: 1. 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 This could represent focal stenosis. CTA of the neck could be  utilized to further characterize this vessel given the potential for artifact in a small vessel on the MRA.  2. Otherwise unremarkable MRA of the neck.  Component     Latest Ref Rng 10/24/2010  Sed Rate     0 - 22 mm/hr 35 (H)  Vitamin B-12     211 - 911 pg/mL 849  Methylmalonic Acid, Quantitative     87 - 318 nmol/L 74 (L)  Hemoglobin A1C     4.6 - 6.5 % 5.8  CRP     <0.60 mg/dL 5.64 (H)  TSH     3.32 - 5.50 uIU/mL 1.54    Lab Results  Component Value Date   TSH 2.88  05/13/2012     Past Medical History  Diagnosis Date  . Allergy, unspecified not elsewhere classified   . IBS (irritable bowel syndrome)   . Headache(784.0)   . Pelvic adhesions   . Pelvic pain in female   . Menorrhagia   . Sinus infection   . Anxiety   . Hypertension   . Osteoporosis   . Thyroid disease     Parathyroid cyst  . Cervical dysplasia     prior to hysterectomy    Past Surgical History  Procedure Laterality Date  . Parathyroid adenoma removed    . Vesicovaginal fistula closure w/ tah    .  Tonsillectomy    . Appendectomy    . Tubal ligation  1981  . Rotator cuff repair      X2  . Foot surgery      RIGHT AND LEFT  . Colposcopy    . Oophorectomy      BSO  . Cataract extraction  2013  . Shoulder injection  10/2011  . Abdominal hysterectomy  1983    TAH/BSO for pelvic pain and adhesions     Medications:  Current Outpatient Prescriptions on File Prior to Visit  Medication Sig Dispense Refill  . ALPRAZolam (XANAX) 0.5 MG tablet Take 1 tablet (0.5 mg total) by mouth 3 (three) times daily as needed.  90 tablet  5  . amLODipine-benazepril (LOTREL) 5-20 MG per capsule Take 1 capsule by mouth daily.  90 capsule  3  . aspirin 81 MG tablet Take 81 mg by mouth daily.        . cholecalciferol (VITAMIN D) 1000 UNITS tablet Take 1,000 Units by mouth daily.      Marland Kitchen esomeprazole (NEXIUM) 40 MG capsule TAKE 1 CAPSULE BY MOUTH TWICE  A DAY 30 MINUTES BEFORE MEALS  180 capsule  3  . estrogens, conjugated, (PREMARIN) 0.625 MG tablet Take 1 tablet by mouth daily  90 tablet  3  . metoprolol succinate (TOPROL-XL) 100 MG 24 hr tablet TAKE ONE-HALF (1/2) TABLET DAILY  45 tablet  2  . Omega-3 Fatty Acids (FISH OIL) 1000 MG CAPS Take as directed        No current facility-administered medications on file prior to visit.    Allergies:  Allergies  Allergen Reactions  . Prednisone     REACTION: heart palpatations  In larger doses  . Rofecoxib     REACTION: mouth swelling (VIOXX)  . Sulfonamide Derivatives     REACTION: pt unable to remember  . Terfenadine     SELDANE    Family History: Family History  Problem Relation Age of Onset  . Diabetes Mother   . Cancer Father     LUNG  . Lung cancer Father   . Cancer Brother     LEUKEMIA  . Hypertension Maternal Grandmother   . Heart disease Maternal Grandmother   . Diabetes Maternal Grandmother   . Stroke Maternal Grandmother   . Diabetes Maternal Aunt   . Heart failure Paternal Grandmother     Social History: History   Social  History  . Marital Status: Married    Spouse Name: Charlann Noss    Number of Children: 1  . Years of Education: N/A   Occupational History  .     Social History Main Topics  . Smoking status: Never Smoker   . Smokeless tobacco: Never Used  . Alcohol Use: No  . Drug Use: No  . Sexual Activity: No   Other  Topics Concern  . Not on file   Social History Narrative   Lives with husband.   Retired from working Barrister's clerk at [employer]   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    Review of Systems:  CONSTITUTIONAL: No fevers, chills, night sweats, or weight loss.   EYES: No visual changes or eye pain ENT: No hearing changes.  No history of nose bleeds.   RESPIRATORY: No cough, wheezing and shortness of breath.   CARDIOVASCULAR: Negative for chest pain, and palpitations.   GI: Negative for abdominal discomfort, blood in stools or black stools.  No recent change in bowel habits.   GU:  No history of incontinence.   MUSCLOSKELETAL: No history of joint pain or swelling.  No myalgias.   SKIN: Negative for lesions, rash, and itching.   HEMATOLOGY/ONCOLOGY: Negative for prolonged bleeding, bruising easily, and swollen nodes.    ENDOCRINE: Negative for cold or heat intolerance, polydipsia or goiter.   PSYCH:  No depression or anxiety symptoms.   NEURO: As Above.   Vital Signs:  BP 134/70  Pulse 64  Temp(Src) 97.7 F (36.5 C)  Ht 5' 6.5" (1.689 m)  Wt 203 lb (92.08 kg)  BMI 32.28 kg/m2   Neurological Exam: MENTAL STATUS including orientation to time, place, person, recent and remote memory, attention span and concentration, language, and fund of knowledge is normal.  Speech is not dysarthric.  CRANIAL NERVES: II:  No visual field defects.  Unremarkable fundi.   III-IV-VI: Pupils equal round and reactive to light.  Normal conjugate, extra-ocular eye movements in all directions of gaze.  No nystagmus.  No ptosis V:  Normal facial sensation. VII:  Normal facial symmetry  and movements.    VIII:  Normal hearing and vestibular function.   IX-X:  Normal palatal movement.   XI:  Normal shoulder shrug and head rotation.   XII:  Normal tongue strength and range of motion, no deviation or fasciculation.  MOTOR:  No atrophy, fasciculations or abnormal movements.  No pronator drift.  Tone is normal.    Right Upper Extremity:    Left Upper Extremity:    Deltoid  5/5   Deltoid  5/5   Biceps  5/5   Biceps  5/5   Triceps  5/5   Triceps  5/5   Wrist extensors  5/5   Wrist extensors  5/5   Wrist flexors  5/5   Wrist flexors  5/5   Finger extensors  5/5   Finger extensors  5/5   Finger flexors  5/5   Finger flexors  5/5   Dorsal interossei  5/5   Dorsal interossei  5/5   Abductor pollicis  5/5   Abductor pollicis  5/5   Tone (Ashworth scale)  0  Tone (Ashworth scale)  0   Right Lower Extremity:    Left Lower Extremity:    Hip flexors  5/5   Hip flexors  5/5   Hip extensors  5/5   Hip extensors  5/5   Knee flexors  5/5   Knee flexors  5/5   Knee extensors  5/5   Knee extensors  5/5   Dorsiflexors  5/5   Dorsiflexors  5/5   Plantarflexors  5/5   Plantarflexors  5/5   Toe extensors  5/5   Toe extensors  5/5   Toe flexors  5/5   Toe flexors  5/5   Tone (Ashworth scale)  0  Tone (Ashworth scale)  0  MSRs:  Right                                                                 Left brachioradialis 2+  brachioradialis 2+  biceps 2+  biceps 2+  triceps 2+  triceps 2+  patellar 1+  patellar 1+  ankle jerk 0  ankle jerk 0  Hoffman no  Hoffman no  plantar response down  plantar response down    SENSORY:  Pin prick and temperature reduced over the left anterolateral thigh.  Otherwise, normal and symmetric perception of light touch, vibration, and proprioception.  Romberg's sign present.   COORDINATION/GAIT: Normal finger-to- nose-finger and heel-to-shin.  Intact rapid alternating movements bilaterally.  Able to rise from a chair without using arms.  Gait narrow based  and stable, stressed gait intact.  Unsteady with tandem gait   IMPRESSION: Mrs. Cata is a 72 year-old female presenting for evaluation of left thigh dysesthesias, characterized as "tight, vice-like" sensation.  Her examination is consistent with diminished sensation to pin prick and temperature over the anterolateral thigh on the left side. Her history and exam findings are most consistent with meralgia paresthetica, an entrapment of the lateral femoral cutaneous nerve as it exits below the inguinal canal. Management is conservative with NSAIDs and avoidance of repetitive trauma to this region, as well as weight loss. I have discussed the diagnosis, pathophysiology, management plan, and risks and benefits of medications, and prognosis.    She may also have a very mild length-dependent polyneuropathy based on her exam, but seems to have minimal clinical symptoms.    PLAN/RECOMMENDATIONS:  1.  Take ibuprofen 600mg  three times daily as needed  2.  Weight loss encouraged 3.  Avoid tight clothing or belts  4.  Return to clinic in 56-month   The duration of this appointment visit was 45 minutes of face-to-face time with the patient.  Greater than 50% of this time was spent in counseling, explanation of diagnosis, planning of further management, and coordination of care.   Thank you for allowing me to participate in patient's care.  If I can answer any additional questions, I would be pleased to do so.    Sincerely,    Tej Murdaugh K. Allena Katz, DO

## 2013-01-01 NOTE — Patient Instructions (Addendum)
Your left leg symptoms are due to meralgia paresthetica:   1.  Take ibuprofen 600mg  three times daily as needed  2.  Weight loss encouraged 3.  Avoid tight clothing, belts, or repetitive trauma to the left upper hip region 4.  Return to clinic in 78-month

## 2013-01-11 DIAGNOSIS — H2510 Age-related nuclear cataract, unspecified eye: Secondary | ICD-10-CM | POA: Diagnosis not present

## 2013-01-25 DIAGNOSIS — H2589 Other age-related cataract: Secondary | ICD-10-CM | POA: Diagnosis not present

## 2013-01-25 DIAGNOSIS — H269 Unspecified cataract: Secondary | ICD-10-CM | POA: Diagnosis not present

## 2013-01-25 DIAGNOSIS — H2510 Age-related nuclear cataract, unspecified eye: Secondary | ICD-10-CM | POA: Diagnosis not present

## 2013-02-08 ENCOUNTER — Ambulatory Visit (INDEPENDENT_AMBULATORY_CARE_PROVIDER_SITE_OTHER): Payer: Medicare Other | Admitting: Pulmonary Disease

## 2013-02-08 ENCOUNTER — Encounter: Payer: Self-pay | Admitting: Pulmonary Disease

## 2013-02-08 ENCOUNTER — Other Ambulatory Visit (INDEPENDENT_AMBULATORY_CARE_PROVIDER_SITE_OTHER): Payer: Medicare Other

## 2013-02-08 VITALS — BP 146/88 | HR 60 | Temp 97.8°F | Ht 66.5 in | Wt 203.8 lb

## 2013-02-08 DIAGNOSIS — R0609 Other forms of dyspnea: Secondary | ICD-10-CM

## 2013-02-08 DIAGNOSIS — F419 Anxiety disorder, unspecified: Secondary | ICD-10-CM

## 2013-02-08 DIAGNOSIS — R0789 Other chest pain: Secondary | ICD-10-CM

## 2013-02-08 DIAGNOSIS — G5712 Meralgia paresthetica, left lower limb: Secondary | ICD-10-CM

## 2013-02-08 DIAGNOSIS — E785 Hyperlipidemia, unspecified: Secondary | ICD-10-CM | POA: Diagnosis not present

## 2013-02-08 DIAGNOSIS — M949 Disorder of cartilage, unspecified: Secondary | ICD-10-CM

## 2013-02-08 DIAGNOSIS — K219 Gastro-esophageal reflux disease without esophagitis: Secondary | ICD-10-CM | POA: Diagnosis not present

## 2013-02-08 DIAGNOSIS — M8580 Other specified disorders of bone density and structure, unspecified site: Secondary | ICD-10-CM

## 2013-02-08 DIAGNOSIS — K589 Irritable bowel syndrome without diarrhea: Secondary | ICD-10-CM

## 2013-02-08 DIAGNOSIS — I10 Essential (primary) hypertension: Secondary | ICD-10-CM

## 2013-02-08 DIAGNOSIS — M1990 Unspecified osteoarthritis, unspecified site: Secondary | ICD-10-CM

## 2013-02-08 DIAGNOSIS — M899 Disorder of bone, unspecified: Secondary | ICD-10-CM

## 2013-02-08 DIAGNOSIS — F411 Generalized anxiety disorder: Secondary | ICD-10-CM | POA: Diagnosis not present

## 2013-02-08 DIAGNOSIS — R0600 Dyspnea, unspecified: Secondary | ICD-10-CM

## 2013-02-08 DIAGNOSIS — R0989 Other specified symptoms and signs involving the circulatory and respiratory systems: Secondary | ICD-10-CM

## 2013-02-08 LAB — CBC WITH DIFFERENTIAL/PLATELET
Basophils Absolute: 0 10*3/uL (ref 0.0–0.1)
Basophils Relative: 0.3 % (ref 0.0–3.0)
EOS ABS: 0.1 10*3/uL (ref 0.0–0.7)
Eosinophils Relative: 1.4 % (ref 0.0–5.0)
HCT: 39 % (ref 36.0–46.0)
Hemoglobin: 13.2 g/dL (ref 12.0–15.0)
LYMPHS PCT: 43.8 % (ref 12.0–46.0)
Lymphs Abs: 3.9 10*3/uL (ref 0.7–4.0)
MCHC: 33.8 g/dL (ref 30.0–36.0)
MCV: 91.7 fl (ref 78.0–100.0)
MONO ABS: 0.8 10*3/uL (ref 0.1–1.0)
Monocytes Relative: 9.2 % (ref 3.0–12.0)
Neutro Abs: 4 10*3/uL (ref 1.4–7.7)
Neutrophils Relative %: 45.3 % (ref 43.0–77.0)
PLATELETS: 259 10*3/uL (ref 150.0–400.0)
RBC: 4.26 Mil/uL (ref 3.87–5.11)
RDW: 12.8 % (ref 11.5–14.6)
WBC: 8.9 10*3/uL (ref 4.5–10.5)

## 2013-02-08 LAB — BASIC METABOLIC PANEL
BUN: 13 mg/dL (ref 6–23)
CHLORIDE: 104 meq/L (ref 96–112)
CO2: 28 mEq/L (ref 19–32)
CREATININE: 0.8 mg/dL (ref 0.4–1.2)
Calcium: 8.8 mg/dL (ref 8.4–10.5)
GFR: 88.45 mL/min (ref >60.00)
GLUCOSE: 90 mg/dL (ref 70–99)
Potassium: 3.9 mEq/L (ref 3.5–5.1)
Sodium: 138 mEq/L (ref 135–145)

## 2013-02-08 LAB — TSH: TSH: 2.63 u[IU]/mL (ref 0.35–5.50)

## 2013-02-08 MED ORDER — CLONAZEPAM 0.5 MG PO TABS: 0.5000 mg | ORAL_TABLET | Freq: Two times a day (BID) | ORAL | Status: DC | PRN

## 2013-02-08 NOTE — Patient Instructions (Signed)
Today we updated your med list in our EPIC system...    Continue your current medications the same...  Today we are checking a breathing test and an EKG... We will do some additional blood work & set up up for a special CT scan of the chest to rule out a blood clot problem...  In the interim I want to start you on a medication to relax the chest wall muscles & allow for a deeper more satisfying breath...    Start the new KLONOPIN 0.5mg  tabs- take one tab twice daily (if it is too sedating you can cut the dose down to 1/2 tab)...    Try not to take the Xanax while you are on the new med...  Call for any questions...  Let's plan a follow up visit in 3 weeks.Marland KitchenMarland Kitchen

## 2013-02-10 ENCOUNTER — Ambulatory Visit (INDEPENDENT_AMBULATORY_CARE_PROVIDER_SITE_OTHER)
Admission: RE | Admit: 2013-02-10 | Discharge: 2013-02-10 | Disposition: A | Discharge status: Home / Self Care | Payer: Medicare Other | Source: Ambulatory Visit | Attending: Pulmonary Disease | Admitting: Pulmonary Disease

## 2013-02-10 DIAGNOSIS — R0600 Dyspnea, unspecified: Secondary | ICD-10-CM

## 2013-02-10 DIAGNOSIS — R0609 Other forms of dyspnea: Secondary | ICD-10-CM

## 2013-02-10 DIAGNOSIS — R0602 Shortness of breath: Secondary | ICD-10-CM | POA: Diagnosis not present

## 2013-02-10 DIAGNOSIS — R0789 Other chest pain: Secondary | ICD-10-CM

## 2013-02-10 DIAGNOSIS — R0989 Other specified symptoms and signs involving the circulatory and respiratory systems: Secondary | ICD-10-CM | POA: Diagnosis not present

## 2013-02-10 DIAGNOSIS — R079 Chest pain, unspecified: Secondary | ICD-10-CM | POA: Diagnosis not present

## 2013-02-10 MED ORDER — IOHEXOL 300 MG/ML  SOLN
100.0000 mL | Freq: Once | INTRAMUSCULAR | Status: AC | PRN
  Administered 2013-02-10: 100 mL via INTRAVENOUS

## 2013-02-11 ENCOUNTER — Other Ambulatory Visit: Payer: Self-pay | Admitting: Pulmonary Disease

## 2013-02-11 DIAGNOSIS — E041 Nontoxic single thyroid nodule: Secondary | ICD-10-CM

## 2013-02-16 ENCOUNTER — Ambulatory Visit (HOSPITAL_COMMUNITY)
Admission: RE | Admit: 2013-02-16 | Discharge: 2013-02-16 | Disposition: A | Discharge status: Home / Self Care | Payer: Medicare Other | Source: Ambulatory Visit | Attending: Pulmonary Disease | Admitting: Pulmonary Disease

## 2013-02-16 DIAGNOSIS — R599 Enlarged lymph nodes, unspecified: Secondary | ICD-10-CM | POA: Insufficient documentation

## 2013-02-16 DIAGNOSIS — E041 Nontoxic single thyroid nodule: Secondary | ICD-10-CM | POA: Insufficient documentation

## 2013-02-16 DIAGNOSIS — E042 Nontoxic multinodular goiter: Secondary | ICD-10-CM | POA: Insufficient documentation

## 2013-02-23 ENCOUNTER — Encounter: Payer: Self-pay | Admitting: Pulmonary Disease

## 2013-03-02 ENCOUNTER — Encounter: Payer: Self-pay | Admitting: Pulmonary Disease

## 2013-03-02 NOTE — Progress Notes (Signed)
Subjective:    Patient ID: Margaret Mitchell, female    DOB: [birth year], 73 y.o.   MRN: [id number]  HPI 73 y/o BF here for a follow up visit... she has mult med problems as noted below... she gets her meds from [facility]... she is the daughter of Margaret Mitchell.  ~  November 12, 2011:  82mo ROV & [name]'s CC is persistent HAs> she saw TP 8/13 w/ dizziness felt to be BPPV & treated w/ Meclizine, she had prev eval by Neuro w/ MRI etc;  We discussed need to refer to HA management clinic...    Hx HBP controlled on Lotrel w/ BP= 112/60;  Chol looks good fish oil but wt is up 11# to 194# & we reviewed diet, exercise & wt reduction strategies;  She developed worsening right shoulder rotator cuff tendonitis per Margaret Mitchell & shot helped...    We reviewed prob list, meds, xrays and labs> see below for updates >> OK Flu vaccine today & Rx written for shingles shot  ~  December 30, 2011:  6wk ROV & [name] returns for an add-on visit to discuss questions she has over her eval by Margaret Mitchell; she saw him 11/21/11 for eval of HAs & his note is reviewed> hx diagnosed chr migrains, supraorbital neuralgia, occipital neuralgia, cervicalgia, myalgia, spasm of muscles, & prob medication overuse headaches;  He ordered EMGs (we don't have this report) & told her she had neuropathy (apparently in her right leg) & prescribed GABAPENTIN 300mg  Qhs & extra 100mg  during the day prn;  She is receiving nerve block shots in her neck & reports that these are helping;  She was informed that some people w/ neuropathy have DM & Thyroid dis- I reassured her that she does NOT have either of these conditions & that Margaret Mitchell would pursue the etiology w/ MRI of spine if & when he felt it was indicated...    HBP> on MetopER50, Lotrel5-20; BP= 110/72 & she denies recent HA, CP, palpit, SOB, edema, etc...    Chol> on FishOil & diet; last FLP 4/13 showed TChol 189, TG 117, HDL 83, LDL 82    GI- GERD, IBS, Constip> on Nexium40prn; she denies abd pain,  n/v, c/d, blood seen...    DJD> on OTC analgesics as needed; followed by Margaret Mitchell (see below)...    Osteopenia> on calcium, MVI, VitD; last BMD improved & she's been on a bisphos drug holiday...    HAs> see above for recent eval by Margaret Mitchell- on Gabapentin300Qhs & occipital neuralgia injections...    Neuropathy> she had prev neuropathy work up by [employer] w/ evid of chronic right L5-S1 radiculopathy...    Anxiety> on Alprazolam that also helps her sleep... We reviewed prob list, meds, xrays and labs> see below for updates >>   ~  May 13, 2012:  95mo ROV & [name] is doing satis, only c/o constip & we reviewed Miralax, Senakot-S etc;  We reviewed the following medical problems during today's office visit >>     HBP> on MetopER50, Lotrel5-20; BP= 120/70 & she denies recent HA, CP, palpit, SOB, edema, etc...    Chol> on FishOil & diet; FLP 4/14 shows TChol 188, TG 125, HDL 67, LDL 97    GI- GERD, IBS, Constip> on Nexium40prn; she denies abd pain, n/v, c/d, blood seen...    DJD> on OTC analgesics as needed; followed by Margaret Mitchell (see below)...    Osteopenia> on calcium, MVI, VitD1000; last BMD improved & she's been on a bisphos drug  holiday...    HAs> on Gabapentin300Qhs & occipital neuralgia injections; followed by Margaret Mitchell- last OV 11/13 reviewed     Neuropathy> s/p neuropathy work up by [employer] w/ evid of chronic right L5-S1 radiculopathy; on Neurontin300Qhs & 100mg Prn days if needed but she feels this is causing depression stating that she thinks of dying all day long; Rec to stop Neurontin & consider antidepressant but she wants to wait until her f/u appt w/ Neuro, Margaret Mitchell...    ?RLS> she uses Requip at bedtime only if needed for RLS...    Anxiety> on Alprazolam0.5mg  prn which helps her stress she says... We reviewed prob list, meds, xrays and labs> see below for updates >>  LABS 4/14:  FLP- at goals on diet+FishOil;  Chems- wnl;  CBC- wnl;  TSH=2.88;  VitD=44 on 1000u daily...  ~  November 16, 2012:  16mo ROV & [name] notices some sharp, atypical CP intermittently; also c/o constip & rec to take Miralax & Senakot-S...    HBP treated with MetopER50, Lotrel5-20; BP= 132/82 and she denies angina, palpit, SOB, edema, etc...    Chol controlled on diet & Fish oil- last FLP 4/14 was within parameters...    She has hx GERD on Nexium40 and IBS-c asked to take Miralax 7 Senakot-S; her last colon was 2007 by Margaret Mitchell & neg x melanosis...    GYN- Margaret Mitchell 9/14> post menopausal atrophic vaginitis, osteoporosis, s/p TAH/BSO, she was on HRT for yrs but stopped on her own 2014 & doing satis...     She has DJD followed by Margaret Mitchell, Osteopenia 7 is due for f/u BMD, Hx HAs folloowed by Margaret Mitchell...     Anxiety w/ elderly mother in [place]; on Alpraz prn... We reviewed prob list, meds, xrays and labs> see below for updates >> she received the 2014 flu shot in Sept; requests refill Rx for Rebeca Allegra today...  CXR 11/14 showed norm heart size, clear lungs w/ mild bibasilar scarring, NAD.Marland KitchenMarland Kitchen EKG 11/14 showed SBrady,rate58, wnl, NAD... BMD 11/14 shows TScores -1.1 in Spine, and -0.1 in right [facility];  Stable & does not need Fosamax; continue Ca, MVI, VitD, & wt bearing exercise...  ~  February 08, 2013:  41mo ROV & add-on appt for SOB> state she awoke 01/24/13 w/ indigestion, felt like someone sitting on her chest, comes & goes, notes SOB on stairs, otherw ?ppt/ lasts 33min/ relief spont, can't get a deep breath, doesn't feel satisfied breathing, sigh helps; notes that she's under lots of stress, Xanax helps, notes legs cramp at night... She is concerned about these symptoms and not reassured by my assessment> therefore we decided to pursue work up w/ Labs, EKG, PF, CT AngioChest, & Rx w/ Klonopin 0.5mg  Bid w/ short term f/u in 3 weeks to assess...  Note that BP remains controlled on Metoprolol & Lotrel ~140/80 range, weight is up 5# to 204# today & we reviewed diet, exercise & wt reduction strategies...  CXR 11/14 showed  norm heart size, clear lungs w/ mild bibasilar scarring, NAD.Marland KitchenMarland Kitchen  EKG 1/15 showed NSR,rate65, wnl, NAD...  LABS 1/15:  Chems- wnl;  CBC- wnl;  TSH=2.63  PFT 1/15 showed FVC=2.63 (97%), FEV1=2.06 (101%), %1sec=79, mid-flows=93%... (PFTs wnl)  CT Angio no PE/ infiltrates/ effusions/ mass/ adenopathy, norm hrt size; incidental- 19mm cyst left kidney, 19mm low density nodule left lobe of thyroid & 6mm nodule in isthmus=> they rec Thyroid Sonar (see report)...          Problem List:  Health Maintenance - she takes ASA 81mg /d.Marland KitchenMarland Kitchen  her GYN is DrGottsegen- on Premarin 0.625mg /d... Mammograms [facility] yearly... BMD here 5/11 w/ normal TScores & off Fosamax now for drug holiday. ~  Immunizations: she gets yearly Flu vaccines in the fall... had Pneumovax 2003 at age 72>  repeat PNEUMOVAX given 4/11 age 10.  ALLERGY (ICD-995.3) - Rx w/ Claritin, Saline, Flonase w/ prev ENT eval by DrShoemaker. ~  10/11:  seen w/ sinusitis- Rx Augmentin, Depo, Mucinex, Saline...  HYPERTENSION (ICD-401.9) - on ASA 81mg /d, TOPROL XL 100mg - 1/2 tab in AM, & LOTREL 5/20 Daily in PM...  ~  CXR 4/11 was clear, WNL...  ~  2/12:  BP= 136/88 and similiar at home per pt in the 130/80 range... denies visual changes, CP, palipit, dizziness, syncope, dyspnea, edema, etc... she exercises by walking... ~  4/12:  BP= 114/68 and continues to feel well overall... ~  8/12:  BP= 120/70 sitting/ standing (140 supine) & we decided to decr the Metoprolol to 1/2 tab in AM & take the Lotrel in PM. ~  10/12:  BP= 120/74 w/o postural change & she denies CP, palpit, SOB, edema, etc... ~  CXR 10/12 showed normal heart size, clear lungs, NAD... ~  4/13:  BP= 128/78 & she denies HA, CP, palpit, SOB, edema, etc... ~  4/14:  on MetopER50, Lotrel5-20; BP= 120/70 & she denies recent HA, CP, palpit, SOB, edema, etc. ~  11/14: HBP treated with MetopER50, Lotrel5-20; BP= 132/82 and she denies angina, palpit, SOB, edema, etc...  CXR 11/14 showed norm heart  size, clear lungs w/ mild bibasilar scarring, NAD.Marland KitchenMarland Kitchen  EKG 11/14 showed SBrady,rate58, wnl, NAD... ~  1/15: still on MetopER50, Lotrel5-20; BP= 140/80 but she presents w/ SOB, DOE, chest discomfort & anxiety; she is concerned and difficult to reassure so we decided to work up further> Rx w/ KLONOPIN 0.5Bid & f/u in 3wks to reassess...  LABS 1/15:  Chems- wnl;  CBC- wnl;  TSH=2.63  EKG 1/15 showed NSR,rate65, wnl, NAD...  PFT 1/15 showed FVC=2.63 (97%), FEV1=2.06 (101%), %1sec=79, mid-flows=93%... (PFTs wnl)  CT Angio no PE/ infiltrates/ effusions/ mass/ adenopathy, norm hrt size; incidental- 19mm cyst left kidney, 19mm low density nodule left lobe of thyroid & 6mm nodule in isthmus=> they rec Thyroid Sonar (see report)...  HYPERCHOLESTEROLEMIA, BORDERLINE (ICD-272.4) - on diet alone + Fish Oil... ~  FLP 4/10 (wt=203#) showed TChol 209, TG 116, HDL 67, LDL 105 ~  FLP 4/11 (wt=210#) showed TChol 175, TG 138, HDL 61, LDL 87 ~  FLP 4/12 (wt=211#) showed TChol 203, TG 169, HDL 65, LDL 105 ~  FLP 4/13 (wt=183#) showed TChol 189, TG 117, HDL 83, LDL 82... Great job w/ wt reduction! ~  FLP 4/14 on diet alone (wt=197#) showed TChol 188, TG 125, HDL 67, LDL 97  GERD (ICD-530.81) - on NEXIUM 40mg ... ~  last EGD was 4/07 by Margaret Mitchell showing chr GERD and gastric polyps...  ~  AbdSonar 2007 w/ no gallstones, negative sonar...  ~  HIDA Scan 5/09 was norm w/ eject fract 76% & patent ducts...  IRRITABLE BOWEL SYNDROME (ICD-564.1) >>  ~  last colonoscopy 9/07 by Margaret Mitchell showed melanosis only, no other abn...  CONSTIPATION (ICD-564.00) - on Metamucil etc... Asked to start regimen w/ Miralax, Senakot-S daily...  DEGENERATIVE JOINT DISEASE (ICD-715.90) - she uses Arthritis Tylenol as needed... ~  10/11:  she reports followed by Margaret Mitchell w/ cortisone shots in neck planned- currently taking "mustard" for "body cramps" but it upset her stomach; asked to try tonic water for nocturnal  leg cramps, and incr  [id number] Tid Prn muscle spasm. ~  4/12:  She states she's had XRays & 2 MRIs from Margaret Mitchell w/ "alot of arthritis & degenerating discs"; given 2 shots- helped short term;  She stopped Soma & just uses Alpraz Prn... ~  9/13: c/o 47mo right shoulder pain & Dx w/ tendonitis per Margaret Mitchell- shot given...  OSTEOPOROSIS (ICD-733.00) - on Calcium and Vits w/ 1000 VitD... off prev fosamax w/ norm BMD... ~  BMD 4/09 much improved w/ TScores -0.2 to -0.7.Marland KitchenMarland Kitchen Fosamax decr to [employer]. ~  Vit D level 4/09 = 32... rec> Vit D 1000 u daily... ~  Vit D level 4/10 = 46... rec> continue same. ~  5/11:  BMD here showed TScores -0.7 in Spine, and -0.5 in left FemNeck... rec> stop Fosamax for drug holiday. ~  BMD 11/14 shows TScores -1.1 in Spine, and -0.1 in right [facility];  Stable & does not need Fosamax; continue Ca, MVI, VitD, & wt bearing exercise...  HEADACHE (ICD-784.0) >> seen by DrWong w/ mult symptoms including HA, Dizzy, ?RLS, ?Neuropathy.>> ~  CT Head 8/12 showed chr microvasc dis, no acute findings... ~  MRI 10/12 showed sm vessel dis, no acute abnormalities... ~  s/p neuropathy work up by [employer] w/ evid of chronic right L5-S1 radiculopathy; She saw Margaret Mitchell 11/13- Rx Neurontin300Qhs & 100mg Prn days if needed, but she feels this is causing depression stating that she thinks of dying all day long; Rec to stop Neurontin & consider antidepressant but she wants to wait until her f/u appt w/ Neuro, Margaret Mitchell...  DIZZINESS >> Eval 10/12 by DrWong-Neurology w/ prob BPPV & he suggested vestib rehab... ~  MRI/ MRA showed sm vessel dis, otherw no acute changes, prob hypoplastic right vertebral art...  ?PERIPHERAL NEUROPATHY >> ?Etiology w/ eval from DrWong 10/12 including PN Labs- all essentially wnl x sed=35/ crp=2.37... ~  EMG/NCV done in [place] due to her [employer] insurance ==> evid for chr right L5-S1 radiculopathy ~  DrWong also thought she had RLS & gave her a Requip trial=> & improved & she uses this just  prn...  ANXIETY (ICD-300.00) - on ALPRAZOLAM 0.5mg  as needed... her husb had an MI, cares for elderly mother.  S/P Shingles >> she had an outbreak on the right side of her neck 3/13 & treated by TP w/ Valtrex and resolved...  ~  4/14:  S/p shingles vaccine given at Pharmacy...   Past Surgical History  Procedure Laterality Date  . Parathyroid adenoma removed    . Vesicovaginal fistula closure w/ tah    . Tonsillectomy    . Appendectomy    . Tubal ligation  1981  . Rotator cuff repair      X2  . Foot surgery      RIGHT AND LEFT  . Colposcopy    . Oophorectomy      BSO  . Cataract extraction  2013  . Shoulder injection  10/2011  . Abdominal hysterectomy  1983    TAH/BSO for pelvic pain and adhesions    Outpatient Encounter Prescriptions as of 02/08/2013  Medication Sig  . ALPRAZolam (XANAX) 0.5 MG tablet Take 1 tablet (0.5 mg total) by mouth 3 (three) times daily as needed.  Marland Kitchen amLODipine-benazepril (LOTREL) 5-20 MG per capsule Take 1 capsule by mouth daily.  Marland Kitchen aspirin 81 MG tablet Take 81 mg by mouth daily.    . cholecalciferol (VITAMIN D) 1000 UNITS tablet Take 1,000 Units by mouth daily.  Marland Kitchen esomeprazole (NEXIUM) 40 MG  capsule TAKE 1 CAPSULE BY MOUTH TWICE  A DAY 30 MINUTES BEFORE MEALS  . estrogens, conjugated, (PREMARIN) 0.625 MG tablet Take 1 tablet by mouth daily  . metoprolol succinate (TOPROL-XL) 100 MG 24 hr tablet TAKE ONE-HALF (1/2) TABLET DAILY  . Omega-3 Fatty Acids (FISH OIL) 1000 MG CAPS Take as directed   . clonazePAM (KLONOPIN) 0.5 MG tablet Take 1 tablet (0.5 mg total) by mouth 2 (two) times daily as needed for anxiety.    Allergies  Allergen Reactions  . Prednisone     REACTION: heart palpatations  In larger doses  . Rofecoxib     REACTION: mouth swelling (VIOXX)  . Sulfonamide Derivatives     REACTION: pt unable to remember  . Terfenadine     SELDANE    Current Medications, Allergies, Past Medical History, Past Surgical History, Family History, and  Social History were reviewed in [employer] record.    Review of Systems        See HPI - all other systems neg except as noted... The patient complains of dyspnea on exertion.  The patient denies anorexia, fever, weight loss, weight gain, vision loss, decreased hearing, hoarseness, chest pain, syncope, peripheral edema, prolonged cough, headaches, hemoptysis, abdominal pain, melena, hematochezia, severe indigestion/heartburn, hematuria, incontinence, muscle weakness, suspicious skin lesions, transient blindness, difficulty walking, depression, unusual weight change, abnormal bleeding, enlarged lymph nodes, and angioedema.     Objective:   Physical Exam      WD, WN, 72 y/o BF in NAD... GENERAL:  Alert & oriented; pleasant & cooperative... HEENT:  [place]/AT, EOM-wnl, PERRLA, no nystagmus, EACs-clear, TMs-wnl, NOSE- sl red w/ drainage, THROAT-clear & wnl. NECK:  Supple w/ fairROM; no JVD; normal carotid impulses w/o bruits; no thyromegaly or nodules palpated; no lymphadenopathy. CHEST:  Clear to P & A; without wheezes/ rales/ or rhonchi. HEART:  Regular Rhythm; without murmurs/ rubs/ or gallops. ABDOMEN:  Soft & nontender; normal bowel sounds; no organomegaly or masses detected. EXT: without deformities, mild arthritic changes; no varicose veins/ +venous insuffic/ no edema. NEURO:  CN's intact; motor testing normal; sensory testing ?diminished peripherally; ?balance off but no specific gait abn noted. Decreased ankle jerk reflexes... DERM:  No lesions noted; no rash etc..  RADIOLOGY DATA:  Reviewed in the EPIC EMR & discussed w/ the patient...  LABORATORY DATA:  Reviewed in the EPIC EMR & discussed w/ the patient...   Assessment & Plan:    Dyspnea & chest discomfort> hx is c/w anxiety but she was not reassured; we did Labs, PF, EKG, CT Angio- all wnl (incidental renal cyst & thyroid nodules)... Rx w/ KLONOPIN 0.5mg Bid & f/u 3 weeks.   HBP>  Controlled on BBlocker,  ACE/CCB; continue the Metoprolol at 1/2 of the 100mg  tab in AM, & the Lotrel PM dosing; she will monitor her BP.  CHOL>  FLP is close to goal values on the diet & exercise program, no meds other than Fish Oil; continue same...  GERD>  Controlled on Nexium...  DJD>  Followed by Margaret Mitchell, eval as above & she uses arthritis tylenol alone (doesn't want stronger pain meds)...  Osteopenia>  BMD 11/14 was WNL & she is on a bisphos drug holiday......  Headaches>  Prev on Neurontin per Margaret Mitchell & HAs improved but she is concerned this is making her depressed=> Rec to stop the Gabapentin & better off this med.  Dizziness/ HA>  ?BPPV & we reviewed Epley maneuver w/ her; she is already on ASA daily w/ her  known sm vessel dis; she is reassured by the prev Neurology eval from DrWong...  ?Peripheral Neuropathy/ ?RLS>  She hadwork up from [employer] given trial of Requip...  Anxiety>  She is under a mod amt of stress & uses Alpraz prn...   Patient's Medications  New Prescriptions   CLONAZEPAM (KLONOPIN) 0.5 MG TABLET    Take 1 tablet (0.5 mg total) by mouth 2 (two) times daily as needed for anxiety.  Previous Medications   ALPRAZOLAM (XANAX) 0.5 MG TABLET    Take 1 tablet (0.5 mg total) by mouth 3 (three) times daily as needed.   AMLODIPINE-BENAZEPRIL (LOTREL) 5-20 MG PER CAPSULE    Take 1 capsule by mouth daily.   ASPIRIN 81 MG TABLET    Take 81 mg by mouth daily.     CHOLECALCIFEROL (VITAMIN D) 1000 UNITS TABLET    Take 1,000 Units by mouth daily.   ESOMEPRAZOLE (NEXIUM) 40 MG CAPSULE    TAKE 1 CAPSULE BY MOUTH TWICE  A DAY 30 MINUTES BEFORE MEALS   ESTROGENS, CONJUGATED, (PREMARIN) 0.625 MG TABLET    Take 1 tablet by mouth daily   METOPROLOL SUCCINATE (TOPROL-XL) 100 MG 24 HR TABLET    TAKE ONE-HALF (1/2) TABLET DAILY   OMEGA-3 FATTY ACIDS (FISH OIL) 1000 MG CAPS    Take as directed   Modified Medications   No medications on file  Discontinued Medications   No medications on file

## 2013-03-04 ENCOUNTER — Encounter: Payer: Self-pay | Admitting: Pulmonary Disease

## 2013-03-04 ENCOUNTER — Ambulatory Visit (INDEPENDENT_AMBULATORY_CARE_PROVIDER_SITE_OTHER): Payer: Medicare Other | Admitting: Pulmonary Disease

## 2013-03-04 VITALS — BP 128/80 | HR 63 | Temp 96.7°F | Ht 66.5 in | Wt 204.0 lb

## 2013-03-04 DIAGNOSIS — I10 Essential (primary) hypertension: Secondary | ICD-10-CM | POA: Diagnosis not present

## 2013-03-04 DIAGNOSIS — K219 Gastro-esophageal reflux disease without esophagitis: Secondary | ICD-10-CM

## 2013-03-04 DIAGNOSIS — K589 Irritable bowel syndrome without diarrhea: Secondary | ICD-10-CM

## 2013-03-04 DIAGNOSIS — M949 Disorder of cartilage, unspecified: Secondary | ICD-10-CM

## 2013-03-04 DIAGNOSIS — R0609 Other forms of dyspnea: Secondary | ICD-10-CM | POA: Diagnosis not present

## 2013-03-04 DIAGNOSIS — E785 Hyperlipidemia, unspecified: Secondary | ICD-10-CM

## 2013-03-04 DIAGNOSIS — R0989 Other specified symptoms and signs involving the circulatory and respiratory systems: Secondary | ICD-10-CM

## 2013-03-04 DIAGNOSIS — M8580 Other specified disorders of bone density and structure, unspecified site: Secondary | ICD-10-CM

## 2013-03-04 DIAGNOSIS — F419 Anxiety disorder, unspecified: Secondary | ICD-10-CM

## 2013-03-04 DIAGNOSIS — E042 Nontoxic multinodular goiter: Secondary | ICD-10-CM

## 2013-03-04 DIAGNOSIS — M1990 Unspecified osteoarthritis, unspecified site: Secondary | ICD-10-CM

## 2013-03-04 DIAGNOSIS — R0600 Dyspnea, unspecified: Secondary | ICD-10-CM | POA: Insufficient documentation

## 2013-03-04 DIAGNOSIS — M899 Disorder of bone, unspecified: Secondary | ICD-10-CM

## 2013-03-04 NOTE — Progress Notes (Signed)
Subjective:    Patient ID: Margaret Mitchell, female    DOB: 31-Jan-1940, 73 y.o.   MRN: [id number]  HPI 73 y/o BF here for a follow up visit... she has mult med problems as noted below... she gets her meds from [facility]... she is the daughter of [name].  ~  November 12, 2011:  264mo ROV & Margaret Mitchell's CC is persistent HAs> she saw TP 8/13 w/ dizziness felt to be BPPV & treated w/ Meclizine, she had prev eval by Neuro w/ MRI etc;  We discussed need to refer to HA management clinic...    Hx HBP controlled on Lotrel w/ BP= 112/60;  Chol looks good fish oil but wt is up 11# to 194# & we reviewed diet, exercise & wt reduction strategies;  She developed worsening right shoulder rotator cuff tendonitis per DrWainer & shot helped...    We reviewed prob list, meds, xrays and labs> see below for updates >> OK Flu vaccine today & Rx written for shingles shot  ~  December 30, 2011:  6wk ROV & Margaret Mitchell returns for an add-on visit to discuss questions she has over her eval by DrFreeman; she saw him 11/21/11 for eval of HAs & his note is reviewed> hx diagnosed chr migrains, supraorbital neuralgia, occipital neuralgia, cervicalgia, myalgia, spasm of muscles, & prob medication overuse headaches;  He ordered EMGs (we don't have this report) & told her she had neuropathy (apparently in her right leg) & prescribed GABAPENTIN 300mg  Qhs & extra 100mg  during the day prn;  She is receiving nerve block shots in her neck & reports that these are helping;  She was informed that some people w/ neuropathy have DM & Thyroid dis- I reassured her that she does NOT have either of these conditions & that DrFreeman would pursue the etiology w/ MRI of spine if & when he felt it was indicated...    HBP> on MetopER50, Lotrel5-20; BP= 110/72 & she denies recent HA, CP, palpit, SOB, edema, etc...    Chol> on FishOil & diet; last FLP 4/13 showed TChol 189, TG 117, HDL 83, LDL 82    GI- GERD, IBS, Constip> on Nexium40prn; she denies abd pain,  n/v, c/d, blood seen...    DJD> on OTC analgesics as needed; followed by DrWainer (see below)...    Osteopenia> on calcium, MVI, VitD; last BMD improved & she's been on a bisphos drug holiday...    HAs> see above for recent eval by DrFreeman- on Gabapentin300Qhs & occipital neuralgia injections...    Neuropathy> she had prev neuropathy work up by [employer] w/ evid of chronic right L5-S1 radiculopathy...    Anxiety> on Alprazolam that also helps her sleep... We reviewed prob list, meds, xrays and labs> see below for updates >>   ~  May 13, 2012:  64mo ROV & Margaret Mitchell is doing satis, only c/o constip & we reviewed Miralax, Senakot-S etc;  We reviewed the following medical problems during today's office visit >>     HBP> on MetopER50, Lotrel5-20; BP= 120/70 & she denies recent HA, CP, palpit, SOB, edema, etc...    Chol> on FishOil & diet; FLP 4/14 shows TChol 188, TG 125, HDL 67, LDL 97    GI- GERD, IBS, Constip> on Nexium40prn; she denies abd pain, n/v, c/d, blood seen...    DJD> on OTC analgesics as needed; followed by DrWainer (see below)...    Osteopenia> on calcium, MVI, VitD1000; last BMD improved & she's been on a bisphos drug  holiday...    HAs> on Gabapentin300Qhs & occipital neuralgia injections; followed by DrFreeman- last OV 11/13 reviewed     Neuropathy> s/p neuropathy work up by [employer] w/ evid of chronic right L5-S1 radiculopathy; on Neurontin300Qhs & 100mg Prn days if needed but she feels this is causing depression stating that she thinks of dying all day long; Rec to stop Neurontin & consider antidepressant but she wants to wait until her f/u appt w/ Neuro, DrFreeman...    ?RLS> she uses Requip at bedtime only if needed for RLS...    Anxiety> on Alprazolam0.5mg  prn which helps her stress she says... We reviewed prob list, meds, xrays and labs> see below for updates >>  LABS 4/14:  FLP- at goals on diet+FishOil;  Chems- wnl;  CBC- wnl;  TSH=2.88;  VitD=44 on 1000u daily...  ~  November 16, 2012:  31mo ROV & Margaret Mitchell notices some sharp, atypical CP intermittently; also c/o constip & rec to take Miralax & Senakot-S...    HBP treated with MetopER50, Lotrel5-20; BP= 132/82 and she denies angina, palpit, SOB, edema, etc...    Chol controlled on diet & Fish oil- last FLP 4/14 was within parameters...    She has hx GERD on Nexium40 and IBS-c asked to take Miralax 7 Senakot-S; her last colon was 2007 by DrPatterson & neg x melanosis...    GYN- DrFontaine 9/14> post menopausal atrophic vaginitis, osteoporosis, s/p TAH/BSO, she was on HRT for yrs but stopped on her own 2014 & doing satis...     She has DJD followed by DrWainer, Osteopenia 7 is due for f/u BMD, Hx HAs folloowed by DrFreeman...     Anxiety w/ elderly mother in [place]; on Alpraz prn... We reviewed prob list, meds, xrays and labs> see below for updates >> she received the 2014 flu shot in Sept; requests refill Rx for Rebeca Allegra today...  CXR 11/14 showed norm heart size, clear lungs w/ mild bibasilar scarring, NAD.Marland KitchenMarland Kitchen EKG 11/14 showed SBrady,rate58, wnl, NAD... BMD 11/14 shows TScores -1.1 in Spine, and -0.1 in right [facility];  Stable & does not need Fosamax; continue Ca, MVI, VitD, & wt bearing exercise...  ~  February 08, 2013:  5mo ROV & add-on appt for SOB> state she awoke 01/24/13 w/ indigestion, felt like someone sitting on her chest, comes & goes, notes SOB on stairs, otherw ?ppt/ lasts 22min/ relief spont, can't get a deep breath, doesn't feel satisfied breathing, sigh helps; notes that she's under lots of stress, Xanax helps, notes legs cramp at night... She is concerned about these symptoms and not reassured by my assessment> therefore we decided to pursue work up w/ Labs, EKG, PF, CT AngioChest, & Rx w/ Klonopin 0.5mg  Bid w/ short term f/u in 3 weeks to assess...  Note that BP remains controlled on Metoprolol & Lotrel ~140/80 range, weight is up 5# to 204# today & we reviewed diet, exercise & wt reduction strategies...  CXR 11/14 showed  norm heart size, clear lungs w/ mild bibasilar scarring, NAD.Marland KitchenMarland Kitchen  EKG 1/15 showed NSR,rate65, wnl, NAD...  LABS 1/15:  Chems- wnl;  CBC- wnl;  TSH=2.63  PFT 1/15 showed FVC=2.63 (97%), FEV1=2.06 (101%), %1sec=79, mid-flows=93%... (PFTs wnl)  CT Angio no PE/ infiltrates/ effusions/ mass/ adenopathy, norm hrt size; incidental- 19mm cyst left kidney, 19mm low density nodule left lobe of thyroid & 6mm nodule in isthmus=> they rec Thyroid Sonar (see report)...  ~  March 04, 2013:  3wk ROV & Margaret Mitchell reports that she is 75% better having started  the Klonopin 0.5mg - 1/2 in AM & 1 at bedtime; still notes some "fluttering" in the afternoon & feels SOB at times; we reviewed her prev work up- she should be very reassured by these neg/ normal studies;  Rec to increase the Klonopin to 1/2 in AM, 1/2 in PM, & ok to take 1 in the eve...           Problem List:  Health Maintenance - she takes ASA 81mg /d... her GYN is DrGottsegen- on Premarin 0.625mg /d... Mammograms at [facility] yearly... BMD here 5/11 w/ normal TScores & off Fosamax now for drug holiday. ~  Immunizations: she gets yearly Flu vaccines in the fall... had Pneumovax 2003 at age 89>  repeat PNEUMOVAX given 4/11 age 76.  ALLERGY (ICD-995.3) - Rx w/ Claritin, Saline, Flonase w/ prev ENT eval by DrShoemaker. ~  10/11:  seen w/ sinusitis- Rx Augmentin, Depo, Mucinex, Saline...  HYPERTENSION (ICD-401.9) - on ASA 81mg /d, TOPROL XL 100mg - 1/2 tab in AM, & LOTREL 5/20 Daily in PM...  ~  CXR 4/11 was clear, WNL...  ~  2/12:  BP= 136/88 and similiar at home per pt in the 130/80 range... denies visual changes, CP, palipit, dizziness, syncope, dyspnea, edema, etc... she exercises by walking... ~  4/12:  BP= 114/68 and continues to feel well overall... ~  8/12:  BP= 120/70 sitting/ standing (140 supine) & we decided to decr the Metoprolol to 1/2 tab in AM & take the Lotrel in PM. ~  10/12:  BP= 120/74 w/o postural change & she denies CP, palpit, SOB, edema,  etc... ~  CXR 10/12 showed normal heart size, clear lungs, NAD... ~  4/13:  BP= 128/78 & she denies HA, CP, palpit, SOB, edema, etc... ~  4/14:  on MetopER50, Lotrel5-20; BP= 120/70 & she denies recent HA, CP, palpit, SOB, edema, etc. ~  11/14: HBP treated with MetopER50, Lotrel5-20; BP= 132/82 and she denies angina, palpit, SOB, edema, etc... ~  CXR 11/14 showed norm heart size, clear lungs w/ mild bibasilar scarring, NAD... ~  EKG 11/14 showed SBrady,rate58, wnl, NAD... ~  1/15: still on MetopER50, Lotrel5-20; BP= 140/80 but she presents w/ SOB, DOE, chest discomfort & anxiety; she is concerned and difficult to reassure so we decided to work up further> Rx w/ KLONOPIN 0.5Bid & f/u in 3wks to reassess...  LABS 1/15:  Chems- wnl;  CBC- wnl;  TSH=2.63  EKG 1/15 showed NSR,rate65, wnl, NAD...  PFT 1/15 showed FVC=2.63 (97%), FEV1=2.06 (101%), %1sec=79, mid-flows=93%... (PFTs wnl)  CT Angio no PE/ infiltrates/ effusions/ mass/ adenopathy, norm hrt size; incidental- 19mm cyst left kidney, 19mm low density nodule left lobe of thyroid & 6mm nodule in isthmus=> they rec Thyroid Sonar (see report)...  HYPERCHOLESTEROLEMIA, BORDERLINE (ICD-272.4) - on diet alone + Fish Oil... ~  FLP 4/10 (wt=203#) showed TChol 209, TG 116, HDL 67, LDL 105 ~  FLP 4/11 (wt=210#) showed TChol 175, TG 138, HDL 61, LDL 87 ~  FLP 4/12 (wt=211#) showed TChol 203, TG 169, HDL 65, LDL 105 ~  FLP 4/13 (wt=183#) showed TChol 189, TG 117, HDL 83, LDL 82... Great job w/ wt reduction! ~  FLP 4/14 on diet alone (wt=197#) showed TChol 188, TG 125, HDL 67, LDL 97  GERD (ICD-530.81) - on NEXIUM 40mg ... ~  last EGD was 4/07 by DrPatterson showing chr GERD and gastric polyps...  ~  AbdSonar 2007 w/ no gallstones, negative sonar...  ~  HIDA Scan 5/09 was norm w/ eject fract 76% & patent  ducts...  IRRITABLE BOWEL SYNDROME (ICD-564.1) >>  ~  last colonoscopy 9/07 by DrPatterson showed melanosis only, no other abn...  CONSTIPATION  (ICD-564.00) - on Metamucil etc... Asked to start regimen w/ Miralax, Senakot-S daily...  DEGENERATIVE JOINT DISEASE (ICD-715.90) - she uses Arthritis Tylenol as needed... ~  10/11:  she reports followed by DrWainer w/ cortisone shots in neck planned- currently taking "mustard" for "body cramps" but it upset her stomach; asked to try tonic water for nocturnal leg cramps, and incr [id number] Tid Prn muscle spasm. ~  4/12:  She states she's had XRays & 2 MRIs from DrWainer w/ "alot of arthritis & degenerating discs"; given 2 shots- helped short term;  She stopped Soma & just uses Alpraz Prn... ~  9/13: c/o 67mo right shoulder pain & Dx w/ tendonitis per DrWainer- shot given...  OSTEOPOROSIS (ICD-733.00) - on Calcium and Vits w/ 1000 VitD... off prev fosamax w/ norm BMD... ~  BMD 4/09 much improved w/ TScores -0.2 to -0.7.Marland KitchenMarland Kitchen Fosamax decr to [employer]. ~  Vit D level 4/09 = 32... rec> Vit D 1000 u daily... ~  Vit D level 4/10 = 46... rec> continue same. ~  5/11:  BMD here showed TScores -0.7 in Spine, and -0.5 in left FemNeck... rec> stop Fosamax for drug holiday. ~  BMD 11/14 shows TScores -1.1 in Spine, and -0.1 in right [facility];  Stable & does not need Fosamax; continue Ca, MVI, VitD, & wt bearing exercise...  HEADACHE (ICD-784.0) >> seen by DrWong w/ mult symptoms including HA, Dizzy, ?RLS, ?Neuropathy.>> ~  CT Head 8/12 showed chr microvasc dis, no acute findings... ~  MRI 10/12 showed sm vessel dis, no acute abnormalities... ~  s/p neuropathy work up by [employer] w/ evid of chronic right L5-S1 radiculopathy; She saw DrFreeman 11/13- Rx Neurontin300Qhs & 100mg Prn days if needed, but she feels this is causing depression stating that she thinks of dying all day long; Rec to stop Neurontin & consider antidepressant but she wants to wait until her f/u appt w/ Neuro, DrFreeman...  DIZZINESS >> Eval 10/12 by DrWong-Neurology w/ prob BPPV & he suggested vestib rehab... ~  MRI/ MRA showed sm vessel dis, otherw  no acute changes, prob hypoplastic right vertebral art...  ?PERIPHERAL NEUROPATHY >> ?Etiology w/ eval from DrWong 10/12 including PN Labs- all essentially wnl x sed=35/ crp=2.37... ~  EMG/NCV done in [place] due to her [employer] insurance ==> evid for chr right L5-S1 radiculopathy ~  DrWong also thought she had RLS & gave her a Requip trial=> & improved & she uses this just prn...  ANXIETY (ICD-300.00) - on ALPRAZOLAM 0.5mg  as needed... her husb had an MI, cares for elderly mother.  S/P Shingles >> she had an outbreak on the right side of her neck 3/13 & treated by TP w/ Valtrex and resolved...  ~  4/14:  S/p shingles vaccine given at Pharmacy...   Past Surgical History  Procedure Laterality Date  . Parathyroid adenoma removed    . Vesicovaginal fistula closure w/ tah    . Tonsillectomy    . Appendectomy    . Tubal ligation  1981  . Rotator cuff repair      X2  . Foot surgery      RIGHT AND LEFT  . Colposcopy    . Oophorectomy      BSO  . Cataract extraction  2013  . Shoulder injection  10/2011  . Abdominal hysterectomy  1983    TAH/BSO for pelvic pain and adhesions  Outpatient Encounter Prescriptions as of 03/04/2013  Medication Sig  . ALPRAZolam (XANAX) 0.5 MG tablet Take 1 tablet (0.5 mg total) by mouth 3 (three) times daily as needed.  Marland Kitchen amLODipine-benazepril (LOTREL) 5-20 MG per capsule Take 1 capsule by mouth daily.  Marland Kitchen aspirin 81 MG tablet Take 81 mg by mouth daily.    . cholecalciferol (VITAMIN D) 1000 UNITS tablet Take 1,000 Units by mouth daily.  . clonazePAM (KLONOPIN) 0.5 MG tablet Take 1 tablet (0.5 mg total) by mouth 2 (two) times daily as needed for anxiety.  Marland Kitchen esomeprazole (NEXIUM) 40 MG capsule TAKE 1 CAPSULE BY MOUTH TWICE  A DAY 30 MINUTES BEFORE MEALS  . estrogens, conjugated, (PREMARIN) 0.625 MG tablet Take 1 tablet by mouth daily  . metoprolol succinate (TOPROL-XL) 100 MG 24 hr tablet TAKE ONE-HALF (1/2) TABLET DAILY  . Omega-3 Fatty Acids (FISH OIL) 1000 MG  CAPS Take as directed     Allergies  Allergen Reactions  . Prednisone     REACTION: heart palpatations  In larger doses  . Rofecoxib     REACTION: mouth swelling (VIOXX)  . Sulfonamide Derivatives     REACTION: pt unable to remember  . Terfenadine     SELDANE    Current Medications, Allergies, Past Medical History, Past Surgical History, Family History, and Social History were reviewed in [employer] record.    Review of Systems        See HPI - all other systems neg except as noted... The patient complains of dyspnea on exertion.  The patient denies anorexia, fever, weight loss, weight gain, vision loss, decreased hearing, hoarseness, chest pain, syncope, peripheral edema, prolonged cough, headaches, hemoptysis, abdominal pain, melena, hematochezia, severe indigestion/heartburn, hematuria, incontinence, muscle weakness, suspicious skin lesions, transient blindness, difficulty walking, depression, unusual weight change, abnormal bleeding, enlarged lymph nodes, and angioedema.     Objective:   Physical Exam      WD, WN, 72 y/o BF in NAD... GENERAL:  Alert & oriented; pleasant & cooperative... HEENT:  [place]/AT, EOM-wnl, PERRLA, no nystagmus, EACs-clear, TMs-wnl, NOSE- sl red w/ drainage, THROAT-clear & wnl. NECK:  Supple w/ fairROM; no JVD; normal carotid impulses w/o bruits; no thyromegaly or nodules palpated; no lymphadenopathy. CHEST:  Clear to P & A; without wheezes/ rales/ or rhonchi. HEART:  Regular Rhythm; without murmurs/ rubs/ or gallops. ABDOMEN:  Soft & nontender; normal bowel sounds; no organomegaly or masses detected. EXT: without deformities, mild arthritic changes; no varicose veins/ +venous insuffic/ no edema. NEURO:  CN's intact; motor testing normal; sensory testing ?diminished peripherally; ?balance off but no specific gait abn noted. Decreased ankle jerk reflexes... DERM:  No lesions noted; no rash etc..  RADIOLOGY DATA:  Reviewed in the  EPIC EMR & discussed w/ the patient...  LABORATORY DATA:  Reviewed in the EPIC EMR & discussed w/ the patient...   Assessment & Plan:    Dyspnea & chest discomfort> hx is c/w anxiety but she was not reassured; we did Labs, PF, EKG, CT Angio- all wnl (incidental renal cyst & thyroid nodules)... Rx w/ KLONOPIN 0.5mg Bid & f/u 3 weeks. ~  2/15:  She is 75% better but not taking enough of the Klonopin> Rec to take the Klonopin to 1/2 in AM, 1/2 in PM, & ok to take 1 in the eve.   HBP>  Controlled on BBlocker, ACE/CCB; continue the Metoprolol at 1/2 of the 100mg  tab in AM, & the Lotrel PM dosing; she will monitor her BP.  CHOL>  FLP is close to goal values on the diet & exercise program, no meds other than Fish Oil; continue same...  GERD>  Controlled on Nexium...  DJD>  Followed by DrWainer, eval as above & she uses arthritis tylenol alone (doesn't want stronger pain meds)...  Osteopenia>  BMD 11/14 was WNL & she is on a bisphos drug holiday......  Headaches>  Prev on Neurontin per DrFreeman & HAs improved but she is concerned this is making her depressed=> Rec to stop the Gabapentin & better off this med.  Dizziness/ HA>  ?BPPV & we reviewed Epley maneuver w/ her; she is already on ASA daily w/ her known sm vessel dis; she is reassured by the prev Neurology eval from DrWong...  ?Peripheral Neuropathy/ ?RLS>  She hadwork up from [employer] given trial of Requip...  Anxiety>  She is under a mod amt of stress & uses Alpraz prn...   Patient's Medications  New Prescriptions   No medications on file  Previous Medications   ALPRAZOLAM (XANAX) 0.5 MG TABLET    Take 1 tablet (0.5 mg total) by mouth 3 (three) times daily as needed.   AMLODIPINE-BENAZEPRIL (LOTREL) 5-20 MG PER CAPSULE    Take 1 capsule by mouth daily.   ASPIRIN 81 MG TABLET    Take 81 mg by mouth daily.     CHOLECALCIFEROL (VITAMIN D) 1000 UNITS TABLET    Take 1,000 Units by mouth daily.   CLONAZEPAM (KLONOPIN) 0.5 MG TABLET     Take 1 tablet (0.5 mg total) by mouth 2 (two) times daily as needed for anxiety.   ESOMEPRAZOLE (NEXIUM) 40 MG CAPSULE    TAKE 1 CAPSULE BY MOUTH TWICE  A DAY 30 MINUTES BEFORE MEALS   ESTROGENS, CONJUGATED, (PREMARIN) 0.625 MG TABLET    Take 1 tablet by mouth daily   METOPROLOL SUCCINATE (TOPROL-XL) 100 MG 24 HR TABLET    TAKE ONE-HALF (1/2) TABLET DAILY   OMEGA-3 FATTY ACIDS (FISH OIL) 1000 MG CAPS    Take as directed   Modified Medications   No medications on file  Discontinued Medications   No medications on file

## 2013-03-04 NOTE — Patient Instructions (Signed)
Today we updated your med list in our EPIC system...    Continue your current medications the same...  We discussed staying on the Klonopin for your breathing...  Let's slowly increase the exercise program as well...  Call for any questions...  Let's plan a follow up visit in 3 months.Marland KitchenMarland Kitchen

## 2013-03-23 ENCOUNTER — Other Ambulatory Visit: Payer: Self-pay | Admitting: Pulmonary Disease

## 2013-03-25 ENCOUNTER — Encounter: Payer: Self-pay | Admitting: Neurology

## 2013-03-26 ENCOUNTER — Telehealth: Payer: Self-pay | Admitting: Pulmonary Disease

## 2013-03-26 ENCOUNTER — Other Ambulatory Visit: Payer: Self-pay | Admitting: Pulmonary Disease

## 2013-03-26 DIAGNOSIS — E785 Hyperlipidemia, unspecified: Secondary | ICD-10-CM

## 2013-03-26 DIAGNOSIS — K449 Diaphragmatic hernia without obstruction or gangrene: Secondary | ICD-10-CM

## 2013-03-26 NOTE — Telephone Encounter (Signed)
Pt is aware that SN will not be in the office on 05/25/13. She states her breathing is fine right now. Will call back if she starts having issues. Advised her that SN can see her for breathing issues but not primary care.

## 2013-04-01 ENCOUNTER — Ambulatory Visit: Payer: Medicare Other | Admitting: Neurology

## 2013-04-10 ENCOUNTER — Encounter: Payer: Self-pay | Admitting: Pulmonary Disease

## 2013-04-12 NOTE — Telephone Encounter (Signed)
SN please advise on pt stopping this medication. Thanks.

## 2013-04-22 ENCOUNTER — Ambulatory Visit: Payer: Medicare Other | Admitting: Family Medicine

## 2013-04-28 ENCOUNTER — Telehealth: Payer: Self-pay | Admitting: *Deleted

## 2013-04-28 ENCOUNTER — Telehealth: Payer: Self-pay | Admitting: Pulmonary Disease

## 2013-04-28 MED ORDER — ESTROGENS CONJUGATED 0.625 MG PO TABS: ORAL_TABLET | ORAL | Status: DC

## 2013-04-28 NOTE — Telephone Encounter (Signed)
Pt calling requesting 90 day supply for premarin 0.625 gm tablet, pt would like a printed Rx to take to another pharmacy.

## 2013-04-28 NOTE — Addendum Note (Signed)
Addended by: Thamas Jaegers on: 04/28/2013 02:09 PM   Modules accepted: Orders, Medications

## 2013-04-28 NOTE — Telephone Encounter (Signed)
LMTC x 1  

## 2013-04-28 NOTE — Telephone Encounter (Signed)
Left on voicemail to come pick up Rx

## 2013-04-29 ENCOUNTER — Other Ambulatory Visit: Payer: Self-pay

## 2013-04-29 MED ORDER — ESOMEPRAZOLE MAGNESIUM 40 MG PO CPDR: DELAYED_RELEASE_CAPSULE | ORAL | Status: DC

## 2013-04-29 NOTE — Telephone Encounter (Signed)
Rx has been printed for SN to sign. Pt is aware that we will have this ready for her a little later this afternoon. Nothing further was needed.

## 2013-05-25 ENCOUNTER — Ambulatory Visit (INDEPENDENT_AMBULATORY_CARE_PROVIDER_SITE_OTHER): Payer: Medicare Other | Admitting: Family Medicine

## 2013-05-25 ENCOUNTER — Encounter: Payer: Self-pay | Admitting: Family Medicine

## 2013-05-25 ENCOUNTER — Ambulatory Visit: Payer: Medicare Other | Admitting: Pulmonary Disease

## 2013-05-25 VITALS — BP 130/72 | HR 65 | Temp 97.8°F | Wt 199.0 lb

## 2013-05-25 DIAGNOSIS — M1990 Unspecified osteoarthritis, unspecified site: Secondary | ICD-10-CM | POA: Diagnosis not present

## 2013-05-25 DIAGNOSIS — K219 Gastro-esophageal reflux disease without esophagitis: Secondary | ICD-10-CM

## 2013-05-25 DIAGNOSIS — I10 Essential (primary) hypertension: Secondary | ICD-10-CM | POA: Diagnosis not present

## 2013-05-25 DIAGNOSIS — Z23 Encounter for immunization: Secondary | ICD-10-CM

## 2013-05-25 DIAGNOSIS — K589 Irritable bowel syndrome without diarrhea: Secondary | ICD-10-CM | POA: Diagnosis not present

## 2013-05-25 DIAGNOSIS — E669 Obesity, unspecified: Secondary | ICD-10-CM | POA: Insufficient documentation

## 2013-05-25 NOTE — Progress Notes (Signed)
Subjective:    Patient ID: Margaret Mitchell, female    DOB: [birth year], 73 y.o.   MRN: [id number]  HPI Patient seen to establish care. Her chronic problems include hypertension, GERD, IBS, degenerative arthritis, possible migraine headaches, history of cervical dysplasia, restless leg syndrome, osteopenia, and questionable history of hyperparathyroidism. She's had previous neck surgery she reports for parathyroid adenoma excision. She apparently had remote history of hypercalcemia when this was diagnosed. Her current medications are reviewed. She has not taken her blood pressure medications morning. Generally compliant medications. No recent dizziness. No chest pains.  Nonsmoker. She sees gynecologist regularly. Gets regular mammograms. Colonoscopy up to date. No history of Prevnar 13. Otherwise, immunizations up to date.  She has obesity but weight has been stable over the past year. She's actually lost about 5 pounds due to her efforts over the past few months. She is starting to walk more.  Her GERD symptoms are stable. She takes Nexium twice daily  Past Medical History  Diagnosis Date  . Allergy, unspecified not elsewhere classified   . IBS (irritable bowel syndrome)   . Headache(784.0)   . Pelvic adhesions   . Pelvic pain in female   . Menorrhagia   . Sinus infection   . Anxiety   . Hypertension   . Osteoporosis   . Thyroid disease     Parathyroid cyst  . Cervical dysplasia     prior to hysterectomy   Past Surgical History  Procedure Laterality Date  . Parathyroid adenoma removed    . Vesicovaginal fistula closure w/ tah    . Tonsillectomy    . Appendectomy    . Tubal ligation  1981  . Rotator cuff repair      X2  . Foot surgery      RIGHT AND LEFT  . Colposcopy    . Oophorectomy      BSO  . Cataract extraction  2013  . Shoulder injection  10/2011  . Abdominal hysterectomy  1983    TAH/BSO for pelvic pain and adhesions    reports that she has never smoked. She  has never used smokeless tobacco. She reports that she does not drink alcohol or use illicit drugs. family history includes Cancer in her brother and father; Diabetes in her maternal aunt, maternal grandmother, and mother; Heart disease in her maternal grandmother; Heart failure in her paternal grandmother; Hypertension in her maternal grandmother; Lung cancer in her father; Stroke in her maternal grandmother. Allergies  Allergen Reactions  . Prednisone     REACTION: heart palpatations  In larger doses  . Rofecoxib     REACTION: mouth swelling (VIOXX)  . Sulfonamide Derivatives     REACTION: pt unable to remember  . Terfenadine     SELDANE      Review of Systems  Constitutional: Negative for fatigue.  Eyes: Negative for visual disturbance.  Respiratory: Negative for cough, chest tightness, shortness of breath and wheezing.   Cardiovascular: Negative for chest pain, palpitations and leg swelling.  Gastrointestinal: Negative for abdominal pain.  Endocrine: Negative for polydipsia and polyuria.  Neurological: Negative for dizziness, seizures, syncope, weakness, light-headedness and headaches.       Objective:   Physical Exam  Constitutional: She is oriented to person, place, and time. She appears well-developed and well-nourished.  Neck: Neck supple.  Scar from previous thyroid surgery. No distinct masses  Cardiovascular: Normal rate and regular rhythm.   Pulmonary/Chest: Effort normal and breath sounds normal. No respiratory distress. She  has no wheezes. She has no rales.  Musculoskeletal: She exhibits no edema.  Neurological: She is alert and oriented to person, place, and time.          Assessment & Plan:  #1 hypertension. Well controlled. Continue current medications #2 history of GERD treated with proton pump inhibitor. Symptomatically stable #3 history of cervical dysplasia. She is followed by gynecology #4 postmenopausal on estrogen per GYN #5 health maintenance.  Prevnar 13 vaccine given

## 2013-05-25 NOTE — Progress Notes (Signed)
Pre visit review using our clinic review tool, if applicable. No additional management support is needed unless otherwise documented below in the visit note. 

## 2013-05-25 NOTE — Addendum Note (Signed)
Addended by: Noe Gens E on: 05/25/2013 10:02 AM   Modules accepted: Orders

## 2013-06-03 DIAGNOSIS — M25529 Pain in unspecified elbow: Secondary | ICD-10-CM | POA: Diagnosis not present

## 2013-06-03 DIAGNOSIS — M7710 Lateral epicondylitis, unspecified elbow: Secondary | ICD-10-CM | POA: Diagnosis not present

## 2013-06-15 ENCOUNTER — Telehealth: Payer: Self-pay | Admitting: Family Medicine

## 2013-06-15 ENCOUNTER — Encounter: Payer: Self-pay | Admitting: Family Medicine

## 2013-06-15 ENCOUNTER — Ambulatory Visit (INDEPENDENT_AMBULATORY_CARE_PROVIDER_SITE_OTHER): Payer: Medicare Other | Admitting: Family Medicine

## 2013-06-15 VITALS — BP 124/80 | Temp 97.8°F | Wt 200.0 lb

## 2013-06-15 DIAGNOSIS — R252 Cramp and spasm: Secondary | ICD-10-CM

## 2013-06-15 LAB — CBC WITH DIFFERENTIAL/PLATELET
Basophils Absolute: 0 10*3/uL (ref 0.0–0.1)
Basophils Relative: 0.5 % (ref 0.0–3.0)
EOS ABS: 0.1 10*3/uL (ref 0.0–0.7)
EOS PCT: 0.7 % (ref 0.0–5.0)
HCT: 39.5 % (ref 36.0–46.0)
Hemoglobin: 13.1 g/dL (ref 12.0–15.0)
LYMPHS PCT: 41 % (ref 12.0–46.0)
Lymphs Abs: 3.6 10*3/uL (ref 0.7–4.0)
MCHC: 33.2 g/dL (ref 30.0–36.0)
MCV: 92 fl (ref 78.0–100.0)
MONO ABS: 0.8 10*3/uL (ref 0.1–1.0)
Monocytes Relative: 9 % (ref 3.0–12.0)
NEUTROS PCT: 48.8 % (ref 43.0–77.0)
Neutro Abs: 4.3 10*3/uL (ref 1.4–7.7)
PLATELETS: 312 10*3/uL (ref 150.0–400.0)
RBC: 4.29 Mil/uL (ref 3.87–5.11)
RDW: 13.2 % (ref 11.5–15.5)
WBC: 8.8 10*3/uL (ref 4.0–10.5)

## 2013-06-15 LAB — BASIC METABOLIC PANEL
BUN: 13 mg/dL (ref 6–23)
CO2: 25 mEq/L (ref 19–32)
Calcium: 8.9 mg/dL (ref 8.4–10.5)
Chloride: 103 mEq/L (ref 96–112)
Creatinine, Ser: 0.8 mg/dL (ref 0.4–1.2)
GFR: 85.94 mL/min (ref >60.00)
GLUCOSE: 87 mg/dL (ref 70–99)
POTASSIUM: 4 meq/L (ref 3.5–5.1)
SODIUM: 135 meq/L (ref 135–145)

## 2013-06-15 LAB — TSH: TSH: 1.46 u[IU]/mL (ref 0.35–4.50)

## 2013-06-15 NOTE — Telephone Encounter (Signed)
Patient Information:  Caller Name: Yoko  Phone: [phone]  Patient: Margaret Mitchell, Margaret Mitchell  Gender: Female  DOB: 09-13-1940  Age: 73 Years  PCP: Carolann Littler (Family Practice)  Office Follow Up:  Does the office need to follow up with this patient?: No  Instructions For The Office: N/A   Symptoms  Reason For Call & Symptoms: Calling about having muscle cramps in feet, thighs, sides of abdomen and hands- onset 06/04/13. Afebrile. She is drinking plenty of fluids- mainly water and eats yogurt and cheese on occasion. Does not drink milk. She is waking up with cramps in her thighs and is not sleeping well. No swelling of extremities. She is getting cramps in hands during the day that are impacting ability to do ADL's. Hx cramping with menses when younger. Stopped taking Premerin one week ago for breast tenderness.   Reviewed Health History In EMR: Yes  Reviewed Medications In EMR: Yes  Reviewed Allergies In EMR: Yes  Reviewed Surgeries / Procedures: Yes  Date of Onset of Symptoms: 06/04/2013  Treatments Tried: [place]yland Leg Cramps, Warm showers, Heating pad  Treatments Tried Worked: No  Guideline(s) Used:  Leg Pain  Disposition Per Guideline:   See Within 2 Weeks in Office  Reason For Disposition Reached:   Leg pain or muscle cramp is a chronic symptom (recurrent or ongoing AND lasting > 4 weeks)  Advice Given:  Reassurance - Muscle Cramps  During attacks, you can break the muscle spasm by stretching the muscle in the direction opposite to how it is being pulled by the cramp or spasm. For example, for a calf cramp, pull the foot and toes backward as far as they will go.  Fluids for Heat Cramps  Drink 1 cup (8 oz; 240 ml) of cold water every 15 minutes for the next 2 hours. Also eat some salty foods (e.g., potato chips or pretzels).  OR drink a sports - rehydration drink (e.g., Gatorade or Powerade), which contains sugar and salt.  Expected Course:  Muscle cramps usually last 5  to 30 minutes. Once the muscle cramp stops, the muscle returns to normal quickly. The pain should go away completely.  If you have frequent muscle cramps, then you should call your doctor (call us back) when the office is open. Sometimes the doctor can give medications to reduce the muscle cramps.  Prevention  : Future attacks may be prevented by daily stretching exercises of the heel cords. Stand with the knees straight and stretch the ankles by leaning forward against a wall.  Call Back If:  Calf swelling or constant leg pain occur  Signs of infection occur (e.g., spreading redness, warmth, fever)  You become worse.  Patient Will Follow Care Advice:  YES  Appointment Scheduled:  06/15/2013 12:30:00 Appointment Scheduled Provider:  Stevie K[facility])

## 2013-06-15 NOTE — Progress Notes (Signed)
Pre visit review using our clinic review tool, if applicable. No additional management support is needed unless otherwise documented below in the visit note. 

## 2013-06-15 NOTE — Patient Instructions (Signed)
Labs today  If we cannot determine the etiology of her problem then we will refer you to neurology for further evaluation

## 2013-06-15 NOTE — Progress Notes (Signed)
Subjective:    Patient ID: Margaret Mitchell, female    DOB: 1940-02-17, 73 y.o.   MRN: [id number]  HPI Margaret Mitchell is a 73 year old female nonsmoker  To Dr. Caryl Never,,,,,,,,,,,, previously followed up by Dr.nadel,,,,,,,,,,, who comes in today for evaluation of leg cramps and general Oddi cramping  She states she's had this problem for many years. He comes and goes. She's had numerous diagnostic workups all of which were normal. This episode started about 2 weeks ago and she says she has muscle cramps all over. She did have a parathyroid adenoma Windell Moulding years ago. I think it be worthwhile to do a metabolic workup.   Review of Systems Review of systems otherwise negative    Objective:   Physical Exam  Well-developed well-nourished female no acute distress vital signs stable she is afebrile no objective muscle twitching      Assessment & Plan:  Diffuse cramping etiology unknown again workup

## 2013-06-16 ENCOUNTER — Ambulatory Visit: Payer: Medicare Other

## 2013-06-16 DIAGNOSIS — R252 Cramp and spasm: Secondary | ICD-10-CM

## 2013-06-16 LAB — PTH, INTACT AND CALCIUM
CALCIUM: 9.3 mg/dL (ref 8.4–10.5)
PTH: 49.2 pg/mL (ref 14.0–72.0)

## 2013-06-16 LAB — PHOSPHORUS: PHOSPHORUS: 4 mg/dL (ref 2.3–4.6)

## 2013-06-21 ENCOUNTER — Telehealth: Payer: Self-pay | Admitting: Family Medicine

## 2013-06-21 ENCOUNTER — Other Ambulatory Visit: Payer: Self-pay | Admitting: Pulmonary Disease

## 2013-06-21 DIAGNOSIS — R252 Cramp and spasm: Secondary | ICD-10-CM

## 2013-06-21 DIAGNOSIS — M79606 Pain in leg, unspecified: Secondary | ICD-10-CM

## 2013-06-21 NOTE — Telephone Encounter (Signed)
Left message on machine for patient to return our call.  Lab results have been released to [employer]

## 2013-06-21 NOTE — Telephone Encounter (Signed)
Pt saw dr. Sherren Mocha on 06/15/13, pt wants to know if her labs are back from the blood work.

## 2013-06-22 NOTE — Telephone Encounter (Signed)
Left detailed message on machine for patient with lab results and referral placed for neuro.

## 2013-07-27 ENCOUNTER — Ambulatory Visit (INDEPENDENT_AMBULATORY_CARE_PROVIDER_SITE_OTHER): Payer: Medicare Other | Admitting: Family Medicine

## 2013-07-27 ENCOUNTER — Encounter: Payer: Self-pay | Admitting: Family Medicine

## 2013-07-27 VITALS — BP 134/82 | HR 71 | Temp 98.1°F | Wt 200.0 lb

## 2013-07-27 DIAGNOSIS — K5900 Constipation, unspecified: Secondary | ICD-10-CM

## 2013-07-27 DIAGNOSIS — R195 Other fecal abnormalities: Secondary | ICD-10-CM

## 2013-07-27 NOTE — Progress Notes (Signed)
Subjective:    Patient ID: Margaret Mitchell, female    DOB: [birth year], 73 y.o.   MRN: [id number]  Abdominal Pain Associated symptoms include constipation. Pertinent negatives include no diarrhea, fever, nausea or vomiting.   Patient seen with 2 months of some progressive constipation. She's had frequent similar problems in the past. She has occasional lower abdominal cramps and earlier this week noticed some ?dark colored blood per rectum. 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 She denies regular laxative use. She is drinking plenty of fluids. She walks some for exercise. Takes regular Metamucil and occasional Senokot. Recent hemoglobin 13.1 back in early June. No nausea or vomiting. No appetite or weight changes.  Past Medical History  Diagnosis Date  . Allergy, unspecified not elsewhere classified   . IBS (irritable bowel syndrome)   . Headache(784.0)   . Pelvic adhesions   . Pelvic pain in female   . Menorrhagia   . Sinus infection   . Anxiety   . Hypertension   . Osteoporosis   . Thyroid disease     Parathyroid cyst  . Cervical dysplasia     prior to hysterectomy   Past Surgical History  Procedure Laterality Date  . Parathyroid adenoma removed    . Vesicovaginal fistula closure w/ tah    . Tonsillectomy    . Appendectomy    . Tubal ligation  1981  . Rotator cuff repair      X2  . Foot surgery      RIGHT AND LEFT  . Colposcopy    . Oophorectomy      BSO  . Cataract extraction  2013  . Shoulder injection  10/2011  . Abdominal hysterectomy  1983    TAH/BSO for pelvic pain and adhesions    reports that she has never smoked. She has never used smokeless tobacco. She reports that she does not drink alcohol or use illicit drugs. family history includes Cancer in her brother and father; Diabetes in her maternal aunt, maternal grandmother, and mother; Heart disease in her maternal grandmother; Heart failure in her paternal grandmother; Hypertension in her maternal  grandmother; Lung cancer in her father; Stroke in her maternal grandmother. Allergies  Allergen Reactions  . Prednisone     REACTION: heart palpatations  In larger doses  . Rofecoxib     REACTION: mouth swelling (VIOXX)  . Sulfonamide Derivatives     REACTION: pt unable to remember  . Terfenadine     SELDANE      Review of Systems  Constitutional: Negative for fever, chills, appetite change and unexpected weight change.  Respiratory: Negative for shortness of breath.   Gastrointestinal: Positive for abdominal pain and constipation. Negative for nausea, vomiting and diarrhea.  Neurological: Negative for dizziness and weakness.  Hematological: Does not bruise/bleed easily.       Objective:   Physical Exam  Constitutional: She appears well-developed and well-nourished.  HENT:  Mouth/Throat: Oropharynx is clear and moist.  Cardiovascular: Normal rate and regular rhythm.   Pulmonary/Chest: Effort normal and breath sounds normal. No respiratory distress. She has no wheezes. She has no rales.  Abdominal: Soft. Bowel sounds are normal. She exhibits no distension and no mass. There is no tenderness. There is no rebound and no guarding.  Genitourinary:  Rectal no  Mass or impaction.  Scant brown, heme negative stool.          Assessment & Plan:  Stool changes.  She has some chronic constipation with recent exacerbation  and ? Of blood in stool, though hemoccult negative today and benign exam. Measures to reduce constipation.  Miralax as needed. No constipating meds.

## 2013-07-27 NOTE — Progress Notes (Signed)
Pre visit review using our clinic review tool, if applicable. No additional management support is needed unless otherwise documented below in the visit note. 

## 2013-07-27 NOTE — Patient Instructions (Signed)

## 2013-08-14 ENCOUNTER — Other Ambulatory Visit: Payer: Self-pay | Admitting: Pulmonary Disease

## 2013-09-06 DIAGNOSIS — Z961 Presence of intraocular lens: Secondary | ICD-10-CM | POA: Diagnosis not present

## 2013-09-06 DIAGNOSIS — H04129 Dry eye syndrome of unspecified lacrimal gland: Secondary | ICD-10-CM | POA: Diagnosis not present

## 2013-09-06 DIAGNOSIS — H26499 Other secondary cataract, unspecified eye: Secondary | ICD-10-CM | POA: Diagnosis not present

## 2013-09-10 ENCOUNTER — Telehealth: Payer: Self-pay | Admitting: Family Medicine

## 2013-09-10 MED ORDER — AMLODIPINE BESY-BENAZEPRIL HCL 5-20 MG PO CAPS: 1.0000 | ORAL_CAPSULE | Freq: Every day | ORAL | Status: DC

## 2013-09-10 NOTE — Telephone Encounter (Signed)
Pt is needing new rx for amLODipine-benazepril (LOTREL) 5-20 MG per capsule, [facility]

## 2013-09-10 NOTE — Telephone Encounter (Signed)
RX sent to pharmacy  

## 2013-10-01 ENCOUNTER — Ambulatory Visit (INDEPENDENT_AMBULATORY_CARE_PROVIDER_SITE_OTHER): Payer: Medicare Other | Admitting: Gynecology

## 2013-10-01 ENCOUNTER — Encounter: Payer: Self-pay | Admitting: Gynecology

## 2013-10-01 VITALS — BP 124/76 | Ht 66.5 in | Wt 204.0 lb

## 2013-10-01 DIAGNOSIS — M899 Disorder of bone, unspecified: Secondary | ICD-10-CM

## 2013-10-01 DIAGNOSIS — R82998 Other abnormal findings in urine: Secondary | ICD-10-CM | POA: Diagnosis not present

## 2013-10-01 DIAGNOSIS — M949 Disorder of cartilage, unspecified: Secondary | ICD-10-CM | POA: Diagnosis not present

## 2013-10-01 DIAGNOSIS — M8580 Other specified disorders of bone density and structure, unspecified site: Secondary | ICD-10-CM

## 2013-10-01 DIAGNOSIS — N952 Postmenopausal atrophic vaginitis: Secondary | ICD-10-CM | POA: Diagnosis not present

## 2013-10-01 DIAGNOSIS — N762 Acute vulvitis: Secondary | ICD-10-CM

## 2013-10-01 DIAGNOSIS — N760 Acute vaginitis: Secondary | ICD-10-CM

## 2013-10-01 NOTE — Progress Notes (Addendum)
Margaret Mitchell 27-Sep-1940 [id number]        73 y.o.  G1P0010 for Follow up exam  Past medical history,surgical history, problem list, medications, allergies, family history and social history were all reviewed and documented as reviewed in the EPIC chart.  ROS:  12 system ROS performed with pertinent positives and negatives included in the history, assessment and plan.   Additional significant findings :  none   Exam: [name] Counsellor Vitals:   10/01/13 0821  BP: 124/76  Height: 5' 6.5" (1.689 m)  Weight: 204 lb (92.534 kg)   General appearance:  Normal affect, orientation and appearance. Skin: Grossly normal HEENT: Without gross lesions.  No cervical or supraclavicular adenopathy. Thyroid normal.  Lungs:  Clear without wheezing, rales or rhonchi Cardiac: RR, without RMG Abdominal:  Soft, nontender, without masses, guarding, rebound, organomegaly or hernia Breasts:  Examined lying and sitting without masses, retractions, discharge or axillary adenopathy. Pelvic:  Ext/BUS/vagina with generalized atrophic changes  Adnexa  Without masses or tenderness    Anus and perineum  Normal   Rectovaginal  Normal sphincter tone without palpated masses or tenderness.    Assessment/Plan:  73 y.o. G1P0010 female for follow up exam.   1. Postmenopausal/atrophic genital changes.  Without significant symptoms of hot flashes, night sweats, vaginal dryness. Is not sexually active.  Status post TAH BSO for pelvic pain and adhesions. Had been on Premarin ERT but stopped it last year and has done well without significant symptoms. We'll continue to monitor. 2. History of osteoporosis or Dr. Valeta Harms note.  Had been on Actonel and Fosamax 2000 04/15/2009. Most recent DEXA 2014 shows AP spine T score -1.1, left hip normal. I do not have copies of her prior DEXAs. It is a little unusual that she would have such an impressive recovery and I am unsure of the original diagnosis of osteoporosis. Regardless  will continue to monitor and repeat DEXA in several years.  Increased calcium vitamin D reviewed 3. History of vaginitis/vulvitis treated with boric acid suppositories at the earliest sign of irritation. Patient notes that she has not had to use this for some time and has a supply at home. Will call if she needs more. 4. Pap smear 2011. No Pap smear done today. No history of significant abnormal Pap smears. Status post hysterectomy for benign indications. Over the age of 35. We both agreed to stop screening and she is comfortable with this. 5. Mammography coming due in October and I reminded her to schedule this. SBE monthly reviewed. 6. Colonoscopy 2007. Reported repeat interval 10 years. 7. Health maintenance. No routine blood work done as this is done through her primary physician's office. Follow up one year, sooner as needed.     Anastasio Auerbach MD, 8:45 AM 10/01/2013

## 2013-10-01 NOTE — Patient Instructions (Signed)
Follow up in one year, sooner if any issues.  You may obtain a copy of any labs that were done today by logging onto MyChart as outlined in the instructions provided with your AVS (after visit summary). The office will not call with normal lab results but certainly if there are any significant abnormalities then we will contact you.   Health Maintenance, Female A healthy lifestyle and preventative care can promote health and wellness.  Maintain regular health, dental, and eye exams.  Eat a healthy diet. Foods like vegetables, fruits, whole grains, low-fat dairy products, and lean protein foods contain the nutrients you need without too many calories. Decrease your intake of foods high in solid fats, added sugars, and salt. Get information about a proper diet from your caregiver, if necessary.  Regular physical exercise is one of the most important things you can do for your health. Most adults should get at least 150 minutes of moderate-intensity exercise (any activity that increases your heart rate and causes you to sweat) each week. In addition, most adults need muscle-strengthening exercises on 2 or more days a week.   Maintain a healthy weight. The body mass index (BMI) is a screening tool to identify possible weight problems. It provides an estimate of body fat based on height and weight. Your caregiver can help determine your BMI, and can help you achieve or maintain a healthy weight. For adults 20 years and older:  A BMI below 18.5 is considered underweight.  A BMI of 18.5 to 24.9 is normal.  A BMI of 25 to 29.9 is considered overweight.  A BMI of 30 and above is considered obese.  Maintain normal blood lipids and cholesterol by exercising and minimizing your intake of saturated fat. Eat a balanced diet with plenty of fruits and vegetables. Blood tests for lipids and cholesterol should begin at age 20 and be repeated every 5 years. If your lipid or cholesterol levels are high, you are  over 50, or you are a high risk for heart disease, you may need your cholesterol levels checked more frequently.Ongoing high lipid and cholesterol levels should be treated with medicines if diet and exercise are not effective.  If you smoke, find out from your caregiver how to quit. If you do not use tobacco, do not start.  Lung cancer screening is recommended for adults aged 55 80 years who are at high risk for developing lung cancer because of a history of smoking. Yearly low-dose computed tomography (CT) is recommended for people who have at least a 30-pack-year history of smoking and are a current smoker or have quit within the past 15 years. A pack year of smoking is smoking an average of 1 pack of cigarettes a day for 1 year (for example: 1 pack a day for 30 years or 2 packs a day for 15 years). Yearly screening should continue until the smoker has stopped smoking for at least 15 years. Yearly screening should also be stopped for people who develop a health problem that would prevent them from having lung cancer treatment.  If you are pregnant, do not drink alcohol. If you are breastfeeding, be very cautious about drinking alcohol. If you are not pregnant and choose to drink alcohol, do not exceed 1 drink per day. One drink is considered to be 12 ounces (355 mL) of beer, 5 ounces (148 mL) of wine, or 1.5 ounces (44 mL) of liquor.  Avoid use of street drugs. Do not share needles with anyone. Ask   for help if you need support or instructions about stopping the use of drugs.  High blood pressure causes heart disease and increases the risk of stroke. Blood pressure should be checked at least every 1 to 2 years. Ongoing high blood pressure should be treated with medicines, if weight loss and exercise are not effective.  If you are 55 to 71 years old, ask your caregiver if you should take aspirin to prevent strokes.  Diabetes screening involves taking a blood sample to check your fasting blood sugar  level. This should be done once every 3 years, after age 45, if you are within normal weight and without risk factors for diabetes. Testing should be considered at a younger age or be carried out more frequently if you are overweight and have at least 1 risk factor for diabetes.  Breast cancer screening is essential preventative care for women. You should practice "breast self-awareness." This means understanding the normal appearance and feel of your breasts and may include breast self-examination. Any changes detected, no matter how small, should be reported to a caregiver. Women in their 20s and 30s should have a clinical breast exam (CBE) by a caregiver as part of a regular health exam every 1 to 3 years. After age 40, women should have a CBE every year. Starting at age 40, women should consider having a mammogram (breast X-ray) every year. Women who have a family history of breast cancer should talk to their caregiver about genetic screening. Women at a high risk of breast cancer should talk to their caregiver about having an MRI and a mammogram every year.  Breast cancer gene (BRCA)-related cancer risk assessment is recommended for women who have family members with BRCA-related cancers. BRCA-related cancers include breast, ovarian, tubal, and peritoneal cancers. Having family members with these cancers may be associated with an increased risk for harmful changes (mutations) in the breast cancer genes BRCA1 and BRCA2. Results of the assessment will determine the need for genetic counseling and BRCA1 and BRCA2 testing.  The Pap test is a screening test for cervical cancer. Women should have a Pap test starting at age 21. Between ages 21 and 29, Pap tests should be repeated every 2 years. Beginning at age 30, you should have a Pap test every 3 years as long as the past 3 Pap tests have been normal. If you had a hysterectomy for a problem that was not cancer or a condition that could lead to cancer, then  you no longer need Pap tests. If you are between ages 65 and 70, and you have had normal Pap tests going back 10 years, you no longer need Pap tests. If you have had past treatment for cervical cancer or a condition that could lead to cancer, you need Pap tests and screening for cancer for at least 20 years after your treatment. If Pap tests have been discontinued, risk factors (such as a new sexual partner) need to be reassessed to determine if screening should be resumed. Some women have medical problems that increase the chance of getting cervical cancer. In these cases, your caregiver may recommend more frequent screening and Pap tests.  The human papillomavirus (HPV) test is an additional test that may be used for cervical cancer screening. The HPV test looks for the virus that can cause the cell changes on the cervix. The cells collected during the Pap test can be tested for HPV. The HPV test could be used to screen women aged 30 years and   older, and should be used in women of any age who have unclear Pap test results. After the age of 46, women should have HPV testing at the same frequency as a Pap test.  Colorectal cancer can be detected and often prevented. Most routine colorectal cancer screening begins at the age of 34 and continues through age 35. However, your caregiver may recommend screening at an earlier age if you have risk factors for colon cancer. On a yearly basis, your caregiver may provide home test kits to check for hidden blood in the stool. Use of a small camera at the end of a tube, to directly examine the colon (sigmoidoscopy or colonoscopy), can detect the earliest forms of colorectal cancer. Talk to your caregiver about this at age 68, when routine screening begins. Direct examination of the colon should be repeated every 5 to 10 years through age 15, unless early forms of pre-cancerous polyps or small growths are found.  Hepatitis C blood testing is recommended for all people born  from [birth year] through 1965 and any individual with known risks for hepatitis C.  Practice safe sex. Use condoms and avoid high-risk sexual practices to reduce the spread of sexually transmitted infections (STIs). Sexually active women aged 102 and younger should be checked for Chlamydia, which is a common sexually transmitted infection. Older women with new or multiple partners should also be tested for Chlamydia. Testing for other STIs is recommended if you are sexually active and at increased risk.  Osteoporosis is a disease in which the bones lose minerals and strength with aging. This can result in serious bone fractures. The risk of osteoporosis can be identified using a bone density scan. Women ages 82 and over and women at risk for fractures or osteoporosis should discuss screening with their caregivers. Ask your caregiver whether you should be taking a calcium supplement or vitamin D to reduce the rate of osteoporosis.  Menopause can be associated with physical symptoms and risks. Hormone replacement therapy is available to decrease symptoms and risks. You should talk to your caregiver about whether hormone replacement therapy is right for you.  Use sunscreen. Apply sunscreen liberally and repeatedly throughout the day. You should seek shade when your shadow is shorter than you. Protect yourself by wearing long sleeves, pants, a wide-brimmed hat, and sunglasses year round, whenever you are outdoors.  Notify your caregiver of new moles or changes in moles, especially if there is a change in shape or color. Also notify your caregiver if a mole is larger than the size of a pencil eraser.  Stay current with your immunizations. Document Released: 07/16/2010 Document Revised: 04/27/2012 Document Reviewed: 07/16/2010 [facility] Patient Information 2014 [place].

## 2013-10-02 LAB — URINALYSIS W MICROSCOPIC + REFLEX CULTURE
Bilirubin Urine: NEGATIVE
Casts: NONE SEEN
Crystals: NONE SEEN
Glucose, UA: NEGATIVE mg/dL
Hgb urine dipstick: NEGATIVE
Ketones, ur: NEGATIVE mg/dL
LEUKOCYTES UA: NEGATIVE
NITRITE: NEGATIVE
Protein, ur: NEGATIVE mg/dL
Specific Gravity, Urine: 1.029 (ref 1.005–1.030)
Urobilinogen, UA: 0.2 mg/dL (ref 0.0–1.0)
pH: 5.5 (ref 5.0–8.0)

## 2013-10-03 LAB — URINE CULTURE

## 2013-10-06 ENCOUNTER — Telehealth: Payer: Self-pay | Admitting: *Deleted

## 2013-10-06 NOTE — Telephone Encounter (Signed)
Pt called stating she has mammogram ordered on 11/23/13 requesting order. I left on her voicemail no order needed for mammogram screening she can schedule.  If breast problem the yes order will be needed, but she will need to see [name] here first. I told pt to call if questions.

## 2013-11-15 ENCOUNTER — Encounter: Payer: Self-pay | Admitting: Gynecology

## 2013-11-23 DIAGNOSIS — Z1231 Encounter for screening mammogram for malignant neoplasm of breast: Secondary | ICD-10-CM | POA: Diagnosis not present

## 2013-11-24 ENCOUNTER — Ambulatory Visit (INDEPENDENT_AMBULATORY_CARE_PROVIDER_SITE_OTHER): Payer: Medicare Other | Admitting: Family Medicine

## 2013-11-24 ENCOUNTER — Encounter: Payer: Self-pay | Admitting: Gynecology

## 2013-11-24 ENCOUNTER — Encounter: Payer: Self-pay | Admitting: Family Medicine

## 2013-11-24 VITALS — BP 126/74 | HR 72 | Temp 98.0°F | Wt 206.0 lb

## 2013-11-24 DIAGNOSIS — I10 Essential (primary) hypertension: Secondary | ICD-10-CM | POA: Diagnosis not present

## 2013-11-24 DIAGNOSIS — K219 Gastro-esophageal reflux disease without esophagitis: Secondary | ICD-10-CM | POA: Diagnosis not present

## 2013-11-24 DIAGNOSIS — G2581 Restless legs syndrome: Secondary | ICD-10-CM | POA: Diagnosis not present

## 2013-11-24 NOTE — Progress Notes (Signed)
Subjective:    Patient ID: Margaret Mitchell, female    DOB: 09-07-1940, 73 y.o.   MRN: [id number]  HPI Patient seen for routine medical follow-up. Chronic problems include hypertension, restless leg syndrome, remote history of primary hyperparathyroidism, degenerative arthritis. Blood pressures been well controlled by home readings. No dizziness. Compliant with therapy. She has some rest leg syndrome but declines further therapy. Minimal caffeine consumption and no hx of known anemia. BP has been well controlled.    GERD stable.  She reduced her Nexium to once daily and symptoms have been stable.  . Last colonoscopy 2007 with melanosis coli but no polyps.  Past Medical History  Diagnosis Date  . Allergy, unspecified not elsewhere classified   . IBS (irritable bowel syndrome)   . Headache(784.0)   . Pelvic adhesions   . Pelvic pain in female   . Sinus infection   . Anxiety   . Hypertension   . Osteoporosis   . Thyroid disease     Parathyroid cyst  . Cervical dysplasia     prior to hysterectomy   Past Surgical History  Procedure Laterality Date  . Parathyroid adenoma removed    . Vesicovaginal fistula closure w/ tah    . Tonsillectomy    . Appendectomy    . Tubal ligation  1981  . Rotator cuff repair      X2  . Foot surgery      RIGHT AND LEFT  . Colposcopy    . Oophorectomy      BSO  . Cataract extraction  2013  . Shoulder injection  10/2011  . Abdominal hysterectomy  1983    TAH/BSO for pelvic pain and adhesions    reports that she has never smoked. She has never used smokeless tobacco. She reports that she does not drink alcohol or use illicit drugs. family history includes Cancer in her brother and father; Diabetes in her maternal aunt, maternal grandmother, and mother; Heart disease in her maternal grandmother; Heart failure in her paternal grandmother; Hypertension in her maternal grandmother; Lung cancer in her father; Stroke in her maternal grandmother. Allergies   Allergen Reactions  . Prednisone     REACTION: heart palpatations  In larger doses  . Rofecoxib     REACTION: mouth swelling (VIOXX)  . Sulfonamide Derivatives     REACTION: pt unable to remember  . Terfenadine     SELDANE      Review of Systems  Constitutional: Negative for fatigue.  Eyes: Negative for visual disturbance.  Respiratory: Negative for cough, chest tightness, shortness of breath and wheezing.   Cardiovascular: Negative for chest pain, palpitations and leg swelling.  Neurological: Negative for dizziness, seizures, syncope, weakness, light-headedness and headaches.       Objective:   Physical Exam  Constitutional: She appears well-developed and well-nourished.  Cardiovascular: Normal rate and regular rhythm.  Exam reveals no gallop.   Pulmonary/Chest: Effort normal and breath sounds normal. No respiratory distress. She has no wheezes. She has no rales.  Musculoskeletal: She exhibits no edema.          Assessment & Plan:  #1 hypertension. Stable by home readings. Continue current medications. Routine follow-up 6 months.  Labs then.  #2 restless leg syndrome. She declines further therapy. Consider Requip or Mirapex if symptoms progress. Avoid excessive caffeine use  #3 GERD which is stable on Nexium once daily. She reduced to once daily since last visit.

## 2013-11-24 NOTE — Progress Notes (Signed)
Pre visit review using our clinic review tool, if applicable. No additional management support is needed unless otherwise documented below in the visit note. 

## 2013-11-25 ENCOUNTER — Ambulatory Visit: Payer: Medicare Other | Admitting: Family Medicine

## 2013-11-29 ENCOUNTER — Telehealth: Payer: Self-pay | Admitting: Family Medicine

## 2013-11-29 NOTE — Telephone Encounter (Signed)
emmi emailed °

## 2013-12-07 ENCOUNTER — Ambulatory Visit (INDEPENDENT_AMBULATORY_CARE_PROVIDER_SITE_OTHER): Payer: Medicare Other | Admitting: Gynecology

## 2013-12-07 ENCOUNTER — Encounter: Payer: Self-pay | Admitting: Gynecology

## 2013-12-07 DIAGNOSIS — G8929 Other chronic pain: Secondary | ICD-10-CM

## 2013-12-07 DIAGNOSIS — R1031 Right lower quadrant pain: Secondary | ICD-10-CM

## 2013-12-07 LAB — URINALYSIS W MICROSCOPIC + REFLEX CULTURE
Bilirubin Urine: NEGATIVE
Glucose, UA: NEGATIVE mg/dL
Hgb urine dipstick: NEGATIVE
Ketones, ur: NEGATIVE mg/dL
LEUKOCYTES UA: NEGATIVE
Nitrite: NEGATIVE
Protein, ur: NEGATIVE mg/dL
SPECIFIC GRAVITY, URINE: 1.015 (ref 1.005–1.030)
UROBILINOGEN UA: 0.2 mg/dL (ref 0.0–1.0)
pH: 6 (ref 5.0–8.0)

## 2013-12-07 NOTE — Addendum Note (Signed)
Addended by: Nelva Nay on: 12/07/2013 02:13 PM   Modules accepted: Orders

## 2013-12-07 NOTE — Progress Notes (Signed)
Margaret Mitchell [birth year] [id number]        73 y.o.  G1P0010 presents with 6 days of nagging right lower quadrant pain. Feels a pulling sensation and sometimes sharp stabbing if she sneezes or coughs. No nausea, vomiting, diarrhea or significant constipation. Having regular bowel movements. No issues with voiding such as dysuria, urgency or frequency. Status post TAH/BSO as well as appendectomy in the past.  Past medical history,surgical history, problem list, medications, allergies, family history and social history were all reviewed and documented in the EPIC chart.  Directed ROS with pertinent positives and negatives documented in the history of present illness/assessment and plan.  Exam: [name] assistant General appearance:  Normal Spine straight without CVA tenderness Abdomen soft with mild tenderness right lower quadrant to deep palpation. No masses guarding rebound organomegaly. Pelvic external BUS vagina with atrophic changes. Bimanual without masses or tenderness. Rectal exam is normal.  Assessment/Plan:  73 y.o. G1P0010 nagging right lower quadrant pain without more acute concerning symptoms such as nausea vomiting fever or chills. Status post TAH/BSO appendectomy in the past. Reviewed differential to include GI, adhesions, diverticulosis/diverticulitis. Recommend observation at present. If continues then follow up with her gastroenterologist.  More acute signs and symptoms to warrant emergency room evaluation reviewed with her as we are coming into the Thanksgiving Day holiday. Nausea vomiting fever chills or worsening pain would warrant more emergent evaluation and further studies.  Urinalysis is negative today. We'll hold on any other studies at this point as I do not think it's gynecologic and will defer to gastroenterology if she continues to have pain.     Anastasio Auerbach MD, 11:26 AM 12/07/2013

## 2013-12-07 NOTE — Patient Instructions (Signed)
Follow up with gastroenterology if your pain continues. More concerning signs include nausea/vomiting, fevers and chills. If your symptoms worsen then I would recommend being evaluated at the emergency room over the weekend.

## 2013-12-29 ENCOUNTER — Emergency Department (HOSPITAL_COMMUNITY)
Admission: EM | Admit: 2013-12-29 | Discharge: 2013-12-29 | Disposition: A | Discharge status: Home / Self Care | Payer: Medicare Other | Attending: Emergency Medicine | Admitting: Emergency Medicine

## 2013-12-29 ENCOUNTER — Telehealth: Payer: Self-pay

## 2013-12-29 ENCOUNTER — Encounter (HOSPITAL_COMMUNITY): Payer: Self-pay | Admitting: *Deleted

## 2013-12-29 ENCOUNTER — Emergency Department (HOSPITAL_COMMUNITY): Payer: Medicare Other

## 2013-12-29 DIAGNOSIS — Z8659 Personal history of other mental and behavioral disorders: Secondary | ICD-10-CM | POA: Diagnosis not present

## 2013-12-29 DIAGNOSIS — E039 Hypothyroidism, unspecified: Secondary | ICD-10-CM | POA: Insufficient documentation

## 2013-12-29 DIAGNOSIS — N200 Calculus of kidney: Secondary | ICD-10-CM | POA: Diagnosis not present

## 2013-12-29 DIAGNOSIS — R109 Unspecified abdominal pain: Secondary | ICD-10-CM | POA: Diagnosis not present

## 2013-12-29 DIAGNOSIS — I10 Essential (primary) hypertension: Secondary | ICD-10-CM | POA: Diagnosis not present

## 2013-12-29 DIAGNOSIS — Z7982 Long term (current) use of aspirin: Secondary | ICD-10-CM | POA: Diagnosis not present

## 2013-12-29 DIAGNOSIS — R079 Chest pain, unspecified: Secondary | ICD-10-CM | POA: Diagnosis not present

## 2013-12-29 DIAGNOSIS — K589 Irritable bowel syndrome without diarrhea: Secondary | ICD-10-CM | POA: Insufficient documentation

## 2013-12-29 DIAGNOSIS — R52 Pain, unspecified: Secondary | ICD-10-CM | POA: Diagnosis not present

## 2013-12-29 DIAGNOSIS — R1032 Left lower quadrant pain: Secondary | ICD-10-CM | POA: Diagnosis not present

## 2013-12-29 DIAGNOSIS — Z79899 Other long term (current) drug therapy: Secondary | ICD-10-CM | POA: Diagnosis not present

## 2013-12-29 DIAGNOSIS — N281 Cyst of kidney, acquired: Secondary | ICD-10-CM | POA: Diagnosis not present

## 2013-12-29 LAB — CBC WITH DIFFERENTIAL/PLATELET
BASOS ABS: 0 10*3/uL (ref 0.0–0.1)
Basophils Relative: 0 % (ref 0–1)
EOS PCT: 1 % (ref 0–5)
Eosinophils Absolute: 0.1 10*3/uL (ref 0.0–0.7)
HEMATOCRIT: 38.5 % (ref 36.0–46.0)
Hemoglobin: 12.8 g/dL (ref 12.0–15.0)
Lymphocytes Relative: 42 % (ref 12–46)
Lymphs Abs: 3.9 10*3/uL (ref 0.7–4.0)
MCH: 29.8 pg (ref 26.0–34.0)
MCHC: 33.2 g/dL (ref 30.0–36.0)
MCV: 89.5 fL (ref 78.0–100.0)
MONO ABS: 1 10*3/uL (ref 0.1–1.0)
Monocytes Relative: 11 % (ref 3–12)
Neutro Abs: 4.3 10*3/uL (ref 1.7–7.7)
Neutrophils Relative %: 46 % (ref 43–77)
Platelets: 277 10*3/uL (ref 150–400)
RBC: 4.3 MIL/uL (ref 3.87–5.11)
RDW: 12.7 % (ref 11.5–15.5)
WBC: 9.3 10*3/uL (ref 4.0–10.5)

## 2013-12-29 LAB — URINALYSIS, ROUTINE W REFLEX MICROSCOPIC
BILIRUBIN URINE: NEGATIVE
GLUCOSE, UA: NEGATIVE mg/dL
HGB URINE DIPSTICK: NEGATIVE
Ketones, ur: NEGATIVE mg/dL
Leukocytes, UA: NEGATIVE
Nitrite: NEGATIVE
Protein, ur: NEGATIVE mg/dL
Specific Gravity, Urine: 1.01 (ref 1.005–1.030)
UROBILINOGEN UA: 0.2 mg/dL (ref 0.0–1.0)
pH: 6 (ref 5.0–8.0)

## 2013-12-29 LAB — COMPREHENSIVE METABOLIC PANEL
ALK PHOS: 103 U/L (ref 39–117)
ALT: 18 U/L (ref 0–35)
ANION GAP: 14 (ref 5–15)
AST: 23 U/L (ref 0–37)
Albumin: 3.8 g/dL (ref 3.5–5.2)
BILIRUBIN TOTAL: 0.3 mg/dL (ref 0.3–1.2)
BUN: 14 mg/dL (ref 6–23)
CO2: 24 mEq/L (ref 19–32)
Calcium: 9 mg/dL (ref 8.4–10.5)
Chloride: 100 mEq/L (ref 96–112)
Creatinine, Ser: 0.83 mg/dL (ref 0.50–1.10)
GFR calc non Af Amer: 69 mL/min — ABNORMAL LOW (ref >90)
GFR, EST AFRICAN AMERICAN: 80 mL/min — AB (ref >90)
GLUCOSE: 92 mg/dL (ref 70–99)
Potassium: 3.3 mEq/L — ABNORMAL LOW (ref 3.7–5.3)
Sodium: 138 mEq/L (ref 137–147)
Total Protein: 7.7 g/dL (ref 6.0–8.3)

## 2013-12-29 LAB — LIPASE, BLOOD: Lipase: 31 U/L (ref 11–59)

## 2013-12-29 LAB — I-STAT TROPONIN, ED: TROPONIN I, POC: 0 ng/mL (ref 0.00–0.08)

## 2013-12-29 MED ORDER — HYDROCODONE-ACETAMINOPHEN 5-325 MG PO TABS: 2.0000 | ORAL_TABLET | ORAL | Status: DC | PRN

## 2013-12-29 MED ORDER — FAMCICLOVIR 500 MG PO TABS: 500.0000 mg | ORAL_TABLET | Freq: Three times a day (TID) | ORAL | Status: DC

## 2013-12-29 NOTE — ED Notes (Signed)
Pt in via EMS c/o left flank pain that has been intermittent since 10am today, denies other symptoms with this, no distress noted, hypertensive upon arrival but that normalized en route.

## 2013-12-29 NOTE — ED Provider Notes (Signed)
CSN: [id number]     Arrival date & time 12/29/13  1636 History   First MD Initiated Contact with Patient 12/29/13 1642     Chief Complaint  Patient presents with  . Flank Pain      HPI Pt in via EMS c/o left flank pain that has been intermittent since 10am today, denies other symptoms with this, no distress noted, hypertensive upon arrival but that normalized en route Past Medical History  Diagnosis Date  . Allergy, unspecified not elsewhere classified   . IBS (irritable bowel syndrome)   . Headache(784.0)   . Pelvic adhesions   . Pelvic pain in female   . Sinus infection   . Anxiety   . Hypertension   . Osteoporosis   . Thyroid disease     Parathyroid cyst  . Cervical dysplasia     prior to hysterectomy   Past Surgical History  Procedure Laterality Date  . Parathyroid adenoma removed    . Vesicovaginal fistula closure w/ tah    . Tonsillectomy    . Appendectomy    . Tubal ligation  1981  . Rotator cuff repair      X2  . Foot surgery      RIGHT AND LEFT  . Colposcopy    . Oophorectomy      BSO  . Cataract extraction  2013  . Shoulder injection  10/2011  . Abdominal hysterectomy  1983    TAH/BSO for pelvic pain and adhesions   Family History  Problem Relation Age of Onset  . Diabetes Mother   . Cancer Father     LUNG  . Lung cancer Father   . Cancer Brother     LEUKEMIA  . Hypertension Maternal Grandmother   . Heart disease Maternal Grandmother   . Diabetes Maternal Grandmother   . Stroke Maternal Grandmother   . Diabetes Maternal Aunt   . Heart failure Paternal Grandmother    History  Substance Use Topics  . Smoking status: Never Smoker   . Smokeless tobacco: Never Used  . Alcohol Use: No   OB History    Gravida Para Term Preterm AB TAB SAB Ectopic Multiple Living   1    1  1    0     Review of Systems  All other systems reviewed and are negative  Allergies  Prednisone; Rofecoxib; Sulfonamide derivatives; and Terfenadine  Home Medications    Prior to Admission medications   Medication Sig Start Date End Date Taking? Authorizing Provider  amLODipine-benazepril (LOTREL) 5-20 MG per capsule Take 1 capsule by mouth daily. 09/10/13  Yes Eulas Post, MD  aspirin 81 MG tablet Take 81 mg by mouth daily.     Yes Historical Provider, MD  cholecalciferol (VITAMIN D) 1000 UNITS tablet Take 1,000 Units by mouth daily.   Yes Historical Provider, MD  esomeprazole (NEXIUM) 40 MG capsule TAKE 1 CAPSULE BY MOUTH TWICE  A DAY 30 MINUTES BEFORE MEALS Patient taking differently: Take 40 mg by mouth. TAKE 1 CAPSULE BY MOUTH once daily 04/29/13  Yes Noralee Space, MD  metoprolol succinate (TOPROL-XL) 100 MG 24 hr tablet TAKE ONE-HALF (1/2) TABLET DAILY 03/23/13  Yes Noralee Space, MD  senna-docusate (SENOKOT-S) 8.6-50 MG per tablet Take 1 tablet by mouth daily.   Yes Historical Provider, MD  famciclovir (FAMVIR) 500 MG tablet Take 1 tablet (500 mg total) by mouth 3 (three) times daily. 12/30/13   Hoy Morn, MD  HYDROcodone-acetaminophen (NORCO/VICODIN) 5-325 MG  per tablet Take 2 tablets by mouth every 4 (four) hours as needed for moderate pain or severe pain. 12/29/13   Dot Lanes, MD   BP 147/66 mmHg  Pulse 66  Temp(Src) 98.3 F (36.8 C) (Oral)  Resp 15  SpO2 97% Physical Exam Physical Exam  Nursing note and vitals reviewed. Constitutional: She is oriented to person, place, and time. She appears well-developed and well-nourished. No distress.  HENT:  Head: Normocephalic and atraumatic.  Eyes: Pupils are equal, round, and reactive to light.  Neck: Normal range of motion.  Cardiovascular: Normal rate and intact distal pulses.   Pulmonary/Chest: No respiratory distress.  Abdominal: Normal appearance. She exhibits no distension.  Musculoskeletal: Normal range of motion.  Neurological: She is alert and oriented to person, place, and time. No cranial nerve deficit.  Skin: Skin is warm and dry. No rash noted.  Psychiatric: She has a  normal mood and affect. Her behavior is normal.   ED Course  Procedures (including critical care time) Labs Review Labs Reviewed  COMPREHENSIVE METABOLIC PANEL - Abnormal; Notable for the following:    Potassium 3.3 (*)    GFR calc non Af Amer 69 (*)    GFR calc Af Amer 80 (*)    All other components within normal limits  CBC WITH DIFFERENTIAL  LIPASE, BLOOD  URINALYSIS, ROUTINE W REFLEX MICROSCOPIC  I-STAT TROPOININ, ED    Imaging Review CT Abd/Pelvis  IMPRESSION: 1. No specific findings to explain the patient's history of left flank pain. 2. 2 mm nonobstructing stone is seen in the lower pole the right kidney. Small bilateral probable renal cyst. No stones or secondary changes in the left kidney or ureter.  EKG Interpretation   Date/Time:  Wednesday December 29 2013 16:49:56 EST Ventricular Rate:  73 PR Interval:  142 QRS Duration: 86 QT Interval:  438 QTC Calculation: 483 R Axis:   72 Text Interpretation:  Sinus rhythm Normal ECG Confirmed by Latasha Buczkowski  MD,  [place] ([id number]) on 12/29/2013 4:54:21 PM     After treatment in the ED the patient feels back to baseline and wants to go home. MDM   Final diagnoses:  Left flank discomfort        Dot Lanes, MD 01/01/14 [phone]

## 2013-12-29 NOTE — ED Notes (Signed)
Pt returned from xray.  Resting comfortably in bed

## 2013-12-29 NOTE — Telephone Encounter (Signed)
Margaret Mitchell - Client [place] Patient Name: Margaret Mitchell Gender: Female DOB: 1940/02/20 Age: 73 Y 79 M 13 D Return Phone Number: [phone] (Primary), [phone] (Secondary) Address: [address] City/State/Zip: [place] [address] Client [facility] - Client Client Site  [facility] Physician Carolann Littler Contact Type Call Call Type Triage / Clinical Relationship To Patient Self Return Phone Number [phone] (Primary) Chief Complaint CHEST PAIN (>=21 years) - pain, pressure, heaviness or tightness Initial Comment Caller states has pain behind left breast area. [place] Not Listed Call 911 PreDisposition Go to ED Nurse Assessment Nurse: Venetia Maxon, RN, Manuela Schwartz Date/Time (Eastern Time): 12/29/2013 3:52:14 PM Confirm and document reason for call. If symptomatic, describe symptoms. ---Caller states has pain behind left breast area. She has HTN she states the pain began at 10:15 am under the left breast and has happened again now. She has no other symptoms. no fever. Has the patient traveled out of the country within the last 30 days? ---No Does the patient require triage? ---Yes Related visit to physician within the last 2 weeks? ---No Does the PT have any chronic conditions? (i.e. diabetes, asthma, etc.) ---Yes List chronic conditions. ---HTN reflux Guidelines Guideline Title Affirmed Question Affirmed Notes Nurse Date/Time Eilene Ghazi Time) Chest Pain [1] Chest pain lasts > 5 minutes AND [2] age > 30 AND [3] at least one cardiac risk factor (i.e., hypertension, diabetes, obesity, smoker or strong family history of heart disease) Venetia Maxon, RN, Manuela Schwartz 12/29/2013 3:53:57 PM Disp. Time Eilene Ghazi Time) Disposition Final User 12/29/2013 3:50:09 PM Send to Urgent Forestine Na 12/29/2013 3:56:13 PM Send To RN Personal Venetia Maxon, RN,  Manuela Schwartz 12/29/2013 3:58:57 PM 911 Follow Up Call Attempted Venetia Maxon, RN, Manuela Schwartz Reason: call went to voice mail PLEASE NOTE: All timestamps contained within this report are represented as [place] Standard Time. CONFIDENTIALTY NOTICE: This fax transmission is intended only for the addressee. It contains information that is legally privileged, confidential or otherwise protected from use or disclosure. If you are not the intended recipient, you are strictly prohibited from reviewing, disclosing, copying using or disseminating any of this information or taking any action in reliance on or regarding this information. If you have received this fax in error, please notify us immediately by telephone so that we can arrange for its return to [name]. Phone: [phone], Toll-Free: [phone], Fax: [phone] Page: 2 of 2 Call Id: [id number] 12/29/2013 3:55:27 PM Call EMS 911 Now Yes Venetia Maxon, RN, Edwena Bunde Understands: Yes Disagree/Comply: Comply Care Advice Given Per Guideline CALL EMS 911 NOW: Immediate medical attention is needed. You need to hang up and call 911 (or an ambulance). Psychologist, forensic Discretion: I'll call you back in a few minutes to be sure you were able to reach them.) CARE ADVICE given per Chest Pain (Adult) guideline. After Care Instructions Given Call Event Type User Date / Time Description  Pt is currently admitted

## 2013-12-29 NOTE — ED Notes (Signed)
Patient transported to X-ray 

## 2013-12-29 NOTE — Discharge Instructions (Signed)
Flank Pain °Flank pain refers to pain that is located on the side of the body between the upper abdomen and the back. The pain may occur over a short period of time (acute) or may be long-term or reoccurring (chronic). It may be mild or severe. Flank pain can be caused by many things. °CAUSES  °Some of the more common causes of flank pain include: °· Muscle strains.   °· Muscle spasms.   °· A disease of your spine (vertebral disk disease).   °· A lung infection (pneumonia).   °· Fluid around your lungs (pulmonary edema).   °· A kidney infection.   °· Kidney stones.   °· A very painful skin rash caused by the chickenpox virus (shingles).   °· Gallbladder disease.   °HOME CARE INSTRUCTIONS  °Home care will depend on the cause of your pain. In general, °· Rest as directed by your caregiver. °· Drink enough fluids to keep your urine clear or pale yellow. °· Only take over-the-counter or prescription medicines as directed by your caregiver. Some medicines may help relieve the pain. °· Tell your caregiver about any changes in your pain. °· Follow up with your caregiver as directed. °SEEK IMMEDIATE MEDICAL CARE IF:  °· Your pain is not controlled with medicine.   °· You have new or worsening symptoms. °· Your pain increases.   °· You have abdominal pain.   °· You have shortness of breath.   °· You have persistent nausea or vomiting.   °· You have swelling in your abdomen.   °· You feel faint or pass out.   °· You have blood in your urine. °· You have a fever or persistent symptoms for more than 2-3 days. °· You have a fever and your symptoms suddenly get worse. °MAKE SURE YOU:  °· Understand these instructions. °· Will watch your condition. °· Will get help right away if you are not doing well or get worse. °Document Released: 02/21/2005 Document Revised: 09/25/2011 Document Reviewed: 08/15/2011 °ExitCare® Patient Information ©2015 [employer]. This information is not intended to replace advice given to you by your  health care provider. Make sure you discuss any questions you have with your health care provider. ° °

## 2013-12-30 MED ORDER — FAMCICLOVIR 500 MG PO TABS: 500.0000 mg | ORAL_TABLET | Freq: Three times a day (TID) | ORAL | Status: DC

## 2014-01-10 ENCOUNTER — Encounter: Payer: Self-pay | Admitting: Family Medicine

## 2014-01-10 ENCOUNTER — Ambulatory Visit (INDEPENDENT_AMBULATORY_CARE_PROVIDER_SITE_OTHER): Payer: Medicare Other | Admitting: Family Medicine

## 2014-01-10 VITALS — BP 130/80 | HR 74 | Temp 97.6°F | Wt 206.0 lb

## 2014-01-10 DIAGNOSIS — R109 Unspecified abdominal pain: Secondary | ICD-10-CM | POA: Diagnosis not present

## 2014-01-10 DIAGNOSIS — E876 Hypokalemia: Secondary | ICD-10-CM

## 2014-01-10 LAB — BASIC METABOLIC PANEL
BUN: 17 mg/dL (ref 6–23)
CHLORIDE: 107 meq/L (ref 96–112)
CO2: 28 mEq/L (ref 19–32)
Calcium: 9.2 mg/dL (ref 8.4–10.5)
Creatinine, Ser: 0.8 mg/dL (ref 0.4–1.2)
GFR: 97.78 mL/min (ref >60.00)
Glucose, Bld: 86 mg/dL (ref 70–99)
POTASSIUM: 4.4 meq/L (ref 3.5–5.1)
SODIUM: 142 meq/L (ref 135–145)

## 2014-01-10 NOTE — Progress Notes (Signed)
Subjective:    Patient ID: Margaret Mitchell, female    DOB: 03-11-1940, 73 y.o.   MRN: [id number]  HPI Patient seen for hospital follow-up. She was seen on 12/29/2013 in ED with left flank pain and pain behind her left breast. She had onset at rest while at work earlier that day. She had not had any exertional chest pains. She denied any skin rash. No dyspnea. No pleuritic pain. She denied fever or chills. She had evaluation including multiple labs which were significant only for potassium 3.3. Troponins and lipase were negative. She had CT abdomen and pelvis which showed 2 mm nonobstructing stone lower pole right kidney and small bilateral renal cyst but no acute findings. No stones or secondary changes in the left kidney or ureter.  Patient was actually discharged with hydrocodone and Famvir and instructed to start this if she had any rashes but she didn't develop any shingles type rash. She has no pain at this point. No appetite or weight changes. She was seen recently by gynecologist. She's had previous hysterectomy. No prior history of hypokalemia. No diuretic use.  Reviewed with no changes:  Past Medical History  Diagnosis Date  . Allergy, unspecified not elsewhere classified   . IBS (irritable bowel syndrome)   . Headache(784.0)   . Pelvic adhesions   . Pelvic pain in female   . Sinus infection   . Anxiety   . Hypertension   . Osteoporosis   . Thyroid disease     Parathyroid cyst  . Cervical dysplasia     prior to hysterectomy   Past Surgical History  Procedure Laterality Date  . Parathyroid adenoma removed    . Vesicovaginal fistula closure w/ tah    . Tonsillectomy    . Appendectomy    . Tubal ligation  1981  . Rotator cuff repair      X2  . Foot surgery      RIGHT AND LEFT  . Colposcopy    . Oophorectomy      BSO  . Cataract extraction  2013  . Shoulder injection  10/2011  . Abdominal hysterectomy  1983    TAH/BSO for pelvic pain and adhesions    reports that  she has never smoked. She has never used smokeless tobacco. She reports that she does not drink alcohol or use illicit drugs. family history includes Cancer in her brother and father; Diabetes in her maternal aunt, maternal grandmother, and mother; Heart disease in her maternal grandmother; Heart failure in her paternal grandmother; Hypertension in her maternal grandmother; Lung cancer in her father; Stroke in her maternal grandmother. Allergies  Allergen Reactions  . Prednisone     REACTION: heart palpatations  In larger doses  . Rofecoxib     REACTION: mouth swelling (VIOXX)  . Sulfonamide Derivatives     REACTION: pt unable to remember  . Terfenadine     SELDANE      Review of Systems  Constitutional: Negative for fever, chills, appetite change and unexpected weight change.  Respiratory: Negative for cough and shortness of breath.   Cardiovascular: Negative for chest pain.  Gastrointestinal: Negative for nausea, vomiting, abdominal pain, diarrhea, constipation and blood in stool.  Genitourinary: Negative for dysuria.  Skin: Negative for rash.  Neurological: Negative for dizziness.  Hematological: Negative for adenopathy. Does not bruise/bleed easily.       Objective:   Physical Exam  Constitutional: She appears well-developed and well-nourished.  Neck: Neck supple.  Cardiovascular: Normal rate  and regular rhythm.   Pulmonary/Chest: Effort normal and breath sounds normal. No respiratory distress. She has no wheezes. She has no rales.  Abdominal: Soft. Bowel sounds are normal. She exhibits no distension and no mass. There is no tenderness. There is no rebound and no guarding.  Musculoskeletal: She exhibits no edema.  Skin: No rash noted.          Assessment & Plan:  #1 recent left flank pain. Incidental 2 mm right kidney stone lower pole of kidney and this was incidental. No findings to explain left flank pain. Pain resolved at this time. Suspect this was musculoskeletal.  Follow-up for any recurrence #2 mild hypokalemia with recent potassium 3.3. Recheck basic metabolic panel. Handout given on high potassium foods.

## 2014-01-10 NOTE — Progress Notes (Signed)
Pre visit review using our clinic review tool, if applicable. No additional management support is needed unless otherwise documented below in the visit note. 

## 2014-01-10 NOTE — Patient Instructions (Signed)
Potassium Content of Foods  Potassium is a mineral found in many foods and drinks. It helps keep fluids and minerals balanced in your body and affects how steadily your heart beats. Potassium also helps control your blood pressure and keep your muscles and nervous system healthy.  Certain health conditions and medicines may change the balance of potassium in your body. When this happens, you can help balance your level of potassium through the foods that you do or do not eat. Your health care provider or dietitian may recommend an amount of potassium that you should have each day. The following lists of foods provide the amount of potassium (in parentheses) per serving in each item.  HIGH IN POTASSIUM   The following foods and beverages have 200 mg or more of potassium per serving:  · Apricots, 2 raw or 5 dry (200 mg).  · Artichoke, 1 medium (345 mg).  · Avocado, raw,  ¼ each (245 mg).  · Banana, 1 medium (425 mg).  · Beans, lima, or baked beans, canned, ½ cup (280 mg).  · Beans, white, canned, ½ cup (595 mg).  · Beef roast, 3 oz (320 mg).  · Beef, ground, 3 oz (270 mg).  · Beets, raw or cooked, ½ cup (260 mg).  · Bran muffin, 2 oz (300 mg).  · Broccoli, ½ cup (230 mg).  · Brussels sprouts, ½ cup (250 mg).  · Cantaloupe, ½ cup (215 mg).  · Cereal, 100% bran, ½ cup (200-400 mg).  · Cheeseburger, single, fast food, 1 each (225-400 mg).  · Chicken, 3 oz (220 mg).  · Clams, canned, 3 oz (535 mg).  · Crab, 3 oz (225 mg).  · Dates, 5 each (270 mg).  · Dried beans and peas, ½ cup (300-475 mg).  · Figs, dried, 2 each (260 mg).  · Fish: halibut, tuna, cod, snapper, 3 oz (480 mg).  · Fish: salmon, haddock, swordfish, perch, 3 oz (300 mg).  · Fish, tuna, canned 3 oz (200 mg).  · French fries, fast food, 3 oz (470 mg).  · Granola with fruit and nuts, ½ cup (200 mg).  · Grapefruit juice, ½ cup (200 mg).  · Greens, beet, ½ cup (655 mg).  · Honeydew melon, ½ cup (200 mg).  · Kale, raw, 1 cup (300 mg).  · Kiwi, 1 medium (240  mg).  · Kohlrabi, rutabaga, parsnips, ½ cup (280 mg).  · Lentils, ½ cup (365 mg).  · Mango, 1 each (325 mg).  · Milk, chocolate, 1 cup (420 mg).  · Milk: nonfat, low-fat, whole, buttermilk, 1 cup (350-380 mg).  · Molasses, 1 Tbsp (295 mg).  · Mushrooms, ½ cup (280) mg.  · Nectarine, 1 each (275 mg).  · Nuts: almonds, peanuts, hazelnuts, [place], cashew, mixed, 1 oz (200 mg).  · Nuts, pistachios, 1 oz (295 mg).  · Orange, 1 each (240 mg).  · Orange juice, ½ cup (235 mg).  · Papaya, medium, ½ fruit (390 mg).  · Peanut butter, chunky, 2 Tbsp (240 mg).  · Peanut butter, smooth, 2 Tbsp (210 mg).  · Pear, 1 medium (200 mg).  · Pomegranate, 1 whole (400 mg).  · Pomegranate juice, ½ cup (215 mg).  · Pork, 3 oz (350 mg).  · Potato chips, salted, 1 oz (465 mg).  · Potato, baked with skin, 1 medium (925 mg).  · Potatoes, boiled, ½ cup (255 mg).  · Potatoes, mashed, ½ cup (330 mg).  · Prune juice, ½ cup (  370 mg).  · Prunes, 5 each (305 mg).  · Pudding, chocolate, ½ cup (230 mg).  · Pumpkin, canned, ½ cup (250 mg).  · Raisins, seedless, ¼ cup (270 mg).  · Seeds, sunflower or pumpkin, 1 oz (240 mg).  · Soy milk, 1 cup (300 mg).  · Spinach, ½ cup (420 mg).  · Spinach, canned, ½ cup (370 mg).  · Sweet potato, baked with skin, 1 medium (450 mg).  · Swiss chard, ½ cup (480 mg).  · Tomato or vegetable juice, ½ cup (275 mg).  · Tomato sauce or puree, ½ cup (400-550 mg).  · Tomato, raw, 1 medium (290 mg).  · Tomatoes, canned, ½ cup (200-300 mg).  · Turkey, 3 oz (250 mg).  · Wheat germ, 1 oz (250 mg).  · Winter squash, ½ cup (250 mg).  · Yogurt, plain or fruited, 6 oz (260-435 mg).  · Zucchini, ½ cup (220 mg).  MODERATE IN POTASSIUM  The following foods and beverages have 50-200 mg of potassium per serving:  · Apple, 1 each (150 mg).  · Apple juice, ½ cup (150 mg).  · Applesauce, ½ cup (90 mg).  · Apricot nectar, ½ cup (140 mg).  · Asparagus, small spears, ½ cup or 6 spears (155 mg).  · Bagel, cinnamon raisin, 1 each (130 mg).  · Bagel,  egg or plain, 4 in., 1 each (70 mg).  · Beans, green, ½ cup (90 mg).  · Beans, yellow, ½ cup (190 mg).  · Beer, regular, 12 oz (100 mg).  · Beets, canned, ½ cup (125 mg).  · Blackberries, ½ cup (115 mg).  · Blueberries, ½ cup (60 mg).  · Bread, whole wheat, 1 slice (70 mg).  · Broccoli, raw, ½ cup (145 mg).  · Cabbage, ½ cup (150 mg).  · Carrots, cooked or raw, ½ cup (180 mg).  · Cauliflower, raw, ½ cup (150 mg).  · Celery, raw, ½ cup (155 mg).  · Cereal, bran flakes, ½cup (120-150 mg).  · Cheese, cottage, ½ cup (110 mg).  · Cherries, 10 each (150 mg).  · Chocolate, 1½ oz bar (165 mg).  · Coffee, brewed 6 oz (90 mg).  · Corn, ½ cup or 1 ear (195 mg).  · Cucumbers, ½ cup (80 mg).  · Egg, large, 1 each (60 mg).  · Eggplant, ½ cup (60 mg).  · Endive, raw, ½cup (80 mg).  · English muffin, 1 each (65 mg).  · Fish, orange roughy, 3 oz (150 mg).  · Frankfurter, beef or pork, 1 each (75 mg).  · Fruit cocktail, ½ cup (115 mg).  · Grape juice, ½ cup (170 mg).  · Grapefruit, ½ fruit (175 mg).  · Grapes, ½ cup (155 mg).  · Greens: kale, turnip, collard, ½ cup (110-150 mg).  · Ice cream or frozen yogurt, chocolate, ½ cup (175 mg).  · Ice cream or frozen yogurt, vanilla, ½ cup (120-150 mg).  · Lemons, limes, 1 each (80 mg).  · Lettuce, all types, 1 cup (100 mg).  · Mixed vegetables, ½ cup (150 mg).  · Mushrooms, raw, ½ cup (110 mg).  · Nuts: walnuts, pecans, or macadamia, 1 oz (125 mg).  · Oatmeal, ½ cup (80 mg).  · Okra, ½ cup (110 mg).  · Onions, raw, ½ cup (120 mg).  · Peach, 1 each (185 mg).  · Peaches, canned, ½ cup (120 mg).  · Pears, canned, ½ cup (120 mg).  · Peas, green,   frozen, ½ cup (90 mg).  · Peppers, green, ½ cup (130 mg).  · Peppers, red, ½ cup (160 mg).  · Pineapple juice, ½ cup (165 mg).  · Pineapple, fresh or canned, ½ cup (100 mg).  · Plums, 1 each (105 mg).  · Pudding, vanilla, ½ cup (150 mg).  · Raspberries, ½ cup (90 mg).  · Rhubarb, ½ cup (115 mg).  · Rice, wild, ½ cup (80 mg).  · Shrimp, 3 oz (155  mg).  · Spinach, raw, 1 cup (170 mg).  · Strawberries, ½ cup (125 mg).  · Summer squash ½ cup (175-200 mg).  · Swiss chard, raw, 1 cup (135 mg).  · Tangerines, 1 each (140 mg).  · Tea, brewed, 6 oz (65 mg).  · Turnips, ½ cup (140 mg).  · Watermelon, ½ cup (85 mg).  · Wine, red, table, 5 oz (180 mg).  · Wine, white, table, 5 oz (100 mg).  LOW IN POTASSIUM  The following foods and beverages have less than 50 mg of potassium per serving.  · Bread, white, 1 slice (30 mg).  · Carbonated beverages, 12 oz (less than 5 mg).  · Cheese, 1 oz (20-30 mg).  · Cranberries, ½ cup (45 mg).  · Cranberry juice cocktail, ½ cup (20 mg).  · Fats and oils, 1 Tbsp (less than 5 mg).  · Hummus, 1 Tbsp (32 mg).  · Nectar: papaya, mango, or pear, ½ cup (35 mg).  · Rice, white or brown, ½ cup (50 mg).  · Spaghetti or macaroni, ½ cup cooked (30 mg).  · Tortilla, flour or corn, 1 each (50 mg).  · Waffle, 4 in., 1 each (50 mg).  · Water chestnuts, ½ cup (40 mg).  Document Released: 08/14/2004 Document Revised: 01/05/2013 Document Reviewed: 11/27/2012  [employer]® Patient Information ©2015 [employer]. This information is not intended to replace advice given to you by your health care provider. Make sure you discuss any questions you have with your health care provider.

## 2014-01-21 DIAGNOSIS — S90122A Contusion of left lesser toe(s) without damage to nail, initial encounter: Secondary | ICD-10-CM | POA: Diagnosis not present

## 2014-02-07 ENCOUNTER — Encounter: Payer: Self-pay | Admitting: Family Medicine

## 2014-02-07 ENCOUNTER — Ambulatory Visit (INDEPENDENT_AMBULATORY_CARE_PROVIDER_SITE_OTHER): Payer: Medicare Other | Admitting: Family Medicine

## 2014-02-07 VITALS — BP 130/82 | HR 85 | Temp 98.0°F | Wt 206.0 lb

## 2014-02-07 DIAGNOSIS — H109 Unspecified conjunctivitis: Secondary | ICD-10-CM

## 2014-02-07 DIAGNOSIS — J018 Other acute sinusitis: Secondary | ICD-10-CM | POA: Diagnosis not present

## 2014-02-07 MED ORDER — AZITHROMYCIN 250 MG PO TABS: ORAL_TABLET | ORAL | Status: AC

## 2014-02-07 MED ORDER — POLYMYXIN B-TRIMETHOPRIM 10000-0.1 UNIT/ML-% OP SOLN: 2.0000 [drp] | OPHTHALMIC | Status: DC

## 2014-02-07 MED ORDER — METOPROLOL SUCCINATE ER 100 MG PO TB24
50.0000 mg | ORAL_TABLET | Freq: Every day | ORAL | Status: DC
Start: 2014-02-07 — End: 2014-12-01

## 2014-02-07 NOTE — Patient Instructions (Signed)

## 2014-02-07 NOTE — Progress Notes (Signed)
Subjective:    Patient ID: Margaret Mitchell, female    DOB: 1940-12-17, 74 y.o.   MRN: [id number]  HPI Acute visit. Patient is seen with onset about a week ago of cough and sinus pressure. She's had some intermittent headaches. Frontal sinus pressure. Cough productive of green sputum. Increased malaise. Denies any fever. 2 days ago started developing some bilateral eye drainage and matting from both eyes along with some mild erythema. No blurred vision. No eye pain. Her symptoms have progressed over the week. No dyspnea. No wheezing.  Past Medical History  Diagnosis Date  . Allergy, unspecified not elsewhere classified   . IBS (irritable bowel syndrome)   . Headache(784.0)   . Pelvic adhesions   . Pelvic pain in female   . Sinus infection   . Anxiety   . Hypertension   . Osteoporosis   . Thyroid disease     Parathyroid cyst  . Cervical dysplasia     prior to hysterectomy   Past Surgical History  Procedure Laterality Date  . Parathyroid adenoma removed    . Vesicovaginal fistula closure w/ tah    . Tonsillectomy    . Appendectomy    . Tubal ligation  1981  . Rotator cuff repair      X2  . Foot surgery      RIGHT AND LEFT  . Colposcopy    . Oophorectomy      BSO  . Cataract extraction  2013  . Shoulder injection  10/2011  . Abdominal hysterectomy  1983    TAH/BSO for pelvic pain and adhesions    reports that she has never smoked. She has never used smokeless tobacco. She reports that she does not drink alcohol or use illicit drugs. family history includes Cancer in her brother and father; Diabetes in her maternal aunt, maternal grandmother, and mother; Heart disease in her maternal grandmother; Heart failure in her paternal grandmother; Hypertension in her maternal grandmother; Lung cancer in her father; Stroke in her maternal grandmother. Allergies  Allergen Reactions  . Prednisone     REACTION: heart palpatations  In larger doses  . Rofecoxib     REACTION: mouth  swelling (VIOXX)  . Sulfonamide Derivatives     REACTION: pt unable to remember  . Terfenadine     SELDANE      Review of Systems  Constitutional: Positive for fatigue. Negative for fever and chills.  HENT: Positive for congestion and sinus pressure.   Eyes: Positive for discharge and redness. Negative for visual disturbance.  Respiratory: Positive for cough.   Neurological: Positive for headaches.       Objective:   Physical Exam  Constitutional: She appears well-developed and well-nourished.  HENT:  Right Ear: External ear normal.  Left Ear: External ear normal.  Mouth/Throat: Oropharynx is clear and moist.  Eyes:  Conjunctivae are erythematous bilaterally. Otherwise eye exam normal  Neck: Neck supple.  Cardiovascular: Normal rate and regular rhythm.   Pulmonary/Chest: Effort normal and breath sounds normal. No respiratory distress. She has no wheezes. She has no rales.  Lymphadenopathy:    She has no cervical adenopathy.          Assessment & Plan:  Patient presents with upper respiratory symptoms and probable acute bronchitis as well as acute frontal sinusitis. She has bilateral probable bacterial conjunctivitis. Polytrim ophthalmic drops. Start Zithromax. Stay well-hydrated. Follow-up as needed

## 2014-02-07 NOTE — Progress Notes (Signed)
Pre visit review using our clinic review tool, if applicable. No additional management support is needed unless otherwise documented below in the visit note. 

## 2014-02-24 DIAGNOSIS — M79644 Pain in right finger(s): Secondary | ICD-10-CM | POA: Diagnosis not present

## 2014-02-24 DIAGNOSIS — M79645 Pain in left finger(s): Secondary | ICD-10-CM | POA: Diagnosis not present

## 2014-03-16 ENCOUNTER — Encounter: Payer: Self-pay | Admitting: Family Medicine

## 2014-03-20 ENCOUNTER — Other Ambulatory Visit: Payer: Self-pay | Admitting: Pulmonary Disease

## 2014-05-25 ENCOUNTER — Encounter: Payer: Self-pay | Admitting: Family Medicine

## 2014-05-25 ENCOUNTER — Ambulatory Visit (INDEPENDENT_AMBULATORY_CARE_PROVIDER_SITE_OTHER): Payer: Medicare Other | Admitting: Family Medicine

## 2014-05-25 VITALS — BP 130/80 | HR 76 | Temp 97.7°F | Wt 207.0 lb

## 2014-05-25 DIAGNOSIS — I10 Essential (primary) hypertension: Secondary | ICD-10-CM

## 2014-05-25 NOTE — Progress Notes (Signed)
Pre visit review using our clinic review tool, if applicable. No additional management support is needed unless otherwise documented below in the visit note. 

## 2014-05-25 NOTE — Progress Notes (Signed)
Subjective:    Patient ID: Margaret Mitchell, female    DOB: 12-31-1940, 74 y.o.   MRN: [id number]  HPI Patient here for follow-up regarding hypertension. She takes Lotrel and metoprolol. She is dealing with tremendous stress with her mother who has advanced Parkinson's disease. 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 No exertional chest pains whatsoever. No dyspnea. Compliant with medications. No dizziness. No headaches.  Past Medical History  Diagnosis Date  . Allergy, unspecified not elsewhere classified   . IBS (irritable bowel syndrome)   . Headache(784.0)   . Pelvic adhesions   . Pelvic pain in female   . Sinus infection   . Anxiety   . Hypertension   . Osteoporosis   . Thyroid disease     Parathyroid cyst  . Cervical dysplasia     prior to hysterectomy   Past Surgical History  Procedure Laterality Date  . Parathyroid adenoma removed    . Vesicovaginal fistula closure w/ tah    . Tonsillectomy    . Appendectomy    . Tubal ligation  1981  . Rotator cuff repair      X2  . Foot surgery      RIGHT AND LEFT  . Colposcopy    . Oophorectomy      BSO  . Cataract extraction  2013  . Shoulder injection  10/2011  . Abdominal hysterectomy  1983    TAH/BSO for pelvic pain and adhesions    reports that she has never smoked. She has never used smokeless tobacco. She reports that she does not drink alcohol or use illicit drugs. family history includes Cancer in her brother and father; Diabetes in her maternal aunt, maternal grandmother, and mother; Heart disease in her maternal grandmother; Heart failure in her paternal grandmother; Hypertension in her maternal grandmother; Lung cancer in her father; Stroke in her maternal grandmother. Allergies  Allergen Reactions  . Prednisone     REACTION: heart palpatations  In larger doses  . Rofecoxib     REACTION: mouth swelling (VIOXX)  . Sulfonamide Derivatives     REACTION: pt unable  to remember  . Terfenadine     SELDANE      Review of Systems  Constitutional: Negative for fatigue.  Eyes: Negative for visual disturbance.  Respiratory: Negative for cough, chest tightness, shortness of breath and wheezing.   Cardiovascular: Negative for chest pain, palpitations and leg swelling.  Neurological: Negative for dizziness, seizures, syncope, weakness, light-headedness and headaches.       Objective:   Physical Exam  Constitutional: She appears well-developed and well-nourished.  Neck: Neck supple. No thyromegaly present.  Cardiovascular: Normal rate and regular rhythm.   Pulmonary/Chest: Effort normal and breath sounds normal. No respiratory distress. She has no wheezes. She has no rales.  Musculoskeletal: She exhibits no edema.          Assessment & Plan:  Hypertension. Well controlled. Repeat blood pressure by me left arm seated 116/70. Continue current medications. Recheck 6 months and obtain screening lab work then

## 2014-05-27 DIAGNOSIS — M79644 Pain in right finger(s): Secondary | ICD-10-CM | POA: Diagnosis not present

## 2014-05-27 DIAGNOSIS — M79645 Pain in left finger(s): Secondary | ICD-10-CM | POA: Diagnosis not present

## 2014-06-02 DIAGNOSIS — M79641 Pain in right hand: Secondary | ICD-10-CM | POA: Diagnosis not present

## 2014-06-02 DIAGNOSIS — M79642 Pain in left hand: Secondary | ICD-10-CM | POA: Diagnosis not present

## 2014-06-02 DIAGNOSIS — M6281 Muscle weakness (generalized): Secondary | ICD-10-CM | POA: Diagnosis not present

## 2014-06-02 DIAGNOSIS — M150 Primary generalized (osteo)arthritis: Secondary | ICD-10-CM | POA: Diagnosis not present

## 2014-06-07 DIAGNOSIS — M6281 Muscle weakness (generalized): Secondary | ICD-10-CM | POA: Diagnosis not present

## 2014-06-07 DIAGNOSIS — M79642 Pain in left hand: Secondary | ICD-10-CM | POA: Diagnosis not present

## 2014-06-07 DIAGNOSIS — M150 Primary generalized (osteo)arthritis: Secondary | ICD-10-CM | POA: Diagnosis not present

## 2014-06-07 DIAGNOSIS — M79641 Pain in right hand: Secondary | ICD-10-CM | POA: Diagnosis not present

## 2014-06-09 DIAGNOSIS — M79642 Pain in left hand: Secondary | ICD-10-CM | POA: Diagnosis not present

## 2014-06-09 DIAGNOSIS — M79641 Pain in right hand: Secondary | ICD-10-CM | POA: Diagnosis not present

## 2014-06-09 DIAGNOSIS — M6281 Muscle weakness (generalized): Secondary | ICD-10-CM | POA: Diagnosis not present

## 2014-06-09 DIAGNOSIS — M150 Primary generalized (osteo)arthritis: Secondary | ICD-10-CM | POA: Diagnosis not present

## 2014-06-14 DIAGNOSIS — M150 Primary generalized (osteo)arthritis: Secondary | ICD-10-CM | POA: Diagnosis not present

## 2014-06-14 DIAGNOSIS — M6281 Muscle weakness (generalized): Secondary | ICD-10-CM | POA: Diagnosis not present

## 2014-06-14 DIAGNOSIS — M79642 Pain in left hand: Secondary | ICD-10-CM | POA: Diagnosis not present

## 2014-06-14 DIAGNOSIS — M79641 Pain in right hand: Secondary | ICD-10-CM | POA: Diagnosis not present

## 2014-06-16 DIAGNOSIS — M79641 Pain in right hand: Secondary | ICD-10-CM | POA: Diagnosis not present

## 2014-06-16 DIAGNOSIS — M6281 Muscle weakness (generalized): Secondary | ICD-10-CM | POA: Diagnosis not present

## 2014-06-16 DIAGNOSIS — M150 Primary generalized (osteo)arthritis: Secondary | ICD-10-CM | POA: Diagnosis not present

## 2014-06-16 DIAGNOSIS — M79642 Pain in left hand: Secondary | ICD-10-CM | POA: Diagnosis not present

## 2014-06-21 DIAGNOSIS — M79641 Pain in right hand: Secondary | ICD-10-CM | POA: Diagnosis not present

## 2014-06-21 DIAGNOSIS — M79642 Pain in left hand: Secondary | ICD-10-CM | POA: Diagnosis not present

## 2014-06-21 DIAGNOSIS — M150 Primary generalized (osteo)arthritis: Secondary | ICD-10-CM | POA: Diagnosis not present

## 2014-06-21 DIAGNOSIS — M6281 Muscle weakness (generalized): Secondary | ICD-10-CM | POA: Diagnosis not present

## 2014-06-23 DIAGNOSIS — M79642 Pain in left hand: Secondary | ICD-10-CM | POA: Diagnosis not present

## 2014-06-23 DIAGNOSIS — M6281 Muscle weakness (generalized): Secondary | ICD-10-CM | POA: Diagnosis not present

## 2014-06-23 DIAGNOSIS — M150 Primary generalized (osteo)arthritis: Secondary | ICD-10-CM | POA: Diagnosis not present

## 2014-06-23 DIAGNOSIS — M79641 Pain in right hand: Secondary | ICD-10-CM | POA: Diagnosis not present

## 2014-07-12 DIAGNOSIS — M150 Primary generalized (osteo)arthritis: Secondary | ICD-10-CM | POA: Diagnosis not present

## 2014-07-12 DIAGNOSIS — M79641 Pain in right hand: Secondary | ICD-10-CM | POA: Diagnosis not present

## 2014-07-12 DIAGNOSIS — M79642 Pain in left hand: Secondary | ICD-10-CM | POA: Diagnosis not present

## 2014-07-12 DIAGNOSIS — M6281 Muscle weakness (generalized): Secondary | ICD-10-CM | POA: Diagnosis not present

## 2014-07-13 DIAGNOSIS — M79645 Pain in left finger(s): Secondary | ICD-10-CM | POA: Diagnosis not present

## 2014-07-13 DIAGNOSIS — M79644 Pain in right finger(s): Secondary | ICD-10-CM | POA: Diagnosis not present

## 2014-08-09 ENCOUNTER — Other Ambulatory Visit: Payer: Self-pay | Admitting: Family Medicine

## 2014-08-29 DIAGNOSIS — M65311 Trigger thumb, right thumb: Secondary | ICD-10-CM | POA: Diagnosis not present

## 2014-08-29 DIAGNOSIS — M65312 Trigger thumb, left thumb: Secondary | ICD-10-CM | POA: Diagnosis not present

## 2014-09-08 DIAGNOSIS — H26493 Other secondary cataract, bilateral: Secondary | ICD-10-CM | POA: Diagnosis not present

## 2014-09-08 DIAGNOSIS — Z961 Presence of intraocular lens: Secondary | ICD-10-CM | POA: Diagnosis not present

## 2014-09-23 ENCOUNTER — Ambulatory Visit (INDEPENDENT_AMBULATORY_CARE_PROVIDER_SITE_OTHER): Payer: Medicare Other | Admitting: Internal Medicine

## 2014-09-23 ENCOUNTER — Encounter: Payer: Self-pay | Admitting: Internal Medicine

## 2014-09-23 VITALS — BP 150/80 | HR 72 | Temp 97.8°F | Ht 66.5 in | Wt 208.4 lb

## 2014-09-23 DIAGNOSIS — J988 Other specified respiratory disorders: Secondary | ICD-10-CM

## 2014-09-23 DIAGNOSIS — J22 Unspecified acute lower respiratory infection: Secondary | ICD-10-CM

## 2014-09-23 MED ORDER — HYDROCODONE-HOMATROPINE 5-1.5 MG/5ML PO SYRP: 5.0000 mL | ORAL_SOLUTION | ORAL | Status: DC | PRN

## 2014-09-23 NOTE — Patient Instructions (Addendum)
This acts like a viral respiratory infection that can last 10 - 14 days but should feel better in 3-5 days  . Cough can last weeks. But should feel better next week.  Cough med for comfort .  Fluids  Rest until improved.   Contact health team if shortness of breath fever shaking chills  .  Relapsing  Sx  After improving . Severe pain . Your lung exam is normal today Oxygen level is good

## 2014-09-23 NOTE — Progress Notes (Signed)
Pre visit review using our clinic review tool, if applicable. No additional management support is needed unless otherwise documented below in the visit note.  Chief Complaint  Patient presents with  . Cough  . Headache    Started Tuesday with a headache.  Getting worse  . Sore Throat  . Nasal Congestion    HPI: Patient Margaret Mitchell  comes in today for SDA for  new problem evaluation. PCP NA  3 days  onset Sinus congestion and then  Headache    No fever .   Evolving.  Now has some cough dry mostly has right anterior rib pain she feels from coughing no shortness of breath.  tried    mucinex  And   Decongestant and robitussin   going to the beach in 3 days on vacation. Denies any chronic lung disease fever chills hemoptysis. No significant GI symptoms.  ROS: See pertinent positives and negatives per HPI.  Past Medical History  Diagnosis Date  . Allergy, unspecified not elsewhere classified   . IBS (irritable bowel syndrome)   . Headache(784.0)   . Pelvic adhesions   . Pelvic pain in female   . Sinus infection   . Anxiety   . Hypertension   . Osteoporosis   . Thyroid disease     Parathyroid cyst  . Cervical dysplasia     prior to hysterectomy    Family History  Problem Relation Age of Onset  . Diabetes Mother   . Cancer Father     LUNG  . Lung cancer Father   . Cancer Brother     LEUKEMIA  . Hypertension Maternal Grandmother   . Heart disease Maternal Grandmother   . Diabetes Maternal Grandmother   . Stroke Maternal Grandmother   . Diabetes Maternal Aunt   . Heart failure Paternal Grandmother     Social History   Social History  . Marital Status: Married    Spouse Name: Laddie Aquas  . Number of Children: 1  . Years of Education: N/A   Occupational History  .     Social History Main Topics  . Smoking status: Never Smoker   . Smokeless tobacco: Never Used  . Alcohol Use: No  . Drug Use: No  . Sexual Activity: No   Other Topics Concern  . None    Social History Narrative   Lives with husband.   Retired from working Surveyor, mining at [employer].   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    Outpatient Prescriptions Prior to Visit  Medication Sig Dispense Refill  . amLODipine-benazepril (LOTREL) 5-20 MG per capsule TAKE 1 CAPSULE DAILY 90 capsule 2  . aspirin 81 MG tablet Take 81 mg by mouth daily.      . cholecalciferol (VITAMIN D) 1000 UNITS tablet Take 1,000 Units by mouth daily.    Marland Kitchen esomeprazole (NEXIUM) 40 MG capsule TAKE 1 CAPSULE BY MOUTH TWICE  A DAY 30 MINUTES BEFORE MEALS (Patient taking differently: Take 40 mg by mouth. TAKE 1 CAPSULE BY MOUTH once daily) 180 capsule 3  . metoprolol succinate (TOPROL-XL) 100 MG 24 hr tablet Take 1 tablet (100 mg total) by mouth daily. Take with or immediately following a meal. 90 tablet 3  . senna-docusate (SENOKOT-S) 8.6-50 MG per tablet Take 1 tablet by mouth daily.    . famciclovir (FAMVIR) 500 MG tablet Take 1 tablet (500 mg total) by mouth 3 (three) times daily. (Patient not taking: Reported on 09/23/2014) 21 tablet 0  No facility-administered medications prior to visit.     EXAM:  BP 150/80 mmHg  Pulse 72  Temp(Src) 97.8 F (36.6 C) (Oral)  Ht 5' 6.5" (1.689 m)  Wt 208 lb 6.4 oz (94.53 kg)  BMI 33.14 kg/m2  SpO2 98%  Body mass index is 33.14 kg/(m^2).  GENERAL: vitals reviewed and listed above, alert, oriented, appears well hydrated and in no acute distress she appears mildly congested nontoxic normal quiet respirations. Occasional dry cough deep. HEENT: atraumatic, conjunctiva  clear, no obvious abnormalities on inspection of external nose and ears congested face nontender TM normal landmarks OP : no lesion edema or exudate  NECK: no obvious masses on inspection palpation no adenopathy supple LUNGS: clear to auscultation bilaterally, no wheezes, rales or rhonchi, CV: HRRR, no clubbing cyanosis or  peripheral edema nl cap refill  Abdomen soft without organomegaly  guarding or rebound rib cage slightly tender in the right anterior lower T area. Skin turgor normal normal capillary refill no rashes. MS: moves all extremities without noticeable focal  abnormality PSYCH: pleasant and cooperative, no obvious depression or anxiety  ASSESSMENT AND PLAN:  Discussed the following assessment and plan:  Acute respiratory infection - prob viral  expectant managemnt  to seek care for alarm sx. cough med for night comfort benefit more than risk.    Expectant management.  -Patient advised to return or notify health care team  if symptoms worsen ,persist or new concerns arise.  Patient Instructions  This acts like a viral respiratory infection that can last 10 - 14 days but should feel better in 3-5 days  . Cough can last weeks. But should feel better next week.  Cough med for comfort .  Fluids  Rest until improved.   Contact health team if shortness of breath fever shaking chills  .  Relapsing  Sx  After improving . Severe pain . Your lung exam is normal today Oxygen level is good    Mariann Laster K. Camrie Stock M.D.

## 2014-10-03 ENCOUNTER — Encounter: Payer: Medicare Other | Admitting: Gynecology

## 2014-10-26 ENCOUNTER — Encounter: Payer: Self-pay | Admitting: Gastroenterology

## 2014-10-28 ENCOUNTER — Ambulatory Visit (INDEPENDENT_AMBULATORY_CARE_PROVIDER_SITE_OTHER): Payer: Medicare Other

## 2014-10-28 DIAGNOSIS — Z23 Encounter for immunization: Secondary | ICD-10-CM

## 2014-11-21 ENCOUNTER — Encounter: Payer: Self-pay | Admitting: Gynecology

## 2014-11-21 ENCOUNTER — Ambulatory Visit (INDEPENDENT_AMBULATORY_CARE_PROVIDER_SITE_OTHER): Payer: Medicare Other | Admitting: Gynecology

## 2014-11-21 VITALS — BP 124/80 | Ht 67.0 in | Wt 210.0 lb

## 2014-11-21 DIAGNOSIS — Z01419 Encounter for gynecological examination (general) (routine) without abnormal findings: Secondary | ICD-10-CM

## 2014-11-21 DIAGNOSIS — M8580 Other specified disorders of bone density and structure, unspecified site: Secondary | ICD-10-CM | POA: Diagnosis not present

## 2014-11-21 DIAGNOSIS — N952 Postmenopausal atrophic vaginitis: Secondary | ICD-10-CM | POA: Diagnosis not present

## 2014-11-21 DIAGNOSIS — N644 Mastodynia: Secondary | ICD-10-CM | POA: Diagnosis not present

## 2014-11-21 NOTE — Progress Notes (Signed)
Margaret Mitchell [birth year] [id number]        74 y.o.  G1P0010  No LMP recorded. Patient has had a hysterectomy. for breast and pelvic exam.  Past medical history,surgical history, problem list, medications, allergies, family history and social history were all reviewed and documented as reviewed in the EPIC chart.  ROS:  Performed with pertinent positives and negatives included in the history, assessment and plan.   Additional significant findings :  none   Exam: [name] Counsellor Vitals:   11/21/14 0952  BP: 124/80  Height: 5\' 7"  (1.702 m)  Weight: 210 lb (95.255 kg)   General appearance:  Normal affect, orientation and appearance. Skin: Grossly normal HEENT: Without gross lesions.  No cervical or supraclavicular adenopathy. Thyroid normal.  Lungs:  Clear without wheezing, rales or rhonchi Cardiac: RR, without RMG Abdominal:  Soft, nontender, without masses, guarding, rebound, organomegaly or hernia Breasts:  Examined lying and sitting without masses, retractions, discharge or axillary adenopathy. Pelvic:  Ext/BUS/vagina with atrophic changes  Adnexa  Without masses or tenderness    Anus and perineum  Normal   Rectovaginal  Normal sphincter tone without palpated masses or tenderness.    Assessment/Plan:  74 y.o. G34P0010 female for breast and pelvic exam.   1. Postmenopausal/atrophic genital changes. Status post TAH/BSO 1983 for pelvic pain and adhesions. The previously been on Premarin but stopped this and is doing well without significant hot flushes, night sweats or vaginal dryness. We'll continue to monitor and report any issues. 2. Osteopenia. DEXA 2014 T score -1.1. Was diagnosed with osteoporosis cystorrhaphy and had been on Actonel and Fosamax from 2004 through 2011. Will plan repeat DEXA in another year or 2. Increased calcium vitamin D reviewed. 3. Pap smear 2011. No Pap smear done today.  Does have a history of dysplasia a number of years ago preceding her  hysterectomy. Has had normal Pap smears afterwards. We've previously discussed current screening guidelines and both agree to stop screening. 4. Mammography scheduled next week. SBE monthly reviewed.  Does note some intermittent bilateral mastalgia that comes and goes. No persistent areas of discomfort or any palpable abnormalities on self breast exam. Exam today is normal. Assuming mammogram normal them plan expectant management. Follow up if any persistent discomfort or palpable abnormalities. 5. Colonoscopy 2007. Coming due next year and patient will schedule. 6. Health maintenance. No routine lab work done as patient reports this done at her primary physician's office. Follow up 1 year, sooner as needed. 7.    Anastasio Auerbach MD, 10:26 AM 11/21/2014

## 2014-11-21 NOTE — Patient Instructions (Signed)

## 2014-11-28 DIAGNOSIS — Z1231 Encounter for screening mammogram for malignant neoplasm of breast: Secondary | ICD-10-CM | POA: Diagnosis not present

## 2014-11-29 ENCOUNTER — Encounter: Payer: Self-pay | Admitting: Gynecology

## 2014-12-01 ENCOUNTER — Encounter: Payer: Self-pay | Admitting: Family Medicine

## 2014-12-01 ENCOUNTER — Ambulatory Visit (INDEPENDENT_AMBULATORY_CARE_PROVIDER_SITE_OTHER): Payer: Medicare Other | Admitting: Family Medicine

## 2014-12-01 VITALS — BP 140/84 | HR 69 | Temp 98.2°F | Resp 16 | Ht 67.0 in | Wt 209.1 lb

## 2014-12-01 DIAGNOSIS — I10 Essential (primary) hypertension: Secondary | ICD-10-CM

## 2014-12-01 DIAGNOSIS — K219 Gastro-esophageal reflux disease without esophagitis: Secondary | ICD-10-CM | POA: Diagnosis not present

## 2014-12-01 DIAGNOSIS — K5909 Other constipation: Secondary | ICD-10-CM | POA: Insufficient documentation

## 2014-12-01 DIAGNOSIS — K5900 Constipation, unspecified: Secondary | ICD-10-CM | POA: Diagnosis not present

## 2014-12-01 LAB — BASIC METABOLIC PANEL
BUN: 16 mg/dL (ref 6–23)
CALCIUM: 9.5 mg/dL (ref 8.4–10.5)
CO2: 27 mEq/L (ref 19–32)
Chloride: 106 mEq/L (ref 96–112)
Creatinine, Ser: 0.93 mg/dL (ref 0.40–1.20)
GFR: 76.1 mL/min (ref >60.00)
Glucose, Bld: 94 mg/dL (ref 70–99)
POTASSIUM: 4.7 meq/L (ref 3.5–5.1)
Sodium: 141 mEq/L (ref 135–145)

## 2014-12-01 LAB — TSH: TSH: 2.25 u[IU]/mL (ref 0.35–4.50)

## 2014-12-01 NOTE — Progress Notes (Signed)
Subjective:    Patient ID: Margaret Mitchell, female    DOB: Apr 10, 1940, 74 y.o.   MRN: [id number]  HPI Patient seen for follow-up hypertension  She takes combination therapy with Lotrel and metoprolol Denies any medication side effects. Blood pressures been fairly well controlled. She's had some gradual weight gain in recent years. No dizziness. No headaches. No chest pains.  GERD which has been controlled with Nexium. No recent heartburn symptoms. No dysphagia. She's had some chronic constipation for many years. She's being scheduled for repeat colonoscopy. Prior TSH has been normal. She drinks lots of liquids. She tries to get lots of fiber naturally. She has tried MiraLAX in the past without much success. Recently use some Senokot.  Past Medical History  Diagnosis Date  . Allergy, unspecified not elsewhere classified   . IBS (irritable bowel syndrome)   . Headache(784.0)   . Pelvic adhesions   . Pelvic pain in female   . Sinus infection   . Anxiety   . Hypertension   . Osteoporosis   . Thyroid disease     Parathyroid cyst  . Cervical dysplasia     prior to hysterectomy   Past Surgical History  Procedure Laterality Date  . Parathyroid adenoma removed    . Vesicovaginal fistula closure w/ tah    . Tonsillectomy    . Appendectomy    . Tubal ligation  1981  . Rotator cuff repair      X2  . Foot surgery      RIGHT AND LEFT  . Colposcopy    . Oophorectomy      BSO  . Cataract extraction  2013  . Shoulder injection  10/2011  . Abdominal hysterectomy  1983    TAH/BSO for pelvic pain and adhesions    reports that she has never smoked. She has never used smokeless tobacco. She reports that she drinks about 0.6 oz of alcohol per week. She reports that she does not use illicit drugs. family history includes Cancer in her brother and father; Diabetes in her maternal aunt, maternal grandmother, and mother; Heart disease in her maternal grandmother; Heart failure in her paternal  grandmother; Hypertension in her maternal grandmother; Lung cancer in her father; Stroke in her maternal grandmother. Allergies  Allergen Reactions  . Prednisone     REACTION: heart palpatations  In larger doses  . Rofecoxib     REACTION: mouth swelling (VIOXX)  . Sulfonamide Derivatives     REACTION: pt unable to remember  . Terfenadine     SELDANE      Review of Systems  Constitutional: Negative for fatigue and unexpected weight change.  Eyes: Negative for visual disturbance.  Respiratory: Negative for cough, chest tightness, shortness of breath and wheezing.   Cardiovascular: Negative for chest pain, palpitations and leg swelling.  Gastrointestinal: Positive for constipation. Negative for nausea and vomiting.  Endocrine: Negative for polydipsia and polyuria.  Neurological: Negative for dizziness, seizures, syncope, weakness, light-headedness and headaches.       Objective:   Physical Exam  Constitutional: She appears well-developed and well-nourished. No distress.  Neck: Neck supple.  Cardiovascular: Normal rate and regular rhythm.   Pulmonary/Chest: Effort normal and breath sounds normal. No respiratory distress. She has no wheezes. She has no rales.  Musculoskeletal: She exhibits no edema.  Neurological: She is alert.          Assessment & Plan:  #1 hypertension. Marginal control. She is strongly encouraged to lose weight. Check basic  metabolic panel #2 chronic constipation. We discussed lifestyle measures. She will try MiraLAX again  #3 history of GERD well controlled on Nexium.

## 2014-12-23 ENCOUNTER — Encounter: Payer: Self-pay | Admitting: Gastroenterology

## 2014-12-23 ENCOUNTER — Ambulatory Visit (INDEPENDENT_AMBULATORY_CARE_PROVIDER_SITE_OTHER): Payer: Medicare Other | Admitting: Gastroenterology

## 2014-12-23 VITALS — BP 130/70 | HR 72 | Ht 66.25 in | Wt 210.0 lb

## 2014-12-23 DIAGNOSIS — R194 Change in bowel habit: Secondary | ICD-10-CM

## 2014-12-23 DIAGNOSIS — K5900 Constipation, unspecified: Secondary | ICD-10-CM | POA: Diagnosis not present

## 2014-12-23 MED ORDER — NA SULFATE-K SULFATE-MG SULF 17.5-3.13-1.6 GM/177ML PO SOLN: 1.0000 | Freq: Once | ORAL | Status: DC

## 2014-12-23 MED ORDER — LINACLOTIDE 145 MCG PO CAPS: 145.0000 ug | ORAL_CAPSULE | Freq: Every day | ORAL | Status: DC

## 2014-12-23 NOTE — Patient Instructions (Addendum)
You have been scheduled for a colonoscopy. Please follow written instructions given to you at your visit today.  Please pick up your prep supplies at the pharmacy within the next 1-3 days. If you use inhalers (even only as needed), please bring them with you on the day of your procedure. Your physician has requested that you go to www.startemmi.com and enter the access code given to you at your visit today. This web site gives a general overview about your procedure. However, you should still follow specific instructions given to you by our office regarding your preparation for the procedure.  Follow up in 6 months We are sending in [place] prescription to your pharmacy today

## 2014-12-23 NOTE — Progress Notes (Signed)
Margaret Mitchell    [id number]    08-Apr-1940  Primary Care Physician:BURCHETTE,BRUCE W, MD  Referring Physician: Kristian Covey, MD [address] [place], [place] [address]  Chief complaint:  Chronic constipation, change in bowel habits  HPI: 74 year old female with history of chronic constipation comes in with complaints of change in bowel pattern. Her constipation is worse and she would have a bowel movement maybe once a week if she is doesn't take any Dulcolax or senna. If she takes the laxatives she goes one or 2 times a week. Denies any bright red blood per rectum or blood in stool. Her last colonoscopy was in September 2007 was normal with no polyps. Denies any nausea, vomiting, abdominal pain, or melena Her weight has been stable and no recent dietary changes.     Outpatient Encounter Prescriptions as of 12/23/2014  Medication Sig  . amLODipine-benazepril (LOTREL) 5-20 MG per capsule TAKE 1 CAPSULE DAILY  . aspirin 81 MG tablet Take 81 mg by mouth daily.    . cholecalciferol (VITAMIN D) 1000 UNITS tablet Take 1,000 Units by mouth daily.  Marland Kitchen CINNAMON PO Take 2,000 mg by mouth daily.  Marland Kitchen esomeprazole (NEXIUM) 40 MG capsule TAKE 1 CAPSULE BY MOUTH TWICE  A DAY 30 MINUTES BEFORE MEALS (Patient taking differently: Take 40 mg by mouth. TAKE 1 CAPSULE BY MOUTH once daily)  . metoprolol succinate (TOPROL-XL) 100 MG 24 hr tablet Take 50 mg by mouth daily. Take one half tablet once daily  . Nutritional Supplements (ESTROVEN PO) Take 1 capsule by mouth daily.  . Omega-3 Fatty Acids (FISH OIL) 1200 MG CPDR Take 2 tablets by mouth daily.  . Linaclotide (LINZESS) 145 MCG CAPS capsule Take 1 capsule (145 mcg total) by mouth daily.   No facility-administered encounter medications on file as of 12/23/2014.    Allergies as of 12/23/2014 - Review Complete 12/23/2014  Allergen Reaction Noted  . Prednisone    . Seldane [terfenadine]  06/03/2007  . Sulfonamide derivatives    .  Vioxx [rofecoxib]      Past Medical History  Diagnosis Date  . Allergy, unspecified not elsewhere classified   . IBS (irritable bowel syndrome)   . Headache(784.0)   . Pelvic adhesions   . Pelvic pain in female   . Sinus infection   . Anxiety   . Hypertension   . Osteoporosis   . Thyroid disease     Parathyroid cyst  . Cervical dysplasia     prior to hysterectomy  . Arthritis   . GERD (gastroesophageal reflux disease)   . Alzheimer disease     Past Surgical History  Procedure Laterality Date  . Parathyroid adenoma removed  02/1980  . Vesicovaginal fistula closure w/ tah    . Tonsillectomy    . Appendectomy  1994  . Tubal ligation  1981  . Rotator cuff repair      X2  . Foot surgery Bilateral   . Colposcopy    . Cataract extraction Bilateral 2013  . Shoulder injection  10/2011  . Abdominal hysterectomy  1983    TAH/BSO for pelvic pain and adhesions    Family History  Problem Relation Age of Onset  . Diabetes Mother   . Lung cancer Father   . Leukemia Brother   . Hypertension Maternal Grandmother   . Heart disease Maternal Grandmother   . Diabetes Maternal Grandmother   . Stroke Maternal Grandmother   .  Diabetes Maternal Aunt   . Heart failure Paternal Grandmother     Social History   Social History  . Marital Status: Married    Spouse Name: Charlann Noss  . Number of Children: 1  . Years of Education: N/A   Occupational History  . retired    Social History Main Topics  . Smoking status: Never Smoker   . Smokeless tobacco: Never Used  . Alcohol Use: 0.6 oz/week    1 Standard drinks or equivalent per week     Comment: Rare  . Drug Use: No  . Sexual Activity: No     Comment: 1st intercourse 74 yo-Fewer than 5 partners   Other Topics Concern  . Not on file   Social History Narrative   Lives with husband.   Retired from working Barrister's clerk at [employer]   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      Review of systems: Review of  Systems  Constitutional: Negative for fever and chills.  HENT: Negative.   Eyes: Negative for blurred vision.  Respiratory: Negative for cough, shortness of breath and wheezing.   Cardiovascular: Negative for chest pain and palpitations.  Gastrointestinal: as per HPI Genitourinary: Negative for dysuria, urgency, frequency and hematuria.  Musculoskeletal: Negative for myalgias, back pain and joint pain.  Skin: Negative for itching and rash.  Neurological: Negative for dizziness, tremors, focal weakness, seizures and loss of consciousness.  Endo/Heme/Allergies: Negative for environmental allergies.  Psychiatric/Behavioral: Negative for depression, suicidal ideas and hallucinations.  All other systems reviewed and are negative.   Physical Exam: Filed Vitals:   12/23/14 1410  BP: 130/70  Pulse: 72   Gen:      No acute distress HEENT:  EOMI, sclera anicteric Neck:     No masses; no thyromegaly Lungs:    Clear to auscultation bilaterally; normal respiratory effort CV:         Regular rate and rhythm; no murmurs Abd:      + bowel sounds; soft, non-tender; no palpable masses, no distension Ext:    No edema; adequate peripheral perfusion Skin:      Warm and dry; no rash Neuro: alert and oriented x 3 Psych: normal mood and affect  Data Reviewed:  Colonoscopy 2007  Assessment and Plan/Recommendations:  74 year old female with history of chronic constipation comes in with complaints of change in bowel pattern We'll start Linzess 145 g daily Advised patient to increase fluid intake Schedule for colonoscopy as last colonoscopy was greater than 9 years ago Return after the procedure  Iona Beard , MD [phone] Mon-Fri 8a-5p [phone] after 5p, weekends, holidays

## 2015-01-05 DIAGNOSIS — S61219A Laceration without foreign body of unspecified finger without damage to nail, initial encounter: Secondary | ICD-10-CM | POA: Diagnosis not present

## 2015-01-13 ENCOUNTER — Other Ambulatory Visit: Payer: Self-pay | Admitting: Family Medicine

## 2015-02-02 ENCOUNTER — Encounter: Payer: Self-pay | Admitting: Gastroenterology

## 2015-02-02 ENCOUNTER — Ambulatory Visit (AMBULATORY_SURGERY_CENTER): Payer: Medicare Other | Admitting: Gastroenterology

## 2015-02-02 VITALS — BP 130/102 | HR 57 | Temp 96.9°F | Resp 15 | Ht 66.24 in | Wt 210.0 lb

## 2015-02-02 DIAGNOSIS — R109 Unspecified abdominal pain: Secondary | ICD-10-CM | POA: Diagnosis not present

## 2015-02-02 DIAGNOSIS — K219 Gastro-esophageal reflux disease without esophagitis: Secondary | ICD-10-CM | POA: Diagnosis not present

## 2015-02-02 DIAGNOSIS — G309 Alzheimer's disease, unspecified: Secondary | ICD-10-CM | POA: Diagnosis not present

## 2015-02-02 DIAGNOSIS — Z1211 Encounter for screening for malignant neoplasm of colon: Secondary | ICD-10-CM | POA: Diagnosis not present

## 2015-02-02 DIAGNOSIS — I10 Essential (primary) hypertension: Secondary | ICD-10-CM | POA: Diagnosis not present

## 2015-02-02 DIAGNOSIS — R194 Change in bowel habit: Secondary | ICD-10-CM | POA: Diagnosis not present

## 2015-02-02 MED ORDER — SODIUM CHLORIDE 0.9 % IV SOLN: 500.0000 mL | INTRAVENOUS | Status: DC

## 2015-02-02 NOTE — Progress Notes (Signed)
Report to PACU, RN, vss, BBS= Clear.  

## 2015-02-02 NOTE — Patient Instructions (Signed)
YOU HAD AN ENDOSCOPIC PROCEDURE TODAY AT [facility]:   Refer to the procedure report that was given to you for any specific questions about what was found during the examination.  If the procedure report does not answer your questions, please call your gastroenterologist to clarify.  If you requested that your care partner not be given the details of your procedure findings, then the procedure report has been included in a sealed envelope for you to review at your convenience later.  YOU SHOULD EXPECT: Some feelings of bloating in the abdomen. Passage of more gas than usual.  Walking can help get rid of the air that was put into your GI tract during the procedure and reduce the bloating. If you had a lower endoscopy (such as a colonoscopy or flexible sigmoidoscopy) you may notice spotting of blood in your stool or on the toilet paper. If you underwent a bowel prep for your procedure, you may not have a normal bowel movement for a few days.  Please Note:  You might notice some irritation and congestion in your nose or some drainage.  This is from the oxygen used during your procedure.  There is no need for concern and it should clear up in a day or so.  SYMPTOMS TO REPORT IMMEDIATELY:   Following lower endoscopy (colonoscopy or flexible sigmoidoscopy):  Excessive amounts of blood in the stool  Significant tenderness or worsening of abdominal pains  Swelling of the abdomen that is new, acute  Fever of 100F or higher   For urgent or emergent issues, a gastroenterologist can be reached at any hour by calling [phone].   DIET: Your first meal following the procedure should be a small meal and then it is ok to progress to your normal diet. Heavy or fried foods are harder to digest and may make you feel nauseous or bloated.  Likewise, meals heavy in dairy and vegetables can increase bloating.  Drink plenty of fluids but you should avoid alcoholic beverages for 24  hours.  ACTIVITY:  You should plan to take it easy for the rest of today and you should NOT DRIVE or use heavy machinery until tomorrow (because of the sedation medicines used during the test).    FOLLOW UP: Our staff will call the number listed on your records the next business day following your procedure to check on you and address any questions or concerns that you may have regarding the information given to you following your procedure. If we do not reach you, we will leave a message.  However, if you are feeling well and you are not experiencing any problems, there is no need to return our call.  We will assume that you have returned to your regular daily activities without incident.  If any biopsies were taken you will be contacted by phone or by letter within the next 1-3 weeks.  Please call us at [phone] if you have not heard about the biopsies in 3 weeks.    SIGNATURES/CONFIDENTIALITY: You and/or your care partner have signed paperwork which will be entered into your electronic medical record.  These signatures attest to the fact that that the information above on your After Visit Summary has been reviewed and is understood.  Full responsibility of the confidentiality of this discharge information lies with you and/or your care-partner.  Repeat Colonoscopy in 10 years Hemorrhoid handout given

## 2015-02-02 NOTE — Op Note (Signed)
[facility]  [address] [place], [address]   COLONOSCOPY PROCEDURE REPORT  PATIENT: Margaret Mitchell, Margaret Mitchell  MR#: [id number] BIRTHDATE: 1940-09-05 , 74  yrs. old GENDER: female ENDOSCOPIST: Harl Bowie, MD REFERRED [id number] Elease Hashimoto, M.D. PROCEDURE DATE:  02/02/2015 PROCEDURE:   Colonoscopy, screening First Screening Colonoscopy - Avg.  risk and is 50 yrs.  old or older - No.  Prior Negative Screening - Now for repeat screening. 10 or more years since last screening  History of Adenoma - Now for follow-up colonoscopy & has been > or = to 3 yrs.  N/A  Polyps removed today? No Recommend repeat exam, <10 yrs? No ASA CLASS:   Class II INDICATIONS:Screening for colonic neoplasia and Colorectal Neoplasm Risk Assessment for this procedure is average risk. MEDICATIONS: Propofol 200 mg IV  DESCRIPTION OF PROCEDURE:   After the risks benefits and alternatives of the procedure were thoroughly explained, informed consent was obtained.  The digital rectal exam revealed no abnormalities of the rectum.   The LB [id number]  endoscope was introduced through the anus and advanced to the cecum, which was identified by both the appendix and ileocecal valve. No adverse events experienced.   The quality of the prep was good.  The instrument was then slowly withdrawn as the colon was fully examined. Estimated blood loss is zero unless otherwise noted in this procedure report.   COLON FINDINGS: A normal appearing cecum, ileocecal valve, and appendiceal orifice were identified.  The ascending, transverse, descending, sigmoid colon, and rectum appeared unremarkable. Small internal hemorrhoids were found.  Retroflexed views revealed internal hemorrhoids. The time to cecum = 3.4 Withdrawal time = 8.4 The scope was withdrawn and the procedure completed. COMPLICATIONS: There were no immediate complications.  ENDOSCOPIC IMPRESSION: 1.   Normal colonoscopy 2.   Small internal  hemorrhoids  RECOMMENDATIONS: You should continue to follow colorectal cancer screening guidelines for "routine risk" patients with a repeat colonoscopy in 10 years. There is no need for FOBT (stool) testing for at least 5 years.  eSigned:  Harl Bowie, MD 02/02/2015 4:23 PM

## 2015-02-03 ENCOUNTER — Telehealth: Payer: Self-pay | Admitting: *Deleted

## 2015-02-03 NOTE — Telephone Encounter (Signed)
  Follow up Call-  Call back number 02/02/2015  Post procedure Call Back phone  # [phone]  Permission to leave phone message Yes     Patient questions:  Do you have a fever, pain , or abdominal swelling? No. Pain Score  0 *  Have you tolerated food without any problems? Yes.    Have you been able to return to your normal activities? Yes.    Do you have any questions about your discharge instructions: Diet   No. Medications  No. Follow up visit  No.  Do you have questions or concerns about your Care? No.  Actions: * If pain score is 4 or above: No action needed, pain <4.

## 2015-02-21 DIAGNOSIS — H04123 Dry eye syndrome of bilateral lacrimal glands: Secondary | ICD-10-CM | POA: Diagnosis not present

## 2015-03-09 ENCOUNTER — Ambulatory Visit (INDEPENDENT_AMBULATORY_CARE_PROVIDER_SITE_OTHER): Payer: Medicare Other | Admitting: Family Medicine

## 2015-03-09 VITALS — BP 142/78 | HR 68 | Temp 97.9°F | Ht 66.0 in | Wt 212.0 lb

## 2015-03-09 DIAGNOSIS — I10 Essential (primary) hypertension: Secondary | ICD-10-CM | POA: Diagnosis not present

## 2015-03-09 DIAGNOSIS — R0609 Other forms of dyspnea: Secondary | ICD-10-CM | POA: Diagnosis not present

## 2015-03-09 NOTE — Progress Notes (Signed)
Subjective:    Patient ID: Margaret Mitchell, female    DOB: 1940-04-07, 75 y.o.   MRN: [id number]  HPI  Patient seen with onset about 3 days ago of some nonspecific lightheadedness and occasional somewhat intermittent headaches which are poorly localized. Headaches have been relatively mild. Denies any vertigo. No syncope or presyncopal symptoms. Lightheaded symptoms can occur both at rest and with exertion. She does not describe any consistent exertional chest symptoms. She has noted over the past couple weeks feeling more short of breath with things like climbing stairs but no chest pain whatsoever. No orthopnea. No known history of heart failure. No history of smoking. No chronic lung disease. Recent labs in November unremarkable  Hypertension treated with low-dose metoprolol and Lotrel. Blood pressures have been relatively stable. She does not describe any consistent orthostatic type symptoms.  Past Medical History  Diagnosis Date  . Allergy, unspecified not elsewhere classified   . IBS (irritable bowel syndrome)   . Headache(784.0)   . Pelvic adhesions   . Pelvic pain in female   . Sinus infection   . Anxiety   . Hypertension   . Osteoporosis   . Thyroid disease     Parathyroid cyst  . Cervical dysplasia     prior to hysterectomy  . Arthritis   . GERD (gastroesophageal reflux disease)    Past Surgical History  Procedure Laterality Date  . Parathyroid adenoma removed  02/1980  . Vesicovaginal fistula closure w/ tah    . Tonsillectomy    . Appendectomy  1994  . Tubal ligation  1981  . Rotator cuff repair      X2  . Foot surgery Bilateral   . Colposcopy    . Cataract extraction Bilateral 2013  . Shoulder injection  10/2011  . Abdominal hysterectomy  1983    TAH/BSO for pelvic pain and adhesions    reports that she has never smoked. She has never used smokeless tobacco. She reports that she drinks about 0.6 oz of alcohol per week. She reports that she does not use  illicit drugs. family history includes Diabetes in her maternal aunt, maternal grandmother, and mother; Heart disease in her maternal grandmother; Heart failure in her paternal grandmother; Hypertension in her maternal grandmother; Leukemia in her brother; Lung cancer in her father; Stroke in her maternal grandmother. Allergies  Allergen Reactions  . Prednisone     REACTION: heart palpatations  In larger doses  . Seldane [Terfenadine]     SELDANE  . Sulfonamide Derivatives     REACTION: pt unable to remember  . Vioxx [Rofecoxib]     REACTION: mouth swelling (VIOXX)     Review of Systems  Constitutional: Negative for fever and chills.  Respiratory: Negative for shortness of breath.   Gastrointestinal: Negative for nausea, vomiting and abdominal pain.  Genitourinary: Negative for dysuria.  Neurological: Positive for dizziness and light-headedness. Negative for seizures, syncope, speech difficulty and weakness.  Hematological: Negative for adenopathy.  Psychiatric/Behavioral: Negative for confusion.       Objective:   Physical Exam  Constitutional: She is oriented to person, place, and time. She appears well-developed and well-nourished.  HENT:  Head: Normocephalic and atraumatic.  Neck: Neck supple. No JVD present.  No carotid bruits  Cardiovascular: Normal rate and regular rhythm.  Exam reveals no gallop.   No murmur heard. Pulmonary/Chest: Effort normal and breath sounds normal. No respiratory distress. She has no wheezes. She has no rales.  Musculoskeletal: She exhibits no edema.  Neurological: She is alert and oriented to person, place, and time.          Assessment & Plan:  #1 hypertension. Stable. Repeat blood pressure left arm seated 142/78. Continue current medications  #2 recent nonspecific symptoms of lightheadedness and mild exertional dyspnea. No chest pains. Set up echocardiogram to further assess

## 2015-03-09 NOTE — Patient Instructions (Signed)
Dizziness Dizziness is a common problem. It is a feeling of unsteadiness or light-headedness. You may feel like you are about to faint. Dizziness can lead to injury if you stumble or fall. Anyone can become dizzy, but dizziness is more common in older adults. This condition can be caused by a number of things, including medicines, dehydration, or illness. HOME CARE INSTRUCTIONS Taking these steps may help with your condition: Eating and Drinking  Drink enough fluid to keep your urine clear or pale yellow. This helps to keep you from becoming dehydrated. Try to drink more clear fluids, such as water.  Do not drink alcohol.  Limit your caffeine intake if directed by your health care provider.  Limit your salt intake if directed by your health care provider. Activity  Avoid making quick movements.  Rise slowly from chairs and steady yourself until you feel okay.  In the morning, first sit up on the side of the bed. When you feel okay, stand slowly while you hold onto something until you know that your balance is fine.  Move your legs often if you need to stand in one place for a long time. Tighten and relax your muscles in your legs while you are standing.  Do not drive or operate heavy machinery if you feel dizzy.  Avoid bending down if you feel dizzy. Place items in your home so that they are easy for you to reach without leaning over. Lifestyle  Do not use any tobacco products, including cigarettes, chewing tobacco, or electronic cigarettes. If you need help quitting, ask your health care provider.  Try to reduce your stress level, such as with yoga or meditation. Talk with your health care provider if you need help. General Instructions  Watch your dizziness for any changes.  Take medicines only as directed by your health care provider. Talk with your health care provider if you think that your dizziness is caused by a medicine that you are taking.  Tell a friend or a family  member that you are feeling dizzy. If he or she notices any changes in your behavior, have this person call your health care provider.  Keep all follow-up visits as directed by your health care provider. This is important. SEEK MEDICAL CARE IF:  Your dizziness does not go away.  Your dizziness or light-headedness gets worse.  You feel nauseous.  You have reduced hearing.  You have new symptoms.  You are unsteady on your feet or you feel like the room is spinning. SEEK IMMEDIATE MEDICAL CARE IF:  You vomit or have diarrhea and are unable to eat or drink anything.  You have problems talking, walking, swallowing, or using your arms, hands, or legs.  You feel generally weak.  You are not thinking clearly or you have trouble forming sentences. It may take a friend or family member to notice this.  You have chest pain, abdominal pain, shortness of breath, or sweating.  Your vision changes.  You notice any bleeding.  You have a headache.  You have neck pain or a stiff neck.  You have a fever.   This information is not intended to replace advice given to you by your health care provider. Make sure you discuss any questions you have with your health care provider.   Document Released: 06/26/2000 Document Revised: 05/17/2014 Document Reviewed: 12/27/2013  We will call you with setting up the echocardiogram. [employer] Interactive Patient Education 2016 [employer]

## 2015-03-09 NOTE — Progress Notes (Signed)
Pre visit review using our clinic review tool, if applicable. No additional management support is needed unless otherwise documented below in the visit note. 

## 2015-03-27 ENCOUNTER — Other Ambulatory Visit: Payer: Self-pay

## 2015-03-27 ENCOUNTER — Ambulatory Visit (HOSPITAL_COMMUNITY): Payer: Medicare Other | Attending: Cardiology

## 2015-03-27 DIAGNOSIS — I119 Hypertensive heart disease without heart failure: Secondary | ICD-10-CM | POA: Diagnosis not present

## 2015-03-27 DIAGNOSIS — R0609 Other forms of dyspnea: Secondary | ICD-10-CM | POA: Insufficient documentation

## 2015-03-27 DIAGNOSIS — I340 Nonrheumatic mitral (valve) insufficiency: Secondary | ICD-10-CM | POA: Diagnosis not present

## 2015-03-27 DIAGNOSIS — I358 Other nonrheumatic aortic valve disorders: Secondary | ICD-10-CM | POA: Diagnosis not present

## 2015-03-27 DIAGNOSIS — R0600 Dyspnea, unspecified: Secondary | ICD-10-CM | POA: Diagnosis present

## 2015-03-28 ENCOUNTER — Encounter: Payer: Self-pay | Admitting: Adult Health

## 2015-03-28 ENCOUNTER — Ambulatory Visit (INDEPENDENT_AMBULATORY_CARE_PROVIDER_SITE_OTHER): Payer: Medicare Other | Admitting: Adult Health

## 2015-03-28 VITALS — BP 100/60 | HR 91 | Temp 100.1°F | Ht 66.0 in | Wt 210.3 lb

## 2015-03-28 DIAGNOSIS — J069 Acute upper respiratory infection, unspecified: Secondary | ICD-10-CM

## 2015-03-28 MED ORDER — METHYLPREDNISOLONE 4 MG PO TBPK: ORAL_TABLET | ORAL | Status: DC

## 2015-03-28 NOTE — Progress Notes (Signed)
Subjective:    Patient ID: Margaret Mitchell, female    DOB: [birth year], 75 y.o.   MRN: [id number]  HPI 75 year old female who presents to the office today for headache, dry cough and dry red eyes. Her symptoms started on Sunday with the dry red eyes and cough. The redness in her eyes has since resolved. She has been applying  eye drops to her eyes. She continues with the cough, headaches,fever and chills.  Has tried various OTC cough and cold medications   Review of Systems  Constitutional: Positive for fever, chills, diaphoresis, activity change and fatigue.  HENT: Positive for postnasal drip, rhinorrhea and sinus pressure. Negative for sore throat and trouble swallowing.   Respiratory: Positive for cough. Negative for shortness of breath and wheezing.   Musculoskeletal: Negative.   Skin: Negative.   Neurological: Positive for light-headedness and headaches. Negative for dizziness.  Hematological: Negative.    Past Medical History  Diagnosis Date  . Allergy, unspecified not elsewhere classified   . IBS (irritable bowel syndrome)   . Headache(784.0)   . Pelvic adhesions   . Pelvic pain in female   . Sinus infection   . Anxiety   . Hypertension   . Osteoporosis   . Thyroid disease     Parathyroid cyst  . Cervical dysplasia     prior to hysterectomy  . Arthritis   . GERD (gastroesophageal reflux disease)     Social History   Social History  . Marital Status: Married    Spouse Name: Charlann Noss  . Number of Children: 1  . Years of Education: N/A   Occupational History  . retired    Social History Main Topics  . Smoking status: Never Smoker   . Smokeless tobacco: Never Used  . Alcohol Use: 0.6 oz/week    1 Standard drinks or equivalent per week     Comment: Rare  . Drug Use: No  . Sexual Activity: No     Comment: 1st intercourse 75 yo-Fewer than 5 partners   Other Topics Concern  . Not on file   Social History Narrative   Lives with husband.   Retired from  working Barrister's clerk at [employer]   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    Past Surgical History  Procedure Laterality Date  . Parathyroid adenoma removed  02/1980  . Vesicovaginal fistula closure w/ tah    . Tonsillectomy    . Appendectomy  1994  . Tubal ligation  1981  . Rotator cuff repair      X2  . Foot surgery Bilateral   . Colposcopy    . Cataract extraction Bilateral 2013  . Shoulder injection  10/2011  . Abdominal hysterectomy  1983    TAH/BSO for pelvic pain and adhesions    Family History  Problem Relation Age of Onset  . Diabetes Mother   . Lung cancer Father   . Leukemia Brother   . Hypertension Maternal Grandmother   . Heart disease Maternal Grandmother   . Diabetes Maternal Grandmother   . Stroke Maternal Grandmother   . Diabetes Maternal Aunt   . Heart failure Paternal Grandmother     Allergies  Allergen Reactions  . Prednisone     REACTION: heart palpatations  In larger doses  . Seldane [Terfenadine]     SELDANE  . Sulfonamide Derivatives     REACTION: pt unable to remember  . Vioxx [Rofecoxib]     REACTION: mouth swelling (VIOXX)  Current Outpatient Prescriptions on File Prior to Visit  Medication Sig Dispense Refill  . amLODipine-benazepril (LOTREL) 5-20 MG per capsule TAKE 1 CAPSULE DAILY 90 capsule 2  . aspirin 81 MG tablet Take 81 mg by mouth daily.      . cholecalciferol (VITAMIN D) 1000 UNITS tablet Take 1,000 Units by mouth daily.    Marland Kitchen CINNAMON PO Take 2,000 mg by mouth daily.    . metoprolol succinate (TOPROL-XL) 100 MG 24 hr tablet Take 50 mg by mouth daily. Take one half tablet once daily    . Nutritional Supplements (ESTROVEN PO) Take 1 capsule by mouth daily.    . Omega-3 Fatty Acids (FISH OIL) 1200 MG CPDR Take 2 tablets by mouth daily.     No current facility-administered medications on file prior to visit.    BP 100/60 mmHg  Pulse 91  Temp(Src) 100.1 F (37.8 C) (Oral)  Ht 5\' 6"  (1.676 m)  Wt 210 lb 4.8 oz  (95.391 kg)  BMI 33.96 kg/m2  SpO2 96%       Objective:   Physical Exam  Constitutional: She is oriented to person, place, and time. She appears well-developed and well-nourished. No distress.  HENT:  Head: Normocephalic and atraumatic.  Right Ear: External ear normal.  Left Ear: External ear normal.  Nose: Nose normal.  Mouth/Throat: Oropharynx is clear and moist. No oropharyngeal exudate.  Eyes: Conjunctivae and EOM are normal. Pupils are equal, round, and reactive to light. Right eye exhibits no discharge. Left eye exhibits no discharge. No scleral icterus.  No red eyes  Neck: Normal range of motion. Neck supple. No thyromegaly present.  Cardiovascular: Normal rate, regular rhythm, normal heart sounds and intact distal pulses.  Exam reveals no gallop and no friction rub.   No murmur heard. Pulmonary/Chest: Effort normal and breath sounds normal. No respiratory distress. She has no wheezes. She has no rales. She exhibits no tenderness.  Musculoskeletal: Normal range of motion. She exhibits no edema or tenderness.  Lymphadenopathy:    She has no cervical adenopathy.  Neurological: She is alert and oriented to person, place, and time. No cranial nerve deficit. Coordination normal.  Skin: Skin is warm and dry. No rash noted. She is not diaphoretic. No erythema. No pallor.  Psychiatric: She has a normal mood and affect. Her behavior is normal. Judgment and thought content normal.  Nursing note and vitals reviewed.     Assessment & Plan:  1. Acute upper respiratory infection - Likely viral  - Flonase - Tylenol for symptom management.  - Flu swab was negative.  - methylPREDNISolone (MEDROL DOSEPAK) 4 MG TBPK tablet; Take as directed  Dispense: 21 tablet; Refill: 0 - POC Influenza A/B - Stay hydrated  - Follow up if no improvement.

## 2015-03-28 NOTE — Progress Notes (Signed)
Pre visit review using our clinic review tool, if applicable. No additional management support is needed unless otherwise documented below in the visit note. 

## 2015-03-28 NOTE — Patient Instructions (Addendum)
It was great meeting you today!  Your exam is consistent with a viral upper respiratory infection.  I have sent in a prescription for prednisone, take as directed.   Use mucinex cough during the day to help with the cough   Take tylenol for symptom management.     Upper Respiratory Infection, Adult Most upper respiratory infections (URIs) are a viral infection of the air passages leading to the lungs. A URI affects the nose, throat, and upper air passages. The most common type of URI is nasopharyngitis and is typically referred to as "the common cold." URIs run their course and usually go away on their own. Most of the time, a URI does not require medical attention, but sometimes a bacterial infection in the upper airways can follow a viral infection. This is called a secondary infection. Sinus and middle ear infections are common types of secondary upper respiratory infections. Bacterial pneumonia can also complicate a URI. A URI can worsen asthma and chronic obstructive pulmonary disease (COPD). Sometimes, these complications can require emergency medical care and may be life threatening.  CAUSES Almost all URIs are caused by viruses. A virus is a type of germ and can spread from one person to another.  RISKS FACTORS You may be at risk for a URI if:   You smoke.   You have chronic heart or lung disease.  You have a weakened defense (immune) system.   You are very young or very old.   You have nasal allergies or asthma.  You work in crowded or poorly ventilated areas.  You work in health care facilities or schools. SIGNS AND SYMPTOMS  Symptoms typically develop 2-3 days after you come in contact with a cold virus. Most viral URIs last 7-10 days. However, viral URIs from the influenza virus (flu virus) can last 14-18 days and are typically more severe. Symptoms may include:   Runny or stuffy (congested) nose.   Sneezing.   Cough.   Sore throat.   Headache.    Fatigue.   Fever.   Loss of appetite.   Pain in your forehead, behind your eyes, and over your cheekbones (sinus pain).  Muscle aches.  DIAGNOSIS  Your health care provider may diagnose a URI by:  Physical exam.  Tests to check that your symptoms are not due to another condition such as:  Strep throat.  Sinusitis.  Pneumonia.  Asthma. TREATMENT  A URI goes away on its own with time. It cannot be cured with medicines, but medicines may be prescribed or recommended to relieve symptoms. Medicines may help:  Reduce your fever.  Reduce your cough.  Relieve nasal congestion. HOME CARE INSTRUCTIONS   Take medicines only as directed by your health care provider.   Gargle warm saltwater or take cough drops to comfort your throat as directed by your health care provider.  Use a warm mist humidifier or inhale steam from a shower to increase air moisture. This may make it easier to breathe.  Drink enough fluid to keep your urine clear or pale yellow.   Eat soups and other clear broths and maintain good nutrition.   Rest as needed.   Return to work when your temperature has returned to normal or as your health care provider advises. You may need to stay home longer to avoid infecting others. You can also use a face mask and careful hand washing to prevent spread of the virus.  Increase the usage of your inhaler if you have asthma.  Do not use any tobacco products, including cigarettes, chewing tobacco, or electronic cigarettes. If you need help quitting, ask your health care provider. PREVENTION  The best way to protect yourself from getting a cold is to practice good hygiene.   Avoid oral or hand contact with people with cold symptoms.   Wash your hands often if contact occurs.  There is no clear evidence that vitamin C, vitamin E, echinacea, or exercise reduces the chance of developing a cold. However, it is always recommended to get plenty of rest,  exercise, and practice good nutrition.  SEEK MEDICAL CARE IF:   You are getting worse rather than better.   Your symptoms are not controlled by medicine.   You have chills.  You have worsening shortness of breath.  You have brown or red mucus.  You have yellow or brown nasal discharge.  You have pain in your face, especially when you bend forward.  You have a fever.  You have swollen neck glands.  You have pain while swallowing.  You have white areas in the back of your throat. SEEK IMMEDIATE MEDICAL CARE IF:   You have severe or persistent:  Headache.  Ear pain.  Sinus pain.  Chest pain.  You have chronic lung disease and any of the following:  Wheezing.  Prolonged cough.  Coughing up blood.  A change in your usual mucus.  You have a stiff neck.  You have changes in your:  Vision.  Hearing.  Thinking.  Mood. MAKE SURE YOU:   Understand these instructions.  Will watch your condition.  Will get help right away if you are not doing well or get worse.   This information is not intended to replace advice given to you by your health care provider. Make sure you discuss any questions you have with your health care provider.   Document Released: 06/26/2000 Document Revised: 05/17/2014 Document Reviewed: 04/07/2013 Elsevier Interactive Patient Education [employer].

## 2015-03-31 LAB — POCT INFLUENZA A: Rapid Influenza A Ag: POSITIVE

## 2015-03-31 NOTE — Addendum Note (Signed)
Addended by: Colleen Can on: 03/31/2015 04:01 PM   Modules accepted: Orders

## 2015-04-17 ENCOUNTER — Telehealth: Payer: Self-pay | Admitting: Family Medicine

## 2015-04-17 NOTE — Telephone Encounter (Signed)
Left detailed message on personal voicemail Rx ready for pickup, will be at the front desk.

## 2015-04-17 NOTE — Telephone Encounter (Signed)
Dr.Burchette, pt would like a refill on her Nexium 40 mg , was originally ordered by Dr. Lenna Gilford. Okay to order?

## 2015-04-17 NOTE — Telephone Encounter (Signed)
written

## 2015-04-17 NOTE — Telephone Encounter (Signed)
Pt request refill  esomeprazole (NEXIUM) 40 MG capsule [id number] 90 day with refills  Pt would like hand written Rx to take to [facility]. Pt has not had filled with Dr Elease Hashimoto yet, was a former pt of Dr Lenna Gilford.

## 2015-05-03 ENCOUNTER — Encounter: Payer: Self-pay | Admitting: Family Medicine

## 2015-05-03 ENCOUNTER — Ambulatory Visit (INDEPENDENT_AMBULATORY_CARE_PROVIDER_SITE_OTHER): Payer: Medicare Other | Admitting: Family Medicine

## 2015-05-03 VITALS — BP 124/90 | HR 72 | Temp 98.0°F | Ht 66.0 in | Wt 209.0 lb

## 2015-05-03 DIAGNOSIS — M545 Low back pain, unspecified: Secondary | ICD-10-CM

## 2015-05-03 DIAGNOSIS — G609 Hereditary and idiopathic neuropathy, unspecified: Secondary | ICD-10-CM | POA: Diagnosis not present

## 2015-05-03 DIAGNOSIS — G629 Polyneuropathy, unspecified: Secondary | ICD-10-CM | POA: Diagnosis not present

## 2015-05-03 DIAGNOSIS — Z833 Family history of diabetes mellitus: Secondary | ICD-10-CM

## 2015-05-03 LAB — HEMOGLOBIN A1C: HEMOGLOBIN A1C: 6.1 % (ref 4.6–6.5)

## 2015-05-03 LAB — VITAMIN B12: VITAMIN B 12: 367 pg/mL (ref 211–911)

## 2015-05-03 NOTE — Progress Notes (Signed)
Pre visit review using our clinic review tool, if applicable. No additional management support is needed unless otherwise documented below in the visit note. 

## 2015-05-03 NOTE — Progress Notes (Signed)
Subjective:    Patient ID: Margaret Mitchell, female    DOB: [birth year], 75 y.o.   MRN: [id number]  HPI   Patient has past medical history of restless leg syndrome, osteopenia,  Obesity, IBS, hyperlipidemia, GERD, hypertension  She is seen today with low back pain and bilateral sensory changes in both lower extremities. She states that on April 3 she went out to get her trash can. She noticed some mild pain mid lower lumbar region noted the midline. Denies any radiculopathy symptoms at that time. Low back pain 4 out of 10 severity and achy quality.   On the seventh of this month she developed some mild partial numbness involving both lower extremities from the thigh down to the lower leg. Very symmetric distribution. No associated weakness. No loss of bladder or bowel control. No exacerbating or alleviating factors. She apparently had somewhat similar issues in the past and saw a neurologist back in 2013. EMG studies at that time did confirm peripheral neuropathy. She has no history of diabetes. No alcohol use. Previous thyroid testing normal. B12 testing back in 2012 normal but no check since then. Does take chronic Nexium. Also hypertension treated with Lotrel. Also on metoprolol. No prior history of chemotherapy. No heavy metal exposure.  Past Medical History  Diagnosis Date  . Allergy, unspecified not elsewhere classified   . IBS (irritable bowel syndrome)   . Headache(784.0)   . Pelvic adhesions   . Pelvic pain in female   . Sinus infection   . Anxiety   . Hypertension   . Osteoporosis   . Thyroid disease     Parathyroid cyst  . Cervical dysplasia     prior to hysterectomy  . Arthritis   . GERD (gastroesophageal reflux disease)    Past Surgical History  Procedure Laterality Date  . Parathyroid adenoma removed  02/1980  . Vesicovaginal fistula closure w/ tah    . Tonsillectomy    . Appendectomy  1994  . Tubal ligation  1981  . Rotator cuff repair      X2  . Foot surgery  Bilateral   . Colposcopy    . Cataract extraction Bilateral 2013  . Shoulder injection  10/2011  . Abdominal hysterectomy  1983    TAH/BSO for pelvic pain and adhesions    reports that she has never smoked. She has never used smokeless tobacco. She reports that she drinks about 0.6 oz of alcohol per week. She reports that she does not use illicit drugs. family history includes Diabetes in her maternal aunt, maternal grandmother, and mother; Heart disease in her maternal grandmother; Heart failure in her paternal grandmother; Hypertension in her maternal grandmother; Leukemia in her brother; Lung cancer in her father; Stroke in her maternal grandmother. Allergies  Allergen Reactions  . Prednisone     REACTION: heart palpatations  In larger doses  . Seldane [Terfenadine]     SELDANE  . Sulfonamide Derivatives     REACTION: pt unable to remember  . Vioxx [Rofecoxib]     REACTION: mouth swelling (VIOXX)      Review of Systems  Constitutional: Negative for fever, chills, fatigue and unexpected weight change.  Eyes: Negative for visual disturbance.  Respiratory: Negative for cough, chest tightness, shortness of breath and wheezing.   Cardiovascular: Negative for chest pain, palpitations and leg swelling.  Gastrointestinal: Negative for abdominal pain.  Endocrine: Negative for polydipsia and polyuria.  Musculoskeletal: Positive for back pain.  Skin: Negative for rash.  Neurological:  Positive for numbness. Negative for dizziness, seizures, syncope, weakness, light-headedness and headaches.  Hematological: Negative for adenopathy.       Objective:   Physical Exam  Constitutional: She appears well-developed and well-nourished.  Cardiovascular: Normal rate and regular rhythm.   Pulmonary/Chest: Effort normal and breath sounds normal. No respiratory distress. She has no wheezes. She has no rales.  Musculoskeletal: She exhibits no edema.  Straight leg raises are negative  bilaterally. No localized tenderness lower lumbar spine.  Neurological:  Absence of knee reflex bilaterally which she states is chronic. Trace ankle reflex bilaterally. Full-strength with plantar flexion, dorsiflexion, knee extension, and hip flexion bilaterally. Minimal subjective impairment to sensation lower legs and thighs bilaterally          Assessment & Plan:   Bilateral sensory neuropathy symptoms both lower extremities as above.   Prior history of EMG study (2013) which confirmed neuropathy. Symptoms were actually stable until couple weeks ago as above. She does have some mild low back pain but not sure these are connected. No history of diabetes. Check SPEP, B12, A1c. If normal and symptoms persist consider MRI lumbar spine.

## 2015-05-03 NOTE — Patient Instructions (Signed)

## 2015-05-05 ENCOUNTER — Other Ambulatory Visit: Payer: Self-pay | Admitting: Family Medicine

## 2015-05-05 LAB — PROTEIN ELECTROPHORESIS, SERUM
ALPHA-2-GLOBULIN: 0.8 g/dL (ref 0.5–0.9)
Albumin ELP: 3.8 g/dL (ref 3.8–4.8)
Alpha-1-Globulin: 0.3 g/dL (ref 0.2–0.3)
BETA GLOBULIN: 0.4 g/dL (ref 0.4–0.6)
Beta 2: 0.4 g/dL (ref 0.2–0.5)
GAMMA GLOBULIN: 1.2 g/dL (ref 0.8–1.7)
Total Protein, Serum Electrophoresis: 6.9 g/dL (ref 6.1–8.1)

## 2015-05-17 DIAGNOSIS — M2040 Other hammer toe(s) (acquired), unspecified foot: Secondary | ICD-10-CM | POA: Diagnosis not present

## 2015-06-26 ENCOUNTER — Ambulatory Visit (INDEPENDENT_AMBULATORY_CARE_PROVIDER_SITE_OTHER): Payer: Medicare Other | Admitting: Family Medicine

## 2015-06-26 VITALS — BP 130/88 | HR 70 | Temp 97.9°F

## 2015-06-26 DIAGNOSIS — R519 Headache, unspecified: Secondary | ICD-10-CM

## 2015-06-26 DIAGNOSIS — G629 Polyneuropathy, unspecified: Secondary | ICD-10-CM | POA: Diagnosis not present

## 2015-06-26 DIAGNOSIS — R51 Headache: Secondary | ICD-10-CM

## 2015-06-26 DIAGNOSIS — G2581 Restless legs syndrome: Secondary | ICD-10-CM | POA: Diagnosis not present

## 2015-06-26 MED ORDER — GABAPENTIN 100 MG PO CAPS
100.0000 mg | ORAL_CAPSULE | Freq: Two times a day (BID) | ORAL | Status: DC
Start: 2015-06-26 — End: 2015-11-01

## 2015-06-26 NOTE — Progress Notes (Signed)
Pre visit review using our clinic review tool, if applicable. No additional management support is needed unless otherwise documented below in the visit note. 

## 2015-06-26 NOTE — Patient Instructions (Signed)
Start the gabapentin at 100 mg one at night After 2-3 days if no relief may increase to one twice daily.

## 2015-06-26 NOTE — Progress Notes (Signed)
Subjective:    Patient ID: Margaret Mitchell, female    DOB: [birth year], 75 y.o.   MRN: [id number]  HPI Patient seen for follow-up regarding bilateral leg pain. She describes this as more of a "discomfort" than a pain. Severity is 6 out of 10. She's had previous evaluation per neurology with sensory neuropathy. No history of any weakness. Refer to recent note:   "On the seventh of this month she developed some mild partial numbness involving both lower extremities from the thigh down to the lower leg. Very symmetric distribution. No associated weakness. No loss of bladder or bowel control. No exacerbating or alleviating factors. She apparently had somewhat similar issues in the past and saw a neurologist back in 2013. EMG studies at that time did confirm peripheral neuropathy. She has no history of diabetes. No alcohol use. Previous thyroid testing normal. B12 testing back in 2012 normal but no check since then. Does take chronic Nexium. Also hypertension treated with Lotrel. Also on metoprolol. No prior history of chemotherapy. No heavy metal exposure"  A1c 6.1%, B12 normal, serum protein electrophoresis normal.  No ETOH use.  She had taking gabapentin previously but at higher dose felt "depressed ". She had same side effects with higher dose Lyrica.  She did tolerate 100 mg dose of Gabapentin.  She also complains some right-sided headache. She's had similar headache in the past and is seen headache specialist. She describes what sounds like torticollis May 9. This eventually improved with heat, massage, and some vibration therapy. She had had a recurrence as past Thursday. No visual changes. Mild headache right parietal occipital area. No visual changes. No focal weakness. No associated nausea or vomiting. No skin rashes.  Past Medical History  Diagnosis Date  . Allergy, unspecified not elsewhere classified   . IBS (irritable bowel syndrome)   . Headache(784.0)   . Pelvic adhesions   .  Pelvic pain in female   . Sinus infection   . Anxiety   . Hypertension   . Osteoporosis   . Thyroid disease     Parathyroid cyst  . Cervical dysplasia     prior to hysterectomy  . Arthritis   . GERD (gastroesophageal reflux disease)    Past Surgical History  Procedure Laterality Date  . Parathyroid adenoma removed  02/1980  . Vesicovaginal fistula closure w/ tah    . Tonsillectomy    . Appendectomy  1994  . Tubal ligation  1981  . Rotator cuff repair      X2  . Foot surgery Bilateral   . Colposcopy    . Cataract extraction Bilateral 2013  . Shoulder injection  10/2011  . Abdominal hysterectomy  1983    TAH/BSO for pelvic pain and adhesions    reports that she has never smoked. She has never used smokeless tobacco. She reports that she drinks about 0.6 oz of alcohol per week. She reports that she does not use illicit drugs. family history includes Diabetes in her maternal aunt, maternal grandmother, and mother; Heart disease in her maternal grandmother; Heart failure in her paternal grandmother; Hypertension in her maternal grandmother; Leukemia in her brother; Lung cancer in her father; Stroke in her maternal grandmother. Allergies  Allergen Reactions  . Prednisone     REACTION: heart palpatations  In larger doses  . Seldane [Terfenadine]     SELDANE  . Sulfonamide Derivatives     REACTION: pt unable to remember  . Vioxx [Rofecoxib]     REACTION: mouth  swelling (VIOXX)     Review of Systems  Constitutional: Negative for fatigue.  HENT: Negative for congestion and sinus pressure.   Eyes: Negative for visual disturbance.  Respiratory: Negative for cough, chest tightness, shortness of breath and wheezing.   Cardiovascular: Negative for chest pain, palpitations and leg swelling.  Gastrointestinal: Negative for abdominal pain.  Skin: Negative for rash.  Neurological: Positive for headaches (See history of present illness). Negative for dizziness, seizures, syncope,  weakness and light-headedness.       Objective:   Physical Exam  Constitutional: She appears well-developed and well-nourished.  HENT:  Right Ear: External ear normal.  Left Ear: External ear normal.  Mouth/Throat: Oropharynx is clear and moist.  Eyes: Pupils are equal, round, and reactive to light.  Cardiovascular: Normal rate and regular rhythm.   No murmur heard. Pulmonary/Chest: Effort normal and breath sounds normal. No respiratory distress. She has no wheezes. She has no rales.  Musculoskeletal: She exhibits no edema.  Straight leg raise are negative bilaterally.  Neurological:  Knee reflexes are difficult to elicit. Trace ankle bilaterally. Full-strength lower extremities with normal sensory function to touch  Skin: No rash noted.          Assessment & Plan:  #1 bilateral sensory neuropathy. No evidence for any weakness. Recent thyroid function testing, diabetes testing, B12, and SPEP all normal. We discussed options. She's having mostly night discomfort. We recommended trial of low-dose gabapentin 100 mg daily at bedtime and may titrate to one twice a day after 3 or 4 days if no improvement  #2 unilateral headache. Right-sided. She does not have any red flags such as visual change or any focal weakness. She's had long-standing history of similar headache in the past. May benefit from gabapentin as above. Follow-up immediately for any vision change, worsening headache or new symptoms  Kristian Covey MD [facility]

## 2015-07-11 DIAGNOSIS — M7581 Other shoulder lesions, right shoulder: Secondary | ICD-10-CM | POA: Diagnosis not present

## 2015-07-11 DIAGNOSIS — M542 Cervicalgia: Secondary | ICD-10-CM | POA: Diagnosis not present

## 2015-07-20 DIAGNOSIS — L821 Other seborrheic keratosis: Secondary | ICD-10-CM | POA: Diagnosis not present

## 2015-07-20 DIAGNOSIS — L819 Disorder of pigmentation, unspecified: Secondary | ICD-10-CM | POA: Diagnosis not present

## 2015-07-27 ENCOUNTER — Telehealth: Payer: Self-pay | Admitting: Family Medicine

## 2015-07-27 DIAGNOSIS — G629 Polyneuropathy, unspecified: Secondary | ICD-10-CM

## 2015-07-27 NOTE — Telephone Encounter (Signed)
Pt would like a referral to a specialist for the numbness in her legs from the knee down. Pt states she has seen DR Elease Hashimoto for this issue several times and not any better.

## 2015-07-28 NOTE — Telephone Encounter (Signed)
We can go ahead and refer to neurology- though not sure anyone can "fix" her neuropathy.

## 2015-07-28 NOTE — Telephone Encounter (Signed)
Pt recently seen for this issue on 06/26/2015. Please advise on referral?

## 2015-07-28 NOTE — Telephone Encounter (Signed)
Husband is aware that referral has been placed.

## 2015-08-30 ENCOUNTER — Ambulatory Visit: Payer: Medicare Other | Admitting: Neurology

## 2015-09-12 DIAGNOSIS — H26493 Other secondary cataract, bilateral: Secondary | ICD-10-CM | POA: Diagnosis not present

## 2015-09-12 DIAGNOSIS — H35033 Hypertensive retinopathy, bilateral: Secondary | ICD-10-CM | POA: Diagnosis not present

## 2015-09-12 DIAGNOSIS — H04123 Dry eye syndrome of bilateral lacrimal glands: Secondary | ICD-10-CM | POA: Diagnosis not present

## 2015-09-12 DIAGNOSIS — Z961 Presence of intraocular lens: Secondary | ICD-10-CM | POA: Diagnosis not present

## 2015-09-12 DIAGNOSIS — H524 Presbyopia: Secondary | ICD-10-CM | POA: Diagnosis not present

## 2015-09-28 ENCOUNTER — Ambulatory Visit (INDEPENDENT_AMBULATORY_CARE_PROVIDER_SITE_OTHER): Payer: Medicare Other

## 2015-09-28 DIAGNOSIS — Z23 Encounter for immunization: Secondary | ICD-10-CM | POA: Diagnosis not present

## 2015-10-10 ENCOUNTER — Other Ambulatory Visit: Payer: Self-pay | Admitting: Family Medicine

## 2015-11-01 ENCOUNTER — Other Ambulatory Visit: Payer: Self-pay | Admitting: Family Medicine

## 2015-11-01 ENCOUNTER — Ambulatory Visit (INDEPENDENT_AMBULATORY_CARE_PROVIDER_SITE_OTHER): Payer: Medicare Other | Admitting: Family Medicine

## 2015-11-01 VITALS — BP 150/74 | HR 69 | Temp 97.8°F

## 2015-11-01 DIAGNOSIS — R079 Chest pain, unspecified: Secondary | ICD-10-CM

## 2015-11-01 NOTE — Patient Instructions (Signed)
Go for CXR tomorrow if possible Follow up immediately for any shortness of breath, progressive or worsening chest pain or any other new symptoms.

## 2015-11-01 NOTE — Progress Notes (Addendum)
Subjective:     Patient ID: Margaret Mitchell, female   DOB: 03-20-1940, 75 y.o.   MRN: [id number]  HPI Patient seen with three-week history of intermittent left sided chest discomfort. She states she has a "bloated feeling "which usually last 20- 30 minutes and is actually worse at rest and nonexertional. Also tends to be worse when lying on her left side. She has not had any fevers or chills. No cough. No pleuritic pain. No lymphadenopathy. She denies any breast tenderness. Appetite and weight have been stable. No cardiac history. Denies any recent GERD symptoms.  She had echocardiogram March 2017 with no significant abnormalities and ejection fraction 60-65%. Never smoked. Denies breast pain. Is scheduled for mammogram next week. Under increased stress with her husband's recent health concerns.  Past Medical History:  Diagnosis Date  . Allergy, unspecified not elsewhere classified   . Anxiety   . Arthritis   . Cervical dysplasia    prior to hysterectomy  . GERD (gastroesophageal reflux disease)   . Headache(784.0)   . Hypertension   . IBS (irritable bowel syndrome)   . Osteoporosis   . Pelvic adhesions   . Pelvic pain in female   . Sinus infection   . Thyroid disease    Parathyroid cyst   Past Surgical History:  Procedure Laterality Date  . ABDOMINAL HYSTERECTOMY  1983   TAH/BSO for pelvic pain and adhesions  . APPENDECTOMY  1994  . CATARACT EXTRACTION Bilateral 2013  . COLPOSCOPY    . FOOT SURGERY Bilateral   . PARATHYROID ADENOMA REMOVED  02/1980  . ROTATOR CUFF REPAIR     X2  . SHOULDER INJECTION  10/2011  . TONSILLECTOMY    . TUBAL LIGATION  1981  . VESICOVAGINAL FISTULA CLOSURE W/ TAH      reports that she has never smoked. She has never used smokeless tobacco. She reports that she drinks about 0.6 oz of alcohol per week . She reports that she does not use drugs. family history includes Diabetes in her maternal aunt, maternal grandmother, and mother; Heart disease  in her maternal grandmother; Heart failure in her paternal grandmother; Hypertension in her maternal grandmother; Leukemia in her brother; Lung cancer in her father; Stroke in her maternal grandmother. Allergies  Allergen Reactions  . Prednisone     REACTION: heart palpatations  In larger doses  . Seldane [Terfenadine]     SELDANE  . Sulfonamide Derivatives     REACTION: pt unable to remember  . Vioxx [Rofecoxib]     REACTION: mouth swelling (VIOXX)    Review of Systems  Constitutional: Negative for appetite change, chills, fever and unexpected weight change.  HENT: Negative for trouble swallowing.   Respiratory: Negative for cough, shortness of breath and wheezing.   Cardiovascular: Positive for chest pain. Negative for palpitations and leg swelling.  Gastrointestinal: Negative for abdominal pain.  Skin: Negative for rash.  Neurological: Negative for dizziness.  Hematological: Negative for adenopathy.       Objective:   Physical Exam  Constitutional: She appears well-developed and well-nourished.  Cardiovascular: Normal rate and regular rhythm.  Exam reveals no gallop.   Pulmonary/Chest: Effort normal and breath sounds normal. No respiratory distress. She has no wheezes. She has no rales.  Breasts are symmetric with no mass. No nipple inversion. No skin dimpling. No left axillary adenopathy.  Abdominal: Soft. There is no tenderness.  Musculoskeletal: She exhibits no edema.  Skin: No rash noted.       Assessment:  Atypical left-sided chest pain. Etiology unclear. She has never had any exertional symptoms. Doubt cardiac.  Nonfocal exam.  Doubt GI.      Plan:     -Obtain EKG and chest x-ray to further evaluate -EKG NSR with no acute changes -she will go for CXR tomorrow. -If normal CXR, consider nuclear stress test to further evaluate.  Kristian Covey MD [facility] at [facility]  CXR unremarkable.  Will set up nuclear stress test, as discussed, to  further assess.  Kristian Covey MD [facility] at [facility]

## 2015-11-01 NOTE — Progress Notes (Signed)
Pre visit review using our clinic review tool, if applicable. No additional management support is needed unless otherwise documented below in the visit note. 

## 2015-11-02 ENCOUNTER — Ambulatory Visit (INDEPENDENT_AMBULATORY_CARE_PROVIDER_SITE_OTHER)
Admission: RE | Admit: 2015-11-02 | Discharge: 2015-11-02 | Disposition: A | Discharge status: Home / Self Care | Payer: Medicare Other | Source: Ambulatory Visit | Attending: Family Medicine | Admitting: Family Medicine

## 2015-11-02 DIAGNOSIS — R079 Chest pain, unspecified: Secondary | ICD-10-CM

## 2015-11-02 DIAGNOSIS — R0789 Other chest pain: Secondary | ICD-10-CM | POA: Diagnosis not present

## 2015-11-03 NOTE — Addendum Note (Signed)
Addended by: Eulas Post on: 11/03/2015 07:32 PM   Modules accepted: Orders

## 2015-11-07 ENCOUNTER — Other Ambulatory Visit: Payer: Self-pay

## 2015-11-13 DIAGNOSIS — L84 Corns and callosities: Secondary | ICD-10-CM | POA: Diagnosis not present

## 2015-11-13 DIAGNOSIS — G5761 Lesion of plantar nerve, right lower limb: Secondary | ICD-10-CM | POA: Diagnosis not present

## 2015-11-13 DIAGNOSIS — M2040 Other hammer toe(s) (acquired), unspecified foot: Secondary | ICD-10-CM | POA: Diagnosis not present

## 2015-11-23 ENCOUNTER — Encounter: Payer: Self-pay | Admitting: Gynecology

## 2015-11-23 ENCOUNTER — Ambulatory Visit (INDEPENDENT_AMBULATORY_CARE_PROVIDER_SITE_OTHER): Payer: Medicare Other | Admitting: Gynecology

## 2015-11-23 VITALS — BP 130/82 | Ht 67.0 in | Wt 207.0 lb

## 2015-11-23 DIAGNOSIS — Z01419 Encounter for gynecological examination (general) (routine) without abnormal findings: Secondary | ICD-10-CM

## 2015-11-23 DIAGNOSIS — M8580 Other specified disorders of bone density and structure, unspecified site: Secondary | ICD-10-CM

## 2015-11-23 DIAGNOSIS — N952 Postmenopausal atrophic vaginitis: Secondary | ICD-10-CM | POA: Diagnosis not present

## 2015-11-23 NOTE — Progress Notes (Signed)
Margaret Mitchell November 14, 1940 [id number]        76 y.o.  G1P0010  for breast and pelvic exam. Several issues noted below.   Past medical history,surgical history, problem list, medications, allergies, family history and social history were all reviewed and documented as reviewed in the EPIC chart.  ROS:  Performed with pertinent positives and negatives included in the history, assessment and plan.   Additional significant findings :  None   Exam: Kennon Portela assistant Vitals:   11/23/15 0939  BP: 130/82  Weight: 207 lb (93.9 kg)  Height: 5\' 7"  (1.702 m)   Body mass index is 32.42 kg/m.  General appearance:  Normal affect, orientation and appearance. Skin: Grossly normal HEENT: Without gross lesions.  No cervical or supraclavicular adenopathy. Thyroid normal.  Lungs:  Clear without wheezing, rales or rhonchi Cardiac: RR, without RMG Abdominal:  Soft, nontender, without masses, guarding, rebound, organomegaly or hernia Breasts:  Examined lying and sitting without masses, retractions, discharge or axillary adenopathy. Pelvic:  Ext, BUS, Vagina with atrophic changes  Adnexa without masses or tenderness    Anus and perineum normal   Rectovaginal normal sphincter tone without palpated masses or tenderness.    Assessment/Plan:  75 y.o. G1P0010 female for breast and pelvic exam.   1. Postmenopausal/atrophic genital changes. Status post TAH/BSO 1983 for pelvic pain and adhesions. Is doing well without significant hot flushes, night sweats or vaginal dryness. 2. Osteopenia. DEXA 2014 T score -1.1. I again reviewed her records were Dr. Eda Paschal had documented osteoporosis with Actonel and Fosamax use 2004 through 2011. Patient does not remember taking the medications but this has been clearly documented. I reviewed the issues of osteoporosis/osteopenia and whether she was diagnosed based on a fracture although she denies that history also. Will go ahead and check a baseline DEXA and  now patient will go ahead and schedule and we will go from there. 3. Mammography scheduled next week. SBE monthly reviewed. Patient again complains of bilateral mastalgia in the tail of Spence regions. Has done so for years. Mammograms have always been normal and exams negative. Her exam today again is negative. I reassured her with the bilaterality that it is probably physiologic and may be related to her bra. Assuming her mammogram is normal she'll continue with self breast exams and as long as no palpable abnormalities and her discomfort does not worsen or localizes to a specific area them will monitor. 4. Colonoscopy 2017. Repeat at their recommended interval. 5. Pap smear 2011. No Pap smear done today. History of dysplasia number of years ago before her hysterectomy. Normal Pap smears since. We reviewed current screening guidelines and she is comfortable to stop screening. 6. Health maintenance. No routine lab work done as this is done elsewhere. Follow up for bone density otherwise follow up in one year, sooner as needed.  Additional time in excess of her breast and pelvic exam was spent in direct face to face counseling and coordination of care in regards to her osteopenia/osteoporosis review of records, discussion of fracture risk and arrangement for DEXA as well as mastalgia evaluation and plan.     Dara Lords MD, 10:19 AM 11/23/2015

## 2015-11-23 NOTE — Patient Instructions (Signed)
Followup for bone density as scheduled. 

## 2015-11-30 DIAGNOSIS — Z1231 Encounter for screening mammogram for malignant neoplasm of breast: Secondary | ICD-10-CM | POA: Diagnosis not present

## 2015-12-01 ENCOUNTER — Encounter: Payer: Self-pay | Admitting: Gynecology

## 2015-12-04 DIAGNOSIS — G5761 Lesion of plantar nerve, right lower limb: Secondary | ICD-10-CM | POA: Diagnosis not present

## 2015-12-05 DIAGNOSIS — R922 Inconclusive mammogram: Secondary | ICD-10-CM | POA: Diagnosis not present

## 2015-12-05 DIAGNOSIS — R928 Other abnormal and inconclusive findings on diagnostic imaging of breast: Secondary | ICD-10-CM | POA: Diagnosis not present

## 2015-12-14 ENCOUNTER — Encounter: Payer: Self-pay | Admitting: Gynecology

## 2015-12-15 DIAGNOSIS — M8580 Other specified disorders of bone density and structure, unspecified site: Secondary | ICD-10-CM

## 2015-12-15 HISTORY — DX: Other specified disorders of bone density and structure, unspecified site: M85.80

## 2015-12-21 ENCOUNTER — Other Ambulatory Visit: Payer: Self-pay

## 2015-12-26 ENCOUNTER — Encounter: Payer: Self-pay | Admitting: Gynecology

## 2015-12-26 ENCOUNTER — Other Ambulatory Visit: Payer: Self-pay | Admitting: Gynecology

## 2015-12-26 ENCOUNTER — Ambulatory Visit (INDEPENDENT_AMBULATORY_CARE_PROVIDER_SITE_OTHER): Payer: Medicare Other

## 2015-12-26 DIAGNOSIS — M899 Disorder of bone, unspecified: Secondary | ICD-10-CM | POA: Diagnosis not present

## 2015-12-26 DIAGNOSIS — M8580 Other specified disorders of bone density and structure, unspecified site: Secondary | ICD-10-CM

## 2015-12-26 DIAGNOSIS — M8588 Other specified disorders of bone density and structure, other site: Secondary | ICD-10-CM | POA: Diagnosis not present

## 2016-01-01 ENCOUNTER — Telehealth: Payer: Self-pay | Admitting: Family Medicine

## 2016-01-01 MED ORDER — AMLODIPINE BESY-BENAZEPRIL HCL 5-20 MG PO CAPS: 1.0000 | ORAL_CAPSULE | Freq: Every day | ORAL | 0 refills | Status: DC

## 2016-01-01 NOTE — Telephone Encounter (Signed)
° ° °  Pt request refill of the following:  amLODipine-benazepril (LOTREL) 5-20 MG capsule  Pt req 30 day supply.. [facility]   Ph[facility]: [facility]

## 2016-01-01 NOTE — Telephone Encounter (Signed)
Medication sent into pharmacy for pt.

## 2016-01-02 ENCOUNTER — Telehealth: Payer: Self-pay | Admitting: Family Medicine

## 2016-01-02 NOTE — Telephone Encounter (Signed)
[facility]. Amlodipine is still on back order and pt needs another bp med. Pt needs 10 day supply of another bp med [facility] to [facility] [address] until [place] comes in

## 2016-01-02 NOTE — Telephone Encounter (Signed)
Please advise on medication alternates.

## 2016-02-01 ENCOUNTER — Ambulatory Visit: Payer: Medicare Other

## 2016-02-02 ENCOUNTER — Telehealth: Payer: Self-pay | Admitting: Family Medicine

## 2016-02-05 ENCOUNTER — Ambulatory Visit (INDEPENDENT_AMBULATORY_CARE_PROVIDER_SITE_OTHER): Payer: Medicare Other | Admitting: Family Medicine

## 2016-02-05 VITALS — BP 122/80 | HR 71 | Temp 98.1°F | Ht 67.0 in | Wt 213.0 lb

## 2016-02-05 DIAGNOSIS — J0140 Acute pansinusitis, unspecified: Secondary | ICD-10-CM | POA: Diagnosis not present

## 2016-02-05 MED ORDER — AMOXICILLIN-POT CLAVULANATE 875-125 MG PO TABS: 1.0000 | ORAL_TABLET | Freq: Two times a day (BID) | ORAL | 0 refills | Status: DC

## 2016-02-05 NOTE — Progress Notes (Signed)
Pre visit review using our clinic review tool, if applicable. No additional management support is needed unless otherwise documented below in the visit note. 

## 2016-02-05 NOTE — Progress Notes (Signed)
Subjective:     Patient ID: UVA BORSETH, female   DOB: 1940-03-13, 76 y.o.   MRN: [id number]  HPI Patient seen with almost 1 month history of cough, sore throat, almost daily headaches, and pansinusitis symptoms. She's had frontal and maxillary sinus pressure and pain. She had some yellowish nasal discharge. No fever. No chills. Positive fatigue.  Past Medical History:  Diagnosis Date  . Allergy, unspecified not elsewhere classified   . Anxiety   . Arthritis   . Cervical dysplasia    prior to hysterectomy  . GERD (gastroesophageal reflux disease)   . Headache(784.0)   . Hypertension   . IBS (irritable bowel syndrome)   . Osteopenia 12/2015   T score -1.1 FRAX 3.7%/0.4%  . Pelvic adhesions   . Pelvic pain in female   . Sinus infection   . Thyroid disease    Parathyroid cyst   Past Surgical History:  Procedure Laterality Date  . ABDOMINAL HYSTERECTOMY  1983   TAH/BSO for pelvic pain and adhesions  . APPENDECTOMY  1994  . CATARACT EXTRACTION Bilateral 2013  . COLPOSCOPY    . FOOT SURGERY Bilateral   . PARATHYROID ADENOMA REMOVED  02/1980  . ROTATOR CUFF REPAIR     X2  . SHOULDER INJECTION  10/2011  . TONSILLECTOMY    . TUBAL LIGATION  1981  . VESICOVAGINAL FISTULA CLOSURE W/ TAH      reports that she has never smoked. She has never used smokeless tobacco. She reports that she drinks about 0.6 oz of alcohol per week . She reports that she does not use drugs. family history includes Diabetes in her maternal aunt, maternal grandmother, and mother; Heart disease in her maternal grandmother; Heart failure in her paternal grandmother; Hypertension in her maternal grandmother; Leukemia in her brother; Lung cancer in her father; Stroke in her maternal grandmother. Allergies  Allergen Reactions  . Prednisone     REACTION: heart palpatations  In larger doses  . Seldane [Terfenadine]     SELDANE  . Sulfonamide Derivatives     REACTION: pt unable to remember  . Vioxx  [Rofecoxib]     REACTION: mouth swelling (VIOXX)     Review of Systems  Constitutional: Positive for fatigue. Negative for chills and fever.  HENT: Positive for congestion and sinus pressure.   Respiratory: Positive for cough.   Neurological: Positive for headaches.       Objective:   Physical Exam  Constitutional: She appears well-developed and well-nourished.  HENT:  Right Ear: External ear normal.  Left Ear: External ear normal.  Mouth/Throat: Oropharynx is clear and moist.  Neck: Neck supple.  Cardiovascular: Normal rate and regular rhythm.   Pulmonary/Chest: Effort normal and breath sounds normal. No respiratory distress. She has no wheezes. She has no rales.  Lymphadenopathy:    She has no cervical adenopathy.       Assessment:     Pansinusitis    Plan:     -Given duration of symptoms, start Augmentin 875 mg twice daily for 10 days -Follow-up if symptoms not improving following the above  Eulas Post MD [facility] at [facility]

## 2016-02-05 NOTE — Patient Instructions (Signed)

## 2016-02-07 ENCOUNTER — Ambulatory Visit (INDEPENDENT_AMBULATORY_CARE_PROVIDER_SITE_OTHER): Payer: Medicare Other

## 2016-02-07 VITALS — BP 132/80 | HR 69 | Ht 67.0 in | Wt 213.0 lb

## 2016-02-07 DIAGNOSIS — Z0000 Encounter for general adult medical examination without abnormal findings: Secondary | ICD-10-CM

## 2016-02-07 NOTE — Patient Instructions (Addendum)
Margaret Mitchell , Thank you for taking time to come for your Medicare Wellness Visit. I appreciate your ongoing commitment to your health goals. Please review the following plan we discussed and let me know if I can assist you in the future.   BP is good   These are the goals we discussed: Goals    . Exercise 150 minutes per week (moderate activity)          Plan to walk on treadmill 20 minutes and one mile per day x 6 days        This is a list of the screening recommended for you and due dates:  Health Maintenance  Topic Date Due  . Mammogram  12/04/2017  . Tetanus Vaccine  10/14/2022  . Colon Cancer Screening  02/01/2025  . Flu Shot  Completed  . DEXA scan (bone density measurement)  Completed  . Shingles Vaccine  Completed  . Pneumonia vaccines  Completed    DASH Eating Plan DASH stands for "Dietary Approaches to Stop Hypertension." The DASH eating plan is a healthy eating plan that has been shown to reduce high blood pressure (hypertension). Additional health benefits may include reducing the risk of type 2 diabetes mellitus, heart disease, and stroke. The DASH eating plan may also help with weight loss. What do I need to know about the DASH eating plan? For the DASH eating plan, you will follow these general guidelines:  Choose foods with less than 150 milligrams of sodium per serving (as listed on the food label).  Use salt-free seasonings or herbs instead of table salt or sea salt.  Check with your health care provider or pharmacist before using salt substitutes.  Eat lower-sodium products. These are often labeled as "low-sodium" or "no salt added."  Eat fresh foods. Avoid eating a lot of canned foods.  Eat more vegetables, fruits, and low-fat dairy products.  Choose whole grains. Look for the word "whole" as the first word in the ingredient list.  Choose fish and skinless chicken or [place] more often than red meat. Limit fish, poultry, and meat to 6 oz (170 g) each  day.  Limit sweets, desserts, sugars, and sugary drinks.  Choose heart-healthy fats.  Eat more home-cooked food and less restaurant, buffet, and fast food.  Limit fried foods.  Do not fry foods. Cook foods using methods such as baking, boiling, grilling, and broiling instead.  When eating at a restaurant, ask that your food be prepared with less salt, or no salt if possible. What foods can I eat? Seek help from a dietitian for individual calorie needs. Grains  Whole grain or whole wheat bread. Brown rice. Whole grain or whole wheat pasta. Quinoa, bulgur, and whole grain cereals. Low-sodium cereals. Corn or whole wheat flour tortillas. Whole grain cornbread. Whole grain crackers. Low-sodium crackers. Vegetables  Fresh or frozen vegetables (raw, steamed, roasted, or grilled). Low-sodium or reduced-sodium tomato and vegetable juices. Low-sodium or reduced-sodium tomato sauce and paste. Low-sodium or reduced-sodium canned vegetables. Fruits  All fresh, canned (in natural juice), or frozen fruits. Meat and Other Protein Products  Ground beef (85% or leaner), grass-fed beef, or beef trimmed of fat. Skinless chicken or [place]. Ground chicken or [place]. Pork trimmed of fat. All fish and seafood. Eggs. Dried beans, peas, or lentils. Unsalted nuts and seeds. Unsalted canned beans. Dairy  Low-fat dairy products, such as skim or 1% milk, 2% or reduced-fat cheeses, low-fat ricotta or cottage cheese, or plain low-fat yogurt. Low-sodium or reduced-sodium cheeses.  Fats and Oils  Tub margarines without trans fats. Light or reduced-fat mayonnaise and salad dressings (reduced sodium). Avocado. Safflower, olive, or canola oils. Natural peanut or almond butter. Other  Unsalted popcorn and pretzels. The items listed above may not be a complete list of recommended foods or beverages. Contact your dietitian for more options.  What foods are not recommended? Grains  White bread. White pasta. White rice.  Refined cornbread. Bagels and croissants. Crackers that contain trans fat. Vegetables  Creamed or fried vegetables. Vegetables in a cheese sauce. Regular canned vegetables. Regular canned tomato sauce and paste. Regular tomato and vegetable juices. Fruits  Canned fruit in light or heavy syrup. Fruit juice. Meat and Other Protein Products  Fatty cuts of meat. Ribs, chicken wings, bacon, sausage, bologna, salami, chitterlings, fatback, hot dogs, bratwurst, and packaged luncheon meats. Salted nuts and seeds. Canned beans with salt. Dairy  Whole or 2% milk, cream, half-and-half, and cream cheese. Whole-fat or sweetened yogurt. Full-fat cheeses or blue cheese. Nondairy creamers and whipped toppings. Processed cheese, cheese spreads, or cheese curds. Condiments  Onion and garlic salt, seasoned salt, table salt, and sea salt. Canned and packaged gravies. Worcestershire sauce. Tartar sauce. Barbecue sauce. Teriyaki sauce. Soy sauce, including reduced sodium. Steak sauce. Fish sauce. Oyster sauce. Cocktail sauce. Horseradish. Ketchup and mustard. Meat flavorings and tenderizers. Bouillon cubes. Hot sauce. Tabasco sauce. Marinades. Taco seasonings. Relishes. Fats and Oils  Butter, stick margarine, lard, shortening, ghee, and bacon fat. Coconut, palm kernel, or palm oils. Regular salad dressings. Other  Pickles and olives. Salted popcorn and pretzels. The items listed above may not be a complete list of foods and beverages to avoid. Contact your dietitian for more information.  Where can I find more information? National Heart, Lung, and Blood Institute: travelstabloid.com This information is not intended to replace advice given to you by your health care provider. Make sure you discuss any questions you have with your health care provider. Document Released: 12/20/2010 Document Revised: 06/08/2015 Document Reviewed: 11/04/2012 Elsevier Interactive Patient Education  2017  [place] for Type 2 Diabetes A screening test for type 2 diabetes (type 2 diabetes mellitus) is a blood test to measure your blood sugar (glucose) level. This test is done to check for early signs of diabetes, before you develop symptoms. Type 2 diabetes is a long-term (chronic) disease that occurs when the pancreas does not make enough of a hormone called insulin. This results in high blood glucose levels, which can cause many complications. You may be screened for type 2 diabetes as part of your regular health care, especially if you have a high risk for diabetes. Screening can help identify type 2 diabetes at its early stage (prediabetes). Identifying and treating prediabetes may delay or prevent development of type 2 diabetes. What are the risk factors for type 2 diabetes? The following factors may make you more likely to develop type 2 diabetes:  Having a parent or sibling (first-degree relative) who has diabetes.  Being overweight or obese.  Being of American-Indian, [place], Hispanic, Latino, Asian, or African-American descent.  Not getting enough exercise.  Being older than 38.  Having a history of diabetes during pregnancy (gestational diabetes).  Having low levels of good cholesterol (HDL-C) or high levels of blood fats (triglycerides).  Having high blood glucose in a previous blood test.  Having high blood pressure.  Having certain diseases or conditions, including:  Acanthosis nigricans. This is a condition that causes dark skin on the  neck, armpits, and groin.  Polycystic ovary syndrome (PCOS).  Heart disease.  Having delivered a baby who weighed more than 9 lb (4.1 kg). Who should be screened for type 2 diabetes? Adults  Adults age 38 and older. These adults should be screened at least once every three years.  Adults who are younger than 44, overweight, and have at least one other risk factor. These adults should be screened at least  once every three years.  Adults who have normal blood glucose levels and two or more risk factors. These adults may be screened once every year (annually).  Women who have had gestational diabetes in the past. These women should be screened at least once every three years.  Pregnant women who have risk factors. These women should be screened at their first prenatal visit.  Pregnant women with no risk factors. These women should be screened between weeks 24 and 28 of pregnancy. Children and adolescents  Children and adolescents should be screened for type 2 diabetes if they are overweight and have 2 of the following risk factors:  A family history of type 2 diabetes.  Being a member of a high risk race or ethnic group.  Signs of insulin resistance or conditions associated with insulin resistance.  A mother who had gestational diabetes while pregnant with him or her.  Screening should be done at least once every three years, starting at age 33. Your health care provider or your child's health care provider may recommend having a screening more or less often. What happens during screening? During screening, your health care provider may ask questions about:  Your health and your risk factors, including your activity level and any medical conditions that you have.  The health of your first-degree relatives.  Past pregnancies, if this applies. Your health care provider will also do a physical exam, including a blood pressure measurement and blood tests. There are four blood tests that can be used to screen for type 2 diabetes. You may have one or more of the following:  A fasting plasma glucose test (FBG). You will not be allowed to eat for at least eight hours before a blood sample is taken.  A random blood glucose test. This test checks your blood glucose at any time of the day regardless of when you ate.  An oral glucose tolerance test (OGTT). This test measures your blood glucose  at two times:  After you have not eaten (have fasted) overnight.  Two hours after you drink a glucose-containing beverage. A diagnosis can be made if the level is greater than 200 mg/dL (11.1 mmol/L).  An A1c test. This test provides information about blood glucose control over the previous three months. What do the results mean? Your test results are a measurement of how much glucose is in your blood. Normal blood glucose levels mean that you do not have diabetes or prediabetes. High blood glucose levels may mean that you have prediabetes or diabetes. Depending on the results, other tests may be needed to confirm the diagnosis. This information is not intended to replace advice given to you by your health care provider. Make sure you discuss any questions you have with your health care provider. Document Released: 10/27/2008 Document Revised: 06/08/2015 Document Reviewed: 10/28/2014 Elsevier Interactive Patient Education  2017 [place] Prevention in the Home Introduction Falls can cause injuries. They can happen to people of all ages. There are many things you can do to make your home safe  and to help prevent falls. What can I do on the outside of my home?  Regularly fix the edges of walkways and driveways and fix any cracks.  Remove anything that might make you trip as you walk through a door, such as a raised step or threshold.  Trim any bushes or trees on the path to your home.  Use bright outdoor lighting.  Clear any walking paths of anything that might make someone trip, such as rocks or tools.  Regularly check to see if handrails are loose or broken. Make sure that both sides of any steps have handrails.  Any raised decks and porches should have guardrails on the edges.  Have any leaves, snow, or ice cleared regularly.  Use sand or salt on walking paths during winter.  Clean up any spills in your garage right away. This includes oil or grease spills. What can  I do in the bathroom?  Use night lights.  Install grab bars by the toilet and in the tub and shower. Do not use towel bars as grab bars.  Use non-skid mats or decals in the tub or shower.  If you need to sit down in the shower, use a plastic, non-slip stool.  Keep the floor dry. Clean up any water that spills on the floor as soon as it happens.  Remove soap buildup in the tub or shower regularly.  Attach bath mats securely with double-sided non-slip rug tape.  Do not have throw rugs and other things on the floor that can make you trip. What can I do in the bedroom?  Use night lights.  Make sure that you have a light by your bed that is easy to reach.  Do not use any sheets or blankets that are too big for your bed. They should not hang down onto the floor.  Have a firm chair that has side arms. You can use this for support while you get dressed.  Do not have throw rugs and other things on the floor that can make you trip. What can I do in the kitchen?  Clean up any spills right away.  Avoid walking on wet floors.  Keep items that you use a lot in easy-to-reach places.  If you need to reach something above you, use a strong step stool that has a grab bar.  Keep electrical cords out of the way.  Do not use floor polish or wax that makes floors slippery. If you must use wax, use non-skid floor wax.  Do not have throw rugs and other things on the floor that can make you trip. What can I do with my stairs?  Do not leave any items on the stairs.  Make sure that there are handrails on both sides of the stairs and use them. Fix handrails that are broken or loose. Make sure that handrails are as long as the stairways.  Check any carpeting to make sure that it is firmly attached to the stairs. Fix any carpet that is loose or worn.  Avoid having throw rugs at the top or bottom of the stairs. If you do have throw rugs, attach them to the floor with carpet tape.  Make sure  that you have a light switch at the top of the stairs and the bottom of the stairs. If you do not have them, ask someone to add them for you. What else can I do to help prevent falls?  Wear shoes that:  Do not have high  heels.  Have rubber bottoms.  Are comfortable and fit you well.  Are closed at the toe. Do not wear sandals.  If you use a stepladder:  Make sure that it is fully opened. Do not climb a closed stepladder.  Make sure that both sides of the stepladder are locked into place.  Ask someone to hold it for you, if possible.  Clearly mark and make sure that you can see:  Any grab bars or handrails.  First and last steps.  Where the edge of each step is.  Use tools that help you move around (mobility aids) if they are needed. These include:  Canes.  Walkers.  Scooters.  Crutches.  Turn on the lights when you go into a dark area. Replace any light bulbs as soon as they burn out.  Set up your furniture so you have a clear path. Avoid moving your furniture around.  If any of your floors are uneven, fix them.  If there are any pets around you, be aware of where they are.  Review your medicines with your doctor. Some medicines can make you feel dizzy. This can increase your chance of falling. Ask your doctor what other things that you can do to help prevent falls. This information is not intended to replace advice given to you by your health care provider. Make sure you discuss any questions you have with your health care provider. Document Released: 10/27/2008 Document Revised: 06/08/2015 Document Reviewed: 02/04/2014  2017 Elsevier  Health Maintenance, Female Introduction Adopting a healthy lifestyle and getting preventive care can go a long way to promote health and wellness. Talk with your health care provider about what schedule of regular examinations is right for you. This is a good chance for you to check in with your provider about disease prevention  and staying healthy. In between checkups, there are plenty of things you can do on your own. Experts have done a lot of research about which lifestyle changes and preventive measures are most likely to keep you healthy. Ask your health care provider for more information. Weight and diet Eat a healthy diet  Be sure to include plenty of vegetables, fruits, low-fat dairy products, and lean protein.  Do not eat a lot of foods high in solid fats, added sugars, or salt.  Get regular exercise. This is one of the most important things you can do for your health.  Most adults should exercise for at least 150 minutes each week. The exercise should increase your heart rate and make you sweat (moderate-intensity exercise).  Most adults should also do strengthening exercises at least twice a week. This is in addition to the moderate-intensity exercise. Maintain a healthy weight  Body mass index (BMI) is a measurement that can be used to identify possible weight problems. It estimates body fat based on height and weight. Your health care provider can help determine your BMI and help you achieve or maintain a healthy weight.  For females 41 years of age and older:  A BMI below 18.5 is considered underweight.  A BMI of 18.5 to 24.9 is normal.  A BMI of 25 to 29.9 is considered overweight.  A BMI of 30 and above is considered obese. Watch levels of cholesterol and blood lipids  You should start having your blood tested for lipids and cholesterol at 74 years of age, then have this test every 5 years.  You may need to have your cholesterol levels checked more often if:  Your  lipid or cholesterol levels are high.  You are older than 74 years of age.  You are at high risk for heart disease. Cancer screening Lung Cancer  Lung cancer screening is recommended for adults 34-72 years old who are at high risk for lung cancer because of a history of smoking.  A yearly low-dose CT scan of the lungs is  recommended for people who:  Currently smoke.  Have quit within the past 15 years.  Have at least a 30-pack-year history of smoking. A pack year is smoking an average of one pack of cigarettes a day for 1 year.  Yearly screening should continue until it has been 15 years since you quit.  Yearly screening should stop if you develop a health problem that would prevent you from having lung cancer treatment. Breast Cancer  Practice breast self-awareness. This means understanding how your breasts normally appear and feel.  It also means doing regular breast self-exams. Let your health care provider know about any changes, no matter how small.  If you are in your 20s or 30s, you should have a clinical breast exam (CBE) by a health care provider every 1-3 years as part of a regular health exam.  If you are 54 or older, have a CBE every year. Also consider having a breast X-ray (mammogram) every year.  If you have a family history of breast cancer, talk to your health care provider about genetic screening.  If you are at high risk for breast cancer, talk to your health care provider about having an MRI and a mammogram every year.  Breast cancer gene (BRCA) assessment is recommended for women who have family members with BRCA-related cancers. BRCA-related cancers include:  Breast.  Ovarian.  Tubal.  Peritoneal cancers.  Results of the assessment will determine the need for genetic counseling and BRCA1 and BRCA2 testing. Cervical Cancer  Your health care provider may recommend that you be screened regularly for cancer of the pelvic organs (ovaries, uterus, and vagina). This screening involves a pelvic examination, including checking for microscopic changes to the surface of your cervix (Pap test). You may be encouraged to have this screening done every 3 years, beginning at age 53.  For women ages 74-65, health care providers may recommend pelvic exams and Pap testing every 3 years, or  they may recommend the Pap and pelvic exam, combined with testing for human papilloma virus (HPV), every 5 years. Some types of HPV increase your risk of cervical cancer. Testing for HPV may also be done on women of any age with unclear Pap test results.  Other health care providers may not recommend any screening for nonpregnant women who are considered low risk for pelvic cancer and who do not have symptoms. Ask your health care provider if a screening pelvic exam is right for you.  If you have had past treatment for cervical cancer or a condition that could lead to cancer, you need Pap tests and screening for cancer for at least 20 years after your treatment. If Pap tests have been discontinued, your risk factors (such as having a new sexual partner) need to be reassessed to determine if screening should resume. Some women have medical problems that increase the chance of getting cervical cancer. In these cases, your health care provider may recommend more frequent screening and Pap tests. Colorectal Cancer  This type of cancer can be detected and often prevented.  Routine colorectal cancer screening usually begins at 74 years of age and continues  through 74 years of age.  Your health care provider may recommend screening at an earlier age if you have risk factors for colon cancer.  Your health care provider may also recommend using home test kits to check for hidden blood in the stool.  A small camera at the end of a tube can be used to examine your colon directly (sigmoidoscopy or colonoscopy). This is done to check for the earliest forms of colorectal cancer.  Routine screening usually begins at age 76.  Direct examination of the colon should be repeated every 5-10 years through 74 years of age. However, you may need to be screened more often if early forms of precancerous polyps or small growths are found. Skin Cancer  Check your skin from head to toe regularly.  Tell your health care  provider about any new moles or changes in moles, especially if there is a change in a mole's shape or color.  Also tell your health care provider if you have a mole that is larger than the size of a pencil eraser.  Always use sunscreen. Apply sunscreen liberally and repeatedly throughout the day.  Protect yourself by wearing long sleeves, pants, a wide-brimmed hat, and sunglasses whenever you are outside. Heart disease, diabetes, and high blood pressure  High blood pressure causes heart disease and increases the risk of stroke. High blood pressure is more likely to develop in:  People who have blood pressure in the high end of the normal range (130-139/85-89 mm Hg).  People who are overweight or obese.  People who are African American.  If you are 67-68 years of age, have your blood pressure checked every 3-5 years. If you are 75 years of age or older, have your blood pressure checked every year. You should have your blood pressure measured twice-once when you are at a hospital or clinic, and once when you are not at a hospital or clinic. Record the average of the two measurements. To check your blood pressure when you are not at a hospital or clinic, you can use:  An automated blood pressure machine at a pharmacy.  A home blood pressure monitor.  If you are between 41 years and 33 years old, ask your health care provider if you should take aspirin to prevent strokes.  Have regular diabetes screenings. This involves taking a blood sample to check your fasting blood sugar level.  If you are at a normal weight and have a low risk for diabetes, have this test once every three years after 74 years of age.  If you are overweight and have a high risk for diabetes, consider being tested at a younger age or more often. Preventing infection Hepatitis B  If you have a higher risk for hepatitis B, you should be screened for this virus. You are considered at high risk for hepatitis B if:  You  were born in a country where hepatitis B is common. Ask your health care provider which countries are considered high risk.  Your parents were born in a high-risk country, and you have not been immunized against hepatitis B (hepatitis B vaccine).  You have HIV or AIDS.  You use needles to inject street drugs.  You live with someone who has hepatitis B.  You have had sex with someone who has hepatitis B.  You get hemodialysis treatment.  You take certain medicines for conditions, including cancer, organ transplantation, and autoimmune conditions. Hepatitis C  Blood testing is recommended for:  Everyone born  from [place] through 1965.  Anyone with known risk factors for hepatitis C. Sexually transmitted infections (STIs)  You should be screened for sexually transmitted infections (STIs) including gonorrhea and chlamydia if:  You are sexually active and are younger than 74 years of age.  You are older than 74 years of age and your health care provider tells you that you are at risk for this type of infection.  Your sexual activity has changed since you were last screened and you are at an increased risk for chlamydia or gonorrhea. Ask your health care provider if you are at risk.  If you do not have HIV, but are at risk, it may be recommended that you take a prescription medicine daily to prevent HIV infection. This is called pre-exposure prophylaxis (PrEP). You are considered at risk if:  You are sexually active and do not regularly use condoms or know the HIV status of your partner(s).  You take drugs by injection.  You are sexually active with a partner who has HIV. Talk with your health care provider about whether you are at high risk of being infected with HIV. If you choose to begin PrEP, you should first be tested for HIV. You should then be tested every 3 months for as long as you are taking PrEP. Pregnancy  If you are premenopausal and you may become pregnant, ask your  health care provider about preconception counseling.  If you may become pregnant, take 400 to 800 micrograms (mcg) of folic acid every day.  If you want to prevent pregnancy, talk to your health care provider about birth control (contraception). Osteoporosis and menopause  Osteoporosis is a disease in which the bones lose minerals and strength with aging. This can result in serious bone fractures. Your risk for osteoporosis can be identified using a bone density scan.  If you are 56 years of age or older, or if you are at risk for osteoporosis and fractures, ask your health care provider if you should be screened.  Ask your health care provider whether you should take a calcium or vitamin D supplement to lower your risk for osteoporosis.  Menopause may have certain physical symptoms and risks.  Hormone replacement therapy may reduce some of these symptoms and risks. Talk to your health care provider about whether hormone replacement therapy is right for you. Follow these instructions at home:  Schedule regular health, dental, and eye exams.  Stay current with your immunizations.  Do not use any tobacco products including cigarettes, chewing tobacco, or electronic cigarettes.  If you are pregnant, do not drink alcohol.  If you are breastfeeding, limit how much and how often you drink alcohol.  Limit alcohol intake to no more than 1 drink per day for nonpregnant women. One drink equals 12 ounces of beer, 5 ounces of wine, or 1 ounces of hard liquor.  Do not use street drugs.  Do not share needles.  Ask your health care provider for help if you need support or information about quitting drugs.  Tell your health care provider if you often feel depressed.  Tell your health care provider if you have ever been abused or do not feel safe at home. This information is not intended to replace advice given to you by your health care provider. Make sure you discuss any questions you have  with your health care provider. Document Released: 07/16/2010 Document Revised: 06/08/2015 Document Reviewed: 10/04/2014  2017 Elsevier

## 2016-02-07 NOTE — Progress Notes (Addendum)
Subjective:   Margaret Mitchell is a 76 y.o. female who presents for Medicare Annual (Subsequent) preventive examination.  The Patient was informed that the wellness visit is to identify future health risk and educate and initiate measures that can reduce risk for increased disease through the lifespan.    NO ROS; Medicare Wellness Visit (family hx mother had DM; father had lung cancer; Brother had leukemia; HTN; HD; DM: stroke  Describes health as good, fair or great? Good Spouse x 11 years 72 yo Sunday  Patient-completed health risk assessment  - completed and reviewed, see scanned documentation  The following written screening schedule of preventive measures were reviewed with assessment and plan made per below and patient instructions:  Smoking history reviewed - never smoked   Use of Smokeless tobacco  no Second Hand Smoke status; No Smokers in the home Dental fup / bridges now; just had 5000 worth of work done   ETOH Rare   RISK FACTORS Regular exercise-  A mile a day on treadmill 6 days a week;   Diet;  incorporates fruits and vegetables; yes Lots of vegetables;  Cutting back on meat; does not fry  Obesity and weight loss plan discussed  Has tried to lose; Tri[employer] diet and lost 30 lbs  Lipids reviewed and reducing cholesterol discussed  Chol 180; hdl 66; LDL 97; Trig 125  Glucose 6.1 but FBS WNL  Prediabetes to discuss and given information on numbers  Normal FBS with 6.1 a1c   Fall risk: presented mobile; gets in a hurry which puts her at risk [place] on stair case one year and is much more careful now  Mobility of Functional changes this year? No  Can climb stairs; bedrooms are upstairs Spouse has MI in August; considering a stair lift  Safety at home and  Community reviewed; good neighbors  wears sunscreen if in the sun; yes; does see a dermatologist  Keeps firearms in a safe place if they exist - hates guns Safe driving recommendations for older adults;    Motor vehicle accidents assessed in the last year no  Depression Screen PhQ 2: negative  Activities of Daily Living - See functional screen   Cognitive testing; Ad8 score; 0 or less than 2  MMSE deferred or completed if AD8 + 2 issues  Advanced Directives reviewed for completion; discussion with MD as well as supportive resources as needed/ with will and not able to get it but family informed    Preventives screens reviewed Colonoscopy  01/2015 repeat 01/2025 Mammogram 11/2015 Dexa 12/2015 osteopenia / Dr Lenna Gilford started her on D3 2000 units  GYN; last visit was last year     Immunization History  Administered Date(s) Administered  . Influenza Split 11/13/2010, 11/12/2011  . Influenza Whole 10/27/2007, 11/08/2008, 12/08/2009  . Influenza, High Dose Seasonal PF 10/09/2013, 10/28/2014, 09/28/2015  . Influenza,inj,Quad PF,36+ Mos 09/30/2012  . Pneumococcal Conjugate-13 05/25/2013  . Pneumococcal Polysaccharide-23 05/08/2009  . Tdap 10/13/2012  . Zoster 01/15/2011   Required Immunizations needed today  Screening test up to date or reviewed for plan of completion There are no preventive care reminders to display for this patient.   ' Cardiac Risk Factors include: advanced age (>54men, >52 women);dyslipidemia;hypertension     Objective:     Vitals: BP 132/80   Pulse 69   Ht 5\' 7"  (1.702 m)   Wt 213 lb (96.6 kg)   SpO2 98%   BMI 33.36 kg/m   Body mass index is 33.36 kg/m.  Tobacco History  Smoking Status  . Never Smoker  Smokeless Tobacco  . Never Used     Counseling given: Not Answered no smoking hx and was counseled   Past Medical History:  Diagnosis Date  . Allergy, unspecified not elsewhere classified   . Anxiety   . Arthritis   . Cervical dysplasia    prior to hysterectomy  . GERD (gastroesophageal reflux disease)   . Headache(784.0)   . Hypertension   . IBS (irritable bowel syndrome)   . Osteopenia 12/2015   T score -1.1 FRAX 3.7%/0.4%   . Pelvic adhesions   . Pelvic pain in female   . Sinus infection   . Thyroid disease    Parathyroid cyst   Past Surgical History:  Procedure Laterality Date  . ABDOMINAL HYSTERECTOMY  1983   TAH/BSO for pelvic pain and adhesions  . APPENDECTOMY  1994  . CATARACT EXTRACTION Bilateral 2013  . COLPOSCOPY    . FOOT SURGERY Bilateral   . PARATHYROID ADENOMA REMOVED  02/1980  . ROTATOR CUFF REPAIR     X2  . SHOULDER INJECTION  10/2011  . TONSILLECTOMY    . TUBAL LIGATION  1981  . VESICOVAGINAL FISTULA CLOSURE W/ TAH     Family History  Problem Relation Age of Onset  . Diabetes Mother   . Lung cancer Father   . Leukemia Brother   . Hypertension Maternal Grandmother   . Heart disease Maternal Grandmother   . Diabetes Maternal Grandmother   . Stroke Maternal Grandmother   . Diabetes Maternal Aunt   . Heart failure Paternal Grandmother    History  Sexual Activity  . Sexual activity: No    Comment: 1st intercourse 76 yo-Fewer than 5 partners    Outpatient Encounter Prescriptions as of 02/07/2016  Medication Sig  . amLODipine-benazepril (LOTREL) 5-20 MG capsule Take 1 capsule by mouth daily.  Marland Kitchen amoxicillin-clavulanate (AUGMENTIN) 875-125 MG tablet Take 1 tablet by mouth 2 (two) times daily.  Marland Kitchen aspirin 81 MG tablet Take 81 mg by mouth daily.    . cholecalciferol (VITAMIN D) 1000 UNITS tablet Take 1,000 Units by mouth daily.  Marland Kitchen CINNAMON PO Take 2,000 mg by mouth daily.  Marland Kitchen LINZESS 145 MCG CAPS capsule   . Multiple Vitamin (MULTIVITAMIN) tablet Take 1 tablet by mouth daily.  . Omega-3 Fatty Acids (FISH OIL) 1200 MG CPDR Take 2 tablets by mouth daily.  . TOPROL XL 100 MG 24 hr tablet TAKE 1 TABLET DAILY WITH OR IMMEDIATELY FOLLOWING A MEAL   No facility-administered encounter medications on file as of 02/07/2016.     Activities of Daily Living In your present state of health, do you have any difficulty performing the following activities: 02/07/2016  Hearing? N  Vision? N   Difficulty concentrating or making decisions? N  Walking or climbing stairs? N  Dressing or bathing? N  Doing errands, shopping? N  Preparing Food and eating ? N  Using the Toilet? N  In the past six months, have you accidently leaked urine? N  Do you have problems with loss of bowel control? N  Managing your Medications? N  Managing your Finances? N  Housekeeping or managing your Housekeeping? N  Some recent data might be hidden    Patient Care Team: Eulas Post, MD as PCP - General (Family Medicine)    Assessment:     Exercise Activities and Dietary recommendations Current Exercise Habits: Home exercise routine, Type of exercise: walking, Time (Minutes): 20, Frequency (Times/Week):  6, Weekly Exercise (Minutes/Week): 120  Goals    . Exercise 150 minutes per week (moderate activity)          Plan to walk on treadmill 20 minutes and one mile per day x 6 days       Fall Risk Fall Risk  02/07/2016 12/21/2015 12/01/2014 06/15/2013 05/13/2012  Falls in the past year? No No No No No   Depression Screen PHQ 2/9 Scores 02/07/2016 12/01/2014 06/15/2013 05/13/2012  PHQ - 2 Score 0 0 0 1     Cognitive Function good historian;  No issue        Immunization History  Administered Date(s) Administered  . Influenza Split 11/13/2010, 11/12/2011  . Influenza Whole 10/27/2007, 11/08/2008, 12/08/2009  . Influenza, High Dose Seasonal PF 10/09/2013, 10/28/2014, 09/28/2015  . Influenza,inj,Quad PF,36+ Mos 09/30/2012  . Pneumococcal Conjugate-13 05/25/2013  . Pneumococcal Polysaccharide-23 05/08/2009  . Tdap 10/13/2012  . Zoster 01/15/2011   Screening Tests Health Maintenance  Topic Date Due  . MAMMOGRAM  12/04/2017  . TETANUS/TDAP  10/14/2022  . COLONOSCOPY  02/01/2025  . INFLUENZA VACCINE  Completed  . DEXA SCAN  Completed  . ZOSTAVAX  Completed  . PNA vac Low Risk Adult  Completed      Plan:   Agrees to start exercising on a regular basis Discussed pre -diabetes  although fbs are wnl  Up to date on all preventive health   During the course of the visit the patient was educated and counseled about the following appropriate screening and preventive services:   Vaccines to include Pneumoccal, Influenza, Hepatitis B, Td, Zostavax, HCV  Electrocardiogram  Cardiovascular Disease  Colorectal cancer screening  Bone density screening  Diabetes screening  Glaucoma screening  Mammography/PAP  Nutrition counseling / discussed and is trying to cut back.   Patient Instructions (the written plan) was given to the patient.   [id number], RN  02/07/2016  Agree with assessment and recommendations as above.    Eulas Post MD [facility] at [facility]

## 2016-02-27 ENCOUNTER — Telehealth (HOSPITAL_COMMUNITY): Payer: Self-pay | Admitting: *Deleted

## 2016-02-27 NOTE — Telephone Encounter (Signed)
Patient given detailed instructions per Myocardial Perfusion Study Information Sheet for the test on 03/01/16. Patient notified to arrive 15 minutes early and that it is imperative to arrive on time for appointment to keep from having the test rescheduled.  If you need to cancel or reschedule your appointment, please call the office within 24 hours of your appointment. Failure to do so may result in a cancellation of your appointment, and a $50 no show fee. Patient verbalized understanding. Kirstie Peri

## 2016-03-01 ENCOUNTER — Ambulatory Visit (HOSPITAL_COMMUNITY): Payer: Medicare Other | Attending: Cardiology

## 2016-03-01 DIAGNOSIS — I10 Essential (primary) hypertension: Secondary | ICD-10-CM | POA: Diagnosis not present

## 2016-03-01 DIAGNOSIS — Z8249 Family history of ischemic heart disease and other diseases of the circulatory system: Secondary | ICD-10-CM | POA: Insufficient documentation

## 2016-03-01 DIAGNOSIS — R079 Chest pain, unspecified: Secondary | ICD-10-CM | POA: Insufficient documentation

## 2016-03-01 DIAGNOSIS — I2510 Atherosclerotic heart disease of native coronary artery without angina pectoris: Secondary | ICD-10-CM | POA: Diagnosis present

## 2016-03-01 LAB — MYOCARDIAL PERFUSION IMAGING
CHL CUP NUCLEAR SRS: 8
CHL CUP NUCLEAR SSS: 10
CSEPPHR: 81 {beats}/min
LV dias vol: 93 mL (ref 46–106)
LV sys vol: 37 mL
NUC STRESS TID: 1.04
RATE: 0.26
Rest HR: 61 {beats}/min
SDS: 2

## 2016-03-01 MED ORDER — REGADENOSON 0.4 MG/5ML IV SOLN
0.4000 mg | Freq: Once | INTRAVENOUS | Status: AC
  Administered 2016-03-01: 0.4 mg via INTRAVENOUS

## 2016-03-01 MED ORDER — TECHNETIUM TC 99M TETROFOSMIN IV KIT
33.0000 | PACK | Freq: Once | INTRAVENOUS | Status: AC | PRN
  Administered 2016-03-01: 33 via INTRAVENOUS
  Filled 2016-03-01: qty 33

## 2016-03-01 MED ORDER — TECHNETIUM TC 99M TETROFOSMIN IV KIT
10.6000 | PACK | Freq: Once | INTRAVENOUS | Status: AC | PRN
  Administered 2016-03-01: 10.6 via INTRAVENOUS
  Filled 2016-03-01: qty 11

## 2016-03-04 DIAGNOSIS — L600 Ingrowing nail: Secondary | ICD-10-CM | POA: Diagnosis not present

## 2016-05-01 ENCOUNTER — Ambulatory Visit (INDEPENDENT_AMBULATORY_CARE_PROVIDER_SITE_OTHER): Payer: Medicare Other | Admitting: Family Medicine

## 2016-05-01 ENCOUNTER — Encounter: Payer: Self-pay | Admitting: Family Medicine

## 2016-05-01 VITALS — BP 130/80 | HR 66 | Temp 98.2°F | Wt 210.3 lb

## 2016-05-01 DIAGNOSIS — K219 Gastro-esophageal reflux disease without esophagitis: Secondary | ICD-10-CM

## 2016-05-01 DIAGNOSIS — R059 Cough, unspecified: Secondary | ICD-10-CM

## 2016-05-01 DIAGNOSIS — R05 Cough: Secondary | ICD-10-CM | POA: Diagnosis not present

## 2016-05-01 MED ORDER — PANTOPRAZOLE SODIUM 40 MG PO TBEC: 40.0000 mg | DELAYED_RELEASE_TABLET | Freq: Every day | ORAL | 3 refills | Status: DC

## 2016-05-01 NOTE — Progress Notes (Signed)
Subjective:     Patient ID: Margaret Mitchell, female   DOB: Feb 01, 1940, 76 y.o.   MRN: [id number]  HPI Patient seen with concern for cough and reflux symptoms. She's had past history of reflux especially over the past couple weeks. She's had several episodes where she had acidic tasting liquid in her throat area followed by coughing. Cough has been nonproductive. No fevers or chills. She previously took Nexium with good relief and recently tried over-the-counter Prilosec 20 mg daily but has still had persistent heartburn symptoms. No recent hemoptysis. No dyspnea at rest. Recent nuclear stress test unremarkable. Patient also tried Gaviscon without relief. No dysphagia. No appetite or weight changes.  Past Medical History:  Diagnosis Date  . Allergy, unspecified not elsewhere classified   . Anxiety   . Arthritis   . Cervical dysplasia    prior to hysterectomy  . GERD (gastroesophageal reflux disease)   . Headache(784.0)   . Hypertension   . IBS (irritable bowel syndrome)   . Osteopenia 12/2015   T score -1.1 FRAX 3.7%/0.4%  . Pelvic adhesions   . Pelvic pain in female   . Sinus infection   . Thyroid disease    Parathyroid cyst   Past Surgical History:  Procedure Laterality Date  . ABDOMINAL HYSTERECTOMY  1983   TAH/BSO for pelvic pain and adhesions  . APPENDECTOMY  1994  . CATARACT EXTRACTION Bilateral 2013  . COLPOSCOPY    . FOOT SURGERY Bilateral   . PARATHYROID ADENOMA REMOVED  02/1980  . ROTATOR CUFF REPAIR     X2  . SHOULDER INJECTION  10/2011  . TONSILLECTOMY    . TUBAL LIGATION  1981  . VESICOVAGINAL FISTULA CLOSURE W/ TAH      reports that she has never smoked. She has never used smokeless tobacco. She reports that she drinks about 0.6 oz of alcohol per week . She reports that she does not use drugs. family history includes Diabetes in her maternal aunt, maternal grandmother, and mother; Heart disease in her maternal grandmother; Heart failure in her paternal  grandmother; Hypertension in her maternal grandmother; Leukemia in her brother; Lung cancer in her father; Stroke in her maternal grandmother. Allergies  Allergen Reactions  . Prednisone     REACTION: heart palpatations  In larger doses  . Seldane [Terfenadine]     SELDANE  . Sulfonamide Derivatives     REACTION: pt unable to remember  . Vioxx [Rofecoxib]     REACTION: mouth swelling (VIOXX)     Review of Systems  Constitutional: Negative for appetite change and unexpected weight change.  HENT: Negative for trouble swallowing.   Respiratory: Positive for cough. Negative for shortness of breath and wheezing.   Cardiovascular: Negative for chest pain.  Gastrointestinal: Negative for abdominal pain.       Objective:   Physical Exam  Constitutional: She appears well-developed and well-nourished.  HENT:  Mouth/Throat: Oropharynx is clear and moist.  Neck: Neck supple.  Cardiovascular: Normal rate and regular rhythm.   Pulmonary/Chest: Effort normal and breath sounds normal. No respiratory distress. She has no wheezes. She has no rales.       Assessment:     #1 dry cough probably related to reflux  #2 recent exacerbation of GERD. She's not any red flags such as dysphagia, appetite change, or pain with swallowing    Plan:     -Generic Protonix 40 mg once daily -May supplement with Zantac 150 mg or Pepcid 20 mg twice daily -Handout  on GERD given -Avoid eating within 3 hours of bedtime -Touch base if cough and GERD symptoms not resolving over the next couple weeks -Recommend tapering back off Protonix within the next 6-8 weeks if symptoms fully controlled  Kristian Covey MD [facility] at [facility]

## 2016-05-01 NOTE — Patient Instructions (Signed)
Food Choices for Gastroesophageal Reflux Disease, Adult When you have gastroesophageal reflux disease (GERD), the foods you eat and your eating habits are very important. Choosing the right foods can help ease the discomfort of GERD. Consider working with a diet and nutrition specialist (dietitian) to help you make healthy food choices. What general guidelines should I follow? Eating plan   Choose healthy foods low in fat, such as fruits, vegetables, whole grains, low-fat dairy products, and lean meat, fish, and poultry.  Eat frequent, small meals instead of three large meals each day. Eat your meals slowly, in a relaxed setting. Avoid bending over or lying down until 2-3 hours after eating.  Limit high-fat foods such as fatty meats or fried foods.  Limit your intake of oils, butter, and shortening to less than 8 teaspoons each day.  Avoid the following:  Foods that cause symptoms. These may be different for different people. Keep a food diary to keep track of foods that cause symptoms.  Alcohol.  Drinking large amounts of liquid with meals.  Eating meals during the 2-3 hours before bed.  Cook foods using methods other than frying. This may include baking, grilling, or broiling. Lifestyle    Maintain a healthy weight. Ask your health care provider what weight is healthy for you. If you need to lose weight, work with your health care provider to do so safely.  Exercise for at least 30 minutes on 5 or more days each week, or as told by your health care provider.  Avoid wearing clothes that fit tightly around your waist and chest.  Do not use any products that contain nicotine or tobacco, such as cigarettes and e-cigarettes. If you need help quitting, ask your health care provider.  Sleep with the head of your bed raised. Use a wedge under the mattress or blocks under the bed frame to raise the head of the bed. What foods are not recommended? The items listed may not be a complete  list. Talk with your dietitian about what dietary choices are best for you. Grains  Pastries or quick breads with added fat. French toast. Vegetables  Deep fried vegetables. French fries. Any vegetables prepared with added fat. Any vegetables that cause symptoms. For some people this may include tomatoes and tomato products, chili peppers, onions and garlic, and horseradish. Fruits  Any fruits prepared with added fat. Any fruits that cause symptoms. For some people this may include citrus fruits, such as oranges, grapefruit, pineapple, and lemons. Meats and other protein foods  High-fat meats, such as fatty beef or pork, hot dogs, ribs, ham, sausage, salami and bacon. Fried meat or protein, including fried fish and fried chicken. Nuts and nut butters. Dairy  Whole milk and chocolate milk. Sour cream. Cream. Ice cream. Cream cheese. Milk shakes. Beverages  Coffee and tea, with or without caffeine. Carbonated beverages. Sodas. Energy drinks. Fruit juice made with acidic fruits (such as orange or grapefruit). Tomato juice. Alcoholic drinks. Fats and oils  Butter. Margarine. Shortening. Ghee. Sweets and desserts  Chocolate and cocoa. Donuts. Seasoning and other foods  Pepper. Peppermint and spearmint. Any condiments, herbs, or seasonings that cause symptoms. For some people, this may include curry, hot sauce, or vinegar-based salad dressings. Summary  When you have gastroesophageal reflux disease (GERD), food and lifestyle choices are very important to help ease the discomfort of GERD.  Eat frequent, small meals instead of three large meals each day. Eat your meals slowly, in a relaxed setting. Avoid bending over   or lying down until 2-3 hours after eating.  Limit high-fat foods such as fatty meat or fried foods. This information is not intended to replace advice given to you by your health care provider. Make sure you discuss any questions you have with your health care provider. Document  Released: 12/31/2004 Document Revised: 01/02/2016 Document Reviewed: 01/02/2016 Elsevier Interactive Patient Education  2017 [employer].  Also consider OTC Zantac 150 mg or Pepcid 20 mg twice daily

## 2016-05-01 NOTE — Progress Notes (Signed)
Pre visit review using our clinic review tool, if applicable. No additional management support is needed unless otherwise documented below in the visit note. 

## 2016-05-28 NOTE — Telephone Encounter (Signed)
Talked to husband - pt was running errands - explained that i wanted to reschedule her AWV from 1/18 -she will call us back.

## 2016-07-06 ENCOUNTER — Other Ambulatory Visit: Payer: Self-pay | Admitting: Family Medicine

## 2016-07-25 ENCOUNTER — Encounter: Payer: Self-pay | Admitting: Family Medicine

## 2016-08-13 DIAGNOSIS — H3561 Retinal hemorrhage, right eye: Secondary | ICD-10-CM | POA: Diagnosis not present

## 2016-08-13 DIAGNOSIS — H33311 Horseshoe tear of retina without detachment, right eye: Secondary | ICD-10-CM | POA: Diagnosis not present

## 2016-08-14 DIAGNOSIS — H43813 Vitreous degeneration, bilateral: Secondary | ICD-10-CM | POA: Diagnosis not present

## 2016-08-14 DIAGNOSIS — H33321 Round hole, right eye: Secondary | ICD-10-CM | POA: Diagnosis not present

## 2016-08-14 DIAGNOSIS — H4311 Vitreous hemorrhage, right eye: Secondary | ICD-10-CM | POA: Diagnosis not present

## 2016-08-14 DIAGNOSIS — H43392 Other vitreous opacities, left eye: Secondary | ICD-10-CM | POA: Diagnosis not present

## 2016-08-21 DIAGNOSIS — M65332 Trigger finger, left middle finger: Secondary | ICD-10-CM | POA: Diagnosis not present

## 2016-08-27 DIAGNOSIS — H33321 Round hole, right eye: Secondary | ICD-10-CM | POA: Diagnosis not present

## 2016-08-30 ENCOUNTER — Telehealth: Payer: Self-pay | Admitting: Family Medicine

## 2016-08-30 NOTE — Telephone Encounter (Signed)
Pt calling in wanted to see if Dr. Elease Hashimoto could call in something for her migraine headache.  Pharm:  [facility] on [address]

## 2016-08-30 NOTE — Telephone Encounter (Signed)
If OTC medications not working, really needs to be seen- especially at her age.

## 2016-08-30 NOTE — Telephone Encounter (Signed)
Patient is aware and will try OTC and follow up as needed.

## 2016-08-30 NOTE — Telephone Encounter (Signed)
Left message on machine for patient to return our call 

## 2016-09-03 ENCOUNTER — Other Ambulatory Visit: Payer: Self-pay | Admitting: Family Medicine

## 2016-09-11 DIAGNOSIS — H524 Presbyopia: Secondary | ICD-10-CM | POA: Diagnosis not present

## 2016-09-11 DIAGNOSIS — H5202 Hypermetropia, left eye: Secondary | ICD-10-CM | POA: Diagnosis not present

## 2016-09-11 DIAGNOSIS — H52222 Regular astigmatism, left eye: Secondary | ICD-10-CM | POA: Diagnosis not present

## 2016-09-11 DIAGNOSIS — H5201 Hypermetropia, right eye: Secondary | ICD-10-CM | POA: Diagnosis not present

## 2016-09-11 DIAGNOSIS — H04123 Dry eye syndrome of bilateral lacrimal glands: Secondary | ICD-10-CM | POA: Diagnosis not present

## 2016-09-11 DIAGNOSIS — H35033 Hypertensive retinopathy, bilateral: Secondary | ICD-10-CM | POA: Diagnosis not present

## 2016-09-11 DIAGNOSIS — H33321 Round hole, right eye: Secondary | ICD-10-CM | POA: Diagnosis not present

## 2016-09-11 DIAGNOSIS — H26493 Other secondary cataract, bilateral: Secondary | ICD-10-CM | POA: Diagnosis not present

## 2016-10-02 DIAGNOSIS — M65332 Trigger finger, left middle finger: Secondary | ICD-10-CM | POA: Diagnosis not present

## 2016-10-03 ENCOUNTER — Encounter: Payer: Self-pay | Admitting: Family Medicine

## 2016-10-07 ENCOUNTER — Encounter: Payer: Self-pay | Admitting: Family Medicine

## 2016-10-07 ENCOUNTER — Ambulatory Visit (INDEPENDENT_AMBULATORY_CARE_PROVIDER_SITE_OTHER): Payer: Medicare Other | Admitting: Family Medicine

## 2016-10-07 VITALS — BP 150/78 | HR 70 | Temp 98.0°F | Wt 210.2 lb

## 2016-10-07 DIAGNOSIS — G629 Polyneuropathy, unspecified: Secondary | ICD-10-CM

## 2016-10-07 DIAGNOSIS — G44229 Chronic tension-type headache, not intractable: Secondary | ICD-10-CM | POA: Diagnosis not present

## 2016-10-07 DIAGNOSIS — Z23 Encounter for immunization: Secondary | ICD-10-CM

## 2016-10-07 MED ORDER — GABAPENTIN 100 MG PO CAPS: ORAL_CAPSULE | ORAL | 3 refills | Status: DC

## 2016-10-07 NOTE — Progress Notes (Signed)
Subjective:     Patient ID: Margaret Mitchell, female   DOB: [birth year], 76 y.o.   MRN: [id number]  HPI Patient seen with complaints of bilateral burning feet and legs. She was actually seen for this over year ago and had several lab tests including hemoglobin A1c which was 6.1%. Her thyroid has been normal. B12 level 367. Serum protein electrophoresis was normal. Rare alcohol use. 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 We referred her to neurology but her husband had some health issues and she never went for that follow up. Her symptoms have progressed somewhat over the past rest recent months. She has never had any weakness. She rates her severity of symptoms 5 out of 10  She has history of restless leg syndrome but states these symptoms are different. No pain with ambulation  Also complains of several month history of almost daily until mild bifrontal headaches. Has taken Excedrin with mild relief. She had seen neurologist once and had what sounds like some type of injection therapy and did not wish to pursue that. Denies any focal neurologic symptoms. No exertional headaches.  Past Medical History:  Diagnosis Date  . Allergy, unspecified not elsewhere classified   . Anxiety   . Arthritis   . Cervical dysplasia    prior to hysterectomy  . GERD (gastroesophageal reflux disease)   . Headache(784.0)   . Hypertension   . IBS (irritable bowel syndrome)   . Osteopenia 12/2015   T score -1.1 FRAX 3.7%/0.4%  . Pelvic adhesions   . Pelvic pain in female   . Sinus infection   . Thyroid disease    Parathyroid cyst   Past Surgical History:  Procedure Laterality Date  . ABDOMINAL HYSTERECTOMY  1983   TAH/BSO for pelvic pain and adhesions  . APPENDECTOMY  1994  . CATARACT EXTRACTION Bilateral 2013  . COLPOSCOPY    . FOOT SURGERY Bilateral   . PARATHYROID ADENOMA REMOVED  02/1980  . ROTATOR CUFF REPAIR     X2  . SHOULDER INJECTION  10/2011  . TONSILLECTOMY    . TUBAL  LIGATION  1981  . VESICOVAGINAL FISTULA CLOSURE W/ TAH      reports that she has never smoked. She has never used smokeless tobacco. She reports that she drinks about 0.6 oz of alcohol per week . She reports that she does not use drugs. family history includes Diabetes in her maternal aunt, maternal grandmother, and mother; Heart disease in her maternal grandmother; Heart failure in her paternal grandmother; Hypertension in her maternal grandmother; Leukemia in her brother; Lung cancer in her father; Stroke in her maternal grandmother. Allergies  Allergen Reactions  . Prednisone     REACTION: heart palpatations  In larger doses  . Seldane [Terfenadine]     SELDANE  . Sulfonamide Derivatives     REACTION: pt unable to remember  . Vioxx [Rofecoxib]     REACTION: mouth swelling (VIOXX)     Review of Systems  Constitutional: Negative for fatigue.  Eyes: Negative for visual disturbance.  Respiratory: Negative for cough, chest tightness, shortness of breath and wheezing.   Cardiovascular: Negative for chest pain, palpitations and leg swelling.  Neurological: Negative for dizziness, seizures, syncope, weakness, light-headedness and headaches.       Objective:   Physical Exam  Constitutional: She is oriented to person, place, and time. She appears well-developed and well-nourished.  Eyes: Pupils are equal, round, and reactive to light.  Neck: Neck supple. No JVD present.  No thyromegaly present.  Cardiovascular: Normal rate and regular rhythm.  Exam reveals no gallop.   Pulmonary/Chest: Effort normal and breath sounds normal. No respiratory distress. She has no wheezes. She has no rales.  Musculoskeletal: She exhibits no edema.  Neurological: She is alert and oriented to person, place, and time. No cranial nerve deficit. Coordination normal.  Full-strength lower extremities. She has only trace reflexes knee and ankle bilaterally. Normal sensory function touch       Assessment:      Bilateral sensory neuropathy symptoms. Previous screening for thyroid, diabetes, B12 normal. Previous normal serum protein electrophoresis. She does have history of restless leg syndrome but states these symptoms are different.  Daily tension-type headaches    Plan:     -We recommended avoiding daily use of analgesics such as Tylenol -We discussed possible further evaluation neurology and at this point she does not wish to go -Recommend trial gabapentin 100 mg daily at bedtime may titrate up one tablet every few days to dosage of 300 mg as needed. We elected not to start daytime dosing since she has rare daytime symptoms. -Follow-up in 3 weeks to reassess  Kristian Covey MD [facility]

## 2016-10-07 NOTE — Patient Instructions (Signed)
Start the Gabapentin at one per night.   May increase by one every 3 days up to 3 tablets per night Take about one hour prior to sleep.

## 2016-10-24 DIAGNOSIS — L905 Scar conditions and fibrosis of skin: Secondary | ICD-10-CM | POA: Diagnosis not present

## 2016-11-04 ENCOUNTER — Encounter: Payer: Self-pay | Admitting: Family Medicine

## 2016-11-04 ENCOUNTER — Ambulatory Visit (INDEPENDENT_AMBULATORY_CARE_PROVIDER_SITE_OTHER): Payer: Medicare Other | Admitting: Family Medicine

## 2016-11-04 VITALS — BP 132/80 | HR 58 | Temp 98.2°F | Wt 211.6 lb

## 2016-11-04 DIAGNOSIS — G629 Polyneuropathy, unspecified: Secondary | ICD-10-CM

## 2016-11-04 DIAGNOSIS — I10 Essential (primary) hypertension: Secondary | ICD-10-CM

## 2016-11-04 LAB — BASIC METABOLIC PANEL
BUN: 15 mg/dL (ref 6–23)
CALCIUM: 9.3 mg/dL (ref 8.4–10.5)
CO2: 27 meq/L (ref 19–32)
Chloride: 104 mEq/L (ref 96–112)
Creatinine, Ser: 0.86 mg/dL (ref 0.40–1.20)
GFR: 82.85 mL/min (ref >60.00)
Glucose, Bld: 82 mg/dL (ref 70–99)
Potassium: 4.2 mEq/L (ref 3.5–5.1)
SODIUM: 140 meq/L (ref 135–145)

## 2016-11-04 NOTE — Progress Notes (Signed)
Subjective:     Patient ID: Margaret Mitchell, female   DOB: [birth year], 76 y.o.   MRN: [id number]  HPI Patient here for follow-up for lower extremity paresthesias and neuropathy type symptoms. Symptoms were worse at night. We started gabapentin currently 100 mg daily at bedtime. Her leg symptoms are greatly improved though she has continued chronic insomnia. She is resting better overall. She's also noticed that her headaches are less frequent. Overall, she is pleased with results. No major side effects.  Hypertension treated with amlodipine benazepril combination therapy. No dizziness. No headaches. No recent chest pains. Appetite and weight are stable.  Past Medical History:  Diagnosis Date  . Allergy, unspecified not elsewhere classified   . Anxiety   . Arthritis   . Cervical dysplasia    prior to hysterectomy  . GERD (gastroesophageal reflux disease)   . Headache(784.0)   . Hypertension   . IBS (irritable bowel syndrome)   . Osteopenia 12/2015   T score -1.1 FRAX 3.7%/0.4%  . Pelvic adhesions   . Pelvic pain in female   . Sinus infection   . Thyroid disease    Parathyroid cyst   Past Surgical History:  Procedure Laterality Date  . ABDOMINAL HYSTERECTOMY  1983   TAH/BSO for pelvic pain and adhesions  . APPENDECTOMY  1994  . CATARACT EXTRACTION Bilateral 2013  . COLPOSCOPY    . FOOT SURGERY Bilateral   . PARATHYROID ADENOMA REMOVED  02/1980  . ROTATOR CUFF REPAIR     X2  . SHOULDER INJECTION  10/2011  . TONSILLECTOMY    . TUBAL LIGATION  1981  . VESICOVAGINAL FISTULA CLOSURE W/ TAH      reports that she has never smoked. She has never used smokeless tobacco. She reports that she drinks about 0.6 oz of alcohol per week . She reports that she does not use drugs. family history includes Diabetes in her maternal aunt, maternal grandmother, and mother; Heart disease in her maternal grandmother; Heart failure in her paternal grandmother; Hypertension in her maternal grandmother;  Leukemia in her brother; Lung cancer in her father; Stroke in her maternal grandmother. Allergies  Allergen Reactions  . Prednisone     REACTION: heart palpatations  In larger doses  . Seldane [Terfenadine]     SELDANE  . Sulfonamide Derivatives     REACTION: pt unable to remember  . Vioxx [Rofecoxib]     REACTION: mouth swelling (VIOXX)     Review of Systems  Constitutional: Negative for fatigue.  Eyes: Negative for visual disturbance.  Respiratory: Negative for cough, chest tightness, shortness of breath and wheezing.   Cardiovascular: Negative for chest pain, palpitations and leg swelling.  Endocrine: Negative for polydipsia and polyuria.  Neurological: Negative for dizziness, seizures, syncope, weakness, light-headedness and headaches.       Objective:   Physical Exam  Constitutional: She appears well-developed and well-nourished.  Neck: Neck supple.  Cardiovascular: Normal rate and regular rhythm.   Pulmonary/Chest: Effort normal and breath sounds normal. No respiratory distress. She has no wheezes. She has no rales.  Musculoskeletal: She exhibits no edema.       Assessment:     #1 hypertension stable and at goal  #2 peripheral neuropathy symptoms improved on gabapentin    Plan:     -Continue current medication -Check basic metabolic panel -Flu vaccine already given -Routine follow-up in 6 months and sooner as needed  Eulas Post MD [facility]

## 2016-12-02 DIAGNOSIS — Z1231 Encounter for screening mammogram for malignant neoplasm of breast: Secondary | ICD-10-CM | POA: Diagnosis not present

## 2016-12-04 ENCOUNTER — Encounter: Payer: Self-pay | Admitting: Gynecology

## 2016-12-04 ENCOUNTER — Ambulatory Visit (INDEPENDENT_AMBULATORY_CARE_PROVIDER_SITE_OTHER): Payer: Medicare Other | Admitting: Gynecology

## 2016-12-04 VITALS — BP 124/82 | Ht 67.0 in | Wt 206.0 lb

## 2016-12-04 DIAGNOSIS — Z01411 Encounter for gynecological examination (general) (routine) with abnormal findings: Secondary | ICD-10-CM

## 2016-12-04 DIAGNOSIS — M8580 Other specified disorders of bone density and structure, unspecified site: Secondary | ICD-10-CM

## 2016-12-04 DIAGNOSIS — N952 Postmenopausal atrophic vaginitis: Secondary | ICD-10-CM

## 2016-12-04 NOTE — Patient Instructions (Signed)
Follow-up in 1 year, sooner if any issues 

## 2016-12-04 NOTE — Progress Notes (Signed)
MCKENZE NISSLEY 07/10/1940 [id number]        76 y.o.  G1P0010 for breast and pelvic exam  Past medical history,surgical history, problem list, medications, allergies, family history and social history were all reviewed and documented as reviewed in the EPIC chart.  ROS:  Performed with pertinent positives and negatives included in the history, assessment and plan.   Additional significant findings : None   Exam: Kennon Portela assistant Vitals:   12/04/16 1359  BP: 124/82  Weight: 206 lb (93.4 kg)  Height: 5\' 7"  (1.702 m)   Body mass index is 32.26 kg/m.  General appearance:  Normal affect, orientation and appearance. Skin: Grossly normal HEENT: Without gross lesions.  No cervical or supraclavicular adenopathy. Thyroid normal.  Lungs:  Clear without wheezing, rales or rhonchi Cardiac: RR, without RMG Abdominal:  Soft, nontender, without masses, guarding, rebound, organomegaly or hernia Breasts:  Examined lying and sitting without masses, retractions, discharge or axillary adenopathy. Pelvic:  Ext, BUS, Vagina: With atrophic changes  Adnexa: Without masses or tenderness    Anus and perineum: Normal   Rectovaginal: Normal sphincter tone without palpated masses or tenderness.    Assessment/Plan:  76 y.o. G1P0010 female for breast and pelvic exam.   1. Postmenopausal/atrophic genital changes.  Status post TAH/BSO 1983 for pelvic pain and adhesions.  No significant hot flushes, night sweats or vaginal dryness. 2. Osteopenia.  DEXA 12/2015 T score -1.1 FRAX 3.7% / 0.4%. 3. Mammography 11/2016.  Continue with annual mammography next year.  Breast exam normal today. 4. Colonoscopy 2017.  Repeat at their recommended interval. 5. Pap smear 2011.  No Pap smear done today.  History of dysplasia before her hysterectomy with normal Pap smears since.  We reviewed current screening guidelines and we both agree to stop screening based on hysterectomy history and age. 6. Health  maintenance.  No routine lab work done as patient does this elsewhere.  Follow-up 1 year, sooner as needed.   Dara Lords MD, 2:24 PM 12/04/2016

## 2016-12-31 ENCOUNTER — Ambulatory Visit (INDEPENDENT_AMBULATORY_CARE_PROVIDER_SITE_OTHER): Payer: Medicare Other | Admitting: Family Medicine

## 2016-12-31 ENCOUNTER — Ambulatory Visit: Payer: Self-pay | Admitting: *Deleted

## 2016-12-31 ENCOUNTER — Encounter: Payer: Self-pay | Admitting: Family Medicine

## 2016-12-31 VITALS — BP 132/78 | HR 69 | Temp 98.2°F | Ht 67.0 in | Wt 206.2 lb

## 2016-12-31 DIAGNOSIS — R0602 Shortness of breath: Secondary | ICD-10-CM

## 2016-12-31 DIAGNOSIS — R002 Palpitations: Secondary | ICD-10-CM

## 2016-12-31 DIAGNOSIS — M65331 Trigger finger, right middle finger: Secondary | ICD-10-CM | POA: Diagnosis not present

## 2016-12-31 DIAGNOSIS — M65332 Trigger finger, left middle finger: Secondary | ICD-10-CM | POA: Diagnosis not present

## 2016-12-31 NOTE — Progress Notes (Signed)
    Subjective:  Margaret Mitchell is a 76 y.o. female who presents today with a chief complaint of shortness of breath.   HPI:  Shortness of breath, acute issue Symptoms started last week.  Symptoms are episodic in nature.  No clear triggers.  Symptoms occur randomly and at rest.  Symptoms are nonexertional.  Describes episodes where she will develop a feeling of shortness of breath, headache, and occasional palpitation.  Symptoms last for about 5 minutes and then resolve spontaneously.  Patient notes that she has been under quite a bit of stress related to her husband's diagnosis of progressive supranuclear palsy.  She thinks that most of her symptoms were related to stress.  No current shortness of breath, palpitations, or chest pain.  She called the nurse triage line who told her that she needed to be seen today.  Palpitations, acute issue Symptoms started several months ago, for the above-mentioned symptoms started.  Again no clear precipitating events.  Symptoms seem to occur randomly.  No chest pain.  No shortness of breath during the episodes.  She will notice symptoms for a few moments and then subside.  No clear precipitating events.  No syncopal episodes.  ROS: Per HPI  PMH: Smoking history reviewed.  Never smoker.  Objective:  Physical Exam: BP 132/78 (BP Location: Left Arm, Patient Position: Sitting, Cuff Size: Normal)   Pulse 69   Temp 98.2 F (36.8 C) (Oral)   Ht 5\' 7"  (1.702 m)   Wt 206 lb 3.2 oz (93.5 kg)   SpO2 98%   BMI 32.30 kg/m   Gen: NAD, resting comfortably CV: RRR with no murmurs appreciated Pulm: NWOB, CTAB with no crackles, wheezes, or rhonchi GI: Normal bowel sounds present. Soft, Nontender, Nondistended. MSK: No edema, cyanosis, or clubbing noted Skin: Warm, dry Neuro: Grossly normal, moves all extremities Psych: Normal affect and thought content  EKG: Normal sinus rhythm. VR 60 No ischemic changes. Normal QT interval.   Assessment/Plan:    Dyspneic Episodes Normal lung exam today with normal vital signs.  EKG today normal.  Her symptoms are nonexertional and occur randomly-doubt primary cardiac or pulmonary etiology.  Likely secondary to stress as noted above.  We will check CBC, TSH, and CMET to rule out other causes.  She will be following up with her PCP within the next week to discuss her stress levels and options for treatment.  Palpitations Her EKG today is normal without any indication of etiology.  As noted above, will check CBC, CMET, and TSH.  Instructed patient to avoid caffeine and alcohol and any other triggers.  Given that her symptoms started prior to her recent life stressors, we cannot attribute her palpitations to this.  We will arrange for her to have Holter monitoring to rule out underlying arrhythmias.  Time Spent: I spent 25 minutes face-to-face with the patient, with more than half spent on counseling patient on her life stressors, and discussing workup for her dyspneic episodes of palpitations and next steps of management.  Algis Greenhouse. Jerline Pain, MD 12/31/2016 3:42 PM

## 2016-12-31 NOTE — Telephone Encounter (Signed)
Patient is experiencing new SOB- intermittent, headache. Patient has palpitations that are becoming more frequent- she does not relate them to the SOB episodes that she has been having.   Reason for Disposition . [1] MILD difficulty breathing (e.g., minimal/no SOB at rest, SOB with walking, pulse <100) AND [2] NEW-onset or WORSE than normal  Answer Assessment - Initial Assessment Questions 1. RESPIRATORY STATUS: "Describe your breathing?" (e.g., wheezing, shortness of breath, unable to speak, severe coughing)      Patient feels like she has been holding her breath or she has walked to the top of the stairs 2. ONSET: "When did this breathing problem begin?"      Last week patient noticed it more 3. PATTERN "Does the difficult breathing come and go, or has it been constant since it started?"      Comes and goes 4. SEVERITY: "How bad is your breathing?" (e.g., mild, moderate, severe)    - MILD: No SOB at rest, mild SOB with walking, speaks normally in sentences, can lay down, no retractions, pulse < 100.    - MODERATE: SOB at rest, SOB with minimal exertion and prefers to sit, cannot lie down flat, speaks in phrases, mild retractions, audible wheezing, pulse 100-120.    - SEVERE: Very SOB at rest, speaks in single words, struggling to breathe, sitting hunched forward, retractions, pulse > 120      mild 5. RECURRENT SYMPTOM: "Have you had difficulty breathing before?" If so, ask: "When was the last time?" and "What happened that time?"      It has happened before- she attributed it to stress 6. CARDIAC HISTORY: "Do you have any history of heart disease?" (e.g., heart attack, angina, bypass surgery, angioplasty)      no 7. LUNG HISTORY: "Do you have any history of lung disease?"  (e.g., pulmonary embolus, asthma, emphysema)     no 8. CAUSE: "What do you think is causing the breathing problem?"      Stress related- husband has been ill- primary care giver for husband 9. OTHER SYMPTOMS: "Do you  have any other symptoms? (e.g., dizziness, runny nose, cough, chest pain, fever)     Patient cried all day yesterday, headache- possible migraine- light sensitive 10. PREGNANCY: "Is there any chance you are pregnant?" "When was your last menstrual period?"       n/a 11. TRAVEL: "Have you traveled out of the country in the last month?" (e.g., travel history, exposures)       n/a  Protocols used: BREATHING DIFFICULTY-A-AH

## 2016-12-31 NOTE — Telephone Encounter (Signed)
This is not our patient. Thank you

## 2016-12-31 NOTE — Patient Instructions (Addendum)
We will check blood work and set you up to have a cardiac monitor.  Please avoid alcohol and caffeine.  Please schedule an appointment to talk with Dr Elease Hashimoto regarding your stress levels soon.  Take care,  Dr Ramiro Harvest

## 2016-12-31 NOTE — Telephone Encounter (Signed)
I am aware that this patient does not come to your office- but there were not appointments available at [facility] and [facility] practices allow scheduling across providers.

## 2017-01-01 LAB — CBC
HCT: 41.5 % (ref 36.0–46.0)
Hemoglobin: 13.6 g/dL (ref 12.0–15.0)
MCHC: 32.8 g/dL (ref 30.0–36.0)
MCV: 94.4 fl (ref 78.0–100.0)
PLATELETS: 303 10*3/uL (ref 150.0–400.0)
RBC: 4.39 Mil/uL (ref 3.87–5.11)
RDW: 13 % (ref 11.5–15.5)
WBC: 7.6 10*3/uL (ref 4.0–10.5)

## 2017-01-01 LAB — COMPREHENSIVE METABOLIC PANEL
ALK PHOS: 87 U/L (ref 39–117)
ALT: 14 U/L (ref 0–35)
AST: 19 U/L (ref 0–37)
Albumin: 4.3 g/dL (ref 3.5–5.2)
BILIRUBIN TOTAL: 0.4 mg/dL (ref 0.2–1.2)
BUN: 15 mg/dL (ref 6–23)
CO2: 28 meq/L (ref 19–32)
Calcium: 9.4 mg/dL (ref 8.4–10.5)
Chloride: 104 mEq/L (ref 96–112)
Creatinine, Ser: 0.94 mg/dL (ref 0.40–1.20)
GFR: 74.73 mL/min (ref >60.00)
Glucose, Bld: 114 mg/dL — ABNORMAL HIGH (ref 70–99)
Potassium: 4.3 mEq/L (ref 3.5–5.1)
Sodium: 140 mEq/L (ref 135–145)
Total Protein: 7.5 g/dL (ref 6.0–8.3)

## 2017-01-01 LAB — TSH: TSH: 1.13 u[IU]/mL (ref 0.35–4.50)

## 2017-01-02 NOTE — Progress Notes (Signed)
Labs all normal. Please inform patient.  We will await the results of her heart monitoring testing.  Algis Greenhouse. Jerline Pain, MD 01/02/2017 12:41 PM

## 2017-01-06 ENCOUNTER — Other Ambulatory Visit: Payer: Self-pay | Admitting: Family Medicine

## 2017-01-10 ENCOUNTER — Other Ambulatory Visit: Payer: Self-pay | Admitting: Family Medicine

## 2017-01-15 ENCOUNTER — Encounter: Payer: Self-pay | Admitting: Family Medicine

## 2017-01-15 ENCOUNTER — Ambulatory Visit (INDEPENDENT_AMBULATORY_CARE_PROVIDER_SITE_OTHER): Payer: Medicare Other | Admitting: Family Medicine

## 2017-01-15 VITALS — BP 130/80 | HR 65 | Temp 97.9°F | Wt 207.4 lb

## 2017-01-15 DIAGNOSIS — F419 Anxiety disorder, unspecified: Secondary | ICD-10-CM

## 2017-01-15 DIAGNOSIS — F439 Reaction to severe stress, unspecified: Secondary | ICD-10-CM

## 2017-01-15 NOTE — Patient Instructions (Signed)
Stress and Stress Management Stress is a normal reaction to life events. It is what you feel when life demands more than you are used to or more than you can handle. Some stress can be useful. For example, the stress reaction can help you catch the last bus of the day, study for a test, or meet a deadline at work. But stress that occurs too often or for too long can cause problems. It can affect your emotional health and interfere with relationships and normal daily activities. Too much stress can weaken your immune system and increase your risk for physical illness. If you already have a medical problem, stress can make it worse. What are the causes? All sorts of life events may cause stress. An event that causes stress for one person may not be stressful for another person. Major life events commonly cause stress. These may be positive or negative. Examples include losing your job, moving into a new home, getting married, having a baby, or losing a loved one. Less obvious life events may also cause stress, especially if they occur day after day or in combination. Examples include working long hours, driving in traffic, caring for children, being in debt, or being in a difficult relationship. What are the signs or symptoms? Stress may cause emotional symptoms including, the following:  Anxiety. This is feeling worried, afraid, on edge, overwhelmed, or out of control.  Anger. This is feeling irritated or impatient.  Depression. This is feeling sad, down, helpless, or guilty.  Difficulty focusing, remembering, or making decisions.  Stress may cause physical symptoms, including the following:  Aches and pains. These may affect your head, neck, back, stomach, or other areas of your body.  Tight muscles or clenched jaw.  Low energy or trouble sleeping.  Stress may cause unhealthy behaviors, including the following:  Eating to feel better (overeating) or skipping meals.  Sleeping too little,  too much, or both.  Working too much or putting off tasks (procrastination).  Smoking, drinking alcohol, or using drugs to feel better.  How is this diagnosed? Stress is diagnosed through an assessment by your health care provider. Your health care provider will ask questions about your symptoms and any stressful life events.Your health care provider will also ask about your medical history and may order blood tests or other tests. Certain medical conditions and medicine can cause physical symptoms similar to stress. Mental illness can cause emotional symptoms and unhealthy behaviors similar to stress. Your health care provider may refer you to a mental health professional for further evaluation. How is this treated? Stress management is the recommended treatment for stress.The goals of stress management are reducing stressful life events and coping with stress in healthy ways. Techniques for reducing stressful life events include the following:  Stress identification. Self-monitor for stress and identify what causes stress for you. These skills may help you to avoid some stressful events.  Time management. Set your priorities, keep a calendar of events, and learn to say "no." These tools can help you avoid making too many commitments.  Techniques for coping with stress include the following:  Rethinking the problem. Try to think realistically about stressful events rather than ignoring them or overreacting. Try to find the positives in a stressful situation rather than focusing on the negatives.  Exercise. Physical exercise can release both physical and emotional tension. The key is to find a form of exercise you enjoy and do it regularly.  Relaxation techniques. These relax the body and  mind. Examples include yoga, meditation, tai chi, biofeedback, deep breathing, progressive muscle relaxation, listening to music, being out in nature, journaling, and other hobbies. Again, the key is to find  one or more that you enjoy and can do regularly.  Healthy lifestyle. Eat a balanced diet, get plenty of sleep, and do not smoke. Avoid using alcohol or drugs to relax.  Strong support network. Spend time with family, friends, or other people you enjoy being around.Express your feelings and talk things over with someone you trust.  Counseling or talktherapy with a mental health professional may be helpful if you are having difficulty managing stress on your own. Medicine is typically not recommended for the treatment of stress.Talk to your health care provider if you think you need medicine for symptoms of stress. Follow these instructions at home:  Keep all follow-up visits as directed by your health care provider.  Take all medicines as directed by your health care provider. Contact a health care provider if:  Your symptoms get worse or you start having new symptoms.  You feel overwhelmed by your problems and can no longer manage them on your own. Get help right away if:  You feel like hurting yourself or someone else. This information is not intended to replace advice given to you by your health care provider. Make sure you discuss any questions you have with your health care provider. Document Released: 06/26/2000 Document Revised: 06/08/2015 Document Reviewed: 08/25/2012 [employer] Interactive Patient Education  2017 [employer]  

## 2017-01-15 NOTE — Progress Notes (Signed)
Subjective:     Patient ID: Margaret Mitchell, female   DOB: 18-Aug-1940, 77 y.o.   MRN: [id number]  HPI Patient is here to discuss stress and anxiety issues. She was seen at another clinic recently with some palpitations. Those were very transient. Her EKG was unremarkable. She had some labs including thyroid, CBC, chemistries and these were all stable.  Her biggest stress is the fact that her husband has multiple medical problems. He interacts very little with her in this has been very frustrating for her. She has occasional emotional outbursts with crying but these are not consistently day to day. Her husband has progressive supranuclear palsy and this has been her major stress. Patient has history of Valium overdose back in 1973 and is very reluctant to consider medication options. She is not currently getting any counseling. No alcohol use.  She is active in church and also has a couple close friend she feels comfortable talking with. She denies any suicidal ideation  Past Medical History:  Diagnosis Date  . Allergy, unspecified not elsewhere classified   . Anxiety   . Arthritis   . Cervical dysplasia    prior to hysterectomy  . GERD (gastroesophageal reflux disease)   . Headache(784.0)   . Hypertension   . IBS (irritable bowel syndrome)   . Osteopenia 12/2015   T score -1.1 FRAX 3.7%/0.4%  . Pelvic adhesions   . Pelvic pain in female   . Sinus infection   . Thyroid disease    Parathyroid cyst   Past Surgical History:  Procedure Laterality Date  . ABDOMINAL HYSTERECTOMY  1983   TAH/BSO for pelvic pain and adhesions  . APPENDECTOMY  1994  . CATARACT EXTRACTION Bilateral 2013  . COLPOSCOPY    . FOOT SURGERY Bilateral   . PARATHYROID ADENOMA REMOVED  02/1980  . ROTATOR CUFF REPAIR     X2  . SHOULDER INJECTION  10/2011  . TONSILLECTOMY    . TUBAL LIGATION  1981  . VESICOVAGINAL FISTULA CLOSURE W/ TAH      reports that  has never smoked. she has never used smokeless tobacco.  She reports that she drinks about 0.6 oz of alcohol per week. She reports that she does not use drugs. family history includes Diabetes in her maternal aunt, maternal grandmother, and mother; Heart disease in her maternal grandmother; Heart failure in her paternal grandmother; Hypertension in her maternal grandmother; Leukemia in her brother; Lung cancer in her father; Stroke in her maternal grandmother. Allergies  Allergen Reactions  . Prednisone     REACTION: heart palpatations  In larger doses  . Seldane [Terfenadine]     SELDANE  . Sulfonamide Derivatives     REACTION: pt unable to remember  . Vioxx [Rofecoxib]     REACTION: mouth swelling (VIOXX)     Review of Systems  Constitutional: Negative for appetite change and unexpected weight change.  Respiratory: Negative for shortness of breath.   Cardiovascular: Negative for chest pain.  Psychiatric/Behavioral: Positive for dysphoric mood. Negative for agitation, confusion and suicidal ideas. The patient is nervous/anxious.        Objective:   Physical Exam  Constitutional: She is oriented to person, place, and time. She appears well-developed and well-nourished.  Cardiovascular: Normal rate and regular rhythm.  Pulmonary/Chest: Effort normal and breath sounds normal. No respiratory distress. She has no wheezes. She has no rales.  Neurological: She is alert and oriented to person, place, and time. No cranial nerve deficit.  Psychiatric: She  has a normal mood and affect.  PHQ-9 score is 1       Assessment:     Situational stress. PHQ-9 score is 1.  She relates most of her stress related to issues regarding her husband's chronic medical problems.      Plan:     -We recommend she consider counseling but she declines at this time. She will consider this. She was given a brochure with our behavioral health division to consider calling and setting up -We discussed nonpharmacologic ways of handling stress and anxiety-such as  adequate sleep, exercise, staying engaged in hobbies -We discussed potential role of antidepressants but based on pH Q9 score and her personal preference she would not be interested at this point and not clearly indicated. Would definitely avoid benzodiazepines- and she agrees. - We spent over 25 minutes with patient of which greater than 50% in direct interaction-counseling regarding above issues-possible pharmacologic options for treatment, nonpharmacologic ways of handling stress and anxiety     Kristian Covey MD [facility] at [facility]

## 2017-03-03 DIAGNOSIS — B351 Tinea unguium: Secondary | ICD-10-CM | POA: Diagnosis not present

## 2017-03-05 ENCOUNTER — Other Ambulatory Visit: Payer: Self-pay | Admitting: Family Medicine

## 2017-04-15 ENCOUNTER — Ambulatory Visit (INDEPENDENT_AMBULATORY_CARE_PROVIDER_SITE_OTHER): Payer: Medicare Other | Admitting: Family Medicine

## 2017-04-15 ENCOUNTER — Encounter: Payer: Self-pay | Admitting: Family Medicine

## 2017-04-15 VITALS — BP 120/80 | HR 66 | Temp 97.9°F | Wt 206.9 lb

## 2017-04-15 DIAGNOSIS — R1030 Lower abdominal pain, unspecified: Secondary | ICD-10-CM | POA: Diagnosis not present

## 2017-04-15 DIAGNOSIS — R1031 Right lower quadrant pain: Secondary | ICD-10-CM | POA: Diagnosis not present

## 2017-04-15 LAB — CBC WITH DIFFERENTIAL/PLATELET
BASOS ABS: 0 10*3/uL (ref 0.0–0.1)
Basophils Relative: 0.4 % (ref 0.0–3.0)
EOS ABS: 0.1 10*3/uL (ref 0.0–0.7)
Eosinophils Relative: 1.6 % (ref 0.0–5.0)
HEMATOCRIT: 40.8 % (ref 36.0–46.0)
HEMOGLOBIN: 13.5 g/dL (ref 12.0–15.0)
LYMPHS PCT: 39.5 % (ref 12.0–46.0)
Lymphs Abs: 3.5 10*3/uL (ref 0.7–4.0)
MCHC: 33.1 g/dL (ref 30.0–36.0)
MCV: 93.3 fl (ref 78.0–100.0)
MONOS PCT: 10.4 % (ref 3.0–12.0)
Monocytes Absolute: 0.9 10*3/uL (ref 0.1–1.0)
Neutro Abs: 4.2 10*3/uL (ref 1.4–7.7)
Neutrophils Relative %: 48.1 % (ref 43.0–77.0)
Platelets: 320 10*3/uL (ref 150.0–400.0)
RBC: 4.37 Mil/uL (ref 3.87–5.11)
RDW: 12.9 % (ref 11.5–15.5)
WBC: 8.8 10*3/uL (ref 4.0–10.5)

## 2017-04-15 LAB — POCT URINALYSIS DIPSTICK
Bilirubin, UA: NEGATIVE
GLUCOSE UA: NEGATIVE
Ketones, UA: NEGATIVE
LEUKOCYTES UA: NEGATIVE
Nitrite, UA: NEGATIVE
PH UA: 6 (ref 5.0–8.0)
Protein, UA: NEGATIVE
RBC UA: NEGATIVE
Spec Grav, UA: 1.015 (ref 1.010–1.025)
UROBILINOGEN UA: 0.2 U/dL

## 2017-04-15 NOTE — Patient Instructions (Signed)

## 2017-04-15 NOTE — Progress Notes (Signed)
Subjective:     Patient ID: Margaret Mitchell, female   DOB: 1940/04/22, 77 y.o.   MRN: [id number]  HPI Patient seen as a work in with three-week history of some intermittent right lower quadrant abdominal pain. Some slight radiation toward the flank. Denies history of kidney stones. He states the pain is more "annoying "than anything. Severity is 4-5 out of 10. Not related to movement. No clear exacerbating or alleviating factors. Pain usually last about 30 minutes. She has some chronic constipation but no recent change in bowel habits.  Denies any associated fever, nausea, vomiting. Appetite and weight are stable. Denies urinary symptoms. Patient had colonoscopy January 2017 which was unremarkable. No history of diverticular disease. Patient's had previous total abdominal hysterectomy and BSO as well as appendectomy.  Past Medical History:  Diagnosis Date  . Allergy, unspecified not elsewhere classified   . Anxiety   . Arthritis   . Cervical dysplasia    prior to hysterectomy  . GERD (gastroesophageal reflux disease)   . Headache(784.0)   . Hypertension   . IBS (irritable bowel syndrome)   . Osteopenia 12/2015   T score -1.1 FRAX 3.7%/0.4%  . Pelvic adhesions   . Pelvic pain in female   . Sinus infection   . Thyroid disease    Parathyroid cyst   Past Surgical History:  Procedure Laterality Date  . ABDOMINAL HYSTERECTOMY  1983   TAH/BSO for pelvic pain and adhesions  . APPENDECTOMY  1994  . CATARACT EXTRACTION Bilateral 2013  . COLPOSCOPY    . FOOT SURGERY Bilateral   . PARATHYROID ADENOMA REMOVED  02/1980  . ROTATOR CUFF REPAIR     X2  . SHOULDER INJECTION  10/2011  . TONSILLECTOMY    . TUBAL LIGATION  1981  . VESICOVAGINAL FISTULA CLOSURE W/ TAH      reports that she has never smoked. She has never used smokeless tobacco. She reports that she drinks about 0.6 oz of alcohol per week. She reports that she does not use drugs. family history includes Diabetes in her maternal  aunt, maternal grandmother, and mother; Heart disease in her maternal grandmother; Heart failure in her paternal grandmother; Hypertension in her maternal grandmother; Leukemia in her brother; Lung cancer in her father; Stroke in her maternal grandmother. Allergies  Allergen Reactions  . Prednisone     REACTION: heart palpatations  In larger doses  . Seldane [Terfenadine]     SELDANE  . Sulfonamide Derivatives     REACTION: pt unable to remember  . Vioxx [Rofecoxib]     REACTION: mouth swelling (VIOXX)     Review of Systems  Constitutional: Negative for chills and fever.  Respiratory: Negative for shortness of breath.   Cardiovascular: Negative for chest pain.  Gastrointestinal: Positive for abdominal pain. Negative for abdominal distention, blood in stool, diarrhea, nausea and vomiting.  Genitourinary: Negative for dysuria, frequency and hematuria.  Skin: Negative for rash.       Objective:   Physical Exam  Constitutional: She appears well-developed and well-nourished.  Cardiovascular: Normal rate and regular rhythm.  Pulmonary/Chest: Effort normal and breath sounds normal. No respiratory distress. She has no wheezes. She has no rales.  Abdominal: Soft. Bowel sounds are normal. She exhibits no distension. There is no tenderness.  Scar from previous abdominal surgery. She has only very minimal tenderness to palpation right lower quadrant. No guarding or rebound. No mass palpated       Assessment:     Abdominal pain right  lower quadrant. Patient's had previous appendectomy as well as total abdominal hysterectomy and BSO. No history of diverticular disease. No history of kidney stones    Plan:     -Start with urinalysis and consider CBC with differential -Urine dip normal. -will check CBC as above. -Follow-up immediately for any fever, vomiting, progressive pain, or other concerns -consider CT abdomen and pelvis if not better in 2 weeks.  Kristian Covey MD [place]  [facility] at [facility]

## 2017-05-09 ENCOUNTER — Encounter: Payer: Self-pay | Admitting: Family Medicine

## 2017-05-09 ENCOUNTER — Ambulatory Visit (INDEPENDENT_AMBULATORY_CARE_PROVIDER_SITE_OTHER)
Admission: RE | Admit: 2017-05-09 | Discharge: 2017-05-09 | Disposition: A | Discharge status: Home / Self Care | Payer: Medicare Other | Source: Ambulatory Visit | Attending: Family Medicine | Admitting: Family Medicine

## 2017-05-09 ENCOUNTER — Ambulatory Visit (INDEPENDENT_AMBULATORY_CARE_PROVIDER_SITE_OTHER): Payer: Medicare Other | Admitting: Family Medicine

## 2017-05-09 VITALS — BP 120/80 | HR 64 | Temp 98.2°F | Wt 209.4 lb

## 2017-05-09 DIAGNOSIS — M545 Low back pain, unspecified: Secondary | ICD-10-CM

## 2017-05-09 NOTE — Progress Notes (Signed)
Subjective:     Patient ID: Margaret Mitchell, female   DOB: [birth year], 77 y.o.   MRN: [id number]  HPI Patient seen with persistent right lower lumbar back pain with radiation anterior. Refer to previous note. Urinalysis and CBC were unremarkable.  04-15-17 note "Patient seen as a work in with three-week history of some intermittent right lower quadrant abdominal pain. Some slight radiation toward the flank. Denies history of kidney stones. He states the pain is more "annoying "than anything. Severity is 4-5 out of 10. Not related to movement. No clear exacerbating or alleviating factors. Pain usually last about 30 minutes. She has some chronic constipation but no recent change in bowel habits.  Denies any associated fever, nausea, vomiting. Appetite and weight are stable. Denies urinary symptoms. Patient had colonoscopy January 2017 which was unremarkable. No history of diverticular disease. Patient's had previous total abdominal hysterectomy and BSO as well as appendectomy."  She states her pain is about the same but the main differences is become more constant where she was having about 30 minute bouts of pain previously. No appetite or weight changes. No fever. No dysuria. No exacerbating factors. She's taken some plain Tylenol at night which helps. Denies any hip pain. No skin rashes  Past Medical History:  Diagnosis Date  . Allergy, unspecified not elsewhere classified   . Anxiety   . Arthritis   . Cervical dysplasia    prior to hysterectomy  . GERD (gastroesophageal reflux disease)   . Headache(784.0)   . Hypertension   . IBS (irritable bowel syndrome)   . Osteopenia 12/2015   T score -1.1 FRAX 3.7%/0.4%  . Pelvic adhesions   . Pelvic pain in female   . Sinus infection   . Thyroid disease    Parathyroid cyst   Past Surgical History:  Procedure Laterality Date  . ABDOMINAL HYSTERECTOMY  1983   TAH/BSO for pelvic pain and adhesions  . APPENDECTOMY  1994  . CATARACT EXTRACTION  Bilateral 2013  . COLPOSCOPY    . FOOT SURGERY Bilateral   . PARATHYROID ADENOMA REMOVED  02/1980  . ROTATOR CUFF REPAIR     X2  . SHOULDER INJECTION  10/2011  . TONSILLECTOMY    . TUBAL LIGATION  1981  . VESICOVAGINAL FISTULA CLOSURE W/ TAH      reports that she has never smoked. She has never used smokeless tobacco. She reports that she drinks about 0.6 oz of alcohol per week. She reports that she does not use drugs. family history includes Diabetes in her maternal aunt, maternal grandmother, and mother; Heart disease in her maternal grandmother; Heart failure in her paternal grandmother; Hypertension in her maternal grandmother; Leukemia in her brother; Lung cancer in her father; Stroke in her maternal grandmother. Allergies  Allergen Reactions  . Prednisone     REACTION: heart palpatations  In larger doses  . Seldane [Terfenadine]     SELDANE  . Sulfonamide Derivatives     REACTION: pt unable to remember  . Vioxx [Rofecoxib]     REACTION: mouth swelling (VIOXX)       Review of Systems  Constitutional: Negative for appetite change, chills, fever and unexpected weight change.  Respiratory: Negative for shortness of breath.   Cardiovascular: Negative for chest pain.  Genitourinary: Negative for dysuria.  Musculoskeletal: Positive for back pain.  Skin: Negative for rash.       Objective:   Physical Exam  Constitutional: She appears well-developed and well-nourished.  Cardiovascular: Normal rate and regular  rhythm.  Pulmonary/Chest: Effort normal and breath sounds normal. No respiratory distress. She has no wheezes.  Abdominal: Soft. She exhibits no mass. There is no tenderness. There is no rebound and no guarding.  Musculoskeletal:  Straight leg raises are negative bilaterally  Neurological:  Trace reflexes knee and ankle bilaterally. She has full strength lower extremities       Assessment:     Right lower lumbar back pain progressive over several weeks. She has  some radiation toward the right lower pelvic region. Patient has had previous appendectomy as well as BSO and colonoscopy up-to-date. Suspect lumbar nerve root impingement. Nonfocal exam neurologically    Plan:     -Obtain plain x-rays lumbar spine to start with -Consider trial of physical therapy -may need MRI to help clarify.  Margaret Covey MD [place] [facility] at [facility]

## 2017-05-09 NOTE — Patient Instructions (Signed)

## 2017-05-14 ENCOUNTER — Telehealth: Payer: Self-pay | Admitting: Family Medicine

## 2017-05-14 NOTE — Telephone Encounter (Signed)
Copied from [place] [id number]. Topic: Quick Communication - See Telephone Encounter >> May 14, 2017  3:39 PM Oliver Pila B wrote: CRM for notification. See Telephone encounter for: 05/14/17.  Call pt to give lab results

## 2017-05-22 ENCOUNTER — Ambulatory Visit: Payer: Self-pay

## 2017-05-22 NOTE — Telephone Encounter (Signed)
Pt. 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 Reports it was mixed in with the stool. Has some issues with constipation. Also has had some issues with abdominal pain that  "Dr. Elease Hashimoto is aware of." Appointment made for Monday at her request. Instructed pt. If bleeding increases to go to ED. Verbalizes understanding.  Reason for Disposition . MILD rectal bleeding (more than just a few drops or streaks)  Answer Assessment - Initial Assessment Questions 1. APPEARANCE of BLOOD: "What color is it?" "Is it passed separately, on the surface of the stool, or mixed in with the stool?"      Bright red - mixed in with stool 2. AMOUNT: "How much blood was passed?"      Congealed in with stool 3. FREQUENCY: "How many times has blood been passed with the stools?"      One time 4. ONSET: "When was the blood first seen in the stools?" (Days or weeks)      This morning 5. DIARRHEA: "Is there also some diarrhea?" If so, ask: "How many diarrhea stools were passed in past 24 hours?"      No 6. CONSTIPATION: "Do you have constipation?" If so, "How bad is it?"     Some  7. RECURRENT SYMPTOMS: "Have you had blood in your stools before?" If so, ask: "When was the last time?" and "What happened that time?"      No 8. BLOOD THINNERS: "Do you take any blood thinners?" (e.g., Coumadin/warfarin, Pradaxa/dabigatran, aspirin)     No 9. OTHER SYMPTOMS: "Do you have any other symptoms?"  (e.g., abdominal pain, vomiting, dizziness, fever)     Lower right side - saw doctor for this 10. PREGNANCY: "Is there any chance you are pregnant?" "When was your last menstrual period?"       No  Protocols used: RECTAL BLEEDING-A-AH

## 2017-05-26 ENCOUNTER — Encounter: Payer: Self-pay | Admitting: Family Medicine

## 2017-05-26 ENCOUNTER — Ambulatory Visit (INDEPENDENT_AMBULATORY_CARE_PROVIDER_SITE_OTHER): Payer: Medicare Other | Admitting: Family Medicine

## 2017-05-26 VITALS — BP 110/80 | HR 86 | Temp 97.9°F | Wt 209.9 lb

## 2017-05-26 DIAGNOSIS — K648 Other hemorrhoids: Secondary | ICD-10-CM

## 2017-05-26 DIAGNOSIS — K921 Melena: Secondary | ICD-10-CM | POA: Diagnosis not present

## 2017-05-26 DIAGNOSIS — F418 Other specified anxiety disorders: Secondary | ICD-10-CM | POA: Diagnosis not present

## 2017-05-26 NOTE — Patient Instructions (Signed)
You have some minor internal hemorrhoids  Drink plenty of fluids  Eat plenty of fiber- at least 25 grams per day.    Let me know if you have any recurrent blood in stools.

## 2017-05-26 NOTE — Progress Notes (Signed)
Subjective:     Patient ID: Margaret Mitchell, female   DOB: 08-30-1940, 77 y.o.   MRN: [id number]  HPI Patient seen with episode last Thursday of minimal amount of bright red blood in stool. She has occasional constipation issues. No pain with bowel movements. No appetite or weight changes. She had colonoscopy January 2017 with internal hemorrhoids no polyps.  She had some bright red blood which seemed to be mixed with stool. This occurred on one occasion and none since then  Other issue is some general anxiety. She relates this to take care of her husband who has multiple health care needs. She feels like her frustration tolerance is low at times. She denies having depression symptoms. She is reluctant to taking medications.  Past Medical History:  Diagnosis Date  . Allergy, unspecified not elsewhere classified   . Anxiety   . Arthritis   . Cervical dysplasia    prior to hysterectomy  . GERD (gastroesophageal reflux disease)   . Headache(784.0)   . Hypertension   . IBS (irritable bowel syndrome)   . Osteopenia 12/2015   T score -1.1 FRAX 3.7%/0.4%  . Pelvic adhesions   . Pelvic pain in female   . Sinus infection   . Thyroid disease    Parathyroid cyst   Past Surgical History:  Procedure Laterality Date  . ABDOMINAL HYSTERECTOMY  1983   TAH/BSO for pelvic pain and adhesions  . APPENDECTOMY  1994  . CATARACT EXTRACTION Bilateral 2013  . COLPOSCOPY    . FOOT SURGERY Bilateral   . PARATHYROID ADENOMA REMOVED  02/1980  . ROTATOR CUFF REPAIR     X2  . SHOULDER INJECTION  10/2011  . TONSILLECTOMY    . TUBAL LIGATION  1981  . VESICOVAGINAL FISTULA CLOSURE W/ TAH      reports that she has never smoked. She has never used smokeless tobacco. She reports that she drinks about 0.6 oz of alcohol per week. She reports that she does not use drugs. family history includes Diabetes in her maternal aunt, maternal grandmother, and mother; Heart disease in her maternal grandmother; Heart  failure in her paternal grandmother; Hypertension in her maternal grandmother; Leukemia in her brother; Lung cancer in her father; Stroke in her maternal grandmother. Allergies  Allergen Reactions  . Prednisone     REACTION: heart palpatations  In larger doses  . Seldane [Terfenadine]     SELDANE  . Sulfonamide Derivatives     REACTION: pt unable to remember  . Vioxx [Rofecoxib]     REACTION: mouth swelling (VIOXX)     Review of Systems  Constitutional: Negative for appetite change, chills, fever and unexpected weight change.  Respiratory: Negative for shortness of breath.   Gastrointestinal: Positive for blood in stool and constipation. Negative for abdominal pain, diarrhea, nausea and vomiting.  Neurological: Negative for dizziness.  Psychiatric/Behavioral: Negative for dysphoric mood. The patient is nervous/anxious.        Objective:   Physical Exam  Constitutional: She appears well-developed and well-nourished.  Cardiovascular: Normal rate and regular rhythm.  Pulmonary/Chest: Effort normal and breath sounds normal.  Genitourinary:  Genitourinary Comments: Rectal exam reveals no visible fissures. No external hemorrhoids. Digital exam no mass. Hemoccult negative. Endoscopy performed which shows some internal hemorrhoids but no active bleeding. No other abnormalities noted.  Psychiatric: She has a normal mood and affect. Her behavior is normal.       Assessment:     #1 hematochezia probably related to internal hemorrhoid bleed.  Her colonoscopy is up-to-date and she has no history of polyps. She's had some recent constipation which made exacerbated  #2 situational anxiety largely related to difficult issues caring for her husband    Plan:     -Discussed measures to reduce constipation -No further evaluations time unless she has recurrent bloody stools or other concerns -Offered counseling regarding her situational anxiety. Recommended against any medications at this  point  Kristian Covey MD [place] [facility] at [facility]

## 2017-06-06 ENCOUNTER — Ambulatory Visit (INDEPENDENT_AMBULATORY_CARE_PROVIDER_SITE_OTHER): Payer: Medicare Other

## 2017-06-06 VITALS — BP 120/60 | HR 62 | Ht 66.5 in | Wt 210.0 lb

## 2017-06-06 DIAGNOSIS — Z0000 Encounter for general adult medical examination without abnormal findings: Secondary | ICD-10-CM

## 2017-06-06 NOTE — Progress Notes (Addendum)
Subjective:   Margaret Mitchell is a 77 y.o. female who presents for Medicare Annual (Subsequent) preventive examination.  Reports health as good Married 2006 Spouse is here today for [employer] with veteran benefits     Diet (33)  Chol/hdl ratio 3; hdl 66 trig 125  A1c 6.1 in 2017 BS 114 12/2016; family hx of diabetes Try to eat as many veg as possible Eggs 3 times a week Does not eat bread and potatoes at the same time Try to watch and does not overeat 2 meals a day   Exercise Walking with spouse  Is a full time caregiver    There are no preventive care reminders to display for this patient.  Colonoscopy 01/2015 Mammogram 11/2015- November 2019 12/2015 bone density; no increase in fx wrist from one 3 years prior  Dr. Phineas Real is following   Cardiac Risk Factors include: advanced age (>68men, >43 women);hypertension;family history of premature cardiovascular disease;obesity (BMI >30kg/m2)Educated regarding shingrix      Objective:     Vitals: BP 120/60   Pulse 62   Ht 5' 6.5" (1.689 m)   Wt 210 lb (95.3 kg)   SpO2 98%   BMI 33.39 kg/m   Body mass index is 33.39 kg/m.  Advanced Directives 06/06/2017 02/07/2016 02/02/2015 12/29/2013  Does Patient Have a Medical Advance Directive? Yes Yes No No  Would patient like information on creating a medical advance directive? - - No - patient declined information No - patient declined information    Tobacco Social History   Tobacco Use  Smoking Status Never Smoker  Smokeless Tobacco Never Used     Counseling given: Yes   Clinical Intake:     Past Medical History:  Diagnosis Date  . Allergy, unspecified not elsewhere classified   . Anxiety   . Arthritis   . Cervical dysplasia    prior to hysterectomy  . GERD (gastroesophageal reflux disease)   . Headache(784.0)   . Hypertension   . IBS (irritable bowel syndrome)   . Osteopenia 12/2015   T score -1.1 FRAX 3.7%/0.4%  . Pelvic adhesions   . Pelvic  pain in female   . Sinus infection   . Thyroid disease    Parathyroid cyst   Past Surgical History:  Procedure Laterality Date  . ABDOMINAL HYSTERECTOMY  1983   TAH/BSO for pelvic pain and adhesions  . APPENDECTOMY  1994  . CATARACT EXTRACTION Bilateral 2013  . COLPOSCOPY    . FOOT SURGERY Bilateral   . PARATHYROID ADENOMA REMOVED  02/1980  . ROTATOR CUFF REPAIR     X2  . SHOULDER INJECTION  10/2011  . TONSILLECTOMY    . TUBAL LIGATION  1981  . VESICOVAGINAL FISTULA CLOSURE W/ TAH     Family History  Problem Relation Age of Onset  . Diabetes Mother   . Lung cancer Father   . Leukemia Brother   . Hypertension Maternal Grandmother   . Heart disease Maternal Grandmother   . Diabetes Maternal Grandmother   . Stroke Maternal Grandmother   . Diabetes Maternal Aunt   . Heart failure Paternal Grandmother    Social History   Socioeconomic History  . Marital status: Married    Spouse name: Laddie Aquas  . Number of children: 1  . Years of education: Not on file  . Highest education level: Not on file  Occupational History  . Occupation: retired    Fish farm manager: RETIRED  Social Needs  . Financial resource strain:  Not on file  . Food insecurity:    Worry: Not on file    Inability: Not on file  . Transportation needs:    Medical: Not on file    Non-medical: Not on file  Tobacco Use  . Smoking status: Never Smoker  . Smokeless tobacco: Never Used  Substance and Sexual Activity  . Alcohol use: Yes    Alcohol/week: 0.6 oz    Types: 1 Standard drinks or equivalent per week    Comment: Rare  . Drug use: No  . Sexual activity: Never    Birth control/protection: Surgical    Comment: 1st intercourse 77 yo-Fewer than 5 partners  Lifestyle  . Physical activity:    Days per week: Not on file    Minutes per session: Not on file  . Stress: Not on file  Relationships  . Social connections:    Talks on phone: Not on file    Gets together: Not on file    Attends religious  service: Not on file    Active member of club or organization: Not on file    Attends meetings of clubs or organizations: Not on file    Relationship status: Not on file  Other Topics Concern  . Not on file  Social History Narrative   Lives with husband.   Retired from working Surveyor, mining at [employer].   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    Outpatient Encounter Medications as of 06/06/2017  Medication Sig  . amLODipine-benazepril (LOTREL) 5-20 MG capsule TAKE 1 CAPSULE DAILY  . aspirin 81 MG tablet Take 81 mg by mouth daily.    Marland Kitchen gabapentin (NEURONTIN) 100 MG capsule Take one at night.  . metoprolol succinate (TOPROL-XL) 100 MG 24 hr tablet TAKE 1 TABLET DAILY WITH OR IMMEDIATELY FOLLOWING A MEAL  . Multiple Vitamin (MULTIVITAMIN) tablet Take 1 tablet by mouth daily.  . Omega-3 Fatty Acids (FISH OIL) 1200 MG CPDR Take 2 tablets by mouth daily.  Marland Kitchen OVER THE COUNTER MEDICATION Mood and stress 100 mg  . pantoprazole (PROTONIX) 40 MG tablet take 1 tablet by mouth once daily   No facility-administered encounter medications on file as of 06/06/2017.     Activities of Daily Living In your present state of health, do you have any difficulty performing the following activities: 06/06/2017  Hearing? N  Vision? N  Difficulty concentrating or making decisions? N  Walking or climbing stairs? N  Dressing or bathing? N  Doing errands, shopping? N  Preparing Food and eating ? N  Using the Toilet? N  In the past six months, have you accidently leaked urine? N  Comment when in a hurry   Do you have problems with loss of bowel control? N  Managing your Medications? N  Managing your Finances? N  Housekeeping or managing your Housekeeping? N  Some recent data might be hidden    Patient Care Team: Eulas Post, MD as PCP - General (Family Medicine)    Assessment:   This is a routine wellness examination for Margaret Mitchell.  Exercise Activities and Dietary  recommendations Current Exercise Habits: Home exercise routine, Type of exercise: walking, Intensity: Moderate  Goals    . Patient Stated     Travel again Help spouse Continue your group at [facility] and [place]  06/06/2017 02/07/2016 12/21/2015 12/01/2014 06/15/2013  Falls in the past year? No No No No No  Comment -  tripped quite a bit;  will slow down Emmi Telephone Survey: data to providers prior to load - -     Depression Screen PHQ 2/9 Scores 06/06/2017 01/15/2017 01/15/2017 02/07/2016  PHQ - 2 Score 0 1 1 0  PHQ- 9 Score - 1 - -     Cognitive Function MMSE - Mini Mental State Exam 06/06/2017 02/07/2016  Not completed: (No Data) (No Data)   Ad8 score reviewed for issues:  Issues making decisions:  Less interest in hobbies / activities:  Repeats questions, stories (family complaining):  Trouble using ordinary gadgets (microwave, computer, phone):  Forgets the month or year:   Mismanaging finances:   Remembering appts:  Daily problems with thinking and/or memory: Ad8 score is=0       Immunization History  Administered Date(s) Administered  . Influenza Split 11/13/2010, 11/12/2011  . Influenza Whole 10/27/2007, 11/08/2008, 12/08/2009  . Influenza, High Dose Seasonal PF 10/09/2013, 10/28/2014, 09/28/2015, 10/07/2016  . Influenza,inj,Quad PF,6+ Mos 09/30/2012  . Pneumococcal Conjugate-13 05/25/2013  . Pneumococcal Polysaccharide-23 05/08/2009  . Tdap 10/13/2012  . Zoster 01/15/2011      Screening Tests Health Maintenance  Topic Date Due  . INFLUENZA VACCINE  08/14/2017  . TETANUS/TDAP  10/14/2022  . COLONOSCOPY  02/01/2025  . DEXA SCAN  Completed  . PNA vac Low Risk Adult  Completed       Plan:      PCP Notes   Health Maintenance There are no preventive care reminders to display for this patient.   Educated regarding the shingrix   Abnormal Screens  none  Referrals  none  Patient concerns; Neuropathy;   Bilateral numbness BK and feet  (she has had nerve conduction test)   Did educate on possible pre-diabetes  Offered resources as the [place] does help pay for [facility] for Mr. Revuelta giving her respite, as well as HHA. 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 Phineas Real scheduled for Nov 2019  Will repeat dexa and follows mammogram        I have personally reviewed and noted the following in the patient's chart:   . Medical and social history . Use of alcohol, tobacco or illicit drugs  . Current medications and supplements . Functional ability and status . Nutritional status . Physical activity . Advanced directives . List of other physicians . Hospitalizations, surgeries, and ER visits in previous 12 months . Vitals . Screenings to include cognitive, depression, and falls . Referrals and appointments  In addition, I have reviewed and discussed with patient certain preventive protocols, quality metrics, and best practice recommendations. A written personalized care plan for preventive services as well as general preventive health recommendations were provided to patient.     QIONG,EXBMW, RN  06/06/2017  I have reviewed the documentation for the [facility] and [place] provided by the health coach and agree with their documentation. I was immediately available for any questions.  Pt has significant responsibility caring for her husband who has increasing needs secondary to his neurological disorder.  Eulas Post MD [facility]

## 2017-06-06 NOTE — Patient Instructions (Addendum)
Margaret Mitchell , Thank you for taking time to come for your Medicare Wellness Visit. I appreciate your ongoing commitment to your health goals. Please review the following plan we discussed and let me know if I can assist you in the future.    Shingrix is a vaccine for the prevention of Shingles in Adults 50 and older.  If you are on Medicare, the shingrix is covered under your Part D plan, so you will take both of the vaccines in the series at your pharmacy. Please check with your benefits regarding applicable copays or out of pocket expenses.  The Shingrix is given in 2 vaccines approx 8 weeks apart. You must receive the 2nd dose prior to 6 months from receipt of the first. Please have the pharmacist print out you Immunization  dates for our office records   These are the goals we discussed: Goals    . Patient Stated     Travel again Help spouse Continue your group at [facility] and [place]        This is a list of the screening recommended for you and due dates:  Health Maintenance  Topic Date Due  . Flu Shot  08/14/2017  . Tetanus Vaccine  10/14/2022  . Colon Cancer Screening  02/01/2025  . DEXA scan (bone density measurement)  Completed  . Pneumonia vaccines  Completed      Fall Prevention in the Home Falls can cause injuries. They can happen to people of all ages. There are many things you can do to make your home safe and to help prevent falls. What can I do on the outside of my home?  Regularly fix the edges of walkways and driveways and fix any cracks.  Remove anything that might make you trip as you walk through a door, such as a raised step or threshold.  Trim any bushes or trees on the path to your home.  Use bright outdoor lighting.  Clear any walking paths of anything that might make someone trip, such as rocks or tools.  Regularly check to see if handrails are loose or broken. Make sure that both sides of any steps have handrails.  Any raised decks  and porches should have guardrails on the edges.  Have any leaves, snow, or ice cleared regularly.  Use sand or salt on walking paths during winter.  Clean up any spills in your garage right away. This includes oil or grease spills. What can I do in the bathroom?  Use night lights.  Install grab bars by the toilet and in the tub and shower. Do not use towel bars as grab bars.  Use non-skid mats or decals in the tub or shower.  If you need to sit down in the shower, use a plastic, non-slip stool.  Keep the floor dry. Clean up any water that spills on the floor as soon as it happens.  Remove soap buildup in the tub or shower regularly.  Attach bath mats securely with double-sided non-slip rug tape.  Do not have throw rugs and other things on the floor that can make you trip. What can I do in the bedroom?  Use night lights.  Make sure that you have a light by your bed that is easy to reach.  Do not use any sheets or blankets that are too big for your bed. They should not hang down onto the floor.  Have a firm chair that has side arms. You can use this for  support while you get dressed.  Do not have throw rugs and other things on the floor that can make you trip. What can I do in the kitchen?  Clean up any spills right away.  Avoid walking on wet floors.  Keep items that you use a lot in easy-to-reach places.  If you need to reach something above you, use a strong step stool that has a grab bar.  Keep electrical cords out of the way.  Do not use floor polish or wax that makes floors slippery. If you must use wax, use non-skid floor wax.  Do not have throw rugs and other things on the floor that can make you trip. What can I do with my stairs?  Do not leave any items on the stairs.  Make sure that there are handrails on both sides of the stairs and use them. Fix handrails that are broken or loose. Make sure that handrails are as long as the stairways.  Check any  carpeting to make sure that it is firmly attached to the stairs. Fix any carpet that is loose or worn.  Avoid having throw rugs at the top or bottom of the stairs. If you do have throw rugs, attach them to the floor with carpet tape.  Make sure that you have a light switch at the top of the stairs and the bottom of the stairs. If you do not have them, ask someone to add them for you. What else can I do to help prevent falls?  Wear shoes that: ? Do not have high heels. ? Have rubber bottoms. ? Are comfortable and fit you well. ? Are closed at the toe. Do not wear sandals.  If you use a stepladder: ? Make sure that it is fully opened. Do not climb a closed stepladder. ? Make sure that both sides of the stepladder are locked into place. ? Ask someone to hold it for you, if possible.  Clearly mark and make sure that you can see: ? Any grab bars or handrails. ? First and last steps. ? Where the edge of each step is.  Use tools that help you move around (mobility aids) if they are needed. These include: ? Canes. ? Walkers. ? Scooters. ? Crutches.  Turn on the lights when you go into a dark area. Replace any light bulbs as soon as they burn out.  Set up your furniture so you have a clear path. Avoid moving your furniture around.  If any of your floors are uneven, fix them.  If there are any pets around you, be aware of where they are.  Review your medicines with your doctor. Some medicines can make you feel dizzy. This can increase your chance of falling. Ask your doctor what other things that you can do to help prevent falls. This information is not intended to replace advice given to you by your health care provider. Make sure you discuss any questions you have with your health care provider. Document Released: 10/27/2008 Document Revised: 06/08/2015 Document Reviewed: 02/04/2014 Elsevier Interactive Patient Education  2018 [place] Maintenance, Female Adopting a  healthy lifestyle and getting preventive care can go a long way to promote health and wellness. Talk with your health care provider about what schedule of regular examinations is right for you. This is a good chance for you to check in with your provider about disease prevention and staying healthy. In between checkups, there are plenty of things you can do  on your own. Experts have done a lot of research about which lifestyle changes and preventive measures are most likely to keep you healthy. Ask your health care provider for more information. Weight and diet Eat a healthy diet  Be sure to include plenty of vegetables, fruits, low-fat dairy products, and lean protein.  Do not eat a lot of foods high in solid fats, added sugars, or salt.  Get regular exercise. This is one of the most important things you can do for your health. ? Most adults should exercise for at least 150 minutes each week. The exercise should increase your heart rate and make you sweat (moderate-intensity exercise). ? Most adults should also do strengthening exercises at least twice a week. This is in addition to the moderate-intensity exercise.  Maintain a healthy weight  Body mass index (BMI) is a measurement that can be used to identify possible weight problems. It estimates body fat based on height and weight. Your health care provider can help determine your BMI and help you achieve or maintain a healthy weight.  For females 99 years of age and older: ? A BMI below 18.5 is considered underweight. ? A BMI of 18.5 to 24.9 is normal. ? A BMI of 25 to 29.9 is considered overweight. ? A BMI of 30 and above is considered obese.  Watch levels of cholesterol and blood lipids  You should start having your blood tested for lipids and cholesterol at 75 years of age, then have this test every 5 years.  You may need to have your cholesterol levels checked more often if: ? Your lipid or cholesterol levels are high. ? You are  older than 75 years of age. ? You are at high risk for heart disease.  Cancer screening Lung Cancer  Lung cancer screening is recommended for adults 22-32 years old who are at high risk for lung cancer because of a history of smoking.  A yearly low-dose CT scan of the lungs is recommended for people who: ? Currently smoke. ? Have quit within the past 15 years. ? Have at least a 30-pack-year history of smoking. A pack year is smoking an average of one pack of cigarettes a day for 1 year.  Yearly screening should continue until it has been 15 years since you quit.  Yearly screening should stop if you develop a health problem that would prevent you from having lung cancer treatment.  Breast Cancer  Practice breast self-awareness. This means understanding how your breasts normally appear and feel.  It also means doing regular breast self-exams. Let your health care provider know about any changes, no matter how small.  If you are in your 20s or 30s, you should have a clinical breast exam (CBE) by a health care provider every 1-3 years as part of a regular health exam.  If you are 48 or older, have a CBE every year. Also consider having a breast X-ray (mammogram) every year.  If you have a family history of breast cancer, talk to your health care provider about genetic screening.  If you are at high risk for breast cancer, talk to your health care provider about having an MRI and a mammogram every year.  Breast cancer gene (BRCA) assessment is recommended for women who have family members with BRCA-related cancers. BRCA-related cancers include: ? Breast. ? Ovarian. ? Tubal. ? Peritoneal cancers.  Results of the assessment will determine the need for genetic counseling and BRCA1 and BRCA2 testing.  Cervical Cancer  Your health care provider may recommend that you be screened regularly for cancer of the pelvic organs (ovaries, uterus, and vagina). This screening involves a pelvic  examination, including checking for microscopic changes to the surface of your cervix (Pap test). You may be encouraged to have this screening done every 3 years, beginning at age 12.  For women ages 81-65, health care providers may recommend pelvic exams and Pap testing every 3 years, or they may recommend the Pap and pelvic exam, combined with testing for human papilloma virus (HPV), every 5 years. Some types of HPV increase your risk of cervical cancer. Testing for HPV may also be done on women of any age with unclear Pap test results.  Other health care providers may not recommend any screening for nonpregnant women who are considered low risk for pelvic cancer and who do not have symptoms. Ask your health care provider if a screening pelvic exam is right for you.  If you have had past treatment for cervical cancer or a condition that could lead to cancer, you need Pap tests and screening for cancer for at least 20 years after your treatment. If Pap tests have been discontinued, your risk factors (such as having a new sexual partner) need to be reassessed to determine if screening should resume. Some women have medical problems that increase the chance of getting cervical cancer. In these cases, your health care provider may recommend more frequent screening and Pap tests.  Colorectal Cancer  This type of cancer can be detected and often prevented.  Routine colorectal cancer screening usually begins at 75 years of age and continues through 75 years of age.  Your health care provider may recommend screening at an earlier age if you have risk factors for colon cancer.  Your health care provider may also recommend using home test kits to check for hidden blood in the stool.  A small camera at the end of a tube can be used to examine your colon directly (sigmoidoscopy or colonoscopy). This is done to check for the earliest forms of colorectal cancer.  Routine screening usually begins at age  20.  Direct examination of the colon should be repeated every 5-10 years through 75 years of age. However, you may need to be screened more often if early forms of precancerous polyps or small growths are found.  Skin Cancer  Check your skin from head to toe regularly.  Tell your health care provider about any new moles or changes in moles, especially if there is a change in a mole's shape or color.  Also tell your health care provider if you have a mole that is larger than the size of a pencil eraser.  Always use sunscreen. Apply sunscreen liberally and repeatedly throughout the day.  Protect yourself by wearing long sleeves, pants, a wide-brimmed hat, and sunglasses whenever you are outside.  Heart disease, diabetes, and high blood pressure  High blood pressure causes heart disease and increases the risk of stroke. High blood pressure is more likely to develop in: ? People who have blood pressure in the high end of the normal range (130-139/85-89 mm Hg). ? People who are overweight or obese. ? People who are African American.  If you are 44-7 years of age, have your blood pressure checked every 3-5 years. If you are 54 years of age or older, have your blood pressure checked every year. You should have your blood pressure measured twice-once when you are at a hospital or clinic,  and once when you are not at a hospital or clinic. Record the average of the two measurements. To check your blood pressure when you are not at a hospital or clinic, you can use: ? An automated blood pressure machine at a pharmacy. ? A home blood pressure monitor.  If you are between 22 years and 74 years old, ask your health care provider if you should take aspirin to prevent strokes.  Have regular diabetes screenings. This involves taking a blood sample to check your fasting blood sugar level. ? If you are at a normal weight and have a low risk for diabetes, have this test once every three years after 75  years of age. ? If you are overweight and have a high risk for diabetes, consider being tested at a younger age or more often. Preventing infection Hepatitis B  If you have a higher risk for hepatitis B, you should be screened for this virus. You are considered at high risk for hepatitis B if: ? You were born in a country where hepatitis B is common. Ask your health care provider which countries are considered high risk. ? Your parents were born in a high-risk country, and you have not been immunized against hepatitis B (hepatitis B vaccine). ? You have HIV or AIDS. ? You use needles to inject street drugs. ? You live with someone who has hepatitis B. ? You have had sex with someone who has hepatitis B. ? You get hemodialysis treatment. ? You take certain medicines for conditions, including cancer, organ transplantation, and autoimmune conditions.  Hepatitis C  Blood testing is recommended for: ? Everyone born from [birth year] through 1963. ? Anyone with known risk factors for hepatitis C.  Sexually transmitted infections (STIs)  You should be screened for sexually transmitted infections (STIs) including gonorrhea and chlamydia if: ? You are sexually active and are younger than 75 years of age. ? You are older than 75 years of age and your health care provider tells you that you are at risk for this type of infection. ? Your sexual activity has changed since you were last screened and you are at an increased risk for chlamydia or gonorrhea. Ask your health care provider if you are at risk.  If you do not have HIV, but are at risk, it may be recommended that you take a prescription medicine daily to prevent HIV infection. This is called pre-exposure prophylaxis (PrEP). You are considered at risk if: ? You are sexually active and do not regularly use condoms or know the HIV status of your partner(s). ? You take drugs by injection. ? You are sexually active with a partner who has HIV.  Talk  with your health care provider about whether you are at high risk of being infected with HIV. If you choose to begin PrEP, you should first be tested for HIV. You should then be tested every 3 months for as long as you are taking PrEP. Pregnancy  If you are premenopausal and you may become pregnant, ask your health care provider about preconception counseling.  If you may become pregnant, take 400 to 800 micrograms (mcg) of folic acid every day.  If you want to prevent pregnancy, talk to your health care provider about birth control (contraception). Osteoporosis and menopause  Osteoporosis is a disease in which the bones lose minerals and strength with aging. This can result in serious bone fractures. Your risk for osteoporosis can be identified using a bone density scan.  If you are 92 years of age or older, or if you are at risk for osteoporosis and fractures, ask your health care provider if you should be screened.  Ask your health care provider whether you should take a calcium or vitamin D supplement to lower your risk for osteoporosis.  Menopause may have certain physical symptoms and risks.  Hormone replacement therapy may reduce some of these symptoms and risks. Talk to your health care provider about whether hormone replacement therapy is right for you. Follow these instructions at home:  Schedule regular health, dental, and eye exams.  Stay current with your immunizations.  Do not use any tobacco products including cigarettes, chewing tobacco, or electronic cigarettes.  If you are pregnant, do not drink alcohol.  If you are breastfeeding, limit how much and how often you drink alcohol.  Limit alcohol intake to no more than 1 drink per day for nonpregnant women. One drink equals 12 ounces of beer, 5 ounces of wine, or 1 ounces of hard liquor.  Do not use street drugs.  Do not share needles.  Ask your health care provider for help if you need support or information  about quitting drugs.  Tell your health care provider if you often feel depressed.  Tell your health care provider if you have ever been abused or do not feel safe at home. This information is not intended to replace advice given to you by your health care provider. Make sure you discuss any questions you have with your health care provider. Document Released: 07/16/2010 Document Revised: 06/08/2015 Document Reviewed: 10/04/2014 Elsevier Interactive Patient Education  [employer].

## 2017-07-04 ENCOUNTER — Ambulatory Visit (INDEPENDENT_AMBULATORY_CARE_PROVIDER_SITE_OTHER): Payer: Medicare Other | Admitting: Gynecology

## 2017-07-04 ENCOUNTER — Encounter: Payer: Self-pay | Admitting: Gynecology

## 2017-07-04 VITALS — BP 124/84

## 2017-07-04 DIAGNOSIS — G8929 Other chronic pain: Secondary | ICD-10-CM

## 2017-07-04 DIAGNOSIS — R1031 Right lower quadrant pain: Secondary | ICD-10-CM

## 2017-07-04 NOTE — Addendum Note (Signed)
Addended by: Nelva Nay on: 07/04/2017 12:09 PM   Modules accepted: Orders

## 2017-07-04 NOTE — Patient Instructions (Signed)
Follow-up with your gastroenterologist in reference to your pain.

## 2017-07-04 NOTE — Progress Notes (Signed)
Margaret Mitchell 01/14/1941 [id number]        77 y.o.  G1P0010 presents with a six-month history of right lower quadrant pain.  Notes an aching type discomfort on a daily basis that will last anywhere from all day to intermittently throughout the day.  No nausea vomiting diarrhea constipation.  No urinary symptoms such as frequency dysuria urgency low back pain fever or chills.  Saw her primary physician who recommended she follow-up with [name] after doing lumbar x-rays which were negative except for some degenerative changes.  Status post TAH/BSO in the past  Past medical history,surgical history, problem list, medications, allergies, family history and social history were all reviewed and documented in the EPIC chart.  Directed ROS with pertinent positives and negatives documented in the history of present illness/assessment and plan.  Exam: Kennon Portela assistant Vitals:   07/04/17 1045  BP: 124/84   General appearance:  Normal Spine straight without CVA tenderness Abdomen soft nontender without masses guarding rebound.  Area patient's pointing to his at McBurney's point underlying her right lower quadrant scar. Pelvic external BUS vagina with atrophic changes.  Bimanual without masses or tenderness.  Rectal exam normal.  Assessment/Plan:  77 y.o. G1P0010 with 49-month history of right lower quadrant pain on and off throughout the day.  No localizing symptoms such as GI or GU.  Status post TAH/BSO in the past.  We discussed unlikely GYN origin given her surgical history.  Adhesions as a possibility for her pain discussed although on a chronic aching nature would be unusual as I would expect more sharp pulling intermittent type pain.  Recommended patient follow-up with her gastroenterologist for further evaluation.  Most likely next step would be CT scan but I think it would be more appropriate for them to decide what testing is best.  Urine analysis today appears contaminated.  Does show  moderate bacteria but 6-10 squamous cells and few white cells.  We will follow-up on culture and treat if positive.  Greater than 50% of my time was spent in direct face to face counseling and coordination of care with the patient.     Dara Lords MD, 11:02 AM 07/04/2017

## 2017-07-05 ENCOUNTER — Other Ambulatory Visit: Payer: Self-pay | Admitting: Family Medicine

## 2017-07-06 LAB — URINALYSIS, COMPLETE W/RFL CULTURE
Bilirubin Urine: NEGATIVE
Glucose, UA: NEGATIVE
HYALINE CAST: NONE SEEN /LPF
Hgb urine dipstick: NEGATIVE
Leukocyte Esterase: NEGATIVE
Nitrites, Initial: NEGATIVE
RBC / HPF: NONE SEEN /HPF (ref 0–2)
Specific Gravity, Urine: 1.02 (ref 1.001–1.03)
pH: 5.5 (ref 5.0–8.0)

## 2017-07-06 LAB — URINE CULTURE
MICRO NUMBER:: 90749708
SPECIMEN QUALITY: ADEQUATE

## 2017-07-06 LAB — CULTURE INDICATED

## 2017-07-21 ENCOUNTER — Other Ambulatory Visit: Payer: Self-pay | Admitting: Family Medicine

## 2017-07-22 ENCOUNTER — Other Ambulatory Visit: Payer: Self-pay | Admitting: *Deleted

## 2017-07-22 MED ORDER — PANTOPRAZOLE SODIUM 40 MG PO TBEC: 40.0000 mg | DELAYED_RELEASE_TABLET | Freq: Every day | ORAL | 0 refills | Status: DC

## 2017-07-22 MED ORDER — GABAPENTIN 100 MG PO CAPS: ORAL_CAPSULE | ORAL | 0 refills | Status: DC

## 2017-09-11 DIAGNOSIS — H524 Presbyopia: Secondary | ICD-10-CM | POA: Diagnosis not present

## 2017-09-11 DIAGNOSIS — H33321 Round hole, right eye: Secondary | ICD-10-CM | POA: Diagnosis not present

## 2017-09-11 DIAGNOSIS — H26493 Other secondary cataract, bilateral: Secondary | ICD-10-CM | POA: Diagnosis not present

## 2017-09-11 DIAGNOSIS — H35033 Hypertensive retinopathy, bilateral: Secondary | ICD-10-CM | POA: Diagnosis not present

## 2017-09-11 DIAGNOSIS — H04123 Dry eye syndrome of bilateral lacrimal glands: Secondary | ICD-10-CM | POA: Diagnosis not present

## 2017-09-29 ENCOUNTER — Encounter: Payer: Self-pay | Admitting: Family Medicine

## 2017-10-01 ENCOUNTER — Ambulatory Visit (INDEPENDENT_AMBULATORY_CARE_PROVIDER_SITE_OTHER): Payer: Medicare Other | Admitting: Family Medicine

## 2017-10-01 ENCOUNTER — Other Ambulatory Visit: Payer: Self-pay

## 2017-10-01 ENCOUNTER — Encounter: Payer: Self-pay | Admitting: Family Medicine

## 2017-10-01 VITALS — BP 136/80 | HR 62 | Temp 98.3°F | Resp 16 | Ht 64.5 in | Wt 210.8 lb

## 2017-10-01 DIAGNOSIS — R4589 Other symptoms and signs involving emotional state: Secondary | ICD-10-CM

## 2017-10-01 DIAGNOSIS — F329 Major depressive disorder, single episode, unspecified: Secondary | ICD-10-CM

## 2017-10-01 DIAGNOSIS — F4323 Adjustment disorder with mixed anxiety and depressed mood: Secondary | ICD-10-CM

## 2017-10-01 DIAGNOSIS — I10 Essential (primary) hypertension: Secondary | ICD-10-CM | POA: Diagnosis not present

## 2017-10-01 MED ORDER — SERTRALINE HCL 50 MG PO TABS
50.0000 mg | ORAL_TABLET | Freq: Every day | ORAL | 5 refills | Status: DC
Start: 2017-10-01 — End: 2018-04-08

## 2017-10-01 NOTE — Progress Notes (Signed)
Subjective:     Patient ID: Margaret Mitchell, female   DOB: [birth year], 77 y.o.   MRN: [id number]  HPI Patient seen to discuss anxiety and depression issues. Her husband has multiple medical problems including Parkinson's disease and his had gradual decline in health. She's having increased anxiety and frustration dealing with his health issues. She gives several examples of how she is very frustrated. She states that when she asks him questions he does not give direct answers. He's had some definite increased confusion recently. He's been very slow with getting ready and they're frequently late to things like church because of this. He's had some difficulties with basic ADLs such as dressing difficulties. She feels her frustration tolerance is low..  She's had some increased depression symptoms. Occasional crying spells. No suicidal ideation. Intermittent disrupted sleep  She reads her Bible and is still actively engaged in church which has been very helpful.  Hypertension treated with Lotrel.  Compliant with therapy.  No headaches or dizziness.  Past Medical History:  Diagnosis Date  . Allergy, unspecified not elsewhere classified   . Anxiety   . Arthritis   . Cervical dysplasia    prior to hysterectomy  . GERD (gastroesophageal reflux disease)   . Headache(784.0)   . Hypertension   . IBS (irritable bowel syndrome)   . Osteopenia 12/2015   T score -1.1 FRAX 3.7%/0.4%  . Pelvic adhesions   . Pelvic pain in female   . Sinus infection   . Thyroid disease    Parathyroid cyst   Past Surgical History:  Procedure Laterality Date  . ABDOMINAL HYSTERECTOMY  1983   TAH/BSO for pelvic pain and adhesions  . APPENDECTOMY  1994  . CATARACT EXTRACTION Bilateral 2013  . COLPOSCOPY    . FOOT SURGERY Bilateral   . PARATHYROID ADENOMA REMOVED  02/1980  . ROTATOR CUFF REPAIR     X2  . SHOULDER INJECTION  10/2011  . TONSILLECTOMY    . TUBAL LIGATION  1981  . VESICOVAGINAL FISTULA CLOSURE W/  TAH      reports that she has never smoked. She has never used smokeless tobacco. She reports that she drinks about 1.0 standard drinks of alcohol per week. She reports that she does not use drugs. family history includes Diabetes in her maternal aunt, maternal grandmother, and mother; Heart disease in her maternal grandmother; Heart failure in her paternal grandmother; Hypertension in her maternal grandmother; Leukemia in her brother; Lung cancer in her father; Stroke in her maternal grandmother. Allergies  Allergen Reactions  . Other     Seldane  . Prednisone     REACTION: heart palpatations  In larger doses  . Seldane [Terfenadine]     SELDANE  . Sulfa Antibiotics     REACTION: pt unable to remember  . Sulfonamide Derivatives     REACTION: pt unable to remember  . Vioxx [Rofecoxib]     REACTION: mouth swelling (VIOXX)     Review of Systems  Constitutional: Negative for appetite change and unexpected weight change.  Cardiovascular: Negative for chest pain.  Psychiatric/Behavioral: Positive for dysphoric mood. Negative for agitation, confusion and suicidal ideas. The patient is nervous/anxious.        Objective:   Physical Exam  Constitutional: She is oriented to person, place, and time. She appears well-developed and well-nourished.  Cardiovascular: Normal rate and regular rhythm.  Pulmonary/Chest: Effort normal and breath sounds normal.  Musculoskeletal: She exhibits no edema.  Neurological: She is alert and oriented  to person, place, and time.  Psychiatric: She has a normal mood and affect. Her behavior is normal. Judgment and thought content normal.       Assessment:     #1 Situational anxiety with depressed mood.  #2 hypertension- stable and at goal.    Plan:     -we have recommended consideration for trial of sertraline 50 mg once daily -We discussed the importance of her getting adequate time for herself. She will very likely need to get additional care for him  down the road. -Reviewed potential side effects with sertraline. If she has any nausea, will start at 25 mgs daily and then titrate up after 3-4 days -Reassess in 3 weeks  Kristian Covey MD [facility]

## 2017-10-07 ENCOUNTER — Other Ambulatory Visit: Payer: Self-pay

## 2017-10-07 MED ORDER — GABAPENTIN 100 MG PO CAPS: ORAL_CAPSULE | ORAL | 0 refills | Status: DC

## 2017-10-17 ENCOUNTER — Ambulatory Visit (INDEPENDENT_AMBULATORY_CARE_PROVIDER_SITE_OTHER): Payer: Medicare Other

## 2017-10-17 DIAGNOSIS — Z23 Encounter for immunization: Secondary | ICD-10-CM

## 2017-10-20 DIAGNOSIS — M79671 Pain in right foot: Secondary | ICD-10-CM | POA: Diagnosis not present

## 2017-10-20 DIAGNOSIS — M25571 Pain in right ankle and joints of right foot: Secondary | ICD-10-CM | POA: Diagnosis not present

## 2017-10-20 DIAGNOSIS — M25471 Effusion, right ankle: Secondary | ICD-10-CM | POA: Diagnosis not present

## 2017-10-20 DIAGNOSIS — I10 Essential (primary) hypertension: Secondary | ICD-10-CM | POA: Diagnosis not present

## 2017-10-21 ENCOUNTER — Ambulatory Visit (INDEPENDENT_AMBULATORY_CARE_PROVIDER_SITE_OTHER): Payer: Medicare Other | Admitting: Family Medicine

## 2017-10-21 ENCOUNTER — Other Ambulatory Visit: Payer: Self-pay

## 2017-10-21 ENCOUNTER — Encounter: Payer: Self-pay | Admitting: Family Medicine

## 2017-10-21 DIAGNOSIS — F321 Major depressive disorder, single episode, moderate: Secondary | ICD-10-CM | POA: Insufficient documentation

## 2017-10-21 NOTE — Progress Notes (Signed)
Subjective:     Patient ID: Margaret Mitchell, female   DOB: 07-Feb-1940, 77 y.o.   MRN: [id number]  HPI Patient here to follow-up regarding depression issues.  Her husband has Parkinson's disease and she is very involved in his care.  She has frequent frustration in dealing with his care and has had some recent crying spells and loss of motivation.  No suicidal ideation.  Poor sleep.  We started sertraline and she is tolerated well with no side effects.  She feels her mood is greatly improved.  She is handling frustrations better overall.  She has very supportive church and has several people she talks to through her church.  Past Medical History:  Diagnosis Date  . Allergy, unspecified not elsewhere classified   . Anxiety   . Arthritis   . Cervical dysplasia    prior to hysterectomy  . GERD (gastroesophageal reflux disease)   . Headache(784.0)   . Hypertension   . IBS (irritable bowel syndrome)   . Osteopenia 12/2015   T score -1.1 FRAX 3.7%/0.4%  . Pelvic adhesions   . Pelvic pain in female   . Sinus infection   . Thyroid disease    Parathyroid cyst   Past Surgical History:  Procedure Laterality Date  . ABDOMINAL HYSTERECTOMY  1983   TAH/BSO for pelvic pain and adhesions  . APPENDECTOMY  1994  . CATARACT EXTRACTION Bilateral 2013  . COLPOSCOPY    . FOOT SURGERY Bilateral   . PARATHYROID ADENOMA REMOVED  02/1980  . ROTATOR CUFF REPAIR     X2  . SHOULDER INJECTION  10/2011  . TONSILLECTOMY    . TUBAL LIGATION  1981  . VESICOVAGINAL FISTULA CLOSURE W/ TAH      reports that she has never smoked. She has never used smokeless tobacco. She reports that she drinks about 1.0 standard drinks of alcohol per week. She reports that she does not use drugs. family history includes Diabetes in her maternal aunt, maternal grandmother, and mother; Heart disease in her maternal grandmother; Heart failure in her paternal grandmother; Hypertension in her maternal grandmother; Leukemia in her  brother; Lung cancer in her father; Stroke in her maternal grandmother. Allergies  Allergen Reactions  . Other     Seldane  . Prednisone     REACTION: heart palpatations  In larger doses  . Seldane [Terfenadine]     SELDANE  . Sulfa Antibiotics Other (See Comments)    REACTION: pt unable to remember REACTION: pt unable to remember  . Sulfasalazine Other (See Comments)    REACTION: pt unable to remember  . Sulfonamide Derivatives     REACTION: pt unable to remember  . Vioxx [Rofecoxib]     REACTION: mouth swelling (VIOXX)     Review of Systems  Constitutional: Negative for appetite change and unexpected weight change.  Psychiatric/Behavioral: Negative for agitation, confusion and suicidal ideas. The patient is not nervous/anxious.        Objective:   Physical Exam  Constitutional: She appears well-developed and well-nourished.  Cardiovascular: Normal rate and regular rhythm.  Pulmonary/Chest: Effort normal and breath sounds normal.  Psychiatric:  PHQ-9 score of 2       Assessment:     Major depressive episode of at least moderate severity greatly improved after initiation of sertraline    Plan:     -We recommend treatment with minimum of 6 to 9 months. -Offered counseling and at this point she wishes to wait -Recommend routine follow-up in about  6 months  Kristian Covey MD  Primary Care at [facility]

## 2017-10-21 NOTE — Patient Instructions (Signed)
Major Depressive Disorder, Adult Major depressive disorder (MDD) is a mental health condition. It may also be called clinical depression or unipolar depression. MDD usually causes feelings of sadness, hopelessness, or helplessness. MDD can also cause physical symptoms. It can interfere with work, school, relationships, and other everyday activities. MDD may be mild, moderate, or severe. It may occur once (single episode major depressive disorder) or it may occur multiple times (recurrent major depressive disorder). What are the causes? The exact cause of this condition is not known. MDD is most likely caused by a combination of things, which may include:  Genetic factors. These are traits that are passed along from parent to child.  Individual factors. Your personality, your behavior, and the way you handle your thoughts and feelings may contribute to MDD. This includes personality traits and behaviors learned from others.  Physical factors, such as: ? Differences in the part of your brain that controls emotion. This part of your brain may be different than it is in people who do not have MDD. ? Long-term (chronic) medical or psychiatric illnesses.  Social factors. Traumatic experiences or major life changes may play a role in the development of MDD.  What increases the risk? This condition is more likely to develop in women. The following factors may also make you more likely to develop MDD:  A family history of depression.  Troubled family relationships.  Abnormally low levels of certain brain chemicals.  Traumatic events in childhood, especially abuse or the loss of a parent.  Being under a lot of stress, or long-term stress, especially from upsetting life experiences or losses.  A history of: ? Chronic physical illness. ? Other mental health disorders. ? Substance abuse.  Poor living conditions.  Experiencing social exclusion or discrimination on a regular basis.  What are  the signs or symptoms? The main symptoms of MDD typically include:  Constant depressed or irritable mood.  Loss of interest in things and activities.  MDD symptoms may also include:  Sleeping or eating too much or too little.  Unexplained weight change.  Fatigue or low energy.  Feelings of worthlessness or guilt.  Difficulty thinking clearly or making decisions.  Thoughts of suicide or of harming others.  Physical agitation or weakness.  Isolation.  Severe cases of MDD may also occur with other symptoms, such as:  Delusions or hallucinations, in which you imagine things that are not real (psychotic depression).  Low-level depression that lasts at least a year (chronic depression or persistent depressive disorder).  Extreme sadness and hopelessness (melancholic depression).  Trouble speaking and moving (catatonic depression).  How is this diagnosed? This condition may be diagnosed based on:  Your symptoms.  Your medical history, including your mental health history. This may involve tests to evaluate your mental health. You may be asked questions about your lifestyle, including any drug and alcohol use, and how long you have had symptoms of MDD.  A physical exam.  Blood tests to rule out other conditions.  You must have a depressed mood and at least four other MDD symptoms most of the day, nearly every day in the same 2-week timeframe before your health care provider can confirm a diagnosis of MDD. How is this treated? This condition is usually treated by mental health professionals, such as psychologists, psychiatrists, and clinical social workers. You may need more than one type of treatment. Treatment may include:  Psychotherapy. This is also called talk therapy or counseling. Types of psychotherapy include: ? Cognitive behavioral   therapy (CBT). This type of therapy teaches you to recognize unhealthy feelings, thoughts, and behaviors, and replace them with  positive thoughts and actions. ? Interpersonal therapy (IPT). This helps you to improve the way you relate to and communicate with others. ? Family therapy. This treatment includes members of your family.  Medicine to treat anxiety and depression, or to help you control certain emotions and behaviors.  Lifestyle changes, such as: ? Limiting alcohol and drug use. ? Exercising regularly. ? Getting plenty of sleep. ? Making healthy eating choices. ? Spending more time outdoors.  Treatments involving stimulation of the brain can be used in situations with extremely severe symptoms, or when medicine or other therapies do not work over time. These treatments include electroconvulsive therapy, transcranial magnetic stimulation, and vagal nerve stimulation. Follow these instructions at home: Activity  Return to your normal activities as told by your health care provider.  Exercise regularly and spend time outdoors as told by your health care provider. General instructions  Take over-the-counter and prescription medicines only as told by your health care provider.  Do not drink alcohol. If you drink alcohol, limit your alcohol intake to no more than 1 drink a day for nonpregnant women and 2 drinks a day for men. One drink equals 12 oz of beer, 5 oz of wine, or 1 oz of hard liquor. Alcohol can affect any antidepressant medicines you are taking. Talk to your health care provider about your alcohol use.  Eat a healthy diet and get plenty of sleep.  Find activities that you enjoy doing, and make time to do them.  Consider joining a support group. Your health care provider may be able to recommend a support group.  Keep all follow-up visits as told by your health care provider. This is important. Where to find more information: National Alliance on Mental Illness  www.nami.org  U.S. National Institute of Mental Health  www.nimh.nih.gov  National Suicide Prevention  Lifeline  1-800-273-TALK (8255). This is free, 24-hour help.  Contact a health care provider if:  Your symptoms get worse.  You develop new symptoms. Get help right away if:  You self-harm.  You have serious thoughts about hurting yourself or others.  You see, hear, taste, smell, or feel things that are not present (hallucinate). This information is not intended to replace advice given to you by your health care provider. Make sure you discuss any questions you have with your health care provider. Document Released: 04/27/2012 Document Revised: 09/07/2015 Document Reviewed: 07/12/2015 Elsevier Interactive Patient Education  2018 Elsevier Inc.  

## 2017-10-23 ENCOUNTER — Other Ambulatory Visit: Payer: Self-pay | Admitting: Family Medicine

## 2017-10-30 ENCOUNTER — Other Ambulatory Visit: Payer: Self-pay | Admitting: Family Medicine

## 2017-11-25 DIAGNOSIS — M25511 Pain in right shoulder: Secondary | ICD-10-CM | POA: Diagnosis not present

## 2017-12-03 ENCOUNTER — Encounter: Payer: Self-pay | Admitting: Gynecology

## 2017-12-03 DIAGNOSIS — Z1231 Encounter for screening mammogram for malignant neoplasm of breast: Secondary | ICD-10-CM | POA: Diagnosis not present

## 2017-12-05 ENCOUNTER — Ambulatory Visit (INDEPENDENT_AMBULATORY_CARE_PROVIDER_SITE_OTHER): Payer: Medicare Other | Admitting: Gynecology

## 2017-12-05 ENCOUNTER — Encounter: Payer: Self-pay | Admitting: Gynecology

## 2017-12-05 VITALS — BP 130/84 | Ht 66.5 in | Wt 205.0 lb

## 2017-12-05 DIAGNOSIS — Z01419 Encounter for gynecological examination (general) (routine) without abnormal findings: Secondary | ICD-10-CM

## 2017-12-05 DIAGNOSIS — N952 Postmenopausal atrophic vaginitis: Secondary | ICD-10-CM

## 2017-12-05 NOTE — Patient Instructions (Signed)
Follow-up in 1 year for annual exam 

## 2017-12-05 NOTE — Progress Notes (Signed)
SANDEEP FORESTIER 1940/02/05 [id number]        77 y.o.  G1P0010 for breast and pelvic exam.  Without gynecologic complaints  Past medical history,surgical history, problem list, medications, allergies, family history and social history were all reviewed and documented as reviewed in the EPIC chart.  ROS:  Performed with pertinent positives and negatives included in the history, assessment and plan.   Additional significant findings : None   Exam: Kennon Portela assistant Vitals:   12/05/17 0850  BP: 130/84  Weight: 205 lb (93 kg)  Height: 5' 6.5" (1.689 m)   Body mass index is 32.59 kg/m.  General appearance:  Normal affect, orientation and appearance. Skin: Grossly normal HEENT: Without gross lesions.  No cervical or supraclavicular adenopathy. Thyroid normal.  Lungs:  Clear without wheezing, rales or rhonchi Cardiac: RR, without RMG Abdominal:  Soft, nontender, without masses, guarding, rebound, organomegaly or hernia Breasts:  Examined lying and sitting without masses, retractions, discharge or axillary adenopathy. Pelvic:  Ext, BUS, Vagina: With atrophic changes  Adnexa: Without masses or tenderness    Anus and perineum: Normal   Rectovaginal: Normal sphincter tone without palpated masses or tenderness.    Assessment/Plan:  77 y.o. G75P0010 female for breast and pelvic exam.   1. Postmenopausal.  Status post TAH/BSO 1983 for pelvic pain and adhesions.  No significant menopausal symptoms. 2. Osteopenia.  DEXA 12/2015 T score -1.1 FRAX 3.7% / 0.4%.  Plan repeat DEXA in another year or 2. 3. Mammography 11/2017.  Continue with annual mammography next year.  Breast exam normal today. 4. Colonoscopy 2017.  At their recommended interval. 5. Pap smear 2011.  No Pap smear done today.  History of dysplasia before hysterectomy with normal Pap smears since.  We both agree to stop screening per current screening guidelines based on age and hysterectomy history. 6. Health  maintenance.  No routine lab work done as patient does this elsewhere.  Follow-up 1 year, sooner as needed.   Dara Lords MD, 9:23 AM 12/05/2017

## 2017-12-16 ENCOUNTER — Other Ambulatory Visit: Payer: Self-pay

## 2017-12-16 ENCOUNTER — Ambulatory Visit (INDEPENDENT_AMBULATORY_CARE_PROVIDER_SITE_OTHER): Payer: Medicare Other | Admitting: Family Medicine

## 2017-12-16 ENCOUNTER — Encounter: Payer: Self-pay | Admitting: Family Medicine

## 2017-12-16 VITALS — BP 110/70 | HR 59 | Temp 98.0°F | Ht 66.5 in | Wt 203.3 lb

## 2017-12-16 DIAGNOSIS — Z780 Asymptomatic menopausal state: Secondary | ICD-10-CM

## 2017-12-16 DIAGNOSIS — R252 Cramp and spasm: Secondary | ICD-10-CM

## 2017-12-16 LAB — BASIC METABOLIC PANEL
BUN: 22 mg/dL (ref 6–23)
CHLORIDE: 104 meq/L (ref 96–112)
CO2: 28 meq/L (ref 19–32)
Calcium: 8.9 mg/dL (ref 8.4–10.5)
Creatinine, Ser: 1.06 mg/dL (ref 0.40–1.20)
GFR: 64.89 mL/min (ref >60.00)
GLUCOSE: 99 mg/dL (ref 70–99)
POTASSIUM: 4.5 meq/L (ref 3.5–5.1)
SODIUM: 139 meq/L (ref 135–145)

## 2017-12-16 LAB — MAGNESIUM: Magnesium: 2.4 mg/dL (ref 1.5–2.5)

## 2017-12-16 NOTE — Progress Notes (Signed)
Subjective:     Patient ID: Margaret Mitchell, female   DOB: October 26, 1940, 77 y.o.   MRN: [id number]  HPI Patient is seen with chief complaint of muscle cramps for the past month.  These have varied in location and include hands, thighs ,calves.  She states she drinks lots of water.  She has hypertension treated with amlodipine and benazepril.  No diuretic use.  She also takes multivitamin 1 daily.  She also started over-the-counter magnesium 250 mg daily but these have not helped.  Her cramps are almost daily.  She tried consuming some mustard without improvement.  No claudication symptoms.  Past Medical History:  Diagnosis Date  . Allergy, unspecified not elsewhere classified   . Anxiety   . Arthritis   . Cervical dysplasia    prior to hysterectomy  . GERD (gastroesophageal reflux disease)   . Headache(784.0)   . Hypertension   . IBS (irritable bowel syndrome)   . Osteopenia 12/2015   T score -1.1 FRAX 3.7%/0.4%  . Pelvic adhesions   . Pelvic pain in female   . Sinus infection   . Thyroid disease    Parathyroid cyst   Past Surgical History:  Procedure Laterality Date  . ABDOMINAL HYSTERECTOMY  1983   TAH/BSO for pelvic pain and adhesions  . APPENDECTOMY  1994  . CATARACT EXTRACTION Bilateral 2013  . COLPOSCOPY    . FOOT SURGERY Bilateral   . PARATHYROID ADENOMA REMOVED  02/1980  . ROTATOR CUFF REPAIR     X2  . SHOULDER INJECTION  10/2011  . TONSILLECTOMY    . TUBAL LIGATION  1981  . VESICOVAGINAL FISTULA CLOSURE W/ TAH      reports that she has never smoked. She has never used smokeless tobacco. She reports that she drinks alcohol. She reports that she does not use drugs. family history includes Diabetes in her maternal aunt, maternal grandmother, and mother; Heart disease in her maternal grandmother; Heart failure in her paternal grandmother; Hypertension in her maternal grandmother; Leukemia in her brother; Lung cancer in her father; Stroke in her maternal  grandmother. Allergies  Allergen Reactions  . Other     Seldane  . Prednisone     REACTION: heart palpatations  In larger doses  . Seldane [Terfenadine]     SELDANE  . Sulfa Antibiotics Other (See Comments)    REACTION: pt unable to remember REACTION: pt unable to remember  . Sulfasalazine Other (See Comments)    REACTION: pt unable to remember  . Sulfonamide Derivatives     REACTION: pt unable to remember  . Vioxx [Rofecoxib]     REACTION: mouth swelling (VIOXX)     Review of Systems  Constitutional: Negative for fatigue.  Eyes: Negative for visual disturbance.  Respiratory: Negative for cough, chest tightness, shortness of breath and wheezing.   Cardiovascular: Negative for chest pain, palpitations and leg swelling.  Neurological: Negative for dizziness, seizures, syncope, weakness, light-headedness and headaches.       Objective:   Physical Exam  Constitutional: She appears well-developed and well-nourished.  Cardiovascular: Normal rate and regular rhythm.  Pulmonary/Chest: Effort normal and breath sounds normal.  Musculoskeletal: She exhibits no edema.       Assessment:     Frequent muscle cramps.    Plan:     -Continue good hydration -Check basic metabolic panel and magnesium level -Consider vitamin D supplementation with 1000 to 2000 international unit vitamin D daily  Eulas Post MD [facility]

## 2017-12-16 NOTE — Patient Instructions (Signed)
Consider OTC Vit D 1,000 to 2,000 daily. Muscle Cramps and Spasms Muscle cramps and spasms occur when a muscle or muscles tighten and you have no control over this tightening (involuntary muscle contraction). They are a common problem and can develop in any muscle. The most common place is in the calf muscles of the leg. Muscle cramps and muscle spasms are both involuntary muscle contractions, but there are some differences between the two:  Muscle cramps are painful. They come and go and may last a few seconds to 15 minutes. Muscle cramps are often more forceful and last longer than muscle spasms.  Muscle spasms may or may not be painful. They may also last just a few seconds or much longer.  Certain medical conditions, such as diabetes or Parkinson disease, can make it more likely to develop cramps or spasms. However, cramps or spasms are usually not caused by a serious underlying problem. Common causes include:  Overexertion.  Overuse from repetitive motions, or doing the same thing over and over.  Remaining in a certain position for a long period of time.  Improper preparation, form, or technique while playing a sport or doing an activity.  Dehydration.  Injury.  Side effects of some medicines.  Abnormally low levels of the salts and ions in your blood (electrolytes), especially potassium and calcium. This could happen if you are taking water pills (diuretics) or if you are pregnant.  In many cases, the cause of muscle cramps or spasms is unknown. Follow these instructions at home:  Stay well hydrated. Drink enough fluid to keep your urine clear or pale yellow.  Try massaging, stretching, and relaxing the affected muscle.  If directed, apply heat to tight or tense muscles as often as told by your health care provider. Use the heat source that your health care provider recommends, such as a moist heat pack or a heating pad. ? Place a towel between your skin and the heat  source. ? Leave the heat on for 20-30 minutes. ? Remove the heat if your skin turns bright red. This is especially important if you are unable to feel pain, heat, or cold. You may have a greater risk of getting burned.  If directed, put ice on the affected area. This may help if you are sore or have pain after a cramp or spasm. ? Put ice in a plastic bag. ? Place a towel between your skin and the bag. ? Leavethe ice on for 20 minutes, 2-3 times a day.  Take over-the-counter and prescription medicines only as told by your health care provider.  Pay attention to any changes in your symptoms. Contact a health care provider if:  Your cramps or spasms get more severe or happen more often.  Your cramps or spasms do not improve over time. This information is not intended to replace advice given to you by your health care provider. Make sure you discuss any questions you have with your health care provider. Document Released: 06/22/2001 Document Revised: 02/01/2015 Document Reviewed: 10/04/2014 Elsevier Interactive Patient Education  2018 [employer].

## 2017-12-25 DIAGNOSIS — M2040 Other hammer toe(s) (acquired), unspecified foot: Secondary | ICD-10-CM | POA: Diagnosis not present

## 2018-01-13 ENCOUNTER — Encounter: Payer: Self-pay | Admitting: Family Medicine

## 2018-01-15 ENCOUNTER — Other Ambulatory Visit: Payer: Self-pay

## 2018-01-15 MED ORDER — PANTOPRAZOLE SODIUM 40 MG PO TBEC: 40.0000 mg | DELAYED_RELEASE_TABLET | Freq: Every day | ORAL | 0 refills | Status: DC

## 2018-01-24 ENCOUNTER — Encounter: Payer: Self-pay | Admitting: Family Medicine

## 2018-01-26 ENCOUNTER — Other Ambulatory Visit: Payer: Self-pay

## 2018-01-26 MED ORDER — METOPROLOL SUCCINATE ER 100 MG PO TB24: ORAL_TABLET | ORAL | 1 refills | Status: DC

## 2018-01-30 ENCOUNTER — Encounter: Payer: Self-pay | Admitting: Family Medicine

## 2018-02-02 ENCOUNTER — Other Ambulatory Visit: Payer: Self-pay | Admitting: Family Medicine

## 2018-02-16 DIAGNOSIS — H9311 Tinnitus, right ear: Secondary | ICD-10-CM | POA: Diagnosis not present

## 2018-02-16 DIAGNOSIS — R42 Dizziness and giddiness: Secondary | ICD-10-CM | POA: Diagnosis not present

## 2018-02-16 DIAGNOSIS — H903 Sensorineural hearing loss, bilateral: Secondary | ICD-10-CM | POA: Diagnosis not present

## 2018-02-19 ENCOUNTER — Other Ambulatory Visit: Payer: Self-pay | Admitting: Otolaryngology

## 2018-02-19 DIAGNOSIS — H9311 Tinnitus, right ear: Secondary | ICD-10-CM

## 2018-02-19 DIAGNOSIS — R42 Dizziness and giddiness: Secondary | ICD-10-CM

## 2018-02-24 ENCOUNTER — Ambulatory Visit (INDEPENDENT_AMBULATORY_CARE_PROVIDER_SITE_OTHER): Payer: Medicare Other | Admitting: Family Medicine

## 2018-02-24 ENCOUNTER — Other Ambulatory Visit: Payer: Self-pay

## 2018-02-24 ENCOUNTER — Encounter: Payer: Self-pay | Admitting: Family Medicine

## 2018-02-24 VITALS — BP 120/78 | HR 69 | Temp 98.8°F | Ht 66.5 in | Wt 197.3 lb

## 2018-02-24 DIAGNOSIS — J069 Acute upper respiratory infection, unspecified: Secondary | ICD-10-CM

## 2018-02-24 DIAGNOSIS — B9789 Other viral agents as the cause of diseases classified elsewhere: Secondary | ICD-10-CM

## 2018-02-24 MED ORDER — HYDROCODONE-HOMATROPINE 5-1.5 MG/5ML PO SYRP: 5.0000 mL | ORAL_SOLUTION | Freq: Four times a day (QID) | ORAL | 0 refills | Status: AC | PRN

## 2018-02-24 NOTE — Patient Instructions (Signed)
Cough, Adult  Coughing is a reflex that clears your throat and your airways. Coughing helps to heal and protect your lungs. It is normal to cough occasionally, but a cough that happens with other symptoms or lasts a long time may be a sign of a condition that needs treatment. A cough may last only 2-3 weeks (acute), or it may last longer than 8 weeks (chronic). What are the causes? Coughing is commonly caused by:  Breathing in substances that irritate your lungs.  A viral or bacterial respiratory infection.  Allergies.  Asthma.  Postnasal drip.  Smoking.  Acid backing up from the stomach into the esophagus (gastroesophageal reflux).  Certain medicines.  Chronic lung problems, including COPD (or rarely, lung cancer).  Other medical conditions such as heart failure. Follow these instructions at home: Pay attention to any changes in your symptoms. Take these actions to help with your discomfort:  Take medicines only as told by your health care provider. ? If you were prescribed an antibiotic medicine, take it as told by your health care provider. Do not stop taking the antibiotic even if you start to feel better. ? Talk with your health care provider before you take a cough suppressant medicine.  Drink enough fluid to keep your urine clear or pale yellow.  If the air is dry, use a cold steam vaporizer or humidifier in your bedroom or your home to help loosen secretions.  Avoid anything that causes you to cough at work or at home.  If your cough is worse at night, try sleeping in a semi-upright position.  Avoid cigarette smoke. If you smoke, quit smoking. If you need help quitting, ask your health care provider.  Avoid caffeine.  Avoid alcohol.  Rest as needed. Contact a health care provider if:  You have new symptoms.  You cough up pus.  Your cough does not get better after 2-3 weeks, or your cough gets worse.  You cannot control your cough with suppressant  medicines and you are losing sleep.  You develop pain that is getting worse or pain that is not controlled with pain medicines.  You have a fever.  You have unexplained weight loss.  You have night sweats. Get help right away if:  You cough up blood.  You have difficulty breathing.  Your heartbeat is very fast. This information is not intended to replace advice given to you by your health care provider. Make sure you discuss any questions you have with your health care provider. Document Released: 06/29/2010 Document Revised: 06/08/2015 Document Reviewed: 03/09/2014 Elsevier Interactive Patient Education  2019 Elsevier Inc.  

## 2018-02-24 NOTE — Progress Notes (Signed)
Subjective:     Patient ID: Margaret Mitchell, female   DOB: [birth year], 78 y.o.   MRN: [id number]  HPI Patient seen for acute visit with onset this past Saturday of sore throat, nasal congestion, cough, fatigue.  Cough has been mostly nonproductive.  Severe at night.  Not relieved with Delsym.  No fever.  No nausea or vomiting.  She is having to care for her husband who has progressive supranuclear palsy.  She has some myalgias.  Is keeping down fluids.  She had some chills but again no documented fever  Past Medical History:  Diagnosis Date  . Allergy, unspecified not elsewhere classified   . Anxiety   . Arthritis   . Cervical dysplasia    prior to hysterectomy  . GERD (gastroesophageal reflux disease)   . Headache(784.0)   . Hypertension   . IBS (irritable bowel syndrome)   . Osteopenia 12/2015   T score -1.1 FRAX 3.7%/0.4%  . Pelvic adhesions   . Pelvic pain in female   . Sinus infection   . Thyroid disease    Parathyroid cyst   Past Surgical History:  Procedure Laterality Date  . ABDOMINAL HYSTERECTOMY  1983   TAH/BSO for pelvic pain and adhesions  . APPENDECTOMY  1994  . CATARACT EXTRACTION Bilateral 2013  . COLPOSCOPY    . FOOT SURGERY Bilateral   . PARATHYROID ADENOMA REMOVED  02/1980  . ROTATOR CUFF REPAIR     X2  . SHOULDER INJECTION  10/2011  . TONSILLECTOMY    . TUBAL LIGATION  1981  . VESICOVAGINAL FISTULA CLOSURE W/ TAH      reports that she has never smoked. She has never used smokeless tobacco. She reports current alcohol use. She reports that she does not use drugs. family history includes Diabetes in her maternal aunt, maternal grandmother, and mother; Heart disease in her maternal grandmother; Heart failure in her paternal grandmother; Hypertension in her maternal grandmother; Leukemia in her brother; Lung cancer in her father; Stroke in her maternal grandmother. Allergies  Allergen Reactions  . Other     Seldane  . Prednisone     REACTION: heart  palpatations  In larger doses  . Seldane [Terfenadine]     SELDANE  . Sulfa Antibiotics Other (See Comments)    REACTION: pt unable to remember REACTION: pt unable to remember  . Sulfasalazine Other (See Comments)    REACTION: pt unable to remember  . Sulfonamide Derivatives     REACTION: pt unable to remember  . Vioxx [Rofecoxib]     REACTION: mouth swelling (VIOXX)     Review of Systems  Constitutional: Positive for chills and fatigue. Negative for fever.  HENT: Positive for congestion and sore throat.   Respiratory: Positive for cough. Negative for shortness of breath.        Objective:   Physical Exam Constitutional:      Appearance: She is well-developed.  HENT:     Right Ear: Tympanic membrane normal.     Left Ear: Tympanic membrane normal.     Mouth/Throat:     Pharynx: No oropharyngeal exudate or posterior oropharyngeal erythema.  Neck:     Musculoskeletal: Neck supple.  Cardiovascular:     Rate and Rhythm: Normal rate and regular rhythm.  Pulmonary:     Effort: Pulmonary effort is normal.     Breath sounds: Normal breath sounds. No wheezing or rales.  Lymphadenopathy:     Cervical: No cervical adenopathy.  Neurological:  Mental Status: She is alert.        Assessment:     Probable viral URI with cough    Plan:     -Hycodan cough syrup 1 teaspoon nightly for severe cough -Plenty of fluids and rest -Follow-up for any fever or worsening symptoms  Kristian Covey MD [facility]

## 2018-03-08 ENCOUNTER — Ambulatory Visit
Admission: RE | Admit: 2018-03-08 | Discharge: 2018-03-08 | Disposition: A | Discharge status: Home / Self Care | Payer: Medicare Other | Source: Ambulatory Visit | Attending: Otolaryngology | Admitting: Otolaryngology

## 2018-03-08 DIAGNOSIS — R42 Dizziness and giddiness: Secondary | ICD-10-CM

## 2018-03-08 DIAGNOSIS — H9311 Tinnitus, right ear: Secondary | ICD-10-CM | POA: Diagnosis not present

## 2018-03-08 MED ORDER — GADOBENATE DIMEGLUMINE 529 MG/ML IV SOLN
9.0000 mL | Freq: Once | INTRAVENOUS | Status: AC | PRN
  Administered 2018-03-08: 9 mL via INTRAVENOUS

## 2018-04-08 ENCOUNTER — Other Ambulatory Visit: Payer: Self-pay | Admitting: Family Medicine

## 2018-05-26 ENCOUNTER — Other Ambulatory Visit: Payer: Self-pay | Admitting: Family Medicine

## 2018-05-28 DIAGNOSIS — S46012A Strain of muscle(s) and tendon(s) of the rotator cuff of left shoulder, initial encounter: Secondary | ICD-10-CM | POA: Diagnosis not present

## 2018-06-10 ENCOUNTER — Other Ambulatory Visit: Payer: Self-pay | Admitting: Family Medicine

## 2018-06-11 ENCOUNTER — Other Ambulatory Visit: Payer: Self-pay

## 2018-06-11 ENCOUNTER — Ambulatory Visit (INDEPENDENT_AMBULATORY_CARE_PROVIDER_SITE_OTHER): Payer: Medicare Other | Admitting: Family Medicine

## 2018-06-11 DIAGNOSIS — R634 Abnormal weight loss: Secondary | ICD-10-CM

## 2018-06-11 DIAGNOSIS — R61 Generalized hyperhidrosis: Secondary | ICD-10-CM

## 2018-06-11 DIAGNOSIS — F321 Major depressive disorder, single episode, moderate: Secondary | ICD-10-CM

## 2018-06-11 DIAGNOSIS — I10 Essential (primary) hypertension: Secondary | ICD-10-CM | POA: Diagnosis not present

## 2018-06-11 MED ORDER — SERTRALINE HCL 50 MG PO TABS: ORAL_TABLET | ORAL | 3 refills | Status: DC

## 2018-06-11 NOTE — Telephone Encounter (Signed)
She is having some side effects from this, Doxy today at 9:15am

## 2018-06-11 NOTE — Progress Notes (Signed)
Patient ID: Margaret Mitchell, female   DOB: 18-Oct-1940, 78 y.o.   MRN: [id number]  This visit type was conducted due to national recommendations for restrictions regarding the COVID-19 pandemic in an effort to limit this patient's exposure and mitigate transmission in our community.   Virtual Visit via Video Note  I connected with Margaret Mitchell on 06/11/18 at  9:15 AM EDT by a video enabled telemedicine application and verified that I am speaking with the correct person using two identifiers.  Location patient: home Location provider:work or home office Persons participating in the virtual visit: patient, provider  I discussed the limitations of evaluation and management by telemedicine and the availability of in person appointments. The patient expressed understanding and agreed to proceed.   HPI:  Patient had called needing refills of sertraline.  Her husband has progressive neurologic syndrome and she is had her hands full dealing with him.  She feels her mood and anxiety are fairly stable.  She takes sertraline 50 mg daily.  Does feel like she needs to remain on this for the current time.  No suicidal ideation.  She does relate some sweats at night and also during the day for the past several months.  She had some progressive weight loss.  She was 211 pounds here last October and then 203 pounds in December and 197 pounds in February and states her current weight is 189 pounds.  Her appetite is fair to slightly diminished.  She denies any headaches, chest pains, dyspnea, abdominal pain, change in stool habits.  Occasional palpitations.  No consistent diarrhea symptoms.  Wt Readings from Last 3 Encounters:  02/24/18 197 lb 4.8 oz (89.5 kg)  12/16/17 203 lb 4.8 oz (92.2 kg)  12/05/17 205 lb (93 kg)      ROS: See pertinent positives and negatives per HPI.  Past Medical History:  Diagnosis Date  . Allergy, unspecified not elsewhere classified   . Anxiety   . Arthritis   . Cervical  dysplasia    prior to hysterectomy  . GERD (gastroesophageal reflux disease)   . Headache(784.0)   . Hypertension   . IBS (irritable bowel syndrome)   . Osteopenia 12/2015   T score -1.1 FRAX 3.7%/0.4%  . Pelvic adhesions   . Pelvic pain in female   . Sinus infection   . Thyroid disease    Parathyroid cyst    Past Surgical History:  Procedure Laterality Date  . ABDOMINAL HYSTERECTOMY  1983   TAH/BSO for pelvic pain and adhesions  . APPENDECTOMY  1994  . CATARACT EXTRACTION Bilateral 2013  . COLPOSCOPY    . FOOT SURGERY Bilateral   . PARATHYROID ADENOMA REMOVED  02/1980  . ROTATOR CUFF REPAIR     X2  . SHOULDER INJECTION  10/2011  . TONSILLECTOMY    . TUBAL LIGATION  1981  . VESICOVAGINAL FISTULA CLOSURE W/ TAH      Family History  Problem Relation Age of Onset  . Diabetes Mother   . Lung cancer Father   . Leukemia Brother   . Hypertension Maternal Grandmother   . Heart disease Maternal Grandmother   . Diabetes Maternal Grandmother   . Stroke Maternal Grandmother   . Diabetes Maternal Aunt   . Heart failure Paternal Grandmother     SOCIAL HX: Non-smoker.  Currently caring for her debilitated husband   Current Outpatient Medications:  .  amLODipine-benazepril (LOTREL) 5-20 MG capsule, TAKE 1 CAPSULE DAILY, Disp: 90 capsule, Rfl: 4 .  aspirin  81 MG tablet, Take 81 mg by mouth daily.  , Disp: , Rfl:  .  gabapentin (NEURONTIN) 100 MG capsule, TAKE 1 CAPSULE AT NIGHT, Disp: 90 capsule, Rfl: 3 .  metoprolol succinate (TOPROL XL) 100 MG 24 hr tablet, TAKE 1 TABLET DAILY WITH OR IMMEDIATELY FOLLOWING A MEAL, Disp: 90 tablet, Rfl: 1 .  Multiple Vitamin (MULTIVITAMIN) tablet, Take 1 tablet by mouth daily., Disp: , Rfl:  .  pantoprazole (PROTONIX) 40 MG tablet, TAKE 1 TABLET DAILY, Disp: 90 tablet, Rfl: 1 .  sertraline (ZOLOFT) 50 MG tablet, TAKE 1 TABLET(50 MG) BY MOUTH DAILY, Disp: 30 tablet, Rfl: 1  EXAM:  VITALS per patient if applicable:  GENERAL: alert, oriented,  appears well and in no acute distress  HEENT: atraumatic, conjunttiva clear, no obvious abnormalities on inspection of external nose and ears  NECK: normal movements of the head and neck  LUNGS: on inspection no signs of respiratory distress, breathing rate appears normal, no obvious gross SOB, gasping or wheezing  CV: no obvious cyanosis  MS: moves all visible extremities without noticeable abnormality  PSYCH/NEURO: pleasant and cooperative, no obvious depression or anxiety, speech and thought processing grossly intact  ASSESSMENT AND PLAN:  Discussed the following assessment and plan:  #1 hypertension-stable -Continue current medications  #2 history of major depressive episode currently stable on sertraline -Refilled sertraline for 1 year  #3 recent reported sweats and weight loss which has been documented of about 20 pounds over the past several months -Patient will come in next Tuesday for CBC, comprehensive metabolic panel, sed rate, TSH, free T4     I discussed the assessment and treatment plan with the patient. The patient was provided an opportunity to ask questions and all were answered. The patient agreed with the plan and demonstrated an understanding of the instructions.   The patient was advised to call back or seek an in-person evaluation if the symptoms worsen or if the condition fails to improve as anticipated.   Evelena Peat, MD

## 2018-06-16 ENCOUNTER — Other Ambulatory Visit (INDEPENDENT_AMBULATORY_CARE_PROVIDER_SITE_OTHER): Payer: Medicare Other

## 2018-06-16 ENCOUNTER — Other Ambulatory Visit: Payer: Self-pay

## 2018-06-16 DIAGNOSIS — R61 Generalized hyperhidrosis: Secondary | ICD-10-CM | POA: Diagnosis not present

## 2018-06-16 DIAGNOSIS — R634 Abnormal weight loss: Secondary | ICD-10-CM

## 2018-06-16 DIAGNOSIS — I10 Essential (primary) hypertension: Secondary | ICD-10-CM | POA: Diagnosis not present

## 2018-06-16 LAB — CBC WITH DIFFERENTIAL/PLATELET
Basophils Absolute: 0 10*3/uL (ref 0.0–0.1)
Basophils Relative: 0.6 % (ref 0.0–3.0)
Eosinophils Absolute: 0.1 10*3/uL (ref 0.0–0.7)
Eosinophils Relative: 1.3 % (ref 0.0–5.0)
HCT: 39.9 % (ref 36.0–46.0)
Hemoglobin: 13.3 g/dL (ref 12.0–15.0)
Lymphocytes Relative: 35.9 % (ref 12.0–46.0)
Lymphs Abs: 3 10*3/uL (ref 0.7–4.0)
MCHC: 33.4 g/dL (ref 30.0–36.0)
MCV: 95 fl (ref 78.0–100.0)
Monocytes Absolute: 0.8 10*3/uL (ref 0.1–1.0)
Monocytes Relative: 10.2 % (ref 3.0–12.0)
Neutro Abs: 4.3 10*3/uL (ref 1.4–7.7)
Neutrophils Relative %: 52 % (ref 43.0–77.0)
Platelets: 274 10*3/uL (ref 150.0–400.0)
RBC: 4.2 Mil/uL (ref 3.87–5.11)
RDW: 13.4 % (ref 11.5–15.5)
WBC: 8.3 10*3/uL (ref 4.0–10.5)

## 2018-06-16 LAB — COMPREHENSIVE METABOLIC PANEL
ALT: 19 U/L (ref 0–35)
AST: 21 U/L (ref 0–37)
Albumin: 4.2 g/dL (ref 3.5–5.2)
Alkaline Phosphatase: 77 U/L (ref 39–117)
BUN: 16 mg/dL (ref 6–23)
CO2: 28 mEq/L (ref 19–32)
Calcium: 9.1 mg/dL (ref 8.4–10.5)
Chloride: 103 mEq/L (ref 96–112)
Creatinine, Ser: 1.02 mg/dL (ref 0.40–1.20)
GFR: 63.74 mL/min (ref >60.00)
Glucose, Bld: 76 mg/dL (ref 70–99)
Potassium: 4.8 mEq/L (ref 3.5–5.1)
Sodium: 140 mEq/L (ref 135–145)
Total Bilirubin: 0.6 mg/dL (ref 0.2–1.2)
Total Protein: 7 g/dL (ref 6.0–8.3)

## 2018-06-16 LAB — T4, FREE: Free T4: 0.79 ng/dL (ref 0.60–1.60)

## 2018-06-16 LAB — TSH: TSH: 3.43 u[IU]/mL (ref 0.35–4.50)

## 2018-06-16 LAB — SEDIMENTATION RATE: Sed Rate: 30 mm/hr (ref 0–30)

## 2018-07-02 ENCOUNTER — Ambulatory Visit: Payer: Medicare Other | Admitting: Family Medicine

## 2018-07-09 ENCOUNTER — Ambulatory Visit (INDEPENDENT_AMBULATORY_CARE_PROVIDER_SITE_OTHER): Payer: Medicare Other | Admitting: Family Medicine

## 2018-07-09 ENCOUNTER — Other Ambulatory Visit: Payer: Self-pay

## 2018-07-09 ENCOUNTER — Encounter: Payer: Self-pay | Admitting: Family Medicine

## 2018-07-09 VITALS — BP 120/70 | HR 60 | Temp 98.0°F | Wt 193.2 lb

## 2018-07-09 DIAGNOSIS — R634 Abnormal weight loss: Secondary | ICD-10-CM | POA: Diagnosis not present

## 2018-07-09 DIAGNOSIS — G629 Polyneuropathy, unspecified: Secondary | ICD-10-CM | POA: Diagnosis not present

## 2018-07-09 DIAGNOSIS — I10 Essential (primary) hypertension: Secondary | ICD-10-CM | POA: Diagnosis not present

## 2018-07-09 DIAGNOSIS — R5383 Other fatigue: Secondary | ICD-10-CM

## 2018-07-09 NOTE — Progress Notes (Signed)
Subjective:     Patient ID: Margaret Mitchell, female   DOB: 1940/10/17, 78 y.o.   MRN: [id number]  HPI Patient had requesting coming into follow-up regarding recent labs and recent weight loss.  Chronic problems include history of hypertension, GERD, irritable bowel syndrome, multinodular thyroid, osteopenia.  She has had some increasing neuropathy symptoms at night and probably restless leg type symptoms.  She is taking gabapentin 100 mg and recently upped this herself to 200 mg just last night.  She states her appetite is fairly good.  She thinks a lot of her weight loss is due to stress from taking care for her husband.  He has progressive supranuclear palsy and is declining both physically and cognitively fairly rapidly.  She is struggling with whether to place him into a care facility.  She is getting some help but has pretty much a 24/7 job taking care of him.  Her weight loss has been modest.  She was 197 pounds back in February and down to 193 today.  She has had some nonspecific sweats at night.  No cough.  No headache.  No abdominal pain.  No change in bowel habits.  Several recent labs.  Sed rate 30.  Thyroid functions normal.  CBC and comprehensive metabolic panel unremarkable  Patient does have history of depression and takes Zoloft for that.  She feels that is helping.  Wt Readings from Last 3 Encounters:  07/09/18 193 lb 3.2 oz (87.6 kg)  02/24/18 197 lb 4.8 oz (89.5 kg)  12/16/17 203 lb 4.8 oz (92.2 kg)     Past Medical History:  Diagnosis Date  . Allergy, unspecified not elsewhere classified   . Anxiety   . Arthritis   . Cervical dysplasia    prior to hysterectomy  . GERD (gastroesophageal reflux disease)   . Headache(784.0)   . Hypertension   . IBS (irritable bowel syndrome)   . Osteopenia 12/2015   T score -1.1 FRAX 3.7%/0.4%  . Pelvic adhesions   . Pelvic pain in female   . Sinus infection   . Thyroid disease    Parathyroid cyst   Past Surgical History:   Procedure Laterality Date  . ABDOMINAL HYSTERECTOMY  1983   TAH/BSO for pelvic pain and adhesions  . APPENDECTOMY  1994  . CATARACT EXTRACTION Bilateral 2013  . COLPOSCOPY    . FOOT SURGERY Bilateral   . PARATHYROID ADENOMA REMOVED  02/1980  . ROTATOR CUFF REPAIR     X2  . SHOULDER INJECTION  10/2011  . TONSILLECTOMY    . TUBAL LIGATION  1981  . VESICOVAGINAL FISTULA CLOSURE W/ TAH      reports that she has never smoked. She has never used smokeless tobacco. She reports current alcohol use. She reports that she does not use drugs. family history includes Diabetes in her maternal aunt, maternal grandmother, and mother; Heart disease in her maternal grandmother; Heart failure in her paternal grandmother; Hypertension in her maternal grandmother; Leukemia in her brother; Lung cancer in her father; Stroke in her maternal grandmother. Allergies  Allergen Reactions  . Other     Seldane  . Prednisone     REACTION: heart palpatations  In larger doses  . Seldane [Terfenadine]     SELDANE  . Sulfa Antibiotics Other (See Comments)    REACTION: pt unable to remember REACTION: pt unable to remember  . Sulfasalazine Other (See Comments)    REACTION: pt unable to remember  . Sulfonamide Derivatives  REACTION: pt unable to remember  . Vioxx [Rofecoxib]     REACTION: mouth swelling (VIOXX)     Review of Systems  Constitutional: Positive for fatigue. Negative for chills and fever.  Eyes: Negative for visual disturbance.  Respiratory: Negative for cough, chest tightness, shortness of breath and wheezing.   Cardiovascular: Negative for chest pain, palpitations and leg swelling.  Gastrointestinal: Negative for abdominal pain, diarrhea, nausea and vomiting.  Genitourinary: Negative for dysuria.  Neurological: Negative for dizziness, seizures, syncope, weakness, light-headedness and headaches.       Objective:   Physical Exam Constitutional:      Appearance: She is well-developed.   Eyes:     Pupils: Pupils are equal, round, and reactive to light.  Neck:     Musculoskeletal: Neck supple.     Thyroid: No thyromegaly.     Vascular: No JVD.  Cardiovascular:     Rate and Rhythm: Normal rate and regular rhythm.     Heart sounds: No gallop.   Pulmonary:     Effort: Pulmonary effort is normal. No respiratory distress.     Breath sounds: Normal breath sounds. No wheezing or rales.  Musculoskeletal:     Right lower leg: No edema.     Left lower leg: No edema.  Neurological:     Mental Status: She is alert.        Assessment:     #1 weight loss.  This has been relatively modest.  Recent labs reassuring.  Suspect a lot of this is related to stress with caring for her husband who has multiple needs  #2 hypertension stable and at goal  #3 peripheral neuropathy.  Increased nighttime symptoms.    Plan:     -Increase nighttime dose of gabapentin to 200 mg nightly -We talked about stress management.  We have offered counseling if she is interested. -She is considering placement options for her husband which I think will take her stress levels down tremendously  Kristian Covey MD [facility] at [facility]

## 2018-07-21 ENCOUNTER — Encounter: Payer: Self-pay | Admitting: Family Medicine

## 2018-07-22 ENCOUNTER — Other Ambulatory Visit: Payer: Self-pay

## 2018-07-22 MED ORDER — GABAPENTIN 100 MG PO CAPS: 200.0000 mg | ORAL_CAPSULE | Freq: Every day | ORAL | 2 refills | Status: DC

## 2018-07-31 ENCOUNTER — Other Ambulatory Visit: Payer: Self-pay | Admitting: Family Medicine

## 2018-08-05 ENCOUNTER — Encounter: Payer: Self-pay | Admitting: Family Medicine

## 2018-08-17 ENCOUNTER — Other Ambulatory Visit: Payer: Self-pay

## 2018-09-02 ENCOUNTER — Telehealth (INDEPENDENT_AMBULATORY_CARE_PROVIDER_SITE_OTHER): Payer: Medicare Other | Admitting: Family Medicine

## 2018-09-02 ENCOUNTER — Encounter: Payer: Self-pay | Admitting: Family Medicine

## 2018-09-02 ENCOUNTER — Other Ambulatory Visit: Payer: Self-pay

## 2018-09-02 DIAGNOSIS — F419 Anxiety disorder, unspecified: Secondary | ICD-10-CM | POA: Diagnosis not present

## 2018-09-02 DIAGNOSIS — M545 Low back pain, unspecified: Secondary | ICD-10-CM

## 2018-09-02 NOTE — Progress Notes (Signed)
This visit type was conducted due to national recommendations for restrictions regarding the COVID-19 pandemic in an effort to limit this patient's exposure and mitigate transmission in our community.   Virtual Visit via Telephone Note  I connected with Mauriana Dann on 09/02/18 at  4:30 PM EDT by telephone and verified that I am speaking with the correct person using two identifiers.   I discussed the limitations, risks, security and privacy concerns of performing an evaluation and management service by telephone and the availability of in person appointments. I also discussed with the patient that there may be a patient responsible charge related to this service. The patient expressed understanding and agreed to proceed.  Location patient: home Location provider: work or home office Participants present for the call: patient, provider Patient did not have a visit in the prior 7 days to address this/these issue(s).   History of Present Illness: Patient called to discuss recent back pain and increased anxiety symptoms.  Her husband has advanced neurologic disease and is extremely dependent in care and she has to do a lot of lifting and straining with him.  She thinks she may have strained her low back with working with him a few days ago.  Her pain is bilateral lower lumbar area.  No radiculitis symptoms.  Pain is worse with changing positions.  Denies any lower extremity numbness or weakness.  She has taken some Tylenol and Aleve and also using some topical heat with some improvement.  No loss of bladder or bowel control.  Second issue is increased anxiety symptoms.  She describes decreased frustration tolerance.  She is currently on Zoloft 50 mg daily and initially saw some improvement with that.  She had questions about possible titration.  She feels more anxious and agitated than depressed.  Her husband tends to resist her assistance multiple times though he is totally dependent in care    Observations/Objective: Patient sounds cheerful and well on the phone. I do not appreciate any SOB. Speech and thought processing are grossly intact. Patient reported vitals:  Assessment and Plan:  #1 acute low back strain -Continue conservative treatment with heat and/or ice, Tylenol.  Cautioned about nonsteroidals.  Consider physical therapy if not improving over the next couple of weeks  #2 situational anxiety  Patient will consider increasing sertraline to 100 mg daily and getting some feedback in next couple of weeks.  Follow Up Instructions:  -As above   99441 5-10 [id number] 11-20 99443 21-30 I did not refer this patient for an OV in the next 24 hours for this/these issue(s).  I discussed the assessment and treatment plan with the patient. The patient was provided an opportunity to ask questions and all were answered. The patient agreed with the plan and demonstrated an understanding of the instructions.   The patient was advised to call back or seek an in-person evaluation if the symptoms worsen or if the condition fails to improve as anticipated.  I provided 22 minutes of non-face-to-face time during this encounter.   Carolann Littler, MD

## 2018-09-24 ENCOUNTER — Telehealth: Payer: Self-pay | Admitting: *Deleted

## 2018-09-24 ENCOUNTER — Other Ambulatory Visit: Payer: Self-pay

## 2018-09-24 NOTE — Telephone Encounter (Signed)
Patient called c/o right side achy pelvic discomfort x 2 weeks. No urinary symptoms, no bowel issues. transferred to appointment desk to schedule visit.

## 2018-09-28 ENCOUNTER — Ambulatory Visit: Payer: Medicare Other | Admitting: Gynecology

## 2018-09-28 ENCOUNTER — Other Ambulatory Visit: Payer: Self-pay

## 2018-09-28 ENCOUNTER — Ambulatory Visit (INDEPENDENT_AMBULATORY_CARE_PROVIDER_SITE_OTHER): Payer: Medicare Other | Admitting: Gynecology

## 2018-09-28 ENCOUNTER — Encounter: Payer: Self-pay | Admitting: Gynecology

## 2018-09-28 VITALS — BP 124/80

## 2018-09-28 DIAGNOSIS — R102 Pelvic and perineal pain: Secondary | ICD-10-CM | POA: Diagnosis not present

## 2018-09-28 NOTE — Patient Instructions (Signed)
Follow-up with your gastroenterologist in reference to your right lower quadrant pain.

## 2018-09-28 NOTE — Progress Notes (Signed)
Margaret Mitchell March 13, 1940 [id number]        78 y.o.  G1P0010 presents with a several week history of right lower quadrant pain.  Initially started on her right back and then migrated to her appendix scar region.  Having alternating constipation with diarrhea over the last several weeks.  No nausea vomiting.  No fever chills.  No urinary symptoms such as frequency dysuria urgency low back pain fever or chills.  History of TAH/BSO for pelvic pain and adhesions.  Past medical history,surgical history, problem list, medications, allergies, family history and social history were all reviewed and documented in the EPIC chart.  Directed ROS with pertinent positives and negatives documented in the history of present illness/assessment and plan.  Exam: Kennon Portela assistant Vitals:   09/28/18 1355  BP: 124/80   General appearance:  Normal Spine straight without CVA tenderness Abdomen soft nontender without masses guarding rebound Pelvic external BUS vagina with atrophic changes.  Bimanual without masses or tenderness.  Rectal exam is normal  Assessment/Plan:  78 y.o. G1P0010 with several weeks of right lower quadrant pain.  Some constipation with diarrhea alternating.  No urinary symptoms.  Status post hysterectomy with BSO.  Urine analysis unremarkable.  Will culture for completeness.  Reviewed with patient more than likely GI etiology for her pain given the history.  Recommended she follow-up with her gastroenterologist.  Discussed possible follow-up studies to include CT scan but will allow gastroenterologist to make that decision.  Patient agrees with the plan.   Dara Lords MD, 2:05 PM 09/28/2018

## 2018-09-29 ENCOUNTER — Encounter: Payer: Self-pay | Admitting: Gynecology

## 2018-09-30 ENCOUNTER — Other Ambulatory Visit: Payer: Self-pay

## 2018-10-01 ENCOUNTER — Ambulatory Visit (INDEPENDENT_AMBULATORY_CARE_PROVIDER_SITE_OTHER): Payer: Medicare Other

## 2018-10-01 ENCOUNTER — Ambulatory Visit (INDEPENDENT_AMBULATORY_CARE_PROVIDER_SITE_OTHER): Payer: Medicare Other | Admitting: Gynecology

## 2018-10-01 ENCOUNTER — Encounter: Payer: Self-pay | Admitting: Gynecology

## 2018-10-01 ENCOUNTER — Telehealth: Payer: Self-pay | Admitting: *Deleted

## 2018-10-01 VITALS — BP 122/82

## 2018-10-01 DIAGNOSIS — Z23 Encounter for immunization: Secondary | ICD-10-CM | POA: Diagnosis not present

## 2018-10-01 DIAGNOSIS — N644 Mastodynia: Secondary | ICD-10-CM

## 2018-10-01 LAB — URINALYSIS, COMPLETE W/RFL CULTURE
Bilirubin Urine: NEGATIVE
Glucose, UA: NEGATIVE
Hgb urine dipstick: NEGATIVE
Hyaline Cast: NONE SEEN /LPF
Nitrites, Initial: NEGATIVE
Protein, ur: NEGATIVE
Specific Gravity, Urine: 1.02 (ref 1.001–1.03)
pH: 6 (ref 5.0–8.0)

## 2018-10-01 LAB — URINE CULTURE
MICRO NUMBER:: 882500
SPECIMEN QUALITY:: ADEQUATE

## 2018-10-01 LAB — CULTURE INDICATED

## 2018-10-01 MED ORDER — SULFAMETHOXAZOLE-TRIMETHOPRIM 800-160 MG PO TABS: 1.0000 | ORAL_TABLET | Freq: Two times a day (BID) | ORAL | 0 refills | Status: DC

## 2018-10-01 NOTE — Progress Notes (Signed)
Margaret Mitchell [birth year] [id number]        78 y.o.  G1P0010 presents having called to arrange for her mammogram complaining of right breast burning over the last 2 weeks.  She was told to follow-up with her physician.  Notes primarily in the lateral portion of her right breast a burning sensation.  No masses palpated on self-exam.  No nipple discharge.  Last mammogram 11/2017 normal.  Past medical history,surgical history, problem list, medications, allergies, family history and social history were all reviewed and documented in the EPIC chart.  Directed ROS with pertinent positives and negatives documented in the history of present illness/assessment and plan.  Exam: Kennon Portela assistant Vitals:   10/01/18 1022  BP: 122/82   General appearance:  Normal Both breasts examined lying and sitting without masses, retractions, discharge, adenopathy.  Assessment/Plan:  78 y.o. G1P0010 2-week history of right lateral breast burning.  Exam is normal.  Recommend diagnostic mammography and ultrasound of the right tail of Spence region.  Patient will arrange in follow-up for this.  If normal then recommend self breast exams and following for now.  If persists though she knows to call me and we will consider a more involved evaluation.    Dara Lords MD, 10:34 AM 10/01/2018

## 2018-10-01 NOTE — Telephone Encounter (Signed)
-----   Message from Anastasio Auerbach, MD sent at 10/01/2018 10:35 AM EDT ----- Schedule diagnostic mammography and ultrasound right breast reference right tail of Spence burning pain.  No palpable abnormalities on exam.

## 2018-10-01 NOTE — Patient Instructions (Signed)
[facility] should call you to arrange for the mammogram and ultrasound.  Call if you do not hear from them within the next week

## 2018-10-01 NOTE — Telephone Encounter (Signed)
Patient scheduled on 10/08/18 @ 1:30pm at [facility], order faxed. Patient informed with time and date. Also informed with result "Tell patient her urine did start to grow out bacteria and I want to cover her with antibiotics. I want to cover her with Septra DS 1 p.o. twice daily x5 days.  I explained to patient allergy reaction alert came when prescribing medication. Patient said it only 5 days worth of medication she thinks she will be fine, patient said this "issue " with the medication was years ago. She asked me to send Rx into pharmacy for her. This was done.

## 2018-10-08 DIAGNOSIS — N644 Mastodynia: Secondary | ICD-10-CM | POA: Diagnosis not present

## 2018-10-08 DIAGNOSIS — R208 Other disturbances of skin sensation: Secondary | ICD-10-CM | POA: Diagnosis not present

## 2018-10-09 ENCOUNTER — Encounter: Payer: Self-pay | Admitting: Gynecology

## 2018-10-15 ENCOUNTER — Encounter: Payer: Self-pay | Admitting: Family Medicine

## 2018-10-22 ENCOUNTER — Encounter: Payer: Self-pay | Admitting: Gynecology

## 2018-11-03 DIAGNOSIS — Z961 Presence of intraocular lens: Secondary | ICD-10-CM | POA: Diagnosis not present

## 2018-11-03 DIAGNOSIS — H524 Presbyopia: Secondary | ICD-10-CM | POA: Diagnosis not present

## 2018-11-03 DIAGNOSIS — H52222 Regular astigmatism, left eye: Secondary | ICD-10-CM | POA: Diagnosis not present

## 2018-11-03 DIAGNOSIS — H26493 Other secondary cataract, bilateral: Secondary | ICD-10-CM | POA: Diagnosis not present

## 2018-11-03 DIAGNOSIS — H04123 Dry eye syndrome of bilateral lacrimal glands: Secondary | ICD-10-CM | POA: Diagnosis not present

## 2018-11-03 DIAGNOSIS — H33321 Round hole, right eye: Secondary | ICD-10-CM | POA: Diagnosis not present

## 2018-11-03 DIAGNOSIS — H5202 Hypermetropia, left eye: Secondary | ICD-10-CM | POA: Diagnosis not present

## 2018-11-03 DIAGNOSIS — H5201 Hypermetropia, right eye: Secondary | ICD-10-CM | POA: Diagnosis not present

## 2018-11-04 ENCOUNTER — Encounter: Payer: Self-pay | Admitting: Family Medicine

## 2018-11-09 ENCOUNTER — Other Ambulatory Visit: Payer: Self-pay

## 2018-11-09 ENCOUNTER — Ambulatory Visit (INDEPENDENT_AMBULATORY_CARE_PROVIDER_SITE_OTHER): Payer: Medicare Other | Admitting: Family Medicine

## 2018-11-09 ENCOUNTER — Encounter: Payer: Self-pay | Admitting: Family Medicine

## 2018-11-09 VITALS — BP 120/60 | HR 60 | Temp 97.7°F | Ht 66.5 in | Wt 191.3 lb

## 2018-11-09 DIAGNOSIS — F321 Major depressive disorder, single episode, moderate: Secondary | ICD-10-CM | POA: Diagnosis not present

## 2018-11-09 DIAGNOSIS — M545 Low back pain, unspecified: Secondary | ICD-10-CM

## 2018-11-09 MED ORDER — SERTRALINE HCL 100 MG PO TABS: 100.0000 mg | ORAL_TABLET | Freq: Every day | ORAL | 3 refills | Status: DC

## 2018-11-09 NOTE — Patient Instructions (Signed)
Acute Back Pain, Adult Acute back pain is sudden and usually short-lived. It is often caused by an injury to the muscles and tissues in the back. The injury may result from:  A muscle or ligament getting overstretched or torn (strained). Ligaments are tissues that connect bones to each other. Lifting something improperly can cause a back strain.  Wear and tear (degeneration) of the spinal disks. Spinal disks are circular tissue that provides cushioning between the bones of the spine (vertebrae).  Twisting motions, such as while playing sports or doing yard work.  A hit to the back.  Arthritis. You may have a physical exam, lab tests, and imaging tests to find the cause of your pain. Acute back pain usually goes away with rest and home care. Follow these instructions at home: Managing pain, stiffness, and swelling  Take over-the-counter and prescription medicines only as told by your health care provider.  Your health care provider may recommend applying ice during the first 24-48 hours after your pain starts. To do this: ? Put ice in a plastic bag. ? Place a towel between your skin and the bag. ? Leave the ice on for 20 minutes, 2-3 times a day.  If directed, apply heat to the affected area as often as told by your health care provider. Use the heat source that your health care provider recommends, such as a moist heat pack or a heating pad. ? Place a towel between your skin and the heat source. ? Leave the heat on for 20-30 minutes. ? Remove the heat if your skin turns bright red. This is especially important if you are unable to feel pain, heat, or cold. You have a greater risk of getting burned. Activity   Do not stay in bed. Staying in bed for more than 1-2 days can delay your recovery.  Sit up and stand up straight. Avoid leaning forward when you sit, or hunching over when you stand. ? If you work at a desk, sit close to it so you do not need to lean over. Keep your chin tucked  in. Keep your neck drawn back, and keep your elbows bent at a right angle. Your arms should look like the letter "L." ? Sit high and close to the steering wheel when you drive. Add lower back (lumbar) support to your car seat, if needed.  Take short walks on even surfaces as soon as you are able. Try to increase the length of time you walk each day.  Do not sit, drive, or stand in one place for more than 30 minutes at a time. Sitting or standing for long periods of time can put stress on your back.  Do not drive or use heavy machinery while taking prescription pain medicine.  Use proper lifting techniques. When you bend and lift, use positions that put less stress on your back: ? Bend your knees. ? Keep the load close to your body. ? Avoid twisting.  Exercise regularly as told by your health care provider. Exercising helps your back heal faster and helps prevent back injuries by keeping muscles strong and flexible.  Work with a physical therapist to make a safe exercise program, as recommended by your health care provider. Do any exercises as told by your physical therapist. Lifestyle  Maintain a healthy weight. Extra weight puts stress on your back and makes it difficult to have good posture.  Avoid activities or situations that make you feel anxious or stressed. Stress and anxiety increase muscle   tension and can make back pain worse. Learn ways to manage anxiety and stress, such as through exercise. General instructions  Sleep on a firm mattress in a comfortable position. Try lying on your side with your knees slightly bent. If you lie on your back, put a pillow under your knees.  Follow your treatment plan as told by your health care provider. This may include: ? Cognitive or behavioral therapy. ? Acupuncture or massage therapy. ? Meditation or yoga. Contact a health care provider if:  You have pain that is not relieved with rest or medicine.  You have increasing pain going down  into your legs or buttocks.  Your pain does not improve after 2 weeks.  You have pain at night.  You lose weight without trying.  You have a fever or chills. Get help right away if:  You develop new bowel or bladder control problems.  You have unusual weakness or numbness in your arms or legs.  You develop nausea or vomiting.  You develop abdominal pain.  You feel faint. Summary  Acute back pain is sudden and usually short-lived.  Use proper lifting techniques. When you bend and lift, use positions that put less stress on your back.  Take over-the-counter and prescription medicines and apply heat or ice as directed by your health care provider. This information is not intended to replace advice given to you by your health care provider. Make sure you discuss any questions you have with your health care provider. Document Released: 12/31/2004 Document Revised: 04/21/2018 Document Reviewed: 08/14/2016 Elsevier Patient Education  2020 Elsevier Inc.  

## 2018-11-09 NOTE — Progress Notes (Signed)
Subjective:     Patient ID: Margaret Mitchell, female   DOB: 02/11/1940, 78 y.o.   MRN: [id number]  HPI  Margaret Mitchell is seen with right lower back pain.  She is not sure exactly when this started but she states this has been least several weeks.  Her husband is very debilitated and she sometimes has to help with lifting and repositioning him.  She describes a sharp pain which is somewhat intermittent.  Mostly right lower lumbar but occasional radiation into the buttock and right lower extremity.  No numbness or weakness.  No urinary or stool incontinence.  Occasional radiation right lower abdominal region.  She saw her gynecologist and they did not have any concerns for GYN issue.  Her pain is worse standing.  3-4 out of 10 in severity.  Some improvement with Tylenol and Aspercreme.  She has history of some chronic peripheral neuropathy symptoms stable on gabapentin  History of recurrent depression.  She is currently on sertraline 100 mg daily which seems to be working fairly well.  Her husband just recently transitioned to hospice care  Past Medical History:  Diagnosis Date  . Allergy, unspecified not elsewhere classified   . Anxiety   . Arthritis   . Cervical dysplasia    prior to hysterectomy  . GERD (gastroesophageal reflux disease)   . Headache(784.0)   . Hypertension   . IBS (irritable bowel syndrome)   . Osteopenia 12/2015   T score -1.1 FRAX 3.7%/0.4%  . Pelvic adhesions   . Pelvic pain in female   . Sinus infection   . Thyroid disease    Parathyroid cyst   Past Surgical History:  Procedure Laterality Date  . ABDOMINAL HYSTERECTOMY  1983   TAH/BSO for pelvic pain and adhesions  . APPENDECTOMY  1994  . CATARACT EXTRACTION Bilateral 2013  . COLPOSCOPY    . FOOT SURGERY Bilateral   . PARATHYROID ADENOMA REMOVED  02/1980  . ROTATOR CUFF REPAIR     X2  . SHOULDER INJECTION  10/2011  . TONSILLECTOMY    . TUBAL LIGATION  1981  . VESICOVAGINAL FISTULA CLOSURE W/ TAH      reports that she has never smoked. She has never used smokeless tobacco. She reports current alcohol use. She reports that she does not use drugs. family history includes Diabetes in her maternal aunt, maternal grandmother, and mother; Heart disease in her maternal grandmother; Heart failure in her paternal grandmother; Hypertension in her maternal grandmother; Leukemia in her brother; Lung cancer in her father; Stroke in her maternal grandmother. Allergies  Allergen Reactions  . Other     Seldane  . Prednisone     REACTION: heart palpatations  In larger doses  . Seldane [Terfenadine]     SELDANE  . Sulfa Antibiotics Other (See Comments)    REACTION: pt unable to remember REACTION: pt unable to remember  . Sulfasalazine Other (See Comments)    REACTION: pt unable to remember  . Sulfonamide Derivatives     REACTION: pt unable to remember  . Vioxx [Rofecoxib]     REACTION: mouth swelling (VIOXX)    Review of Systems  Constitutional: Negative for appetite change, chills, fever and unexpected weight change.  Gastrointestinal: Negative for abdominal pain.  Genitourinary: Negative for dysuria.  Musculoskeletal: Positive for back pain.  Neurological: Negative for weakness and numbness.  Hematological: Negative for adenopathy.       Objective:   Physical Exam Constitutional:      Appearance: Normal  appearance.  Cardiovascular:     Rate and Rhythm: Normal rate and regular rhythm.  Pulmonary:     Effort: Pulmonary effort is normal.     Breath sounds: Normal breath sounds.  Musculoskeletal:     Right lower leg: No edema.     Left lower leg: No edema.     Comments: Straight leg raise negative. No reproducible tenderness in the low back region.  Neurological:     Mental Status: She is alert.     Comments: Trace to 1+ reflex knee and ankle bilaterally.  Full strength lower extremities.  Normal sensory function to touch.        Assessment:     #1 right lower lumbar back pain.   Suspect musculoskeletal.  Nonfocal exam neurologically.  #2 history of depression currently stable on sertraline    Plan:     -We recommend continue conservative therapy with Tylenol as needed along with heat or ice for symptom relief -Aerobic activity such as walking as tolerated -Follow-up immediately for any weakness, numbness, progressive pain, or other concerns -Consider physical therapy if not improving over the next couple weeks  Refilled her sertraline 100 mg daily-for 1 year  Margaret Covey MD [facility] at [facility]

## 2018-11-11 ENCOUNTER — Encounter: Payer: Self-pay | Admitting: Family Medicine

## 2018-12-09 ENCOUNTER — Other Ambulatory Visit: Payer: Self-pay

## 2018-12-14 ENCOUNTER — Other Ambulatory Visit: Payer: Self-pay

## 2018-12-14 ENCOUNTER — Ambulatory Visit (INDEPENDENT_AMBULATORY_CARE_PROVIDER_SITE_OTHER): Payer: Medicare Other | Admitting: Gynecology

## 2018-12-14 ENCOUNTER — Encounter: Payer: Self-pay | Admitting: Gynecology

## 2018-12-14 VITALS — BP 124/82 | Ht 67.0 in | Wt 190.0 lb

## 2018-12-14 DIAGNOSIS — Z01419 Encounter for gynecological examination (general) (routine) without abnormal findings: Secondary | ICD-10-CM | POA: Diagnosis not present

## 2018-12-14 DIAGNOSIS — N952 Postmenopausal atrophic vaginitis: Secondary | ICD-10-CM

## 2018-12-14 DIAGNOSIS — M8580 Other specified disorders of bone density and structure, unspecified site: Secondary | ICD-10-CM

## 2018-12-14 NOTE — Progress Notes (Signed)
Margaret Mitchell 09/17/1940 [id number]        78 y.o.  G1P0010 for breast and pelvic exam.  Without gynecologic complaints.  Seen for lower abdominal discomfort and September as well as mastalgia.  Both symptoms resolved.  Mammogram/ultrasound were negative.  Past medical history,surgical history, problem list, medications, allergies, family history and social history were all reviewed and documented as reviewed in the EPIC chart.  ROS:  Performed with pertinent positives and negatives included in the history, assessment and plan.   Additional significant findings : None   Exam: Kennon Portela assistant Vitals:   12/14/18 0928  BP: 124/82  Weight: 190 lb (86.2 kg)  Height: 5\' 7"  (1.702 m)   Body mass index is 29.76 kg/m.  General appearance:  Normal affect, orientation and appearance. Skin: Grossly normal HEENT: Without gross lesions.  No cervical or supraclavicular adenopathy. Thyroid normal.  Lungs:  Clear without wheezing, rales or rhonchi Cardiac: RR, without RMG Abdominal:  Soft, nontender, without masses, guarding, rebound, organomegaly or hernia Breasts:  Examined lying and sitting without masses, retractions, discharge or axillary adenopathy. Pelvic:  Ext, BUS, Vagina: With atrophic changes  Adnexa: Without masses or tenderness    Anus and perineum: Normal   Rectovaginal: Normal sphincter tone without palpated masses or tenderness.    Assessment/Plan:  77 y.o. G1P0010 female for breast and pelvic exam  1. Postmenopausal.  Status post TAH/BSO for pelvic pain and adhesions.  No significant menopausal symptoms. 2. Mammography 09/2018.  Continue with annual mammography next year when due.  Breast exam normal today. 3. Osteopenia.  DEXA 2017 T score -1.1 FRAX 3.7% / 0.4%.  Recommend follow-up DEXA now at 3-year interval and she will schedule in follow-up for this. 4. Colonoscopy 2017.  Repeat at their recommended interval. 5. Pap smear 2011.  No Pap smear done today.   History of dysplasia proceeding hysterectomy.  Normal Pap smears since.  We both agree to stop screening per current screening guidelines. 6. Health maintenance.  No routine lab work done as patient does this elsewhere.  Follow-up for bone density.  Follow-up for annual exam in 1 year.   Dara Lords MD, 9:53 AM 12/14/2018

## 2018-12-14 NOTE — Patient Instructions (Signed)
Follow-up for the bone density as scheduled.  Follow-up in 1 year for annual exam.

## 2018-12-15 IMAGING — DX DG LUMBAR SPINE COMPLETE 4+V
5 series · 5 of 5 positions shown · non-contrast
Comparison: None.

CLINICAL DATA: Low back pain radiating into the right leg for
several weeks

EXAM:
LUMBAR SPINE - COMPLETE 4+ VIEW

[l-spine ap]
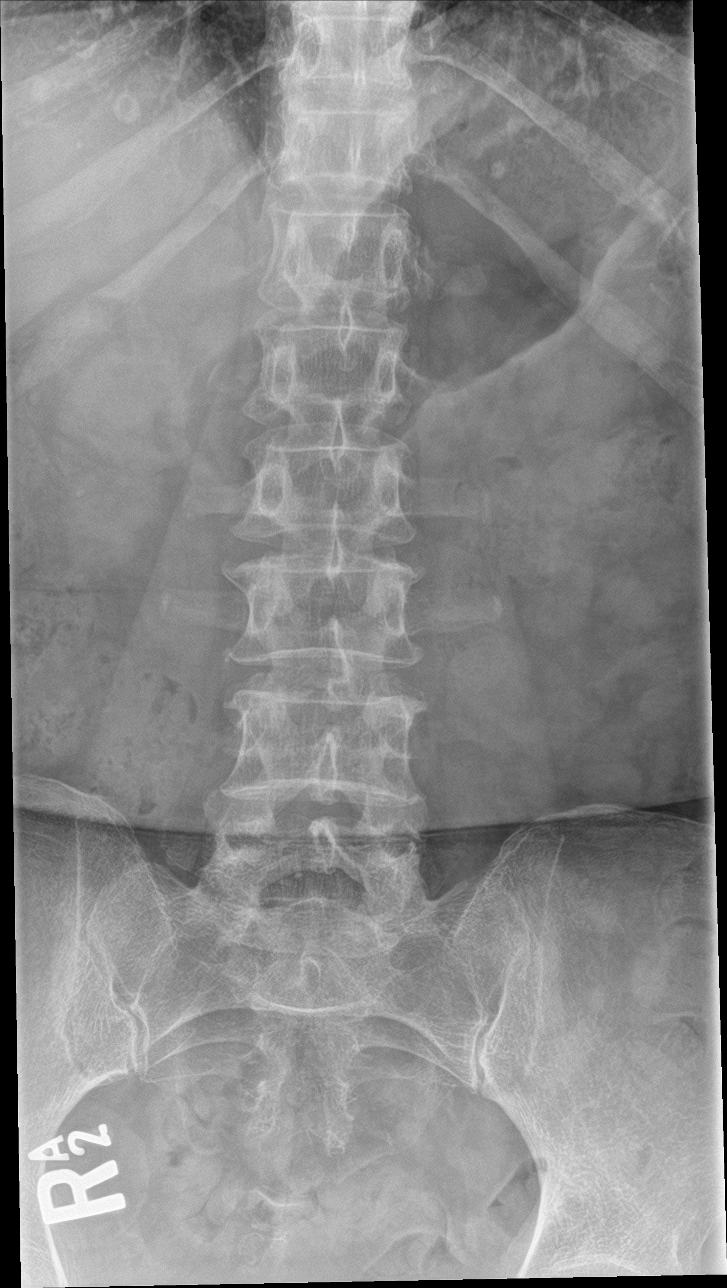

[l-spine obl (1 of 2)]
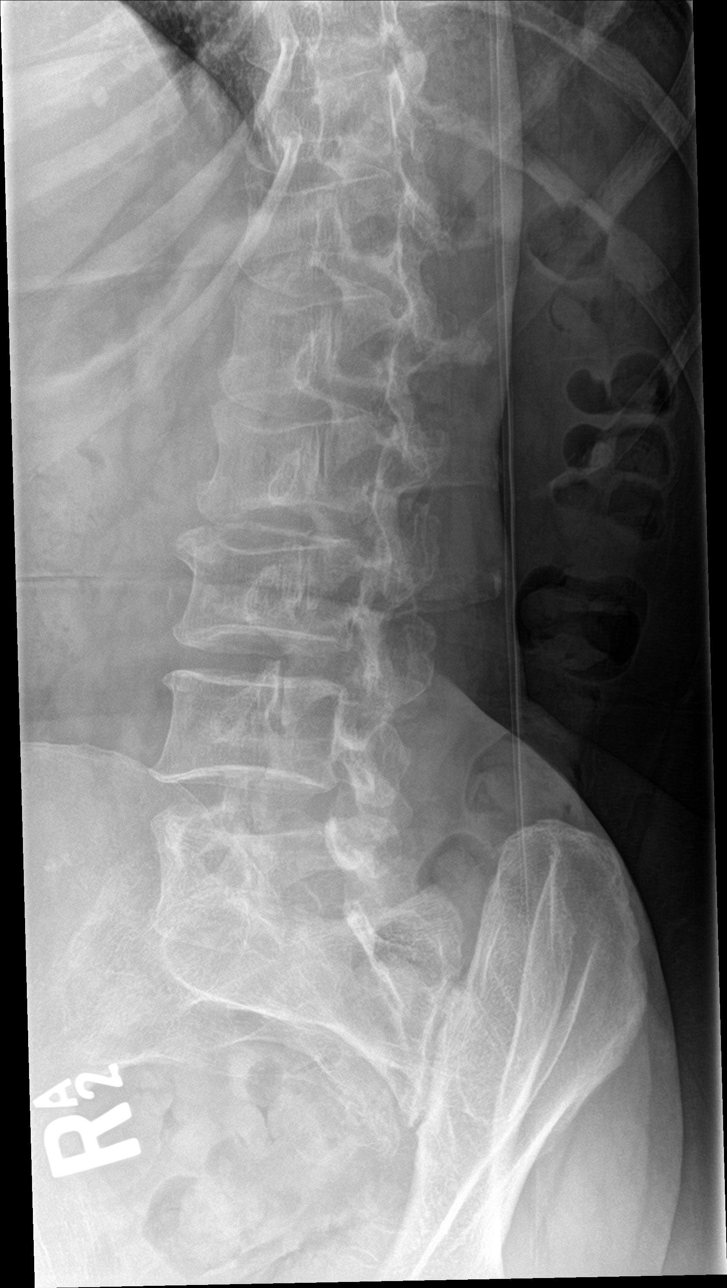

[l-spine obl (2 of 2)]
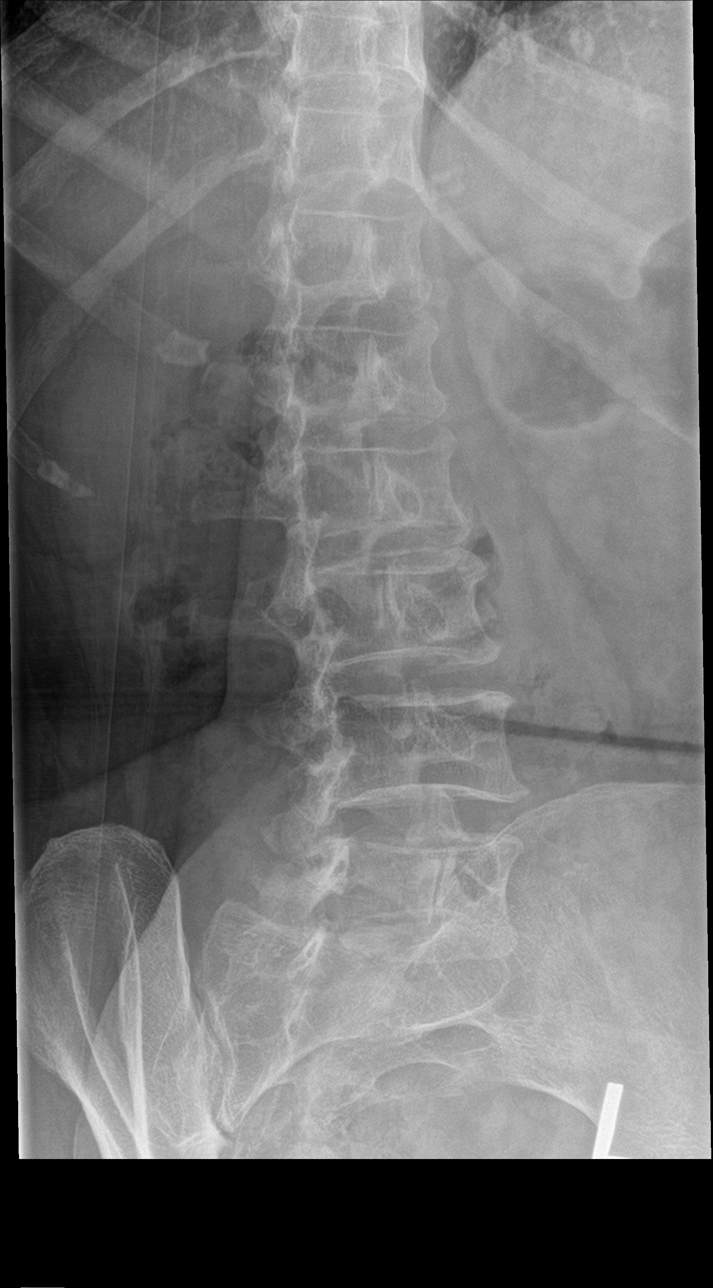

[l-spine lat]
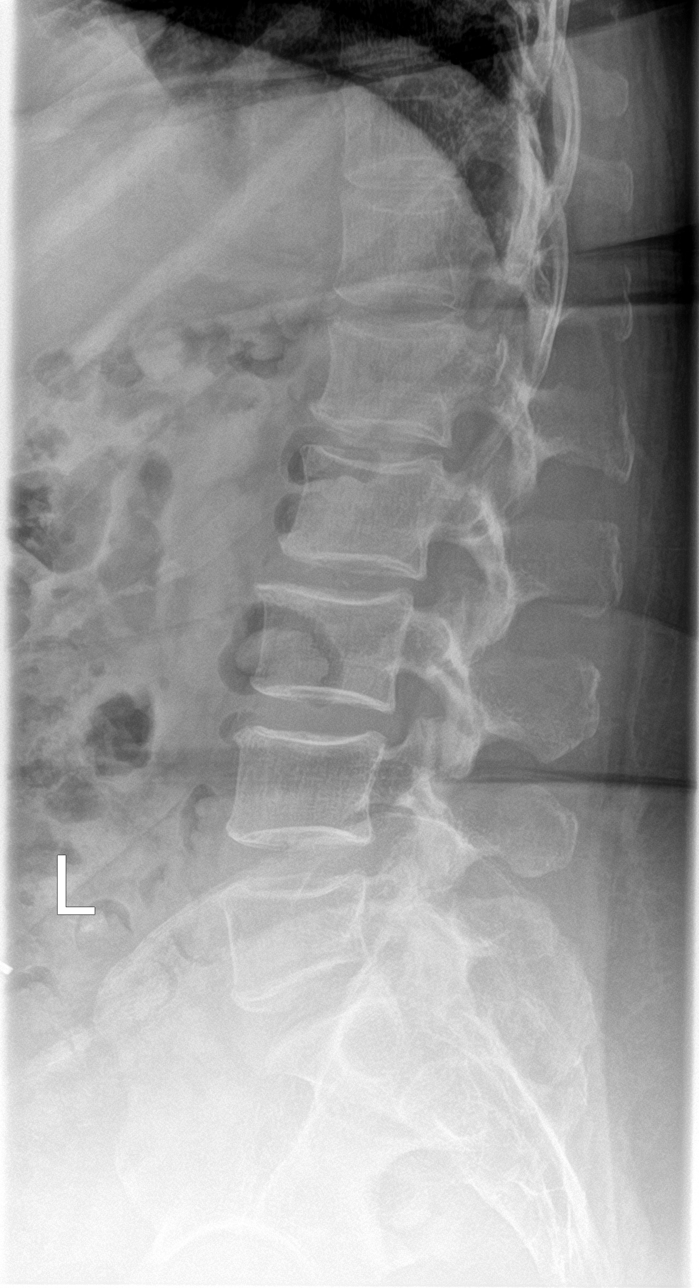

[l-spine spot]
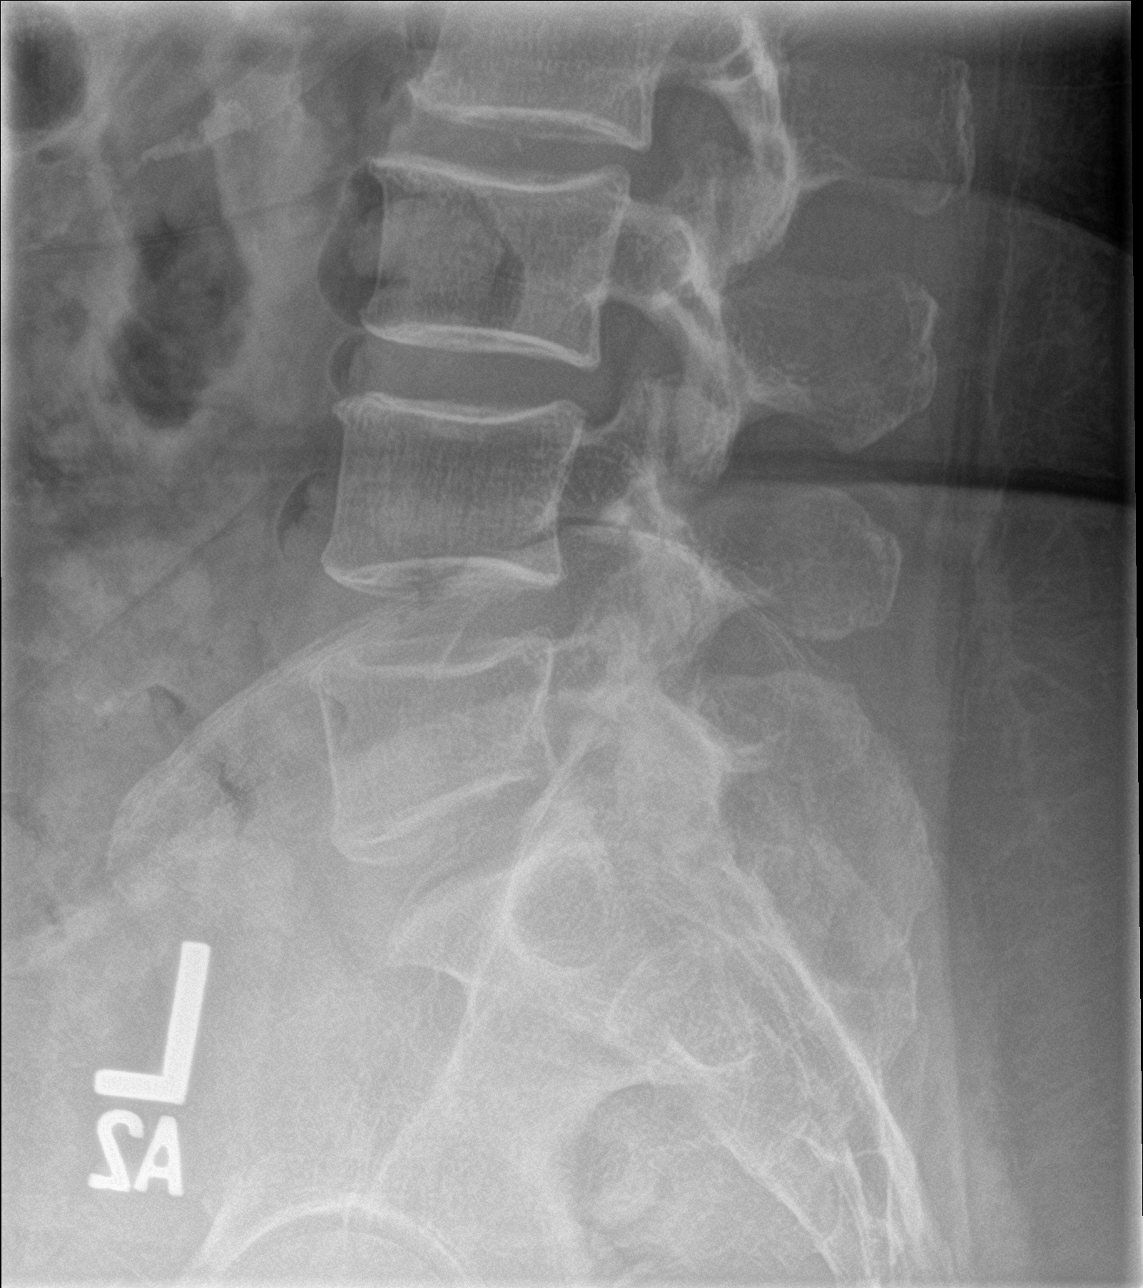

[5 of 5 positions shown; findings below may reference images not displayed]

FINDINGS: Vertebral body height is well maintained. No pars defects are seen.
No anterolisthesis is noted. Mild osteophytic changes are seen. Tiny
calcification is noted over the lower pole of the right kidney
consistent with small renal stone measuring 4 mm.
IMPRESSION: Mild degenerative changes of lumbar spine.

Nonobstructing right renal stone.

## 2018-12-22 ENCOUNTER — Encounter: Payer: Self-pay | Admitting: Family Medicine

## 2018-12-23 ENCOUNTER — Telehealth: Payer: Self-pay | Admitting: Family Medicine

## 2018-12-23 NOTE — Telephone Encounter (Signed)
See if we can get a sympathy card for her.  Thanks.

## 2018-12-23 NOTE — Telephone Encounter (Signed)
Called patient and she stated that her husband Jenny Reichmann passed away this morning and she wanted to let you know. Patient stated that she will call back to schedule an in office appointment.

## 2018-12-23 NOTE — Telephone Encounter (Signed)
Copied from [place] [id number]. Topic: General - Other >> Dec 22, 2018  3:00 PM Leward Quan A wrote: Reason for CRM: Patient called back to schedule an appointment need something for tomorrow. Asking if Jinny Blossom can call her back and help her schedule please. Ph# [phone]

## 2018-12-29 NOTE — Telephone Encounter (Signed)
Sympathy card has been signed and is going to be sent out.

## 2018-12-30 ENCOUNTER — Other Ambulatory Visit: Payer: Self-pay | Admitting: Family Medicine

## 2019-01-01 ENCOUNTER — Ambulatory Visit: Payer: Medicare Other | Attending: Internal Medicine

## 2019-01-01 DIAGNOSIS — Z20822 Contact with and (suspected) exposure to covid-19: Secondary | ICD-10-CM

## 2019-01-02 LAB — NOVEL CORONAVIRUS, NAA: SARS-CoV-2, NAA: NOT DETECTED

## 2019-01-03 ENCOUNTER — Encounter: Payer: Self-pay | Admitting: Family Medicine

## 2019-01-20 ENCOUNTER — Other Ambulatory Visit: Payer: Self-pay

## 2019-01-21 ENCOUNTER — Other Ambulatory Visit: Payer: Self-pay | Admitting: Obstetrics & Gynecology

## 2019-01-21 ENCOUNTER — Ambulatory Visit (INDEPENDENT_AMBULATORY_CARE_PROVIDER_SITE_OTHER): Payer: Medicare Other

## 2019-01-21 ENCOUNTER — Other Ambulatory Visit: Payer: Self-pay | Admitting: Gynecology

## 2019-01-21 DIAGNOSIS — Z780 Asymptomatic menopausal state: Secondary | ICD-10-CM

## 2019-01-21 DIAGNOSIS — M8588 Other specified disorders of bone density and structure, other site: Secondary | ICD-10-CM | POA: Diagnosis not present

## 2019-01-21 DIAGNOSIS — M8580 Other specified disorders of bone density and structure, unspecified site: Secondary | ICD-10-CM

## 2019-01-26 ENCOUNTER — Ambulatory Visit: Payer: Medicare Other | Attending: Internal Medicine

## 2019-01-26 DIAGNOSIS — Z23 Encounter for immunization: Secondary | ICD-10-CM

## 2019-01-26 NOTE — Progress Notes (Signed)
Covid-19 Vaccination Clinic  Name:  Margaret Mitchell    MRN: [id number] DOB: 02-05-1940  01/26/2019  [name] was observed post Covid-19 immunization for 15 minutes without incidence. She was provided with Vaccine Information Sheet and instruction to access the V-Safe system.   [name] was instructed to call 911 with any severe reactions post vaccine: Marland Kitchen Difficulty breathing  . Swelling of your face and throat  . A fast heartbeat  . A bad rash all over your body  . Dizziness and weakness    Immunizations Administered    Name Date Dose VIS Date Route   Pfizer COVID-19 Vaccine 01/26/2019 10:31 AM 0.3 mL 12/25/2018 Intramuscular   Manufacturer: [employer], [employer]   Lot: [id number]   NDC: [address]-3

## 2019-02-12 ENCOUNTER — Ambulatory Visit: Payer: Medicare Other

## 2019-02-13 ENCOUNTER — Ambulatory Visit: Payer: Medicare Other | Attending: Internal Medicine

## 2019-02-13 DIAGNOSIS — Z23 Encounter for immunization: Secondary | ICD-10-CM

## 2019-02-13 NOTE — Progress Notes (Signed)
Covid-19 Vaccination Clinic  Name:  Margaret Mitchell    MRN: [id number] DOB: 1940/09/08  02/13/2019  Margaret Mitchell was observed post Covid-19 immunization for 15 minutes without incidence. She was provided with Vaccine Information Sheet and instruction to access the V-Safe system.   Margaret Mitchell was instructed to call 911 with any severe reactions post vaccine: Marland Kitchen Difficulty breathing  . Swelling of your face and throat  . A fast heartbeat  . A bad rash all over your body  . Dizziness and weakness    Immunizations Administered    Name Date Dose VIS Date Route   Pfizer COVID-19 Vaccine 02/13/2019  1:40 PM 0.3 mL 12/25/2018 Intramuscular   Manufacturer: [employer], [employer]   Lot: [id number]   NDC: [address]-1

## 2019-02-21 ENCOUNTER — Encounter: Payer: Self-pay | Admitting: Family Medicine

## 2019-02-23 ENCOUNTER — Other Ambulatory Visit: Payer: Self-pay

## 2019-02-23 ENCOUNTER — Ambulatory Visit (INDEPENDENT_AMBULATORY_CARE_PROVIDER_SITE_OTHER): Payer: Medicare Other | Admitting: Family Medicine

## 2019-02-23 ENCOUNTER — Encounter: Payer: Self-pay | Admitting: Family Medicine

## 2019-02-23 ENCOUNTER — Ambulatory Visit: Payer: Medicare Other | Admitting: Family Medicine

## 2019-02-23 VITALS — BP 128/80 | HR 64 | Temp 97.2°F | Ht 67.0 in | Wt 192.9 lb

## 2019-02-23 DIAGNOSIS — F321 Major depressive disorder, single episode, moderate: Secondary | ICD-10-CM

## 2019-02-23 DIAGNOSIS — R001 Bradycardia, unspecified: Secondary | ICD-10-CM | POA: Diagnosis not present

## 2019-02-23 DIAGNOSIS — I10 Essential (primary) hypertension: Secondary | ICD-10-CM | POA: Diagnosis not present

## 2019-02-23 NOTE — Patient Instructions (Signed)
Reduce Metoprolol and Zoloft to one half tablet once daily  Let's plan on 3 week follow up.

## 2019-02-23 NOTE — Progress Notes (Signed)
Subjective:     Patient ID: Margaret Mitchell, female   DOB: 24-Nov-1940, 79 y.o.   MRN: [id number]  HPI Margaret Mitchell is here to discuss the following items  She feels her heart may be "slow "at times.  She takes metoprolol 100 mg daily and also on combination of amlodipine and benazepril.  She denies any syncope or presyncopal symptoms.  She feels that her heart rate is slow sometimes and she sits down.  She is not actually taken her pulse very often but has had readings as low as 55 but usually around 60.  She wonders if she could reduce her metoprolol dose.  She also has some intermittent fatigue issues.  Her husband passed away from complications of neurologic disorder several months ago.  She is coping fairly well.  She is on Zoloft 100 mg daily for depression symptoms.  She wonders about scaling back.  She states she feels very unemotional and also wonders if some of her fatigue issues could be related to Zoloft.  She feels her depression is very stable overall.  She has a couple [place] has been able to counsel with.  She has hypertension and blood pressure has been very stable.  No recent chest pains.  Past Medical History:  Diagnosis Date  . Allergy, unspecified not elsewhere classified   . Anxiety   . Arthritis   . Cervical dysplasia    prior to hysterectomy  . GERD (gastroesophageal reflux disease)   . Headache(784.0)   . Hypertension   . IBS (irritable bowel syndrome)   . Osteopenia 12/2015   T score -1.1 FRAX 3.7%/0.4%  . Pelvic adhesions   . Pelvic pain in female   . Sinus infection   . Thyroid disease    Parathyroid cyst   Past Surgical History:  Procedure Laterality Date  . ABDOMINAL HYSTERECTOMY  1983   TAH/BSO for pelvic pain and adhesions  . APPENDECTOMY  1994  . CATARACT EXTRACTION Bilateral 2013  . COLPOSCOPY    . FOOT SURGERY Bilateral   . PARATHYROID ADENOMA REMOVED  02/1980  . ROTATOR CUFF REPAIR     X2  . SHOULDER INJECTION  10/2011  . TONSILLECTOMY    .  TUBAL LIGATION  1981  . VESICOVAGINAL FISTULA CLOSURE W/ TAH      reports that she has never smoked. She has never used smokeless tobacco. She reports current alcohol use. She reports that she does not use drugs. family history includes Diabetes in her maternal aunt, maternal grandmother, and mother; Heart disease in her maternal grandmother; Heart failure in her paternal grandmother; Hypertension in her maternal grandmother; Leukemia in her brother; Lung cancer in her father; Stroke in her maternal grandmother. Allergies  Allergen Reactions  . Other     Seldane  . Prednisone     REACTION: heart palpatations  In larger doses  . Seldane [Terfenadine]     SELDANE  . Sulfa Antibiotics Other (See Comments)    REACTION: pt unable to remember REACTION: pt unable to remember  . Sulfasalazine Other (See Comments)    REACTION: pt unable to remember  . Sulfonamide Derivatives     REACTION: pt unable to remember  . Vioxx [Rofecoxib]     REACTION: mouth swelling (VIOXX)     Review of Systems  Constitutional: Positive for fatigue. Negative for chills and unexpected weight change.  Eyes: Negative for visual disturbance.  Respiratory: Negative for cough, chest tightness, shortness of breath and wheezing.   Cardiovascular: Negative for  chest pain and leg swelling.  Neurological: Negative for dizziness, seizures, syncope, weakness, light-headedness and headaches.       Objective:   Physical Exam Constitutional:      Appearance: She is well-developed.  Eyes:     Pupils: Pupils are equal, round, and reactive to light.  Neck:     Thyroid: No thyromegaly.     Vascular: No JVD.  Cardiovascular:     Rate and Rhythm: Normal rate and regular rhythm.     Heart sounds: No gallop.   Pulmonary:     Effort: Pulmonary effort is normal. No respiratory distress.     Breath sounds: Normal breath sounds. No wheezing or rales.  Musculoskeletal:     Cervical back: Neck supple.  Neurological:      Mental Status: She is alert.        Assessment:     #1 hypertension stable and at goal  #2 mild bradycardia.  Palpated pulse is around 55-60.  No recent red flags such as syncope  #3 history of depression currently stable on Zoloft    Plan:     -She will try reducing her metoprolol to 50 mg daily and also reduce sertraline to 50 mg daily  -We instructed her how to check her pulse at home to monitor periodically  -Set up 3-week follow-up to reassess  Kristian Covey MD [facility] at [facility]

## 2019-03-10 ENCOUNTER — Encounter: Payer: Self-pay | Admitting: Family Medicine

## 2019-03-15 ENCOUNTER — Other Ambulatory Visit: Payer: Self-pay

## 2019-03-16 ENCOUNTER — Encounter: Payer: Self-pay | Admitting: Family Medicine

## 2019-03-16 ENCOUNTER — Ambulatory Visit (INDEPENDENT_AMBULATORY_CARE_PROVIDER_SITE_OTHER): Payer: Medicare Other | Admitting: Family Medicine

## 2019-03-16 VITALS — BP 120/70 | HR 72 | Temp 97.6°F | Ht 67.0 in | Wt 193.2 lb

## 2019-03-16 DIAGNOSIS — R252 Cramp and spasm: Secondary | ICD-10-CM | POA: Diagnosis not present

## 2019-03-16 DIAGNOSIS — I10 Essential (primary) hypertension: Secondary | ICD-10-CM | POA: Diagnosis not present

## 2019-03-16 DIAGNOSIS — F339 Major depressive disorder, recurrent, unspecified: Secondary | ICD-10-CM

## 2019-03-16 DIAGNOSIS — G629 Polyneuropathy, unspecified: Secondary | ICD-10-CM

## 2019-03-16 NOTE — Progress Notes (Signed)
Subjective:     Patient ID: Margaret Mitchell, female   DOB: 30-Dec-1940, 79 y.o.   MRN: [id number]  HPI   Aunisty seen for medical follow-up.  Her husband died several months ago from progressive supranuclear palsy and she is still adjusting to that.  She is coping fairly well.  She has very supportive church and is planning to go back there soon.  She has history of hypertension, GERD, irritable bowel syndrome, neuropathy, intermittent muscle cramps.  She has been taking over-the-counter magnesium supplement which seems to help her cramps.  Medications reviewed.  She remains on Lotrel, gabapentin, metoprolol, and sertraline.  She is currently on 50 mg sertraline and feels that is helping with her depression symptoms.  Past Medical History:  Diagnosis Date  . Allergy, unspecified not elsewhere classified   . Anxiety   . Arthritis   . Cervical dysplasia    prior to hysterectomy  . GERD (gastroesophageal reflux disease)   . Headache(784.0)   . Hypertension   . IBS (irritable bowel syndrome)   . Osteopenia 12/2015   T score -1.1 FRAX 3.7%/0.4%  . Pelvic adhesions   . Pelvic pain in female   . Sinus infection   . Thyroid disease    Parathyroid cyst   Past Surgical History:  Procedure Laterality Date  . ABDOMINAL HYSTERECTOMY  1983   TAH/BSO for pelvic pain and adhesions  . APPENDECTOMY  1994  . CATARACT EXTRACTION Bilateral 2013  . COLPOSCOPY    . FOOT SURGERY Bilateral   . PARATHYROID ADENOMA REMOVED  02/1980  . ROTATOR CUFF REPAIR     X2  . SHOULDER INJECTION  10/2011  . TONSILLECTOMY    . TUBAL LIGATION  1981  . VESICOVAGINAL FISTULA CLOSURE W/ TAH      reports that she has never smoked. She has never used smokeless tobacco. She reports current alcohol use. She reports that she does not use drugs. family history includes Diabetes in her maternal aunt, maternal grandmother, and mother; Heart disease in her maternal grandmother; Heart failure in her paternal grandmother;  Hypertension in her maternal grandmother; Leukemia in her brother; Lung cancer in her father; Stroke in her maternal grandmother. Allergies  Allergen Reactions  . Other     Seldane  . Prednisone     REACTION: heart palpatations  In larger doses  . Seldane [Terfenadine]     SELDANE  . Sulfa Antibiotics Other (See Comments)    REACTION: pt unable to remember REACTION: pt unable to remember  . Sulfasalazine Other (See Comments)    REACTION: pt unable to remember  . Sulfonamide Derivatives     REACTION: pt unable to remember  . Vioxx [Rofecoxib]     REACTION: mouth swelling (VIOXX)     Review of Systems  Constitutional: Negative for fatigue.  Eyes: Negative for visual disturbance.  Respiratory: Negative for cough, chest tightness, shortness of breath and wheezing.   Cardiovascular: Negative for chest pain, palpitations and leg swelling.  Neurological: Negative for dizziness, seizures, syncope, weakness, light-headedness and headaches.       Objective:   Physical Exam Constitutional:      Appearance: She is well-developed.  Eyes:     Pupils: Pupils are equal, round, and reactive to light.  Neck:     Thyroid: No thyromegaly.     Vascular: No JVD.  Cardiovascular:     Rate and Rhythm: Normal rate and regular rhythm.     Heart sounds: No gallop.   Pulmonary:  Effort: Pulmonary effort is normal. No respiratory distress.     Breath sounds: Normal breath sounds. No wheezing or rales.  Musculoskeletal:     Cervical back: Neck supple.  Neurological:     Mental Status: She is alert.        Assessment:     #1 hypertension stable and at goal  #2 history of intermittent palpitations.  Fairly stable on metoprolol.  Previous echocardiogram 2017 reviewed with no major structural heart issues  #3 history of recent depression stable on sertraline  #4 history of frequent muscle cramps improved with over-the-counter magnesium supplement    Plan:     -Continue current  medications.  Set up routine follow-up in about 6 months.  Repeat labs at that point  Kristian Covey MD [facility] at [facility]

## 2019-03-16 NOTE — Patient Instructions (Signed)
Hypomagnesemia Hypomagnesemia is a condition in which the level of magnesium in the blood is low. Magnesium is a mineral that is found in many foods. It is used in many different processes in the body. Hypomagnesemia can affect every organ in the body. In severe cases, it can cause life-threatening problems. What are the causes? This condition may be caused by:  Not getting enough magnesium in your diet.  Malnutrition.  Problems with absorbing magnesium from the intestines.  Dehydration.  Alcohol abuse.  Vomiting.  Severe or chronic diarrhea.  Some medicines, including medicines that make you urinate more (diuretics).  Certain diseases, such as kidney disease, diabetes, celiac disease, and overactive thyroid. What are the signs or symptoms? Symptoms of this condition include:  Loss of appetite.  Nausea and vomiting.  Involuntary shaking or trembling of a body part (tremor).  Muscle weakness.  Tingling in the arms and legs.  Sudden tightening of muscles (muscle spasms).  Confusion.  Psychiatric issues, such as depression, irritability, or psychosis.  A feeling of fluttering of the heart.  Seizures. These symptoms are more severe if magnesium levels drop suddenly. How is this diagnosed? This condition may be diagnosed based on:  Your symptoms and medical history.  A physical exam.  Blood and urine tests. How is this treated? Treatment depends on the cause and the severity of the condition. It may be treated with:  A magnesium supplement. This can be taken in pill form. If the condition is severe, magnesium is usually given through an IV.  Changes to your diet. You may be directed to eat foods that have a lot of magnesium, such as green leafy vegetables, peas, beans, and nuts.  Stopping any intake of alcohol. Follow these instructions at home:      Make sure that your diet includes foods with magnesium. Foods that have a lot of magnesium in them  include: ? Green leafy vegetables, such as spinach and broccoli. ? Beans and peas. ? Nuts and seeds, such as almonds and sunflower seeds. ? Whole grains, such as whole grain bread and fortified cereals.  Take magnesium supplements if your health care provider tells you to do that. Take them as directed.  Take over-the-counter and prescription medicines only as told by your health care provider.  Have your magnesium levels monitored as told by your health care provider.  When you are active, drink fluids that contain electrolytes.  Avoid drinking alcohol.  Keep all follow-up visits as told by your health care provider. This is important. Contact a health care provider if:  You get worse instead of better.  Your symptoms return. Get help right away if you:  Develop severe muscle weakness.  Have trouble breathing.  Feel that your heart is racing. Summary  Hypomagnesemia is a condition in which the level of magnesium in the blood is low.  Hypomagnesemia can affect every organ in the body.  Treatment may include eating more foods that contain magnesium, taking magnesium supplements, and not drinking alcohol.  Have your magnesium levels monitored as told by your health care provider. This information is not intended to replace advice given to you by your health care provider. Make sure you discuss any questions you have with your health care provider. Document Revised: 12/13/2016 Document Reviewed: 12/02/2016 Elsevier Patient Education  2020 Elsevier Inc.  

## 2019-03-31 ENCOUNTER — Encounter: Payer: Self-pay | Admitting: Family Medicine

## 2019-04-05 ENCOUNTER — Encounter: Payer: Self-pay | Admitting: Family Medicine

## 2019-04-05 DIAGNOSIS — I4892 Unspecified atrial flutter: Secondary | ICD-10-CM

## 2019-04-07 ENCOUNTER — Encounter: Payer: Self-pay | Admitting: Cardiovascular Disease

## 2019-04-07 ENCOUNTER — Ambulatory Visit (INDEPENDENT_AMBULATORY_CARE_PROVIDER_SITE_OTHER): Payer: Medicare Other | Admitting: Cardiovascular Disease

## 2019-04-07 ENCOUNTER — Other Ambulatory Visit: Payer: Self-pay

## 2019-04-07 VITALS — BP 144/82 | HR 66 | Ht 66.5 in | Wt 192.8 lb

## 2019-04-07 DIAGNOSIS — I10 Essential (primary) hypertension: Secondary | ICD-10-CM

## 2019-04-07 DIAGNOSIS — F321 Major depressive disorder, single episode, moderate: Secondary | ICD-10-CM | POA: Diagnosis not present

## 2019-04-07 DIAGNOSIS — R002 Palpitations: Secondary | ICD-10-CM | POA: Diagnosis not present

## 2019-04-07 DIAGNOSIS — D351 Benign neoplasm of parathyroid gland: Secondary | ICD-10-CM | POA: Diagnosis not present

## 2019-04-07 NOTE — Progress Notes (Signed)
Cardiology Office Note    Date:  04/07/2019   ID:  Margaret Mitchell 1940/03/10, MRN [id number]  PCP:  Kristian Covey, MD  Cardiologist:  Nicki Guadalajara, MD   New cardiology evaluation referred through the courtesy of Dr. Evelena Peat for evaluation of palpitations.  History of Present Illness:  Margaret Mitchell is a 79 y.o. female who is followed by Dr. Evelena Peat.  Margaret Mitchell was recently seen by him and complained of occasional palpitations.  Margaret Mitchell is referred for evaluation.  Margaret Mitchell admits to having a longstanding history of hypertension and over the years has been treated with Lotrel 5/20.  In 2007, metoprolol was added to Margaret Mitchell medical regimen.  Recently, Margaret Mitchell had been maintained on 100 mg of metoprolol succinate in addition to Margaret Mitchell Lotrel 5/20.  On December 21, 2018, Margaret Mitchell husband unfortunately passed away.  Margaret Mitchell admits to being under some increased stress since that time.  Over the past several months, Margaret Mitchell has noticed isolated episodes of palpitations which seem to occur after Margaret Mitchell sits down and is at rest and notes occasional isolated skips.  Margaret Mitchell is unaware of any sustained episodes of increasing heart rate or palpitations.  Margaret Mitchell had been seen by Dr. Caryl Never and was started on sertraline following Margaret Mitchell husband's death to help with some depression and anxiety.  Margaret Mitchell admits to using caffeine 1 cup/day of coffee.  Margaret Mitchell does not eat chocolates.  Margaret Mitchell was recently reevaluated by Dr. Caryl Never and Margaret Mitchell dose of metoprolol succinate was reduced from 100 mg down to 50 mg.  Margaret Mitchell is unaware of any major benefit with this dose reduction.  However Margaret Mitchell denies any chest pain PND orthopnea presyncope or syncope.  Remotely Margaret Mitchell states that Margaret Mitchell had worn a monitor and nothing was picked up.  Review of Margaret Mitchell records reveals that in 2017 Margaret Mitchell had a 2D echo Doppler study which showed an EF of 60 to 65%.  There was mild focal basal hypertrophy of the septum.  Margaret Mitchell had normal diastolic parameters.  There was minimal  calcification to Margaret Mitchell aortic valve leaflets.  In February 2018 Margaret Mitchell underwent a [place] Myoview study which reviewed revealed normal perfusion without scar or ischemia and nuclear stress EF at 60%.  Thyroid function studies in June 2020 were normal.  Margaret Mitchell has not had recent laboratory.  Margaret Mitchell presents for evaluation.  Past Medical History:  Diagnosis Date  . Allergy, unspecified not elsewhere classified   . Anxiety   . Arthritis   . Cervical dysplasia    prior to hysterectomy  . GERD (gastroesophageal reflux disease)   . Headache(784.0)   . Hypertension   . IBS (irritable bowel syndrome)   . Osteopenia 12/2015   T score -1.1 FRAX 3.7%/0.4%  . Pelvic adhesions   . Pelvic pain in female   . Sinus infection   . Thyroid disease    Parathyroid cyst    Past Surgical History:  Procedure Laterality Date  . ABDOMINAL HYSTERECTOMY  1983   TAH/BSO for pelvic pain and adhesions  . APPENDECTOMY  1994  . CATARACT EXTRACTION Bilateral 2013  . COLPOSCOPY    . FOOT SURGERY Bilateral   . PARATHYROID ADENOMA REMOVED  02/1980  . ROTATOR CUFF REPAIR     X2  . SHOULDER INJECTION  10/2011  . TONSILLECTOMY    . TUBAL LIGATION  1981  . VESICOVAGINAL FISTULA CLOSURE W/ TAH      Current Medications: Outpatient Medications Prior to Visit  Medication Sig Dispense Refill  .  Cardiology Office Note    Date:  04/07/2019   ID:  Margaret, Mitchell October 23, 1940, MRN [id number]  PCP:  Eulas Post, MD  Cardiologist:  Shelva Majestic, MD   New cardiology evaluation referred through the courtesy of Dr. Carolann Littler for evaluation of palpitations.  History of Present Illness:  Margaret Mitchell is a 79 y.o. female who is followed by Dr. Carolann Littler.  Margaret Mitchell was recently seen by him and complained of occasional palpitations.  Margaret Mitchell is referred for evaluation.  Margaret Mitchell admits to having a longstanding history of hypertension and over the years has been treated with Lotrel 5/20.  In 2007, metoprolol was added to Margaret Mitchell medical regimen.  Recently, Margaret Mitchell had been maintained on 100 mg of metoprolol succinate in addition to Margaret Mitchell Lotrel 5/20.  On December 21, 2018, Margaret Mitchell husband unfortunately passed away.  Margaret Mitchell admits to being under some increased stress since that time.  Over the past several months, Margaret Mitchell has noticed isolated episodes of palpitations which seem to occur after Margaret Mitchell sits down and is at rest and notes occasional isolated skips.  Margaret Mitchell is unaware of any sustained episodes of increasing heart rate or palpitations.  Margaret Mitchell had been seen by Dr. Elease Hashimoto and was started on sertraline following Margaret Mitchell husband's death to help with some depression and anxiety.  Margaret Mitchell admits to using caffeine 1 cup/day of coffee.  Margaret Mitchell does not eat chocolates.  Margaret Mitchell was recently reevaluated by Dr. Elease Hashimoto and Margaret Mitchell dose of metoprolol succinate was reduced from 100 mg down to 50 mg.  Margaret Mitchell is unaware of any major benefit with this dose reduction.  However Margaret Mitchell denies any chest pain PND orthopnea presyncope or syncope.  Remotely Margaret Mitchell states that Margaret Mitchell had worn a monitor and nothing was picked up.  Review of Margaret Mitchell records reveals that in 2017 Margaret Mitchell had a 2D echo Doppler study which showed an EF of 60 to 65%.  There was mild focal basal hypertrophy of the septum.  Margaret Mitchell had normal diastolic parameters.  There was minimal  calcification to Margaret Mitchell aortic valve leaflets.  In February 2018 Margaret Mitchell underwent a [place] study which reviewed revealed normal perfusion without scar or ischemia and nuclear stress EF at 60%.  Thyroid function studies in June 2020 were normal.  Margaret Mitchell has not had recent laboratory.  Margaret Mitchell presents for evaluation.  Past Medical History:  Diagnosis Date  . Allergy, unspecified not elsewhere classified   . Anxiety   . Arthritis   . Cervical dysplasia    prior to hysterectomy  . GERD (gastroesophageal reflux disease)   . Headache(784.0)   . Hypertension   . IBS (irritable bowel syndrome)   . Osteopenia 12/2015   T score -1.1 FRAX 3.7%/0.4%  . Pelvic adhesions   . Pelvic pain in female   . Sinus infection   . Thyroid disease    Parathyroid cyst    Past Surgical History:  Procedure Laterality Date  . ABDOMINAL HYSTERECTOMY  1983   TAH/BSO for pelvic pain and adhesions  . APPENDECTOMY  1994  . CATARACT EXTRACTION Bilateral 2013  . COLPOSCOPY    . FOOT SURGERY Bilateral   . PARATHYROID ADENOMA REMOVED  02/1980  . ROTATOR CUFF REPAIR     X2  . SHOULDER INJECTION  10/2011  . TONSILLECTOMY    . TUBAL LIGATION  1981  . VESICOVAGINAL FISTULA CLOSURE W/ TAH      Current Medications: Outpatient Medications Prior to Visit  Medication Sig Dispense Refill  .  Cardiology Office Note    Date:  04/07/2019   ID:  Margaret, Mitchell October 23, 1940, MRN [id number]  PCP:  Eulas Post, MD  Cardiologist:  Shelva Majestic, MD   New cardiology evaluation referred through the courtesy of Dr. Carolann Littler for evaluation of palpitations.  History of Present Illness:  Margaret Mitchell is a 79 y.o. female who is followed by Dr. Carolann Littler.  Margaret Mitchell was recently seen by him and complained of occasional palpitations.  Margaret Mitchell is referred for evaluation.  Margaret Mitchell admits to having a longstanding history of hypertension and over the years has been treated with Lotrel 5/20.  In 2007, metoprolol was added to Margaret Mitchell medical regimen.  Recently, Margaret Mitchell had been maintained on 100 mg of metoprolol succinate in addition to Margaret Mitchell Lotrel 5/20.  On December 21, 2018, Margaret Mitchell husband unfortunately passed away.  Margaret Mitchell admits to being under some increased stress since that time.  Over the past several months, Margaret Mitchell has noticed isolated episodes of palpitations which seem to occur after Margaret Mitchell sits down and is at rest and notes occasional isolated skips.  Margaret Mitchell is unaware of any sustained episodes of increasing heart rate or palpitations.  Margaret Mitchell had been seen by Dr. Elease Hashimoto and was started on sertraline following Margaret Mitchell husband's death to help with some depression and anxiety.  Margaret Mitchell admits to using caffeine 1 cup/day of coffee.  Margaret Mitchell does not eat chocolates.  Margaret Mitchell was recently reevaluated by Dr. Elease Hashimoto and Margaret Mitchell dose of metoprolol succinate was reduced from 100 mg down to 50 mg.  Margaret Mitchell is unaware of any major benefit with this dose reduction.  However Margaret Mitchell denies any chest pain PND orthopnea presyncope or syncope.  Remotely Margaret Mitchell states that Margaret Mitchell had worn a monitor and nothing was picked up.  Review of Margaret Mitchell records reveals that in 2017 Margaret Mitchell had a 2D echo Doppler study which showed an EF of 60 to 65%.  There was mild focal basal hypertrophy of the septum.  Margaret Mitchell had normal diastolic parameters.  There was minimal  calcification to Margaret Mitchell aortic valve leaflets.  In February 2018 Margaret Mitchell underwent a [place] study which reviewed revealed normal perfusion without scar or ischemia and nuclear stress EF at 60%.  Thyroid function studies in June 2020 were normal.  Margaret Mitchell has not had recent laboratory.  Margaret Mitchell presents for evaluation.  Past Medical History:  Diagnosis Date  . Allergy, unspecified not elsewhere classified   . Anxiety   . Arthritis   . Cervical dysplasia    prior to hysterectomy  . GERD (gastroesophageal reflux disease)   . Headache(784.0)   . Hypertension   . IBS (irritable bowel syndrome)   . Osteopenia 12/2015   T score -1.1 FRAX 3.7%/0.4%  . Pelvic adhesions   . Pelvic pain in female   . Sinus infection   . Thyroid disease    Parathyroid cyst    Past Surgical History:  Procedure Laterality Date  . ABDOMINAL HYSTERECTOMY  1983   TAH/BSO for pelvic pain and adhesions  . APPENDECTOMY  1994  . CATARACT EXTRACTION Bilateral 2013  . COLPOSCOPY    . FOOT SURGERY Bilateral   . PARATHYROID ADENOMA REMOVED  02/1980  . ROTATOR CUFF REPAIR     X2  . SHOULDER INJECTION  10/2011  . TONSILLECTOMY    . TUBAL LIGATION  1981  . VESICOVAGINAL FISTULA CLOSURE W/ TAH      Current Medications: Outpatient Medications Prior to Visit  Medication Sig Dispense Refill  .  Cardiology Office Note    Date:  04/07/2019   ID:  Margaret, Mitchell October 23, 1940, MRN [id number]  PCP:  Eulas Post, MD  Cardiologist:  Shelva Majestic, MD   New cardiology evaluation referred through the courtesy of Dr. Carolann Littler for evaluation of palpitations.  History of Present Illness:  Margaret Mitchell is a 79 y.o. female who is followed by Dr. Carolann Littler.  Margaret Mitchell was recently seen by him and complained of occasional palpitations.  Margaret Mitchell is referred for evaluation.  Margaret Mitchell admits to having a longstanding history of hypertension and over the years has been treated with Lotrel 5/20.  In 2007, metoprolol was added to Margaret Mitchell medical regimen.  Recently, Margaret Mitchell had been maintained on 100 mg of metoprolol succinate in addition to Margaret Mitchell Lotrel 5/20.  On December 21, 2018, Margaret Mitchell husband unfortunately passed away.  Margaret Mitchell admits to being under some increased stress since that time.  Over the past several months, Margaret Mitchell has noticed isolated episodes of palpitations which seem to occur after Margaret Mitchell sits down and is at rest and notes occasional isolated skips.  Margaret Mitchell is unaware of any sustained episodes of increasing heart rate or palpitations.  Margaret Mitchell had been seen by Dr. Elease Hashimoto and was started on sertraline following Margaret Mitchell husband's death to help with some depression and anxiety.  Margaret Mitchell admits to using caffeine 1 cup/day of coffee.  Margaret Mitchell does not eat chocolates.  Margaret Mitchell was recently reevaluated by Dr. Elease Hashimoto and Margaret Mitchell dose of metoprolol succinate was reduced from 100 mg down to 50 mg.  Margaret Mitchell is unaware of any major benefit with this dose reduction.  However Margaret Mitchell denies any chest pain PND orthopnea presyncope or syncope.  Remotely Margaret Mitchell states that Margaret Mitchell had worn a monitor and nothing was picked up.  Review of Margaret Mitchell records reveals that in 2017 Margaret Mitchell had a 2D echo Doppler study which showed an EF of 60 to 65%.  There was mild focal basal hypertrophy of the septum.  Margaret Mitchell had normal diastolic parameters.  There was minimal  calcification to Margaret Mitchell aortic valve leaflets.  In February 2018 Margaret Mitchell underwent a [place] study which reviewed revealed normal perfusion without scar or ischemia and nuclear stress EF at 60%.  Thyroid function studies in June 2020 were normal.  Margaret Mitchell has not had recent laboratory.  Margaret Mitchell presents for evaluation.  Past Medical History:  Diagnosis Date  . Allergy, unspecified not elsewhere classified   . Anxiety   . Arthritis   . Cervical dysplasia    prior to hysterectomy  . GERD (gastroesophageal reflux disease)   . Headache(784.0)   . Hypertension   . IBS (irritable bowel syndrome)   . Osteopenia 12/2015   T score -1.1 FRAX 3.7%/0.4%  . Pelvic adhesions   . Pelvic pain in female   . Sinus infection   . Thyroid disease    Parathyroid cyst    Past Surgical History:  Procedure Laterality Date  . ABDOMINAL HYSTERECTOMY  1983   TAH/BSO for pelvic pain and adhesions  . APPENDECTOMY  1994  . CATARACT EXTRACTION Bilateral 2013  . COLPOSCOPY    . FOOT SURGERY Bilateral   . PARATHYROID ADENOMA REMOVED  02/1980  . ROTATOR CUFF REPAIR     X2  . SHOULDER INJECTION  10/2011  . TONSILLECTOMY    . TUBAL LIGATION  1981  . VESICOVAGINAL FISTULA CLOSURE W/ TAH      Current Medications: Outpatient Medications Prior to Visit  Medication Sig Dispense Refill  .  are listed in the Patient Instructions below. Patient Instructions  Medication Instructions:  CONTINUE WITH CURRENT MEDICATIONS. NO CHANGES. *If you need a refill on your cardiac medications before your next appointment, please call your pharmacy*   Lab Work: Fasting labs: TSH CMET CBC LIPID MAG If you have labs (blood work) drawn today and your tests are completely normal, you will receive your results  only by: Marland Kitchen MyChart Message (if you have MyChart) OR . A paper copy in the mail If you have any lab test that is abnormal or we need to change your treatment, we will call you to review the results.   Testing/Procedures: Your physician has requested that you have an echocardiogram. Echocardiography is a painless test that uses sound waves to create images of your heart. It provides your doctor with information about the size and shape of your heart and how well your heart's chambers and valves are working. This procedure takes approximately one hour. There are no restrictions for this procedure.  [address]   Follow-Up: At [facility], you and your health needs are our priority.  As part of our continuing mission to provide you with exceptional heart care, we have created designated Provider Care Teams.  These Care Teams include your primary Cardiologist (physician) and Advanced Practice Providers (APPs -  Physician Assistants and Nurse Practitioners) who all work together to provide you with the care you need, when you need it.  We recommend signing up for the patient portal called "MyChart".  Sign up information is provided on this After Visit Summary.  MyChart is used to connect with patients for Virtual Visits (Telemedicine).  Patients are able to view lab/test results, encounter notes, upcoming appointments, etc.  Non-urgent messages can be sent to your provider as well.   To learn more about what you can do with MyChart, go to [email or web address].    Your next appointment:   6 month(s)  The format for your next appointment:   In Person  Provider:   Nicki Guadalajara, MD       Signed, Nicki Guadalajara, MD  04/07/2019 12:11 PM    [facility] [address], [address], [place], [place]  [address] Phone: [phone]

## 2019-04-07 NOTE — Patient Instructions (Signed)
Medication Instructions:  CONTINUE WITH CURRENT MEDICATIONS. NO CHANGES. *If you need a refill on your cardiac medications before your next appointment, please call your pharmacy*   Lab Work: Fasting labs: TSH CMET CBC LIPID MAG If you have labs (blood work) drawn today and your tests are completely normal, you will receive your results only by: Marland Kitchen MyChart Message (if you have MyChart) OR . A paper copy in the mail If you have any lab test that is abnormal or we need to change your treatment, we will call you to review the results.   Testing/Procedures: Your physician has requested that you have an echocardiogram. Echocardiography is a painless test that uses sound waves to create images of your heart. It provides your doctor with information about the size and shape of your heart and how well your heart's chambers and valves are working. This procedure takes approximately one hour. There are no restrictions for this procedure.  [place]   Follow-Up: At [facility], you and your health needs are our priority.  As part of our continuing mission to provide you with exceptional heart care, we have created designated Provider Care Teams.  These Care Teams include your primary Cardiologist (physician) and Advanced Practice Providers (APPs -  Physician Assistants and Nurse Practitioners) who all work together to provide you with the care you need, when you need it.  We recommend signing up for the patient portal called "MyChart".  Sign up information is provided on this After Visit Summary.  MyChart is used to connect with patients for Virtual Visits (Telemedicine).  Patients are able to view lab/test results, encounter notes, upcoming appointments, etc.  Non-urgent messages can be sent to your provider as well.   To learn more about what you can do with MyChart, go to [email or web address].    Your next appointment:   6 month(s)  The format for your next appointment:   In  Person  Provider:   Shelva Majestic, MD

## 2019-04-08 DIAGNOSIS — I10 Essential (primary) hypertension: Secondary | ICD-10-CM | POA: Diagnosis not present

## 2019-04-08 DIAGNOSIS — R002 Palpitations: Secondary | ICD-10-CM | POA: Diagnosis not present

## 2019-04-08 LAB — COMPREHENSIVE METABOLIC PANEL
ALT: 16 IU/L (ref 0–32)
AST: 23 IU/L (ref 0–40)
Albumin/Globulin Ratio: 1.7 (ref 1.2–2.2)
Albumin: 4.3 g/dL (ref 3.7–4.7)
Alkaline Phosphatase: 80 IU/L (ref 39–117)
BUN/Creatinine Ratio: 15 (ref 12–28)
BUN: 13 mg/dL (ref 8–27)
Bilirubin Total: 0.4 mg/dL (ref 0.0–1.2)
CO2: 23 mmol/L (ref 20–29)
Calcium: 9.2 mg/dL (ref 8.7–10.3)
Chloride: 105 mmol/L (ref 96–106)
Creatinine, Ser: 0.88 mg/dL (ref 0.57–1.00)
GFR calc Af Amer: 74 mL/min/{1.73_m2} (ref >59)
GFR calc non Af Amer: 64 mL/min/{1.73_m2} (ref >59)
Globulin, Total: 2.6 g/dL (ref 1.5–4.5)
Glucose: 86 mg/dL (ref 65–99)
Potassium: 4.4 mmol/L (ref 3.5–5.2)
Sodium: 141 mmol/L (ref 134–144)
Total Protein: 6.9 g/dL (ref 6.0–8.5)

## 2019-04-08 LAB — LIPID PANEL
Chol/HDL Ratio: 2.5 ratio (ref 0.0–4.4)
Cholesterol, Total: 178 mg/dL (ref 100–199)
HDL: 72 mg/dL (ref >39)
LDL Chol Calc (NIH): 83 mg/dL (ref 0–99)
Triglycerides: 137 mg/dL (ref 0–149)
VLDL Cholesterol Cal: 23 mg/dL (ref 5–40)

## 2019-04-08 LAB — CBC
Hematocrit: 38.4 % (ref 34.0–46.6)
Hemoglobin: 13.2 g/dL (ref 11.1–15.9)
MCH: 31.5 pg (ref 26.6–33.0)
MCHC: 34.4 g/dL (ref 31.5–35.7)
MCV: 92 fL (ref 79–97)
Platelets: 298 10*3/uL (ref 150–450)
RBC: 4.19 x10E6/uL (ref 3.77–5.28)
RDW: 12.2 % (ref 11.7–15.4)
WBC: 7 10*3/uL (ref 3.4–10.8)

## 2019-04-08 LAB — MAGNESIUM: Magnesium: 1.8 mg/dL (ref 1.6–2.3)

## 2019-04-08 LAB — TSH: TSH: 2.82 u[IU]/mL (ref 0.450–4.500)

## 2019-04-20 ENCOUNTER — Other Ambulatory Visit: Payer: Self-pay

## 2019-04-20 ENCOUNTER — Ambulatory Visit (HOSPITAL_COMMUNITY): Payer: Medicare Other | Attending: Cardiovascular Disease

## 2019-04-20 DIAGNOSIS — I10 Essential (primary) hypertension: Secondary | ICD-10-CM | POA: Insufficient documentation

## 2019-04-20 DIAGNOSIS — R002 Palpitations: Secondary | ICD-10-CM | POA: Diagnosis not present

## 2019-04-25 ENCOUNTER — Encounter: Payer: Self-pay | Admitting: Family Medicine

## 2019-04-28 ENCOUNTER — Telehealth: Payer: Self-pay

## 2019-04-28 NOTE — Telephone Encounter (Signed)
Patient is most likely short of breath due to her grade 2 diastolic dysfunction.  She has normal systolic function with EF at 60 to 65%.  Try to improve aerobic capacity.  Maintain optimal blood pressure

## 2019-04-28 NOTE — Telephone Encounter (Signed)
Called and reviewed echo results with pt. Pt verbalized understanding. She is questioning as to why she is still having SOB. She states she will sit still and it still feels like she is running. Pt reports being tired. Denies chest pain. Notified I would let Dr.Kelly know and see what his recommendations are.  Per his last office visit it says to follow up in 6 mo. Pt verbalized understanding with no other questions at this time. Pt gave verbal approval for leaving a message on her VM. Will route to MD

## 2019-04-30 NOTE — Telephone Encounter (Signed)
Called and spoke with pt to review Dr.Kelly's advice on her SOB and why she may be having this. Pt verbalized understanding with increasing aerobic capacity. She states that she recently spoke to her grief therapist and that they thought this may be her body reacting to suppressing her grief with a recent loved one passing. But pt was very thankful for the call back and had no other questions at this time.

## 2019-06-04 ENCOUNTER — Other Ambulatory Visit: Payer: Self-pay

## 2019-06-07 ENCOUNTER — Ambulatory Visit (INDEPENDENT_AMBULATORY_CARE_PROVIDER_SITE_OTHER): Payer: Medicare Other | Admitting: Family Medicine

## 2019-06-07 ENCOUNTER — Other Ambulatory Visit: Payer: Self-pay

## 2019-06-07 ENCOUNTER — Encounter: Payer: Self-pay | Admitting: Family Medicine

## 2019-06-07 VITALS — BP 140/80 | HR 76 | Temp 98.5°F | Wt 195.3 lb

## 2019-06-07 DIAGNOSIS — R1013 Epigastric pain: Secondary | ICD-10-CM

## 2019-06-07 DIAGNOSIS — R002 Palpitations: Secondary | ICD-10-CM | POA: Diagnosis not present

## 2019-06-07 NOTE — Progress Notes (Signed)
Subjective:     Patient ID: Margaret Mitchell, female   DOB: 1940/07/14, 79 y.o.   MRN: [id number]  HPI   Margaret Mitchell is seen with some intermittent "fluttering" of the heart.  This has happened since around the first of the year.  No clear triggers.  Minimal caffeine use.  No alcohol use.  She went to cardiology and had echocardiogram which showed ejection fraction 60-65%.  This was back in April.  Results reviewed with patient.  No major valve abnormalities.  No wall motion abnormalities.  Recent labs per cardiology back in March reviewed.  Electrolytes and TSH normal..    Her husband passed away from progressive supranuclear palsy complications December 9 and she has still been having some difficulties adapting to that.  She remains on sertraline.  She has been relatively isolated from church and other contacts.  She takes Toprol XL 50 mg daily.  Denies any recent chest pains.  No dyspnea.  No orthopnea.  Symptoms are very intermittent  She also relates some epigastric burning over the past several weeks.  No appetite change.  She is had some mild weight gain.  She has tried Tums which provides temporary relief.  No melena.  No regular nonsteroidal use.  Past Medical History:  Diagnosis Date  . Allergy, unspecified not elsewhere classified   . Anxiety   . Arthritis   . Cervical dysplasia    prior to hysterectomy  . GERD (gastroesophageal reflux disease)   . Headache(784.0)   . Hypertension   . IBS (irritable bowel syndrome)   . Osteopenia 12/2015   T score -1.1 FRAX 3.7%/0.4%  . Pelvic adhesions   . Pelvic pain in female   . Sinus infection   . Thyroid disease    Parathyroid cyst   Past Surgical History:  Procedure Laterality Date  . ABDOMINAL HYSTERECTOMY  1983   TAH/BSO for pelvic pain and adhesions  . APPENDECTOMY  1994  . CATARACT EXTRACTION Bilateral 2013  . COLPOSCOPY    . FOOT SURGERY Bilateral   . PARATHYROID ADENOMA REMOVED  02/1980  . ROTATOR CUFF REPAIR     X2  .  SHOULDER INJECTION  10/2011  . TONSILLECTOMY    . TUBAL LIGATION  1981  . VESICOVAGINAL FISTULA CLOSURE W/ TAH      reports that she has never smoked. She has never used smokeless tobacco. She reports current alcohol use. She reports that she does not use drugs. family history includes Diabetes in her maternal aunt, maternal grandmother, and mother; Heart disease in her maternal grandmother; Heart failure in her paternal grandmother; Hypertension in her maternal grandmother; Leukemia in her brother; Lung cancer in her father; Stroke in her maternal grandmother. Allergies  Allergen Reactions  . Other     Seldane  . Prednisone     REACTION: heart palpatations  In larger doses  . Seldane [Terfenadine]     SELDANE  . Sulfa Antibiotics Other (See Comments)    REACTION: pt unable to remember REACTION: pt unable to remember  . Sulfasalazine Other (See Comments)    REACTION: pt unable to remember  . Sulfonamide Derivatives     REACTION: pt unable to remember  . Vioxx [Rofecoxib]     REACTION: mouth swelling (VIOXX)   Wt Readings from Last 3 Encounters:  06/07/19 195 lb 4.8 oz (88.6 kg)  04/07/19 192 lb 12.8 oz (87.5 kg)  03/16/19 193 lb 4 oz (87.7 kg)     Review of Systems  Constitutional:  Negative for appetite change, fatigue and unexpected weight change.  Eyes: Negative for visual disturbance.  Respiratory: Negative for cough, chest tightness, shortness of breath and wheezing.   Cardiovascular: Positive for palpitations. Negative for chest pain and leg swelling.  Gastrointestinal: Positive for abdominal pain. Negative for blood in stool, constipation, diarrhea, nausea and vomiting.  Neurological: Negative for dizziness, seizures, syncope, weakness, light-headedness and headaches.       Objective:   Physical Exam Constitutional:      Appearance: She is well-developed.  Eyes:     Pupils: Pupils are equal, round, and reactive to light.  Neck:     Thyroid: No thyromegaly.      Vascular: No JVD.  Cardiovascular:     Rate and Rhythm: Normal rate and regular rhythm.     Heart sounds: No gallop.   Pulmonary:     Effort: Pulmonary effort is normal. No respiratory distress.     Breath sounds: Normal breath sounds. No wheezing or rales.  Abdominal:     General: Bowel sounds are normal.     Palpations: Abdomen is soft. There is no mass.     Tenderness: There is no abdominal tenderness. There is no guarding or rebound.  Musculoskeletal:     Cervical back: Neck supple.  Neurological:     Mental Status: She is alert.        Assessment:     #1 palpitations.  She has ongoing palpitations in spite of Toprol but no worrisome symptoms such as major dizziness, syncope, chest pain.  Recent echo and EKG reviewed.  Question stress related  #2 epigastric burning.  Question gastritis.  She does not describe any red flags such as weight loss, melena, etc.    Plan:     -Recommend trial of Pepcid 20 mg twice daily and if abdominal pain not fully relieved in 1 to 2 weeks be in touch  -Continue to avoid caffeine.  Continue Toprol XL 50 mg daily   Kristian Covey MD [facility]

## 2019-06-07 NOTE — Patient Instructions (Signed)
Consider trial of Pepcid 20 mg twice .  Let me know if not improved in 1-2 weeks

## 2019-06-28 ENCOUNTER — Other Ambulatory Visit: Payer: Self-pay | Admitting: Family Medicine

## 2019-07-23 ENCOUNTER — Telehealth (INDEPENDENT_AMBULATORY_CARE_PROVIDER_SITE_OTHER): Payer: Medicare Other | Admitting: Family Medicine

## 2019-07-23 ENCOUNTER — Encounter: Payer: Self-pay | Admitting: Neurology

## 2019-07-23 DIAGNOSIS — I10 Essential (primary) hypertension: Secondary | ICD-10-CM | POA: Diagnosis not present

## 2019-07-23 DIAGNOSIS — G629 Polyneuropathy, unspecified: Secondary | ICD-10-CM

## 2019-07-23 NOTE — Progress Notes (Signed)
Patient ID: DAYLAN NAEF, female   DOB: 12-03-1940, 79 y.o.   MRN: [id number]   This visit type was conducted due to national recommendations for restrictions regarding the COVID-19 pandemic in an effort to limit this patient's exposure and mitigate transmission in our community.   Virtual Visit via Telephone Note  I connected with Margaret Mitchell on 07/23/19 at 10:00 AM EDT by telephone and verified that I am speaking with the correct person using two identifiers. We attempted video visit but had technical difficulties and has had to be converted to phone note.   I discussed the limitations, risks, security and privacy concerns of performing an evaluation and management service by telephone and the availability of in person appointments. I also discussed with the patient that there may be a patient responsible charge related to this service. The patient expressed understanding and agreed to proceed.  Location patient: home Location provider: work or home office Participants present for the call: patient, provider Patient did not have a visit in the prior 7 days to address this/these issue(s).   History of Present Illness: She has concerns for some progressive paresthesias.  She for several years has had intermittent neuropathy involving the feet.  She had EMG study back in December 2013 from previous neurologist which showed moderate sensory and motor demyelinating neuropathy.  She has had involvement limited to the feet for years but just in the past few weeks has started having some bilateral arm symptoms from proximal forearm down to the hand.    She has no history of diabetes.  Last B12 level was 4/17 which was normal.  Recent thyroid functions normal.  No alcohol use.  No recent reported appetite or weight changes.  No known heavy metal exposure.  She does also relate history of restless leg syndrome.  She states her feet frequently feel "cold ".  No major pain issues.  She has seen  neurology in the past but this was almost 7 years ago.  She has been on low-dose gabapentin in the past.  No hx of PVD.    She has hypertension treated with Lotrel and metoprolol.  She is on low-dose sertraline.  Husband passed away a year ago and she is coping fairly well.  She stays active with her church.  Past Medical History:  Diagnosis Date  . Allergy, unspecified not elsewhere classified   . Anxiety   . Arthritis   . Cervical dysplasia    prior to hysterectomy  . GERD (gastroesophageal reflux disease)   . Headache(784.0)   . Hypertension   . IBS (irritable bowel syndrome)   . Osteopenia 12/2015   T score -1.1 FRAX 3.7%/0.4%  . Pelvic adhesions   . Pelvic pain in female   . Sinus infection   . Thyroid disease    Parathyroid cyst   Past Surgical History:  Procedure Laterality Date  . ABDOMINAL HYSTERECTOMY  1983   TAH/BSO for pelvic pain and adhesions  . APPENDECTOMY  1994  . CATARACT EXTRACTION Bilateral 2013  . COLPOSCOPY    . FOOT SURGERY Bilateral   . PARATHYROID ADENOMA REMOVED  02/1980  . ROTATOR CUFF REPAIR     X2  . SHOULDER INJECTION  10/2011  . TONSILLECTOMY    . TUBAL LIGATION  1981  . VESICOVAGINAL FISTULA CLOSURE W/ TAH      reports that she has never smoked. She has never used smokeless tobacco. She reports current alcohol use. She reports that she does not use  drugs. family history includes Diabetes in her maternal aunt, maternal grandmother, and mother; Heart disease in her maternal grandmother; Heart failure in her paternal grandmother; Hypertension in her maternal grandmother; Leukemia in her brother; Lung cancer in her father; Stroke in her maternal grandmother. Allergies  Allergen Reactions  . Other     Seldane  . Prednisone     REACTION: heart palpatations  In larger doses  . Seldane [Terfenadine]     SELDANE  . Sulfa Antibiotics Other (See Comments)    REACTION: pt unable to remember REACTION: pt unable to remember  . Sulfasalazine Other  (See Comments)    REACTION: pt unable to remember  . Sulfonamide Derivatives     REACTION: pt unable to remember  . Vioxx [Rofecoxib]     REACTION: mouth swelling (VIOXX)      Observations/Objective: Patient sounds cheerful and well on the phone. I do not appreciate any SOB. Speech and thought processing are grossly intact. Patient reported vitals:  Assessment and Plan:  Patient presents with several year history of lower extremity neuropathy symptoms and recent onset of bilateral upper extremity symptoms.  Previous evaluations as above  -Set up referral to get back into see neurology -Consider over-the-counter B12 replacement until further evaluated  Follow Up Instructions:  -Setting up neurology evaluation as above   99441 5-10 [id number] 11-20 [id number] 21-30  I did not refer this patient for an OV in the next 24 hours for this/these issue(s).  I discussed the assessment and treatment plan with the patient. The patient was provided an opportunity to ask questions and all were answered. The patient agreed with the plan and demonstrated an understanding of the instructions.   The patient was advised to call back or seek an in-person evaluation if the symptoms worsen or if the condition fails to improve as anticipated.  I provided 26 minutes of non-face-to-face time during this encounter.   Evelena Peat, MD

## 2019-08-23 ENCOUNTER — Encounter: Payer: Self-pay | Admitting: Neurology

## 2019-08-23 ENCOUNTER — Other Ambulatory Visit: Payer: Self-pay

## 2019-08-23 ENCOUNTER — Ambulatory Visit (INDEPENDENT_AMBULATORY_CARE_PROVIDER_SITE_OTHER): Payer: Medicare Other | Admitting: Neurology

## 2019-08-23 VITALS — BP 126/84 | HR 74 | Resp 20 | Ht 66.5 in | Wt 199.0 lb

## 2019-08-23 DIAGNOSIS — G629 Polyneuropathy, unspecified: Secondary | ICD-10-CM | POA: Diagnosis not present

## 2019-08-23 DIAGNOSIS — G5622 Lesion of ulnar nerve, left upper limb: Secondary | ICD-10-CM

## 2019-08-23 NOTE — Patient Instructions (Addendum)
Try to avoid nerve compression around the elbow and minimize over bending  If you have worsening pain, you can try lidocaine ointment to your feet If symptoms get worse, please come back and see me

## 2019-08-23 NOTE — Progress Notes (Signed)
[facility] Clinic Note - Initial Visit   Date: 08/23/19  Margaret Mitchell MRN: [id number] DOB: April 23, 1940   Dear Dr. Caryl Never:  Thank you for your kind referral of Margaret Mitchell for consultation of neuropathy. Although her history is well known to you, please allow [name] to reiterate it for the purpose of our medical record. The patient was accompanied to the clinic by self.   History of Present Illness: Margaret Mitchell is a 79 y.o. right-handed female with hypertension presenting for evaluation of neuropathy.  She starting having numbness/tingling in the feet around 2016, which has progressed into her lower legs since March 2021.  Symptoms are constant without any exacerbating or alleviating factors. No imbalance.  She has no history of diabetes, alcohol abuse, chemotherapy, or family history of neuropathy.   Since March 2021, she has spells of numbness/tingling in the left arm from the elbow down into the hand.  Symptoms are usually present at rest and she attempts to shake the hand to get some relief. She denies weakness.  No falls and walks unassisted.   Out-side paper records, electronic medical record, and images have been reviewed where available and summarized as:  Lab Results  Component Value Date   HGBA1C 6.1 05/03/2015   Lab Results  Component Value Date   VITAMINB12 367 05/03/2015   Lab Results  Component Value Date   TSH 2.820 04/08/2019   Lab Results  Component Value Date   ESRSEDRATE 30 06/16/2018    Past Medical History:  Diagnosis Date  . Allergy, unspecified not elsewhere classified   . Anxiety   . Arthritis   . Cervical dysplasia    prior to hysterectomy  . GERD (gastroesophageal reflux disease)   . Headache(784.0)   . Hypertension   . IBS (irritable bowel syndrome)   . Osteopenia 12/2015   T score -1.1 FRAX 3.7%/0.4%  . Pelvic adhesions   . Pelvic pain in female   . Sinus infection   . Thyroid disease     Parathyroid cyst    Past Surgical History:  Procedure Laterality Date  . ABDOMINAL HYSTERECTOMY  1983   TAH/BSO for pelvic pain and adhesions  . APPENDECTOMY  1994  . CATARACT EXTRACTION Bilateral 2013  . COLPOSCOPY    . FOOT SURGERY Bilateral   . PARATHYROID ADENOMA REMOVED  02/1980  . ROTATOR CUFF REPAIR     X2  . SHOULDER INJECTION  10/2011  . TONSILLECTOMY    . TUBAL LIGATION  1981  . VESICOVAGINAL FISTULA CLOSURE W/ TAH       Medications:  Outpatient Encounter Medications as of 08/23/2019  Medication Sig  . amLODipine-benazepril (LOTREL) 5-20 MG capsule TAKE 1 CAPSULE DAILY  . aspirin 81 MG tablet Take 81 mg by mouth daily.    . metoprolol succinate (TOPROL XL) 100 MG 24 hr tablet TAKE 1 TABLET DAILY WITH OR IMMEDIATELY FOLLOWING A MEAL (Patient taking differently: Take 50 mg by mouth daily. TAKE one half tablet once daily.)  . Multiple Vitamin (MULTIVITAMIN) tablet Take 1 tablet by mouth daily.  Marland Kitchen gabapentin (NEURONTIN) 100 MG capsule Take 2 capsules (200 mg total) by mouth daily. TAKE 2 CAPSULES AT NIGHT  . sertraline (ZOLOFT) 100 MG tablet Take 1 tablet (100 mg total) by mouth daily. (Patient taking differently: Take 50 mg by mouth daily. Take one half tablet once daily.)   No facility-administered encounter medications on file as of 08/23/2019.    Allergies:  Allergies  Allergen  Reactions  . Other     Seldane  . Prednisone     REACTION: heart palpatations  In larger doses  . Seldane [Terfenadine]     SELDANE  . Sulfa Antibiotics Other (See Comments)    REACTION: pt unable to remember REACTION: pt unable to remember  . Sulfasalazine Other (See Comments)    REACTION: pt unable to remember  . Sulfonamide Derivatives     REACTION: pt unable to remember  . Vioxx [Rofecoxib]     REACTION: mouth swelling (VIOXX)    Family History: Family History  Problem Relation Age of Onset  . Diabetes Mother   . Lung cancer Father   . Leukemia Brother   . Hypertension  Maternal Grandmother   . Heart disease Maternal Grandmother   . Diabetes Maternal Grandmother   . Stroke Maternal Grandmother   . Diabetes Maternal Aunt   . Heart failure Paternal Grandmother     Social History: Social History   Tobacco Use  . Smoking status: Never Smoker  . Smokeless tobacco: Never Used  Vaping Use  . Vaping Use: Never used  Substance Use Topics  . Alcohol use: Yes    Comment: Rare  . Drug use: No   Social History   Social History Narrative   Lives with husband.   Retired from working Barrister's clerk at [employer]   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   Right handed   One story home    Vital Signs:  BP 126/84   Pulse 74   Resp 20   Ht 5' 6.5" (1.689 m)   Wt 199 lb (90.3 kg)   SpO2 99%   BMI 31.64 kg/m   Neurological Exam: MENTAL STATUS including orientation to time, place, person, recent and remote memory, attention span and concentration, language, and fund of knowledge is normal.  Speech is not dysarthric.  CRANIAL NERVES: II:  No visual field defects.    III-IV-VI: Pupils equal round and reactive.  Normal conjugate, extra-ocular eye movements in all directions of gaze.  No nystagmus.  No ptosis.   V:  Normal facial sensation.    VII:  Normal facial symmetry and movements.   VIII:  Normal hearing and vestibular function.   IX-X:  Normal palatal movement.   XI:  Normal shoulder shrug and head rotation.   XII:  Normal tongue strength and range of motion, no deviation or fasciculation.  MOTOR:  No atrophy, fasciculations or abnormal movements.  No pronator drift.   Upper Extremity:  Right  Left  Deltoid  5/5   5/5   Biceps  5/5   5/5   Triceps  5/5   5/5   Infraspinatus 5/5  5/5  Medial pectoralis 5/5  5/5  Wrist extensors  5/5   5/5   Wrist flexors  5/5   5/5   Finger extensors  5/5   5/5   Finger flexors  5/5   5/5   Dorsal interossei  5/5   5/5   Abductor pollicis  5/5   5/5   Tone (Ashworth scale)  0  0   Lower  Extremity:  Right  Left  Hip flexors  5/5   5/5   Hip extensors  5/5   5/5   Adductor 5/5  5/5  Abductor 5/5  5/5  Knee flexors  5/5   5/5   Knee extensors  5/5   5/5   Dorsiflexors  5/5   5/5   Plantarflexors  5/5  5/5   Toe extensors  5/5   5/5   Toe flexors  5/5   5/5   Tone (Ashworth scale)  0  0   MSRs:  Right        Left                  brachioradialis 2+  2+  biceps 2+  2+  triceps 2+  2+  patellar 2+  2+  ankle jerk 0  0  Hoffman no  no  plantar response down  down   SENSORY:  Reduced vibration at the ankles, absent at the toes. Temperature and pin prick reduced in feet.  Sensation in the hands is intact bilaterally.  Rhomberg testing is positive.  COORDINATION/GAIT: Normal finger-to- nose-finger.  Intact rapid alternating movements bilaterally. Gait narrow based and stable. Stressed intact.  Difficulty with tandem gait.    IMPRESSION: 1.  Idiopathic peripheral neuropathy, progressed into the lower legs, as expected with the disease course  - She has tried gabapentin the past which did not provide relief. Symptoms not painful enough to start another medication  - Patient educated on daily foot inspection, fall prevention, and safety precautions around the home.  2.  Left hand paresthesias, likely ulnar neuropathy  - Conservative therapies discussed such as using a soft elbow pad and minimizing elbow flexion  - NCS/EMG declined, consider if symptoms get worse  Return to clinic as needed  Thank you for allowing me to participate in patient's care.  If I can answer any additional questions, I would be pleased to do so.    Sincerely,    Disha Cottam K. Allena Katz, DO

## 2019-09-02 ENCOUNTER — Other Ambulatory Visit: Payer: Self-pay

## 2019-09-02 ENCOUNTER — Ambulatory Visit (INDEPENDENT_AMBULATORY_CARE_PROVIDER_SITE_OTHER): Payer: Medicare Other

## 2019-09-02 DIAGNOSIS — Z0000 Encounter for general adult medical examination without abnormal findings: Secondary | ICD-10-CM

## 2019-09-02 NOTE — Patient Instructions (Signed)
Margaret Mitchell , Thank you for taking time to come for your Medicare Wellness Visit. I appreciate your ongoing commitment to your health goals. Please review the following plan we discussed and let me know if I can assist you in the future.   Screening recommendations/referrals: Colonoscopy: No longer required  Mammogram: Up to date, next due 10/08/2019 Bone Density: up to date, next due 01/20/2021 Recommended yearly ophthalmology/optometry visit for glaucoma screening and checkup Recommended yearly dental visit for hygiene and checkup  Vaccinations: Influenza vaccine: Up to date, next due this fall 2021 Pneumococcal vaccine: Completed series  Tdap vaccine: Up to date, next due 10/14/2022 Shingles vaccine: Currently due, please contact your pharmacy to discuss cost and to receive at the pharmacy     Advanced directives: Copies on file   Conditions/risks identified: None   Next appointment: None    Preventive Care 65 Years and Older, Female Preventive care refers to lifestyle choices and visits with your health care provider that can promote health and wellness. What does preventive care include?  A yearly physical exam. This is also called an annual well check.  Dental exams once or twice a year.  Routine eye exams. Ask your health care provider how often you should have your eyes checked.  Personal lifestyle choices, including:  Daily care of your teeth and gums.  Regular physical activity.  Eating a healthy diet.  Avoiding tobacco and drug use.  Limiting alcohol use.  Practicing safe sex.  Taking low-dose aspirin every day.  Taking vitamin and mineral supplements as recommended by your health care provider. What happens during an annual well check? The services and screenings done by your health care provider during your annual well check will depend on your age, overall health, lifestyle risk factors, and family history of disease. Counseling  Your health care  provider may ask you questions about your:  Alcohol use.  Tobacco use.  Drug use.  Emotional well-being.  Home and relationship well-being.  Sexual activity.  Eating habits.  History of falls.  Memory and ability to understand (cognition).  Work and work Statistician.  Reproductive health. Screening  You may have the following tests or measurements:  Height, weight, and BMI.  Blood pressure.  Lipid and cholesterol levels. These may be checked every 5 years, or more frequently if you are over 71 years old.  Skin check.  Lung cancer screening. You may have this screening every year starting at age 92 if you have a 30-pack-year history of smoking and currently smoke or have quit within the past 15 years.  Fecal occult blood test (FOBT) of the stool. You may have this test every year starting at age 3.  Flexible sigmoidoscopy or colonoscopy. You may have a sigmoidoscopy every 5 years or a colonoscopy every 10 years starting at age 47.  Hepatitis C blood test.  Hepatitis B blood test.  Sexually transmitted disease (STD) testing.  Diabetes screening. This is done by checking your blood sugar (glucose) after you have not eaten for a while (fasting). You may have this done every 1-3 years.  Bone density scan. This is done to screen for osteoporosis. You may have this done starting at age 10.  Mammogram. This may be done every 1-2 years. Talk to your health care provider about how often you should have regular mammograms. Talk with your health care provider about your test results, treatment options, and if necessary, the need for more tests. Vaccines  Your health care provider may recommend  certain vaccines, such as:  Influenza vaccine. This is recommended every year.  Tetanus, diphtheria, and acellular pertussis (Tdap, Td) vaccine. You may need a Td booster every 10 years.  Zoster vaccine. You may need this after age 23.  Pneumococcal 13-valent conjugate (PCV13)  vaccine. One dose is recommended after age 70.  Pneumococcal polysaccharide (PPSV23) vaccine. One dose is recommended after age 30. Talk to your health care provider about which screenings and vaccines you need and how often you need them. This information is not intended to replace advice given to you by your health care provider. Make sure you discuss any questions you have with your health care provider. Document Released: 01/27/2015 Document Revised: 09/20/2015 Document Reviewed: 11/01/2014 Elsevier Interactive Patient Education  2017 [place] Prevention in the Home Falls can cause injuries. They can happen to people of all ages. There are many things you can do to make your home safe and to help prevent falls. What can I do on the outside of my home?  Regularly fix the edges of walkways and driveways and fix any cracks.  Remove anything that might make you trip as you walk through a door, such as a raised step or threshold.  Trim any bushes or trees on the path to your home.  Use bright outdoor lighting.  Clear any walking paths of anything that might make someone trip, such as rocks or tools.  Regularly check to see if handrails are loose or broken. Make sure that both sides of any steps have handrails.  Any raised decks and porches should have guardrails on the edges.  Have any leaves, snow, or ice cleared regularly.  Use sand or salt on walking paths during winter.  Clean up any spills in your garage right away. This includes oil or grease spills. What can I do in the bathroom?  Use night lights.  Install grab bars by the toilet and in the tub and shower. Do not use towel bars as grab bars.  Use non-skid mats or decals in the tub or shower.  If you need to sit down in the shower, use a plastic, non-slip stool.  Keep the floor dry. Clean up any water that spills on the floor as soon as it happens.  Remove soap buildup in the tub or shower  regularly.  Attach bath mats securely with double-sided non-slip rug tape.  Do not have throw rugs and other things on the floor that can make you trip. What can I do in the bedroom?  Use night lights.  Make sure that you have a light by your bed that is easy to reach.  Do not use any sheets or blankets that are too big for your bed. They should not hang down onto the floor.  Have a firm chair that has side arms. You can use this for support while you get dressed.  Do not have throw rugs and other things on the floor that can make you trip. What can I do in the kitchen?  Clean up any spills right away.  Avoid walking on wet floors.  Keep items that you use a lot in easy-to-reach places.  If you need to reach something above you, use a strong step stool that has a grab bar.  Keep electrical cords out of the way.  Do not use floor polish or wax that makes floors slippery. If you must use wax, use non-skid floor wax.  Do not have throw rugs and other  things on the floor that can make you trip. What can I do with my stairs?  Do not leave any items on the stairs.  Make sure that there are handrails on both sides of the stairs and use them. Fix handrails that are broken or loose. Make sure that handrails are as long as the stairways.  Check any carpeting to make sure that it is firmly attached to the stairs. Fix any carpet that is loose or worn.  Avoid having throw rugs at the top or bottom of the stairs. If you do have throw rugs, attach them to the floor with carpet tape.  Make sure that you have a light switch at the top of the stairs and the bottom of the stairs. If you do not have them, ask someone to add them for you. What else can I do to help prevent falls?  Wear shoes that:  Do not have high heels.  Have rubber bottoms.  Are comfortable and fit you well.  Are closed at the toe. Do not wear sandals.  If you use a stepladder:  Make sure that it is fully  opened. Do not climb a closed stepladder.  Make sure that both sides of the stepladder are locked into place.  Ask someone to hold it for you, if possible.  Clearly mark and make sure that you can see:  Any grab bars or handrails.  First and last steps.  Where the edge of each step is.  Use tools that help you move around (mobility aids) if they are needed. These include:  Canes.  Walkers.  Scooters.  Crutches.  Turn on the lights when you go into a dark area. Replace any light bulbs as soon as they burn out.  Set up your furniture so you have a clear path. Avoid moving your furniture around.  If any of your floors are uneven, fix them.  If there are any pets around you, be aware of where they are.  Review your medicines with your doctor. Some medicines can make you feel dizzy. This can increase your chance of falling. Ask your doctor what other things that you can do to help prevent falls. This information is not intended to replace advice given to you by your health care provider. Make sure you discuss any questions you have with your health care provider. Document Released: 10/27/2008 Document Revised: 06/08/2015 Document Reviewed: 02/04/2014 Elsevier Interactive Patient Education  2017 [employer].

## 2019-09-02 NOTE — Progress Notes (Signed)
Subjective:   Margaret Mitchell is a 79 y.o. female who presents for Medicare Annual (Subsequent) preventive examination.  I connected with Raynelle Chary today by telephone and verified that I am speaking with the correct person using two identifiers. Location patient: home Location provider: work Persons participating in the virtual visit: patient, provider.   I discussed the limitations, risks, security and privacy concerns of performing an evaluation and management service by telephone and the availability of in person appointments. I also discussed with the patient that there may be a patient responsible charge related to this service. The patient expressed understanding and verbally consented to this telephonic visit.    Interactive audio and video telecommunications were attempted between this provider and patient, however failed, due to patient having technical difficulties OR patient did not have access to video capability.  We continued and completed visit with audio only.      Review of Systems    N/A  Cardiac Risk Factors include: advanced age (>68men, >48 women);dyslipidemia;hypertension     Objective:    Today's Vitals   09/02/19 1030  PainSc: 5    There is no height or weight on file to calculate BMI.  Advanced Directives 09/02/2019 08/23/2019 06/06/2017 02/07/2016 02/02/2015 12/29/2013  Does Patient Have a Medical Advance Directive? Yes Yes Yes Yes No No  Type of Paramedic of [place];Living will - - - - -  Does patient want to make changes to medical advance directive? No - Patient declined - - - - -  Copy of [place] in Chart? Yes - validated most recent copy scanned in chart (See row information) - - - - -  Would patient like information on creating a medical advance directive? - - - - No - patient declined information No - patient declined information    Current Medications (verified) Outpatient Encounter  Medications as of 09/02/2019  Medication Sig  . amLODipine-benazepril (LOTREL) 5-20 MG capsule TAKE 1 CAPSULE DAILY  . aspirin 81 MG tablet Take 81 mg by mouth daily.    . metoprolol succinate (TOPROL XL) 100 MG 24 hr tablet TAKE 1 TABLET DAILY WITH OR IMMEDIATELY FOLLOWING A MEAL (Patient taking differently: Take 50 mg by mouth daily. TAKE one half tablet once daily.)  . Multiple Vitamin (MULTIVITAMIN) tablet Take 1 tablet by mouth daily.  . [DISCONTINUED] sertraline (ZOLOFT) 100 MG tablet Take 1 tablet (100 mg total) by mouth daily. (Patient taking differently: Take 50 mg by mouth daily. Take one half tablet once daily.)   No facility-administered encounter medications on file as of 09/02/2019.    Allergies (verified) Other, Prednisone, Seldane [terfenadine], Sulfa antibiotics, Sulfasalazine, Sulfonamide derivatives, and Vioxx [rofecoxib]   History: Past Medical History:  Diagnosis Date  . Allergy, unspecified not elsewhere classified   . Anxiety   . Arthritis   . Cervical dysplasia    prior to hysterectomy  . GERD (gastroesophageal reflux disease)   . Headache(784.0)   . Hypertension   . IBS (irritable bowel syndrome)   . Osteopenia 12/2015   T score -1.1 FRAX 3.7%/0.4%  . Pelvic adhesions   . Pelvic pain in female   . Sinus infection   . Thyroid disease    Parathyroid cyst   Past Surgical History:  Procedure Laterality Date  . ABDOMINAL HYSTERECTOMY  1983   TAH/BSO for pelvic pain and adhesions  . APPENDECTOMY  1994  . CATARACT EXTRACTION Bilateral 2013  . COLPOSCOPY    . FOOT  SURGERY Bilateral   . PARATHYROID ADENOMA REMOVED  02/1980  . ROTATOR CUFF REPAIR     X2  . SHOULDER INJECTION  10/2011  . TONSILLECTOMY    . TUBAL LIGATION  1981  . VESICOVAGINAL FISTULA CLOSURE W/ TAH     Family History  Problem Relation Age of Onset  . Diabetes Mother   . Lung cancer Father   . Leukemia Brother   . Hypertension Maternal Grandmother   . Heart disease Maternal  Grandmother   . Diabetes Maternal Grandmother   . Stroke Maternal Grandmother   . Diabetes Maternal Aunt   . Heart failure Paternal Grandmother    Social History   Socioeconomic History  . Marital status: Widowed    Spouse name: Laddie Aquas  . Number of children: 1  . Years of education: Not on file  . Highest education level: Not on file  Occupational History  . Occupation: retired    Fish farm manager: RETIRED  Tobacco Use  . Smoking status: Never Smoker  . Smokeless tobacco: Never Used  Vaping Use  . Vaping Use: Never used  Substance and Sexual Activity  . Alcohol use: Yes    Comment: Rare  . Drug use: No  . Sexual activity: Never    Birth control/protection: Surgical    Comment: 1st intercourse 79 yo-Fewer than 5 partners  Other Topics Concern  . Not on file  Social History Narrative   Lives with husband.   Retired from working Surveyor, mining at [employer].   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   Right handed   One story home   Social Determinants of Health   Financial Resource Strain: Low Risk   . Difficulty of Paying Living Expenses: Not hard at all  Food Insecurity: No Food Insecurity  . Worried About Charity fundraiser in the Last Year: Never true  . Ran Out of Food in the Last Year: Never true  Transportation Needs: No Transportation Needs  . Lack of Transportation (Medical): No  . Lack of Transportation (Non-Medical): No  Physical Activity: Inactive  . Days of Exercise per Week: 0 days  . Minutes of Exercise per Session: 0 min  Stress: No Stress Concern Present  . Feeling of Stress : Not at all  Social Connections: Moderately Isolated  . Frequency of Communication with Friends and Family: More than three times a week  . Frequency of Social Gatherings with Friends and Family: More than three times a week  . Attends Religious Services: More than 4 times per year  . Active Member of Clubs or Organizations: No  . Attends Archivist Meetings:  Never  . Marital Status: Widowed    Tobacco Counseling Counseling given: Not Answered   Clinical Intake:  Pre-visit preparation completed: Yes  Pain : 0-10 Pain Score: 5  Pain Type: Chronic pain Pain Location: Ear Pain Orientation: Left Pain Radiating Towards: Jaw Pain Descriptors / Indicators:  (deep pain) Pain Onset: More than a month ago Pain Frequency: Intermittent Pain Relieving Factors: No relieving factors  Pain Relieving Factors: No relieving factors  Nutritional Risks: None Diabetes: No  How often do you need to have someone help you when you read instructions, pamphlets, or other written materials from your doctor or pharmacy?: 1 - Never What is the last grade level you completed in school?: Bachelors Degree, and AAS degree in Accounting  Diabetic?No  Interpreter Needed?: No  Information entered by :: [place] of Daily Living In  your present state of health, do you have any difficulty performing the following activities: 09/02/2019  Hearing? N  Vision? N  Difficulty concentrating or making decisions? N  Walking or climbing stairs? N  Dressing or bathing? N  Doing errands, shopping? N  Preparing Food and eating ? N  Using the Toilet? N  In the past six months, have you accidently leaked urine? N  Do you have problems with loss of bowel control? N  Managing your Medications? N  Managing your Finances? N  Housekeeping or managing your Housekeeping? N  Some recent data might be hidden    Patient Care Team: Eulas Post, MD as PCP - General (Family Medicine)  Indicate any recent Medical Services you may have received from other than [facility] providers in the past year (date may be approximate).     Assessment:   This is a routine wellness examination for Danay.  Hearing/Vision screen  Hearing Screening   125Hz  250Hz  500Hz  1000Hz  2000Hz  3000Hz  4000Hz  6000Hz  8000Hz   Right ear:           Left ear:           Vision Screening  Comments: Patient states gets annual eye exams  Dietary issues and exercise activities discussed: Current Exercise Habits: The patient does not participate in regular exercise at present  Goals    . Exercise 150 minutes per week (moderate activity)     Plan to walk on treadmill 20 minutes and one mile per day x 6 days     . Weight (lb) < 160 lb (72.6 kg)      Depression Screen PHQ 2/9 Scores 09/02/2019 06/06/2017 01/15/2017 01/15/2017 02/07/2016 12/01/2014 06/15/2013  PHQ - 2 Score 0 0 1 1 0 0 0  PHQ- 9 Score 0 - 1 - - - -    Fall Risk Fall Risk  09/02/2019 08/23/2019 08/17/2018 06/06/2017 02/07/2016  Falls in the past year? 1 0 1 No No  Comment - - Emmi Telephone Survey: data to providers prior to load - tripped quite a bit;  will slow down  Number falls in past yr: 0 0 1 - -  Comment Tripped off the step stool and fell into the floor - Emmi Telephone Survey Actual Response = 1 - -  Injury with Fall? 0 0 0 - -  Risk for fall due to : Medication side effect - - - -  Follow up Falls evaluation completed;Falls prevention discussed - - - -    Any stairs in or around the home? No  If so, are there any without handrails? No  Home free of loose throw rugs in walkways, pet beds, electrical cords, etc? Yes  Adequate lighting in your home to reduce risk of falls? Yes   ASSISTIVE DEVICES UTILIZED TO PREVENT FALLS:  Life alert? Yes  Use of a cane, walker or w/c? No  Grab bars in the bathroom? Yes  Shower chair or bench in shower? No  Elevated toilet seat or a handicapped toilet? Yes     Cognitive Function: MMSE - Mini Mental State Exam 06/06/2017 02/07/2016  Not completed: (No Data) (No Data)     6CIT Screen 09/02/2019  What Year? 0 points  What month? 0 points  What time? 0 points  Count back from 20 0 points  Months in reverse 0 points  Repeat phrase 0 points  Total Score 0    Immunizations Immunization History  Administered Date(s) Administered  . Fluad Quad(high Dose 65+)  10/01/2018  . Influenza Split 11/13/2010, 11/12/2011  . Influenza Whole 10/27/2007, 11/08/2008, 12/08/2009  . Influenza, High Dose Seasonal PF 10/09/2013, 10/28/2014, 09/28/2015, 10/07/2016, 10/17/2017  . Influenza,inj,Quad PF,6+ Mos 09/30/2012  . PFIZER SARS-COV-2 Vaccination 01/26/2019, 02/13/2019  . Pneumococcal Conjugate-13 05/25/2013  . Pneumococcal Polysaccharide-23 05/08/2009  . Tdap 10/13/2012  . Zoster 01/15/2011    TDAP status: Up to date Flu Vaccine status: Up to date Pneumococcal vaccine status: Up to date Covid-19 vaccine status: Completed vaccines  Qualifies for Shingles Vaccine? Yes   Zostavax completed Yes   Shingrix Completed?: No.    Education has been provided regarding the importance of this vaccine. Patient has been advised to call insurance company to determine out of pocket expense if they have not yet received this vaccine. Advised may also receive vaccine at local pharmacy or Health Dept. Verbalized acceptance and understanding.  Screening Tests Health Maintenance  Topic Date Due  . Hepatitis C Screening  Never done  . INFLUENZA VACCINE  08/15/2019  . TETANUS/TDAP  10/14/2022  . DEXA SCAN  Completed  . COVID-19 Vaccine  Completed  . PNA vac Low Risk Adult  Completed    Health Maintenance  Health Maintenance Due  Topic Date Due  . Hepatitis C Screening  Never done  . INFLUENZA VACCINE  08/15/2019    Colorectal cancer screening: Completed 01/23/2015. Repeat every 0 years Mammogram status: Completed 10/08/2018. Repeat every year Bone Density status: Completed 01/21/2019. Results reflect: Bone density results: OSTEOPENIA. Repeat every 2 years.  Lung Cancer Screening: (Low Dose CT Chest recommended if Age 46-80 years, 30 pack-year currently smoking OR have quit w/in 15years.) does not qualify.   Lung Cancer Screening Referral: N/A   Additional Screening:  Hepatitis C Screening: does not qualify;   Vision Screening: Recommended annual  ophthalmology exams for early detection of glaucoma and other disorders of the eye. Is the patient up to date with their annual eye exam?  Yes  Who is the provider or what is the name of the office in which the patient attends annual eye exams? Dr. Vertell Limber  If pt is not established with a provider, would they like to be referred to a provider to establish care? No .   Dental Screening: Recommended annual dental exams for proper oral hygiene  Community Resource Referral / Chronic Care Management: CRR required this visit?  No   CCM required this visit?  No      Plan:     I have personally reviewed and noted the following in the patient's chart:   . Medical and social history . Use of alcohol, tobacco or illicit drugs  . Current medications and supplements . Functional ability and status . Nutritional status . Physical activity . Advanced directives . List of other physicians . Hospitalizations, surgeries, and ER visits in previous 12 months . Vitals . Screenings to include cognitive, depression, and falls . Referrals and appointments  In addition, I have reviewed and discussed with patient certain preventive protocols, quality metrics, and best practice recommendations. A written personalized care plan for preventive services as well as general preventive health recommendations were provided to patient.     Ofilia Neas, LPN   07/09/6387   Nurse Notes: None

## 2019-09-03 ENCOUNTER — Encounter: Payer: Self-pay | Admitting: Family Medicine

## 2019-09-14 ENCOUNTER — Encounter: Payer: Self-pay | Admitting: Family Medicine

## 2019-09-17 NOTE — Telephone Encounter (Signed)
Please advise 

## 2019-09-17 NOTE — Telephone Encounter (Signed)
Ok for 50 mg tb please advise

## 2019-09-21 MED ORDER — METOPROLOL SUCCINATE ER 50 MG PO TB24: 50.0000 mg | ORAL_TABLET | Freq: Every day | ORAL | 1 refills | Status: DC

## 2019-09-25 ENCOUNTER — Encounter: Payer: Self-pay | Admitting: Family Medicine

## 2019-09-27 DIAGNOSIS — M2669 Other specified disorders of temporomandibular joint: Secondary | ICD-10-CM | POA: Diagnosis not present

## 2019-09-27 DIAGNOSIS — H905 Unspecified sensorineural hearing loss: Secondary | ICD-10-CM | POA: Diagnosis not present

## 2019-09-27 DIAGNOSIS — H9311 Tinnitus, right ear: Secondary | ICD-10-CM | POA: Diagnosis not present

## 2019-10-08 DIAGNOSIS — Z1231 Encounter for screening mammogram for malignant neoplasm of breast: Secondary | ICD-10-CM | POA: Diagnosis not present

## 2019-10-11 ENCOUNTER — Other Ambulatory Visit: Payer: Self-pay

## 2019-10-12 ENCOUNTER — Encounter: Payer: Self-pay | Admitting: Gynecology

## 2019-10-12 ENCOUNTER — Encounter: Payer: Self-pay | Admitting: Family Medicine

## 2019-10-12 ENCOUNTER — Ambulatory Visit (INDEPENDENT_AMBULATORY_CARE_PROVIDER_SITE_OTHER): Payer: Medicare Other | Admitting: Family Medicine

## 2019-10-12 VITALS — BP 140/80 | HR 70 | Temp 98.2°F | Ht 66.5 in | Wt 198.5 lb

## 2019-10-12 DIAGNOSIS — I10 Essential (primary) hypertension: Secondary | ICD-10-CM

## 2019-10-12 DIAGNOSIS — R4589 Other symptoms and signs involving emotional state: Secondary | ICD-10-CM | POA: Diagnosis not present

## 2019-10-12 DIAGNOSIS — Z23 Encounter for immunization: Secondary | ICD-10-CM

## 2019-10-12 DIAGNOSIS — R5381 Other malaise: Secondary | ICD-10-CM

## 2019-10-12 NOTE — Progress Notes (Signed)
Established Patient Office Visit  Subjective:  Patient ID: Margaret Mitchell, female    DOB: 02-28-1940  Age: 79 y.o. MRN: [id number]  CC:  Chief Complaint  Patient presents with  . Tachycardia    Not feeling well, feels like someone scared her    HPI AHMIYA BAUDER presents for nonspecific symptoms of "not feeling well".  Her husband died of complications of progressive supranuclear palsy last year.  She has had some difficulties coping and adjusting since then.  She does have an active church family and still gets out and goes.  She does have some depressed mood.  We at one point has started antidepressant medication but she stopped this.  She feels that things are relatively stable.  She does have sad mood frequently.  Has not lost interest in activities.  She has some nonspecific fatigue.  She had multiple labs done back in March including thyroid, CBC, chemistries which were all basically unremarkable.  She does relate recently she has had some sensation of pressure in her chest at times.  Not consistently related activity.  No regular exercise.  She had echocardiogram last April which was basically unremarkable.  She had nuclear stress test 2018 with low risk study.  She does have some insomnia and thinks that may be contributing some to the fatigue.  No alcohol use.  No regular caffeine use.  She does read from iPad at night and knows the bright lights are probably not beneficial.  She does have some sad mood but denies any suicidal ideation.  Past Medical History:  Diagnosis Date  . Allergy, unspecified not elsewhere classified   . Anxiety   . Arthritis   . Cervical dysplasia    prior to hysterectomy  . GERD (gastroesophageal reflux disease)   . Headache(784.0)   . Hypertension   . IBS (irritable bowel syndrome)   . Osteopenia 12/2015   T score -1.1 FRAX 3.7%/0.4%  . Pelvic adhesions   . Pelvic pain in female   . Sinus infection   . Thyroid disease    Parathyroid cyst      Past Surgical History:  Procedure Laterality Date  . ABDOMINAL HYSTERECTOMY  1983   TAH/BSO for pelvic pain and adhesions  . APPENDECTOMY  1994  . CATARACT EXTRACTION Bilateral 2013  . COLPOSCOPY    . FOOT SURGERY Bilateral   . PARATHYROID ADENOMA REMOVED  02/1980  . ROTATOR CUFF REPAIR     X2  . SHOULDER INJECTION  10/2011  . TONSILLECTOMY    . TUBAL LIGATION  1981  . VESICOVAGINAL FISTULA CLOSURE W/ TAH      Family History  Problem Relation Age of Onset  . Diabetes Mother   . Lung cancer Father   . Leukemia Brother   . Hypertension Maternal Grandmother   . Heart disease Maternal Grandmother   . Diabetes Maternal Grandmother   . Stroke Maternal Grandmother   . Diabetes Maternal Aunt   . Heart failure Paternal Grandmother     Social History   Socioeconomic History  . Marital status: Widowed    Spouse name: Charlann Noss  . Number of children: 1  . Years of education: Not on file  . Highest education level: Not on file  Occupational History  . Occupation: retired    Associate Professor: RETIRED  Tobacco Use  . Smoking status: Never Smoker  . Smokeless tobacco: Never Used  Vaping Use  . Vaping Use: Never used  Substance and Sexual Activity  .  Alcohol use: Yes    Comment: Rare  . Drug use: No  . Sexual activity: Never    Birth control/protection: Surgical    Comment: 1st intercourse 79 yo-Fewer than 5 partners  Other Topics Concern  . Not on file  Social History Narrative   Lives with husband.   Retired from working Barrister's clerk at [employer]   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   Right handed   One story home   Social Determinants of Health   Financial Resource Strain: Low Risk   . Difficulty of Paying Living Expenses: Not hard at all  Food Insecurity: No Food Insecurity  . Worried About Programme researcher, broadcasting/film/video in the Last Year: Never true  . Ran Out of Food in the Last Year: Never true  Transportation Needs: No Transportation Needs  . Lack of  Transportation (Medical): No  . Lack of Transportation (Non-Medical): No  Physical Activity: Inactive  . Days of Exercise per Week: 0 days  . Minutes of Exercise per Session: 0 min  Stress: No Stress Concern Present  . Feeling of Stress : Not at all  Social Connections: Moderately Isolated  . Frequency of Communication with Friends and Family: More than three times a week  . Frequency of Social Gatherings with Friends and Family: More than three times a week  . Attends Religious Services: More than 4 times per year  . Active Member of Clubs or Organizations: No  . Attends Banker Meetings: Never  . Marital Status: Widowed  Intimate Partner Violence: Not At Risk  . Fear of Current or Ex-Partner: No  . Emotionally Abused: No  . Physically Abused: No  . Sexually Abused: No    Outpatient Medications Prior to Visit  Medication Sig Dispense Refill  . amLODipine-benazepril (LOTREL) 5-20 MG capsule TAKE 1 CAPSULE DAILY 90 capsule 3  . aspirin 81 MG tablet Take 81 mg by mouth daily.      . metoprolol succinate (TOPROL XL) 50 MG 24 hr tablet Take 1 tablet (50 mg total) by mouth daily. Take with or immediately following a meal. 90 tablet 1  . Multiple Vitamin (MULTIVITAMIN) tablet Take 1 tablet by mouth daily.     No facility-administered medications prior to visit.    Allergies  Allergen Reactions  . Other     Seldane  . Prednisone     REACTION: heart palpatations  In larger doses  . Seldane [Terfenadine]     SELDANE  . Sulfa Antibiotics Other (See Comments)    REACTION: pt unable to remember REACTION: pt unable to remember  . Sulfasalazine Other (See Comments)    REACTION: pt unable to remember  . Sulfonamide Derivatives     REACTION: pt unable to remember  . Vioxx [Rofecoxib]     REACTION: mouth swelling (VIOXX)    ROS Review of Systems  Constitutional: Positive for fatigue.  Respiratory: Negative for cough and shortness of breath.   Cardiovascular: Negative  for palpitations and leg swelling.  Gastrointestinal: Negative for abdominal pain.  Genitourinary: Negative for dysuria.  Psychiatric/Behavioral: Positive for dysphoric mood and sleep disturbance. Negative for suicidal ideas. The patient is not nervous/anxious and is not hyperactive.       Objective:    Physical Exam Constitutional:      Appearance: She is well-developed.  Eyes:     Pupils: Pupils are equal, round, and reactive to light.  Neck:     Thyroid: No thyromegaly.  Vascular: No JVD.  Cardiovascular:     Rate and Rhythm: Normal rate and regular rhythm.     Heart sounds: No gallop.   Pulmonary:     Effort: Pulmonary effort is normal. No respiratory distress.     Breath sounds: Normal breath sounds. No wheezing or rales.  Musculoskeletal:     Cervical back: Neck supple.  Neurological:     Mental Status: She is alert.  Psychiatric:     Comments: PHQ-9 equals 6     BP 140/80   Pulse 70   Temp 98.2 F (36.8 C) (Oral)   Ht 5' 6.5" (1.689 m)   Wt 198 lb 8 oz (90 kg)   SpO2 97%   BMI 31.56 kg/m  Wt Readings from Last 3 Encounters:  10/12/19 198 lb 8 oz (90 kg)  08/23/19 199 lb (90.3 kg)  06/07/19 195 lb 4.8 oz (88.6 kg)     Health Maintenance Due  Topic Date Due  . Hepatitis C Screening  Never done    There are no preventive care reminders to display for this patient.  Lab Results  Component Value Date   TSH 2.820 04/08/2019   Lab Results  Component Value Date   WBC 7.0 04/08/2019   HGB 13.2 04/08/2019   HCT 38.4 04/08/2019   MCV 92 04/08/2019   PLT 298 04/08/2019   Lab Results  Component Value Date   NA 141 04/08/2019   K 4.4 04/08/2019   CO2 23 04/08/2019   GLUCOSE 86 04/08/2019   BUN 13 04/08/2019   CREATININE 0.88 04/08/2019   BILITOT 0.4 04/08/2019   ALKPHOS 80 04/08/2019   AST 23 04/08/2019   ALT 16 04/08/2019   PROT 6.9 04/08/2019   ALBUMIN 4.3 04/08/2019   CALCIUM 9.2 04/08/2019   ANIONGAP 14 12/29/2013   GFR 63.74  06/16/2018   Lab Results  Component Value Date   CHOL 178 04/08/2019   Lab Results  Component Value Date   HDL 72 04/08/2019   Lab Results  Component Value Date   LDLCALC 83 04/08/2019   Lab Results  Component Value Date   TRIG 137 04/08/2019   Lab Results  Component Value Date   CHOLHDL 2.5 04/08/2019   Lab Results  Component Value Date   HGBA1C 6.1 05/03/2015      Assessment & Plan:   Problem List Items Addressed This Visit      Unprioritized   Essential hypertension    Other Visit Diagnoses    Malaise    -  Primary   Need for influenza vaccination       Relevant Orders   Flu Vaccine QUAD High Dose(Fluad) (Completed)   Depressed mood        Initial blood pressure was slightly elevated.  Slightly improved on repeat after rest.  Flu vaccine given  We discussed her depressed mood.  We have strongly encouraged her to continue to stay active and engaged with activities.  She is not interested in considering antidepressant at this point.  We have suggested counseling in the past.    Regarding insomnia we discussed importance of avoiding bright lights within a couple hours of bedtime.  Avoid daytime naps.  Avoid regular use of sedative hypnotics  She has scheduled follow-up with cardiology in early November.  Follow-up sooner for any exertional or progressive chest symptoms.  No orders of the defined types were placed in this encounter.   Follow-up: Return in about 3 months (around 01/11/2020).  Evelena Peat, MD

## 2019-10-21 ENCOUNTER — Ambulatory Visit: Payer: Medicare Other | Admitting: Neurology

## 2019-10-25 ENCOUNTER — Ambulatory Visit: Payer: Medicare Other | Admitting: Neurology

## 2019-11-05 DIAGNOSIS — H04123 Dry eye syndrome of bilateral lacrimal glands: Secondary | ICD-10-CM | POA: Diagnosis not present

## 2019-11-05 DIAGNOSIS — H5211 Myopia, right eye: Secondary | ICD-10-CM | POA: Diagnosis not present

## 2019-11-05 DIAGNOSIS — Z961 Presence of intraocular lens: Secondary | ICD-10-CM | POA: Diagnosis not present

## 2019-11-05 DIAGNOSIS — H26493 Other secondary cataract, bilateral: Secondary | ICD-10-CM | POA: Diagnosis not present

## 2019-11-05 DIAGNOSIS — H35373 Puckering of macula, bilateral: Secondary | ICD-10-CM | POA: Diagnosis not present

## 2019-11-05 DIAGNOSIS — H33321 Round hole, right eye: Secondary | ICD-10-CM | POA: Diagnosis not present

## 2019-11-05 DIAGNOSIS — H52222 Regular astigmatism, left eye: Secondary | ICD-10-CM | POA: Diagnosis not present

## 2019-11-05 DIAGNOSIS — H524 Presbyopia: Secondary | ICD-10-CM | POA: Diagnosis not present

## 2019-11-09 ENCOUNTER — Encounter: Payer: Self-pay | Admitting: Anesthesiology

## 2019-11-13 ENCOUNTER — Ambulatory Visit: Payer: Medicare Other | Attending: Internal Medicine

## 2019-11-13 DIAGNOSIS — Z23 Encounter for immunization: Secondary | ICD-10-CM

## 2019-11-13 NOTE — Progress Notes (Signed)
Covid-19 Vaccination Clinic  Name:  LARETA LEGGITT    MRN: [id number] DOB: [birth year]  11/13/2019  Ms. Gunnarson was observed post Covid-19 immunization for 15 minutes without incident. She was provided with Vaccine Information Sheet and instruction to access the V-Safe system.   Ms. Spitzley was instructed to call 911 with any severe reactions post vaccine: Marland Kitchen Difficulty breathing  . Swelling of face and throat  . A fast heartbeat  . A bad rash all over body  . Dizziness and weakness

## 2019-11-15 ENCOUNTER — Emergency Department (HOSPITAL_COMMUNITY): Payer: Medicare Other

## 2019-11-15 ENCOUNTER — Emergency Department (HOSPITAL_COMMUNITY)
Admission: EM | Admit: 2019-11-15 | Discharge: 2019-11-15 | Disposition: A | Discharge status: Home / Self Care | Payer: Medicare Other | Attending: Emergency Medicine | Admitting: Emergency Medicine

## 2019-11-15 ENCOUNTER — Other Ambulatory Visit: Payer: Self-pay

## 2019-11-15 ENCOUNTER — Encounter (HOSPITAL_COMMUNITY): Payer: Self-pay

## 2019-11-15 DIAGNOSIS — R079 Chest pain, unspecified: Secondary | ICD-10-CM | POA: Insufficient documentation

## 2019-11-15 DIAGNOSIS — Z79899 Other long term (current) drug therapy: Secondary | ICD-10-CM | POA: Insufficient documentation

## 2019-11-15 DIAGNOSIS — Z7982 Long term (current) use of aspirin: Secondary | ICD-10-CM | POA: Insufficient documentation

## 2019-11-15 DIAGNOSIS — I10 Essential (primary) hypertension: Secondary | ICD-10-CM | POA: Insufficient documentation

## 2019-11-15 DIAGNOSIS — R059 Cough, unspecified: Secondary | ICD-10-CM | POA: Insufficient documentation

## 2019-11-15 DIAGNOSIS — R0789 Other chest pain: Secondary | ICD-10-CM | POA: Diagnosis not present

## 2019-11-15 LAB — BASIC METABOLIC PANEL
Anion gap: 8 (ref 5–15)
BUN: 13 mg/dL (ref 8–23)
CO2: 26 mmol/L (ref 22–32)
Calcium: 8.7 mg/dL — ABNORMAL LOW (ref 8.9–10.3)
Chloride: 108 mmol/L (ref 98–111)
Creatinine, Ser: 0.9 mg/dL (ref 0.44–1.00)
GFR, Estimated: >60 mL/min (ref >60)
Glucose, Bld: 97 mg/dL (ref 70–99)
Potassium: 3.4 mmol/L — ABNORMAL LOW (ref 3.5–5.1)
Sodium: 142 mmol/L (ref 135–145)

## 2019-11-15 LAB — CBC
HCT: 40.7 % (ref 36.0–46.0)
Hemoglobin: 13.4 g/dL (ref 12.0–15.0)
MCH: 31.5 pg (ref 26.0–34.0)
MCHC: 32.9 g/dL (ref 30.0–36.0)
MCV: 95.5 fL (ref 80.0–100.0)
Platelets: 254 10*3/uL (ref 150–400)
RBC: 4.26 MIL/uL (ref 3.87–5.11)
RDW: 12.2 % (ref 11.5–15.5)
WBC: 5.2 10*3/uL (ref 4.0–10.5)
nRBC: 0 % (ref 0.0–0.2)

## 2019-11-15 LAB — CBG MONITORING, ED: Glucose-Capillary: 93 mg/dL (ref 70–99)

## 2019-11-15 LAB — TROPONIN I (HIGH SENSITIVITY)
Troponin I (High Sensitivity): 3 ng/L (ref <18)
Troponin I (High Sensitivity): 2 ng/L (ref <18)

## 2019-11-15 NOTE — ED Provider Notes (Signed)
[facility] DEPT Provider Note   CSN: [id number] Arrival date & time: 11/15/19  1105     History Chief Complaint  Patient presents with  . chest wall pain    Margaret Mitchell is a 79 y.o. female.  HPI  HPI: A 79 year old patient with a history of hypertension presents for evaluation of chest pain. Initial onset of pain was more than 6 hours ago. The patient's chest pain is described as heaviness/pressure/tightness and is not worse with exertion. The patient's chest pain is middle- or left-sided, is not well-localized, is not sharp and does not radiate to the arms/jaw/neck. The patient does not complain of nausea and denies diaphoresis. The patient has no history of stroke, has no history of peripheral artery disease, has not smoked in the past 90 days, denies any history of treated diabetes, has no relevant family history of coronary artery disease (first degree relative at less than age 81), has no history of hypercholesterolemia and does not have an elevated BMI (>=30). Patient states she has had this pain for about the last 5 days.  It all started after she had a URI with persistent cough.  Patient feels like that has settled down but she has noted this pain in her chest that gets worse with breathing and certain movements.  She is not having any fevers or chills.  No shortness of breath.  No leg swelling.  She called her doctor instructed to come to ED for evaluation  Past Medical History:  Diagnosis Date  . Allergy, unspecified not elsewhere classified   . Anxiety   . Arthritis   . Cervical dysplasia    prior to hysterectomy  . GERD (gastroesophageal reflux disease)   . Headache(784.0)   . Hypertension   . IBS (irritable bowel syndrome)   . Osteopenia 12/2015   T score -1.1 FRAX 3.7%/0.4%  . Pelvic adhesions   . Pelvic pain in female   . Sinus infection   . Thyroid disease    Parathyroid cyst    Patient Active Problem List   Diagnosis Date  Noted  . Depression, major, single episode, moderate ([place]) 10/21/2017  . Chronic constipation 12/01/2014  . Muscle cramps 06/15/2013  . Obesity (BMI 30-39.9) 05/25/2013  . Dyspnea 03/04/2013  . Multinodular thyroid 03/04/2013  . Meralgia paresthetica of left side 01/01/2013  . Chest pain, atypical 11/16/2012  . Osteopenia 05/13/2012  . Neuropathy 12/30/2011  . Restless legs syndrome (RLS) 11/12/2011  . Anxiety   . Cervical dysplasia   . Vertigo 09/06/2011  . Herpes zoster 03/20/2011  . Pelvic adhesions   . Pelvic pain in female   . Menorrhagia   . HYPERCHOLESTEROLEMIA, BORDERLINE 11/08/2008  . GAIT IMBALANCE 05/11/2008  . LEG CRAMPS 04/26/2008  . HIATAL HERNIA 06/03/2007  . Essential hypertension 01/30/2007  . GERD 01/30/2007  . CONSTIPATION 01/30/2007  . IRRITABLE BOWEL SYNDROME 01/30/2007  . DEGENERATIVE JOINT DISEASE 01/30/2007  . HEADACHE 01/30/2007  . ALLERGY 01/30/2007  . BENIGN NEOPLASM [place] DIGESTIVE SYSTEM 04/17/2005    Past Surgical History:  Procedure Laterality Date  . ABDOMINAL HYSTERECTOMY  1983   TAH/BSO for pelvic pain and adhesions  . APPENDECTOMY  1994  . CATARACT EXTRACTION Bilateral 2013  . COLPOSCOPY    . FOOT SURGERY Bilateral   . PARATHYROID ADENOMA REMOVED  02/1980  . ROTATOR CUFF REPAIR     X2  . SHOULDER INJECTION  10/2011  . TONSILLECTOMY    . TUBAL LIGATION  1981  .  VESICOVAGINAL FISTULA CLOSURE W/ TAH       OB History    Gravida  1   Para      Term      Preterm      AB  1   Living  0     SAB  1   TAB      Ectopic      Multiple      Live Births              Family History  Problem Relation Age of Onset  . Diabetes Mother   . Lung cancer Father   . Leukemia Brother   . Hypertension Maternal Grandmother   . Heart disease Maternal Grandmother   . Diabetes Maternal Grandmother   . Stroke Maternal Grandmother   . Diabetes Maternal Aunt   . Heart failure Paternal Grandmother     Social History     Tobacco Use  . Smoking status: Never Smoker  . Smokeless tobacco: Never Used  Vaping Use  . Vaping Use: Never used  Substance Use Topics  . Alcohol use: Yes    Comment: Rare  . Drug use: No    Home Medications Prior to Admission medications   Medication Sig Start Date End Date Taking? Authorizing Provider  amLODipine-benazepril (LOTREL) 5-20 MG capsule TAKE 1 CAPSULE DAILY 06/28/19   Burchette, Alinda Sierras, MD  aspirin 81 MG tablet Take 81 mg by mouth daily.      [provider]  metoprolol succinate (TOPROL XL) 50 MG 24 hr tablet Take 1 tablet (50 mg total) by mouth daily. Take with or immediately following a meal. 09/21/19   Burchette, Alinda Sierras, MD  Multiple Vitamin (MULTIVITAMIN) tablet Take 1 tablet by mouth daily.    [provider]    Allergies    Other, Prednisone, Seldane [terfenadine], Sulfa antibiotics, Sulfasalazine, Sulfonamide derivatives, and Vioxx [rofecoxib]  Review of Systems   Review of Systems  All other systems reviewed and are negative.   Physical Exam Updated Vital Signs BP (!) 159/77   Pulse 70   Temp 98.4 F (36.9 C) (Oral)   Resp 18   Ht 1.689 m (5' 6.5")   Wt 88.5 kg   SpO2 99%   BMI 31.00 kg/m   Physical Exam Vitals and nursing note reviewed.  Constitutional:      General: She is not in acute distress.    Appearance: She is well-developed.  HENT:     Head: Normocephalic and atraumatic.     Right Ear: External ear normal.     Left Ear: External ear normal.  Eyes:     General: No scleral icterus.       Right eye: No discharge.        Left eye: No discharge.     Conjunctiva/sclera: Conjunctivae normal.  Neck:     Trachea: No tracheal deviation.  Cardiovascular:     Rate and Rhythm: Normal rate and regular rhythm.  Pulmonary:     Effort: Pulmonary effort is normal. No respiratory distress.     Breath sounds: Normal breath sounds. No stridor. No wheezing or rales.  Abdominal:     General: Bowel sounds are normal. There  is no distension.     Palpations: Abdomen is soft.     Tenderness: There is no abdominal tenderness. There is no guarding or rebound.  Musculoskeletal:        General: No tenderness.     Cervical back: Neck supple.  Skin:    General: Skin is warm and dry.     Findings: No rash.  Neurological:     Mental Status: She is alert.     Cranial Nerves: No cranial nerve deficit (no facial droop, extraocular movements intact, no slurred speech).     Sensory: No sensory deficit.     Motor: No abnormal muscle tone or seizure activity.     Coordination: Coordination normal.     ED Results / Procedures / Treatments   Labs (all labs ordered are listed, but only abnormal results are displayed) Labs Reviewed  BASIC METABOLIC PANEL - Abnormal; Notable for the following components:      Result Value   Potassium 3.4 (*)    Calcium 8.7 (*)    All other components within normal limits  CBC  CBG MONITORING, ED  TROPONIN I (HIGH SENSITIVITY)  TROPONIN I (HIGH SENSITIVITY)    EKG EKG Interpretation  Date/Time:  Monday November 15 2019 12:10:33 EDT Ventricular Rate:  73 PR Interval:    QRS Duration: 84 QT Interval:  404 QTC Calculation: 446 R Axis:   84 Text Interpretation: Age not entered, assumed to be  78 years old for purpose of ECG interpretation Sinus rhythm No significant change since last tracing Confirmed by Dorie Rank [phone]) on 11/15/2019 1:16:57 PM   Radiology DG Chest 2 View  Result Date: 11/15/2019 CLINICAL DATA:  Chest wall pain. EXAM: CHEST - 2 VIEW COMPARISON:  None. FINDINGS: The heart size and mediastinal contours are within normal limits. Both lungs are clear. No pneumothorax or pleural effusion is noted. The visualized skeletal structures are unremarkable. IMPRESSION: No active cardiopulmonary disease. Electronically Signed   By: Marijo Conception M.D.   On: 11/15/2019 11:47    Procedures Procedures (including critical care time)  Medications Ordered in ED Medications  - No data to display  ED Course  I have reviewed the triage vital signs and the nursing notes.  Pertinent labs & imaging results that were available during my care of the patient were reviewed by me and considered in my medical decision making (see chart for details).    MDM Rules/Calculators/A&P HEAR Score: 4                        Patient presented to ED for evaluation of chest pain.  Patient noticed her symptoms started after coughing.  Symptoms not typical for acute coronary syndrome.  ED work-up is reassuring with normal serial troponins.  Patient is not have any shortness of breath.  I doubt PE.  No evidence of pneumonia pneumothorax or other abnormality on x-ray.  It is possible her symptoms may pleurisy.  Discussed over-the-counter medication use as needed.  Follow-up with PCP. Final Clinical Impression(s) / ED Diagnoses Final diagnoses:  Chest pain, unspecified type    Rx / DC Orders ED Discharge Orders    None       Dorie Rank, MD 11/15/19 1556

## 2019-11-15 NOTE — ED Notes (Signed)
An After Visit Summary was printed and given to the patient. Discharge instructions given and no further questions at this time.  

## 2019-11-15 NOTE — Discharge Instructions (Addendum)
Take over-the-counter medications as needed for pain or discomfort.  Your test today were reassuring.  No signs of pneumonia or heart injury.  Follow-up with your primary care doctor next week to be rechecked if your symptoms persist

## 2019-11-15 NOTE — ED Triage Notes (Signed)
Patient reports that she has had chest wall pain x 5 days. Patient states she has chest wall pain when she raises her arms above her head, cough, or turn over in the bed.

## 2019-11-23 ENCOUNTER — Encounter: Payer: Self-pay | Admitting: Family Medicine

## 2019-11-23 ENCOUNTER — Other Ambulatory Visit: Payer: Self-pay | Admitting: Radiology

## 2019-11-23 DIAGNOSIS — R921 Mammographic calcification found on diagnostic imaging of breast: Secondary | ICD-10-CM | POA: Diagnosis not present

## 2019-11-23 DIAGNOSIS — N6012 Diffuse cystic mastopathy of left breast: Secondary | ICD-10-CM | POA: Diagnosis not present

## 2019-11-25 DIAGNOSIS — M25532 Pain in left wrist: Secondary | ICD-10-CM | POA: Diagnosis not present

## 2019-11-30 ENCOUNTER — Encounter: Payer: Self-pay | Admitting: Gynecology

## 2019-12-06 ENCOUNTER — Other Ambulatory Visit: Payer: Self-pay

## 2019-12-06 ENCOUNTER — Ambulatory Visit (INDEPENDENT_AMBULATORY_CARE_PROVIDER_SITE_OTHER): Payer: Medicare Other | Admitting: Cardiovascular Disease

## 2019-12-06 ENCOUNTER — Encounter: Payer: Self-pay | Admitting: Cardiovascular Disease

## 2019-12-06 VITALS — BP 132/72 | HR 66 | Ht 66.5 in | Wt 205.6 lb

## 2019-12-06 DIAGNOSIS — I10 Essential (primary) hypertension: Secondary | ICD-10-CM | POA: Diagnosis not present

## 2019-12-06 DIAGNOSIS — R002 Palpitations: Secondary | ICD-10-CM | POA: Diagnosis not present

## 2019-12-06 DIAGNOSIS — F321 Major depressive disorder, single episode, moderate: Secondary | ICD-10-CM | POA: Diagnosis not present

## 2019-12-06 DIAGNOSIS — I5189 Other ill-defined heart diseases: Secondary | ICD-10-CM

## 2019-12-06 DIAGNOSIS — I340 Nonrheumatic mitral (valve) insufficiency: Secondary | ICD-10-CM

## 2019-12-06 DIAGNOSIS — D351 Benign neoplasm of parathyroid gland: Secondary | ICD-10-CM | POA: Diagnosis not present

## 2019-12-06 NOTE — Progress Notes (Signed)
Cardiology Office Note    Date:  12/07/2019   ID:  Regene, Eapen 02/06/1940, MRN [id number]  PCP:  Kristian Covey, MD  Cardiologist:  Nicki Guadalajara, MD   F/U cardiology evaluation initally referred through the courtesy of Dr. Evelena Peat for evaluation of palpitations.  History of Present Illness:  Margaret Mitchell is a 79 y.o. female who is followed by Dr. Evelena Peat.  I saw her for initial evaluation on March 18, 2019.  She presents for an 32-month follow-up cardiology evaluation.     [name] admits to having a longstanding history of hypertension and over the years has been treated with Lotrel 5/20.  In 2007, metoprolol was added to her medical regimen.  Recently, she had been maintained on 100 mg of metoprolol succinate in addition to her Lotrel 5/20.  On December 21, 2018, her husband unfortunately passed away.  She admits to being under some increased stress since that time.  Over the past several months, she has noticed isolated episodes of palpitations which seem to occur after she sits down and is at rest and notes occasional isolated skips.  She is unaware of any sustained episodes of increasing heart rate or palpitations.  She had been seen by Dr. Caryl Never and was started on sertraline following her husband's death to help with some depression and anxiety.  She admits to using caffeine 1 cup/day of coffee.  She does not eat chocolates.  She was recently reevaluated by Dr. Caryl Never and her dose of metoprolol succinate was reduced from 100 mg down to 50 mg.  She is unaware of any major benefit with this dose reduction.  However she denies any chest pain PND orthopnea presyncope or syncope.  Remotely she states that she had worn a monitor and nothing was picked up.  Review of her records reveals that in 2017 she had a 2D echo Doppler study which showed an EF of 60 to 65%.  There was mild focal basal hypertrophy of the septum.  She had normal diastolic parameters.   There was minimal calcification to her aortic valve leaflets.  In February 2018 she underwent a [place] Myoview study which reviewed revealed normal perfusion without scar or ischemia and nuclear stress EF at 60%.  Thyroid function studies in June 2020 were normal.  When I initially saw her I recommended that she undergo a 4-year follow-up echo Doppler study and recommended a complete set of laboratory.  Her echo Doppler study was done in April 20, 2019 which showed an EF of 60 to 65% and grade 2 diastolic dysfunction.  There was mild mitral regurgitation.  She was evaluated by Evelena Peat her primary provider in September 2021.  She apparently had developed some sinus drainage with coughing and chest congestion and was instructed to go to the ER on November 1.  During that evaluation her potassium was 3.4 and calcium 8.7.  Presently she denies any chest pain or palpitations.  She is on amlodipine/benazepril 5/20 mg daily as well as metoprolol succinate 50 mg for blood pressure control.  She had her Covid booster vaccine in October.  She presents for reevaluation.  Past Medical History:  Diagnosis Date  . Allergy, unspecified not elsewhere classified   . Anxiety   . Arthritis   . Cervical dysplasia    prior to hysterectomy  . GERD (gastroesophageal reflux disease)   . Headache(784.0)   . Hypertension   . IBS (irritable bowel syndrome)   . Osteopenia  12/2015   T score -1.1 FRAX 3.7%/0.4%  . Pelvic adhesions   . Pelvic pain in female   . Sinus infection   . Thyroid disease    Parathyroid cyst    Past Surgical History:  Procedure Laterality Date  . ABDOMINAL HYSTERECTOMY  1983   TAH/BSO for pelvic pain and adhesions  . APPENDECTOMY  1994  . CATARACT EXTRACTION Bilateral 2013  . COLPOSCOPY    . FOOT SURGERY Bilateral   . PARATHYROID ADENOMA REMOVED  02/1980  . ROTATOR CUFF REPAIR     X2  . SHOULDER INJECTION  10/2011  . TONSILLECTOMY    . TUBAL LIGATION  1981  .  VESICOVAGINAL FISTULA CLOSURE W/ TAH      Current Medications: Outpatient Medications Prior to Visit  Medication Sig Dispense Refill  . amLODipine-benazepril (LOTREL) 5-20 MG capsule TAKE 1 CAPSULE DAILY 90 capsule 3  . aspirin 81 MG tablet Take 81 mg by mouth daily.      . metoprolol succinate (TOPROL XL) 50 MG 24 hr tablet Take 1 tablet (50 mg total) by mouth daily. Take with or immediately following a meal. 90 tablet 1  . Multiple Vitamin (MULTIVITAMIN) tablet Take 1 tablet by mouth daily.     No facility-administered medications prior to visit.     Allergies:   Other, Prednisone, Seldane [terfenadine], Sulfa antibiotics, Sulfasalazine, Sulfonamide derivatives, and Vioxx [rofecoxib]   Social History   Socioeconomic History  . Marital status: Widowed    Spouse name: Charlann Noss  . Number of children: 1  . Years of education: Not on file  . Highest education level: Not on file  Occupational History  . Occupation: retired    Associate Professor: RETIRED  Tobacco Use  . Smoking status: Never Smoker  . Smokeless tobacco: Never Used  Vaping Use  . Vaping Use: Never used  Substance and Sexual Activity  . Alcohol use: Yes    Comment: Rare  . Drug use: No  . Sexual activity: Never    Birth control/protection: Surgical    Comment: 1st intercourse 79 yo-Fewer than 5 partners  Other Topics Concern  . Not on file  Social History Narrative   Lives with husband.   Retired from working Barrister's clerk at [employer]   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   Right handed   One story home   Social Determinants of Health   Financial Resource Strain: Low Risk   . Difficulty of Paying Living Expenses: Not hard at all  Food Insecurity: No Food Insecurity  . Worried About Programme researcher, broadcasting/film/video in the Last Year: Never true  . Ran Out of Food in the Last Year: Never true  Transportation Needs: No Transportation Needs  . Lack of Transportation (Medical): No  . Lack of Transportation  (Non-Medical): No  Physical Activity: Inactive  . Days of Exercise per Week: 0 days  . Minutes of Exercise per Session: 0 min  Stress: No Stress Concern Present  . Feeling of Stress : Not at all  Social Connections: Moderately Isolated  . Frequency of Communication with Friends and Family: More than three times a week  . Frequency of Social Gatherings with Friends and Family: More than three times a week  . Attends Religious Services: More than 4 times per year  . Active Member of Clubs or Organizations: No  . Attends Banker Meetings: Never  . Marital Status: Widowed     Socially she is  widowed.  She  was married to her husband initially for 10 years.  They were then divorced and lived apart for 40 years and then again were remarried for 15 years prior to his death on 01-22-2019.  She has an adopted son and no grandchildren.  There is no tobacco history.  She does drink an occasional glass of wine.  She is retired and previously had worked for our [employer].  She graduated high school.  Family History:  The patient's family history includes Diabetes in her maternal aunt, maternal grandmother, and mother; Heart disease in her maternal grandmother; Heart failure in her paternal grandmother; Hypertension in her maternal grandmother; Leukemia in her brother; Lung cancer in her father; Stroke in her maternal grandmother.  Her mother died at age 56 and had dementia.  Her father died at age 83 and had cancer as well as heart trouble.  Her brother died at age 56 secondary to leukemia.  ROS General: Negative; No fevers, chills, or night sweats;  HEENT: Negative; No changes in vision or hearing, sinus congestion, difficulty swallowing Pulmonary: Negative; No cough, wheezing, shortness of breath, hemoptysis Cardiovascular: See HPI GI: Negative; No nausea, vomiting, diarrhea, or abdominal pain GU: Negative; No dysuria, hematuria, or difficulty voiding Musculoskeletal:  Negative; no myalgias, joint pain, or weakness Hematologic/Oncology: Negative; no easy bruising, bleeding Endocrine: History of removal of parathyroid adenoma Neuro: Negative; no changes in balance, headaches Skin: Negative; No rashes or skin lesions Psychiatric: Positive for depression Sleep: Negative; No snoring, daytime sleepiness, hypersomnolence, bruxism, restless legs, hypnogognic hallucinations, no cataplexy  An Epworth Sleepiness Scale score was calculated in the office today and this endorsed at 1 arguing against excessive daytime sleepiness.  Other comprehensive 14 point system review is negative.   PHYSICAL EXAM:   VS:  BP 132/72   Pulse 66   Ht 5' 6.5" (1.689 m)   Wt 205 lb 9.6 oz (93.3 kg)   BMI 32.69 kg/m     Repeat blood pressure by me was 124/70  Wt Readings from Last 3 Encounters:  12/06/19 205 lb 9.6 oz (93.3 kg)  11/15/19 195 lb (88.5 kg)  10/12/19 198 lb 8 oz (90 kg)    General: Alert, oriented, no distress.  Skin: normal turgor, no rashes, warm and dry HEENT: Normocephalic, atraumatic. Pupils equal round and reactive to light; sclera anicteric; extraocular muscles intact;  Nose without nasal septal hypertrophy Mouth/Parynx benign; Mallinpatti scale 3 Neck: No JVD, no carotid bruits; normal carotid upstroke Lungs: clear to ausculatation and percussion; no wheezing or rales Chest wall: without tenderness to palpitation Heart: PMI not displaced, RRR, s1 s2 normal, 1/6 systolic murmur, no diastolic murmur, no rubs, gallops, thrills, or heaves Abdomen: soft, nontender; no hepatosplenomehaly, BS+; abdominal aorta nontender and not dilated by palpation. Back: no CVA tenderness Pulses 2+ Musculoskeletal: full range of motion, normal strength, no joint deformities Extremities: no clubbing cyanosis or edema, Homan's sign negative  Neurologic: grossly nonfocal; Cranial nerves grossly wnl Psychologic: Normal mood and affect   Studies/Labs Reviewed:   EKG:  EKG  is ordered today.  ECG (independently read by me): NSR at 66; no ectopy; normal intervals  MArch 2021 ECG (independently read by me):  EKG normal sinus rhythm at 63 bpm.  LVH by voltage.  Normal intervals.  No ectopy.  No ST segment changes.  Recent Labs: BMP Latest Ref Rng & Units 11/15/2019 04/08/2019 06/16/2018  Glucose 70 - 99 mg/dL 97 86 76  BUN 8 - 23 mg/dL 13 13 16  Creatinine 0.44 - 1.00 mg/dL 6.29 5.28 4.13  BUN/Creat Ratio 12 - 28 - 15 -  Sodium 135 - 145 mmol/L 142 141 140  Potassium 3.5 - 5.1 mmol/L 3.4(L) 4.4 4.8  Chloride 98 - 111 mmol/L 108 105 103  CO2 22 - 32 mmol/L 26 23 28   Calcium 8.9 - 10.3 mg/dL 2.4(M) 9.2 9.1     Hepatic Function Latest Ref Rng & Units 04/08/2019 06/16/2018 12/31/2016  Total Protein 6.0 - 8.5 g/dL 6.9 7.0 7.5  Albumin 3.7 - 4.7 g/dL 4.3 4.2 4.3  AST 0 - 40 IU/L 23 21 19   ALT 0 - 32 IU/L 16 19 14   Alk Phosphatase 39 - 117 IU/L 80 77 87  Total Bilirubin 0.0 - 1.2 mg/dL 0.4 0.6 0.4  Bilirubin, Direct 0.0 - 0.3 mg/dL - - -    CBC Latest Ref Rng & Units 11/15/2019 04/08/2019 06/16/2018  WBC 4.0 - 10.5 K/uL 5.2 7.0 8.3  Hemoglobin 12.0 - 15.0 g/dL 01.0 27.2 53.6  Hematocrit 36 - 46 % 40.7 38.4 39.9  Platelets 150 - 400 K/uL 254 298 274.0   Lab Results  Component Value Date   MCV 95.5 11/15/2019   MCV 92 04/08/2019   MCV 95.0 06/16/2018   Lab Results  Component Value Date   TSH 2.820 04/08/2019   Lab Results  Component Value Date   HGBA1C 6.1 05/03/2015     BNP No results found for: BNP  ProBNP No results found for: PROBNP   Lipid Panel     Component Value Date/Time   CHOL 178 04/08/2019 0959   TRIG 137 04/08/2019 0959   HDL 72 04/08/2019 0959   CHOLHDL 2.5 04/08/2019 0959   CHOLHDL 3 05/13/2012 1001   VLDL 25.0 05/13/2012 1001   LDLCALC 83 04/08/2019 0959   LDLDIRECT 104.8 05/07/2010 1004   LABVLDL 23 04/08/2019 0959     RADIOLOGY: DG Chest 2 View  Result Date: 11/15/2019 CLINICAL DATA:  Chest wall pain. EXAM: CHEST - 2  VIEW COMPARISON:  None. FINDINGS: The heart size and mediastinal contours are within normal limits. Both lungs are clear. No pneumothorax or pleural effusion is noted. The visualized skeletal structures are unremarkable. IMPRESSION: No active cardiopulmonary disease. Electronically Signed   By: Lupita Raider M.D.   On: 11/15/2019 11:47     Additional studies/ records that were reviewed today include:  I reviewed the patient's prior medical records including her echo Doppler study in 2017 and [place] Myoview study in 2018.  ECHO: 04/20/2019 IMPRESSIONS  1. Left ventricular ejection fraction, by estimation, is 60 to 65%. The  left ventricle has normal function. The left ventricle has no regional  wall motion abnormalities. Left ventricular diastolic parameters are  consistent with Grade II diastolic  dysfunction (pseudonormalization).  2. Right ventricular systolic function is normal. The right ventricular  size is normal. There is normal pulmonary artery systolic pressure. The  estimated right ventricular systolic pressure is 19.4 mmHg.  3. The mitral valve is grossly normal. Mild mitral valve regurgitation.  No evidence of mitral stenosis.  4. The aortic valve is tricuspid. Aortic valve regurgitation is not  visualized. No aortic stenosis is present.  5. The inferior vena cava is normal in size with greater than 50%  respiratory variability, suggesting right atrial pressure of 3 mmHg.   ASSESSMENT:    1. Essential hypertension   2. Grade II diastolic dysfunction   3. Palpitations   4. Mild mitral regurgitation   5.  H/O Parathyroid adenoma: Status post removal   6. Depression, major, single episode, moderate (HCC)    PLAN:  Ms. December Baumgarner is a very pleasant 79 year old African-American female who is a recent widow since 01-14-19.  Since her husband's death she experienced increased anxiety and stress and had developed occasional episodeS of isolated palpitations.   She denies any exertional precipitation to her palpitations and she denies any sustained runs of rhythm abnormality.  Typically the palpitations develop after she has been sitting down and resting for at least 10 minutes and then she notes an isolated skip intermittently.  This is unassociated with any lightheadedness or chest pain or left arm radiation.  At the time she had been on high-dose metoprolol succinate at 100 mg daily in addition to her amlodipine/benazepril 20/10 mg.  I suspect she was bradycardic and when she sat down heart rate further slowed below the rate of her ectopic focus resulting in isolated PVC.  We discussed the importance of caffeine use.  Her dose of metoprolol had been reduced and when seen in March she was no longer bradycardic.  I reviewed her echo Doppler study with her in detail which showed normal systolic function with EF at 60 to 65%.  There was grade 2 diastolic dysfunction as well as mild mitral regurgitation.  Presently, her blood pressure is stable and on repeat by me was 124/70 on her regimen consisting of amlodipine/benazepril 5/20 mg and metoprolol succinate at bedtime 50 mg daily.  I reviewed her recent ER evaluation at [facility] on November 1.  At that time potassium was low at 3.4.  Troponins were negative.  She does not have any anginal type symptoms.  She has remote history of a parathyroid adenoma and underwent resection.  Clinically she is stable.  She returned to the care of Dr. Caryl Never.  I will see her in 1 year for reevaluation.   Medication Adjustments/Labs and Tests Ordered: Current medicines are reviewed at length with the patient today.  Concerns regarding medicines are outlined above.  Medication changes, Labs and Tests ordered today are listed in the Patient Instructions below. Patient Instructions  Medication Instructions:  NO CHANGES  *If you need a refill on your cardiac medications before your next appointment, please call your  pharmacy*   Lab Work:  NOT NEEDED  Testing/Procedures: NOT NEEDED   Follow-Up: At [facility], you and your health needs are our priority.  As part of our continuing mission to provide you with exceptional heart care, we have created designated Provider Care Teams.  These Care Teams include your primary Cardiologist (physician) and Advanced Practice Providers (APPs -  Physician Assistants and Nurse Practitioners) who all work together to provide you with the care you need, when you need it.     Your next appointment:   12 month(s)  The format for your next appointment:   In Person  Provider:   Nicki Guadalajara, MD      Signed, Nicki Guadalajara, MD  12/07/2019 5:52 PM    [facility] [address], [place], [place]  [address] Phone: [phone]

## 2019-12-06 NOTE — Patient Instructions (Signed)
Medication Instructions:   NO CHANGES  *If you need a refill on your cardiac medications before your next appointment, please call your pharmacy*   Lab Work:  NOT NEEDED  Testing/Procedures: NOT NEEDED   Follow-Up: At [facility], you and your health needs are our priority.  As part of our continuing mission to provide you with exceptional heart care, we have created designated Provider Care Teams.  These Care Teams include your primary Cardiologist (physician) and Advanced Practice Providers (APPs -  Physician Assistants and Nurse Practitioners) who all work together to provide you with the care you need, when you need it.     Your next appointment:   12 month(s)  The format for your next appointment:   In Person  Provider:   Thomas Kelly, MD   

## 2019-12-07 ENCOUNTER — Encounter: Payer: Self-pay | Admitting: Cardiovascular Disease

## 2019-12-16 ENCOUNTER — Other Ambulatory Visit: Payer: Self-pay

## 2019-12-16 ENCOUNTER — Ambulatory Visit (INDEPENDENT_AMBULATORY_CARE_PROVIDER_SITE_OTHER): Payer: Medicare Other | Admitting: Obstetrics and Gynecology

## 2019-12-16 ENCOUNTER — Encounter: Payer: Self-pay | Admitting: Obstetrics and Gynecology

## 2019-12-16 VITALS — BP 130/84 | Ht 67.0 in | Wt 203.0 lb

## 2019-12-16 DIAGNOSIS — R921 Mammographic calcification found on diagnostic imaging of breast: Secondary | ICD-10-CM

## 2019-12-16 DIAGNOSIS — M8580 Other specified disorders of bone density and structure, unspecified site: Secondary | ICD-10-CM

## 2019-12-16 DIAGNOSIS — Z01419 Encounter for gynecological examination (general) (routine) without abnormal findings: Secondary | ICD-10-CM

## 2019-12-16 NOTE — Progress Notes (Signed)
Margaret Mitchell Sep 16, 1940 [id number]  SUBJECTIVE:  79 y.o. G1P0010 female here for a breast and pelvic exam. She has no gynecologic concerns.  Current Outpatient Medications  Medication Sig Dispense Refill  . amLODipine-benazepril (LOTREL) 5-20 MG capsule TAKE 1 CAPSULE DAILY 90 capsule 3  . aspirin 81 MG tablet Take 81 mg by mouth daily.      . metoprolol succinate (TOPROL XL) 50 MG 24 hr tablet Take 1 tablet (50 mg total) by mouth daily. Take with or immediately following a meal. 90 tablet 1  . Multiple Vitamin (MULTIVITAMIN) tablet Take 1 tablet by mouth daily.     No current facility-administered medications for this visit.   Allergies: Other, Prednisone, Seldane [terfenadine], Sulfa antibiotics, Sulfasalazine, Sulfonamide derivatives, and Vioxx [rofecoxib]  No LMP recorded. Patient has had a hysterectomy.  Past medical history,surgical history, problem list, medications, allergies, family history and social history were all reviewed and documented as reviewed in the EPIC chart.  GYN ROS: no abnormal bleeding, pelvic pain or discharge, no breast pain or new or enlarging lumps on self exam.  No dysuria, frequency, burning, pain with urination, cloudy/malodorous urine.   OBJECTIVE:  BP 130/84   Ht 5\' 7"  (1.702 m)   Wt 203 lb (92.1 kg)   BMI 31.79 kg/m  The patient appears well, alert, oriented, in no distress.  BREAST EXAM: breasts appear normal, no suspicious masses, no skin or nipple changes or axillary nodes  PELVIC EXAM: VULVA: normal appearing vulva with atrophic change, no masses, tenderness or lesions, VAGINA: normal appearing vagina with atrophic change, normal color and discharge, no lesions, CERVIX: surgically absent, UTERUS: surgically absent, vaginal cuff normal, ADNEXA: no masses, nontender  Chaperone: Kennon Portela present during the examination  ASSESSMENT:  79 y.o. G1P0010 here for a breast and pelvic exam  PLAN:   1. Postmenopausal. Prior TAH/BSO for  pelvic pain and adhesions.  No significant menopausal symptoms. 2. Pap smear 2011.  History of dysplasia prior to the hysterectomy, normal Pap smears since.  She is comfortable with not screening based on the current guidelines and age criteria.  3. Mammogram 10/2019.  Normal breast exam today.  She had calcifications identified in her left breast, she says she had a negative biopsy.  Follow-up breast imaging in 6 months per radiology recommendations as she indicates. 4. Colonoscopy 2017.  She will follow up at the interval recommended by her GI specialist.   5. Osteopenia.  DEXA 01/2019.  T score -1.4 at the AP spine.  FRAX 4.4% / 0.7%.  Encouraged vitamin D and dietary calcium 1200 mg/day with weightbearing exercise.  Next DEXA recommended 2023. 6. Health maintenance.  No labs today as she normally has these completed elsewhere.  Return annually or sooner, prn.  Theresia Majors MD 12/16/19

## 2020-01-11 ENCOUNTER — Other Ambulatory Visit: Payer: Self-pay

## 2020-01-11 ENCOUNTER — Encounter: Payer: Self-pay | Admitting: Family Medicine

## 2020-01-11 ENCOUNTER — Ambulatory Visit (INDEPENDENT_AMBULATORY_CARE_PROVIDER_SITE_OTHER): Payer: Medicare Other | Admitting: Family Medicine

## 2020-01-11 VITALS — BP 142/72 | HR 68 | Ht 67.0 in | Wt 202.0 lb

## 2020-01-11 DIAGNOSIS — E876 Hypokalemia: Secondary | ICD-10-CM

## 2020-01-11 DIAGNOSIS — I10 Essential (primary) hypertension: Secondary | ICD-10-CM

## 2020-01-11 LAB — BASIC METABOLIC PANEL
BUN: 14 mg/dL (ref 6–23)
CO2: 29 mEq/L (ref 19–32)
Calcium: 9.4 mg/dL (ref 8.4–10.5)
Chloride: 106 mEq/L (ref 96–112)
Creatinine, Ser: 0.97 mg/dL (ref 0.40–1.20)
GFR: 56.31 mL/min — ABNORMAL LOW (ref >60.00)
Glucose, Bld: 76 mg/dL (ref 70–99)
Potassium: 4.4 mEq/L (ref 3.5–5.1)
Sodium: 141 mEq/L (ref 135–145)

## 2020-01-11 LAB — HEPATIC FUNCTION PANEL
ALT: 16 U/L (ref 0–35)
AST: 21 U/L (ref 0–37)
Albumin: 4.2 g/dL (ref 3.5–5.2)
Alkaline Phosphatase: 89 U/L (ref 39–117)
Bilirubin, Direct: 0.1 mg/dL (ref 0.0–0.3)
Total Bilirubin: 0.3 mg/dL (ref 0.2–1.2)
Total Protein: 7.3 g/dL (ref 6.0–8.3)

## 2020-01-11 NOTE — Patient Instructions (Signed)
Potassium Content of Foods  Potassium is a mineral found in many foods and drinks. It affects how the heart works, and helps keep fluids and minerals balanced in the body. The amount of potassium you need each day depends on your age and any medical conditions you may have. Talk to your health care provider or dietitian about how much potassium you need. The following lists of foods provide the general serving size for foods and the approximate amount of potassium in each serving, listed in milligrams (mg). Actual values may vary depending on the product and how it is processed. High in potassium The following foods and beverages have 200 mg or more of potassium per serving:  Apricots (raw) - 2 have 200 mg of potassium.  Apricots (dry) - 5 have 200 mg of potassium.  Artichoke - 1 medium has 345 mg of potassium.  Avocado -  fruit has 245 mg of potassium.  Banana - 1 medium fruit has 425 mg of potassium.  Lima or baked beans (canned) -  cup has 280 mg of potassium.  White beans (canned) -  cup has 595 mg potassium.  Beef roast - 3 oz has 320 mg of potassium.  Ground beef - 3 oz has 270 mg of potassium.  Beets (raw or cooked) -  cup has 260 mg of potassium.  Bran muffin - 2 oz has 300 mg of potassium.  Broccoli (cooked) -  cup has 230 mg of potassium.  Brussels sprouts -  cup has 250 mg of potassium.  Cantaloupe -  cup has 215 mg of potassium.  Cereal, 100% bran -  cup has 200-400 mg of potassium.  Cheeseburger -1 single fast food burger has 225-400 mg of potassium.  Chicken - 3 oz has 220 mg of potassium.  Clams (canned) - 3 oz has 535 mg of potassium.  Crab - 3 oz has 225 mg of potassium.  Dates - 5 have 270 mg of potassium.  Dried beans and peas -  cup has 300-475 mg of potassium.  Figs (dried) - 2 have 260 mg of potassium.  Fish (halibut, tuna, cod, snapper) - 3 oz has 480 mg of potassium.  Fish (salmon, haddock, swordfish, perch) - 3 oz has 300 mg of  potassium.  Fish (tuna, canned) - 3 oz has 200 mg of potassium.  French fries (fast food) - 3 oz has 470 mg of potassium.  Granola with fruit and nuts -  cup has 200 mg of potassium.  Grapefruit juice -  cup has 200 mg of potassium.  Honeydew melon -  cup has 200 mg of potassium.  Kale (raw) - 1 cup has 300 mg of potassium.  Kiwi - 1 medium fruit has 240 mg of potassium.  Kohlrabi, rutabaga, parsnips -  cup has 280 mg of potassium.  Lentils -  cup has 365 mg of potassium.  Mango - 1 each has 325 mg of potassium.  Milk (nonfat, low-fat, whole, buttermilk) - 1 cup has 350-380 mg of potassium.  Milk (chocolate) - 1 cup has 420 mg of potassium  Molasses - 1 Tbsp has 295 mg of potassium.  Mushrooms -  cup has 280 mg of potassium.  Nectarine - 1 each has 275 mg of potassium.  Nuts (almonds, peanuts, hazelnuts, [place], cashew, mixed) - 1 oz has 200 mg of potassium.  Nuts (pistachios) - 1 oz has 295 mg of potassium.  Orange - 1 fruit has 240 mg of potassium.  Orange juice -    cup has 235 mg of potassium.  Papaya -  medium fruit has 390 mg of potassium.  Peanut butter (chunky) - 2 Tbsp has 240 mg of potassium.  Peanut butter (smooth) - 2 Tbsp has 210 mg of potassium.  Pear - 1 medium (200 mg) of potassium.  Pomegranate - 1 whole fruit has 400 mg of potassium.  Pomegranate juice -  cup has 215 mg of potassium.  Pork - 3 oz has 350 mg of potassium.  Potato chips (salted) - 1 oz has 465 mg of potassium.  Potato (baked with skin) - 1 medium has 925 mg of potassium.  Potato (boiled) -  cup has 255 mg of potassium.  Potato (Mashed) -  cup has 330 mg of potassium.  Prune juice -  cup has 370 mg of potassium.  Prunes - 5 have 305 mg of potassium.  Pudding (chocolate) -  cup has 230 mg of potassium.  Pumpkin (canned) -  cup has 250 mg of potassium.  Raisins (seedless) -  cup has 270 mg of potassium.  Seeds (sunflower or pumpkin) - 1 oz has 240 mg of  potassium.  Soy milk - 1 cup has 300 mg of potassium.  Spinach (cooked) - 1/2 cup has 420 mg of potassium.  Spinach (canned) -  cup has 370 mg of potassium.  Sweet potato (baked with skin) - 1 medium has 450 mg of potassium.  Swiss chard -  cup has 480 mg of potassium.  Tomato or vegetable juice -  cup has 275 mg of potassium.  Tomato (sauce or puree) -  cup has 400-550 mg of potassium.  Tomato (raw) - 1 medium has 290 mg of potassium.  Tomato (canned) -  cup has 200-300 mg of potassium.  Turkey - 3 oz has 250 mg of potassium.  Wheat germ - 1 oz has 250 mg of potassium.  Winter squash -  cup has 250 mg of potassium.  Yogurt (plain or fruited) - 6 oz has 260-435 mg of potassium.  Zucchini -  cup has 220 mg of potassium. Moderate in potassium The following foods and beverages have 50-200 mg of potassium per serving:  Apple - 1 fruit has 150 mg of potassium  Apple juice -  cup has 150 mg of potassium  Applesauce -  cup has 90 mg of potassium  Apricot nectar -  cup has 140 mg of potassium  Asparagus (small spears) -  cup has 155 mg of potassium  Asparagus (large spears) - 6 have 155 mg of potassium  Bagel (cinnamon raisin) - 1 four-inch bagel has 130 mg of potassium  Bagel (egg or plain) - 1 four- inch bagel has 70 mg of potassium  Beans (green) -  cup has 90 mg of potassium  Beans (yellow) -  cup has 190 mg of potassium  Beer, regular - 12 oz has 100 mg of potassium  Beets (canned) -  cup has 125 mg of potassium  Blackberries -  cup has 115 mg of potassium  Blueberries -  cup has 60 mg of potassium  Bread (whole wheat) - 1 slice has 70 mg of potassium  Broccoli (raw) -  cup has 145 mg of potassium  Cabbage -  cup has 150 mg of potassium  Carrots (cooked or raw) -  cup has 180 mg of potassium  Cauliflower (raw) -  cup has 150 mg of potassium  Celery (raw) -  cup has 155 mg of potassium  Cereal, bran   flakes -  cup has 120-150 mg  of potassium  Cheese (cottage) -  cup has 110 mg of potassium  Cherries - 10 have 150 mg of potassium  Chocolate - 1 oz bar has 165 mg of potassium  Coffee (brewed) - 6 oz has 90 mg of potassium  Corn -  cup or 1 ear has 195 mg of potassium  Cucumbers -  cup has 80 mg of potassium  Egg - 1 large egg has 60 mg of potassium  Eggplant -  cup has 60 mg of potassium  Endive (raw) -  cup has 80 mg of potassium  English muffin - 1 has 65 mg of potassium  Fish (ocean perch) - 3 oz has 192 mg of potassium  Frankfurter, beef or pork - 1 has 75 mg of potassium  Fruit cocktail -  cup has 115 mg of potassium  Grape juice -  cup has 170 mg of potassium  Grapefruit -  fruit has 175 mg of potassium  Grapes -  cup has 155 mg of potassium  Greens: kale, turnip, collard -  cup has 110-150 mg of potassium  Ice cream or frozen yogurt (chocolate) -  cup has 175 mg of potassium  Ice cream or frozen yogurt (vanilla) -  cup has 120-150 mg of potassium  Lemons, limes - 1 each has 80 mg of potassium  Lettuce - 1 cup has 100 mg of potassium  Mixed vegetables -  cup has 150 mg of potassium  Mushrooms, raw -  cup has 110 mg of potassium  Nuts (walnuts, pecans, or macadamia) - 1 oz has 125 mg of potassium  Oatmeal -  cup has 80 mg of potassium  Okra -  cup has 110 mg of potassium  Onions -  cup has 120 mg of potassium  Peach - 1 has 185 mg of potassium  Peaches (canned) -  cup has 120 mg of potassium  Pears (canned) -  cup has 120 mg of potassium  Peas, green (frozen) -  cup has 90 mg of potassium  Peppers (Green) -  cup has 130 mg of potassium  Peppers (Red) -  cup has 160 mg of potassium  Pineapple juice -  cup has 165 mg of potassium  Pineapple (fresh or canned) -  cup has 100 mg of potassium  Plums - 1 has 105 mg of potassium  Pudding, vanilla -  cup has 150 mg of potassium  Raspberries -  cup has 90 mg of potassium  Rhubarb -  cup has  115 mg of potassium  Rice, wild -  cup has 80 mg of potassium  Shrimp - 3 oz has 155 mg of potassium  Spinach (raw) - 1 cup has 170 mg of potassium  Strawberries -  cup has 125 mg of potassium  Summer squash -  cup has 175-200 mg of potassium  Swiss chard (raw) - 1 cup has 135 mg of potassium  Tangerines - 1 fruit has 140 mg of potassium  Tea, brewed - 6 oz has 65 mg of potassium  Turnips -  cup has 140 mg of potassium  Watermelon -  cup has 85 mg of potassium  Wine (Red, table) - 5 oz has 180 mg of potassium  Wine (White, table) - 5 oz 100 mg of potassium Low in potassium The following foods and beverages have less than 50 mg of potassium per serving.  Bread (white) - 1 slice has 30 mg of potassium    Carbonated beverages - 12 oz has less than 5 mg of potassium  Cheese - 1 oz has 20-30 mg of potassium  Cranberries -  cup has 45 mg of potassium  Cranberry juice cocktail -  cup has 20 mg of potassium  Fats and oils - 1 Tbsp has less than 5 mg of potassium  Hummus - 1 Tbsp has 32 mg of potassium  Nectar (papaya, mango, or pear) -  cup has 35 mg of potassium  Rice (white or brown) -  cup has 50 mg of potassium  Spaghetti or macaroni (cooked) -  cup has 30 mg of potassium  Tortilla, flour or corn - 1 has 50 mg of potassium  Waffle - 1 four-inch waffle has 50 mg of potassium  Water chestnuts -  cup has 40 mg of potassium Summary  Potassium is a mineral found in many foods and drinks. It affects how the heart works, and helps keep fluids and minerals balanced in the body.  The amount of potassium you need each day depends on your age and any existing medical conditions you may have. Your health care provider or dietitian may recommend an amount of potassium that you should have each day. This information is not intended to replace advice given to you by your health care provider. Make sure you discuss any questions you have with your health care  provider. Document Revised: 12/13/2016 Document Reviewed: 03/27/2016 Elsevier Patient Education  2020 [employer] Inc[employer]   

## 2020-01-11 NOTE — Progress Notes (Signed)
Established Patient Office Visit  Subjective:  Patient ID: Margaret Mitchell, female    DOB: 1940-07-24  Age: 79 y.o. MRN: [id number]  CC:  Chief Complaint  Patient presents with  . Follow-up    HPI Margaret Mitchell presents for medical follow-up.  She has history of hypertension, GERD, irritable bowel syndrome, chronic idiopathic neuropathy.  She recently saw cardiology.  She had lab work significant for potassium 3.4 and calcium 8.7.  This was not corrected as there is no albumin done.  She states that back in 1981 she had what sounds like hypercalcemia.  She apparently had primary hyperparathyroidism and had surgery to remove parathyroid at that time.  She took calcium for some time but not consistently at this time.  Her current regular medications include Lotrel and metoprolol.  She does not take any diuretics.  Has not generally had hypokalemia issues in the past.  Denies any recent nausea, vomiting, or diarrhea.  Blood pressure has been stable.  No recent dizziness.  Past Medical History:  Diagnosis Date  . Allergy, unspecified not elsewhere classified   . Anxiety   . Arthritis   . Cervical dysplasia    prior to hysterectomy  . GERD (gastroesophageal reflux disease)   . Headache(784.0)   . Hypertension   . IBS (irritable bowel syndrome)   . Osteopenia 12/2015   T score -1.1 FRAX 3.7%/0.4%  . Pelvic adhesions   . Pelvic pain in female   . Sinus infection   . Thyroid disease    Parathyroid cyst    Past Surgical History:  Procedure Laterality Date  . ABDOMINAL HYSTERECTOMY  1983   TAH/BSO for pelvic pain and adhesions  . APPENDECTOMY  1994  . BREAST SURGERY     Biopsy-Benign  . CATARACT EXTRACTION Bilateral 2013  . COLPOSCOPY    . FOOT SURGERY Bilateral   . PARATHYROID ADENOMA REMOVED  02/1980  . ROTATOR CUFF REPAIR     X2  . SHOULDER INJECTION  10/2011  . TONSILLECTOMY    . TUBAL LIGATION  1981  . VESICOVAGINAL FISTULA CLOSURE W/ TAH      Family History   Problem Relation Age of Onset  . Diabetes Mother   . Lung cancer Father   . Leukemia Brother   . Hypertension Maternal Grandmother   . Heart disease Maternal Grandmother   . Diabetes Maternal Grandmother   . Stroke Maternal Grandmother   . Diabetes Maternal Aunt   . Heart failure Paternal Grandmother     Social History   Socioeconomic History  . Marital status: Widowed    Spouse name: Charlann Noss  . Number of children: 1  . Years of education: Not on file  . Highest education level: Not on file  Occupational History  . Occupation: retired    Associate Professor: RETIRED  Tobacco Use  . Smoking status: Never Smoker  . Smokeless tobacco: Never Used  Vaping Use  . Vaping Use: Never used  Substance and Sexual Activity  . Alcohol use: Yes    Comment: Rare  . Drug use: No  . Sexual activity: Yes    Birth control/protection: Surgical    Comment: 1st intercourse 79 yo-Fewer than 5 partners  Other Topics Concern  . Not on file  Social History Narrative   Lives with husband.   Retired from working Barrister's clerk at [employer]   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   Right handed   One story home   Social Determinants  of Health   Financial Resource Strain: Low Risk   . Difficulty of Paying Living Expenses: Not hard at all  Food Insecurity: No Food Insecurity  . Worried About Programme researcher, broadcasting/film/video in the Last Year: Never true  . Ran Out of Food in the Last Year: Never true  Transportation Needs: No Transportation Needs  . Lack of Transportation (Medical): No  . Lack of Transportation (Non-Medical): No  Physical Activity: Inactive  . Days of Exercise per Week: 0 days  . Minutes of Exercise per Session: 0 min  Stress: No Stress Concern Present  . Feeling of Stress : Not at all  Social Connections: Moderately Isolated  . Frequency of Communication with Friends and Family: More than three times a week  . Frequency of Social Gatherings with Friends and Family: More than  three times a week  . Attends Religious Services: More than 4 times per year  . Active Member of Clubs or Organizations: No  . Attends Banker Meetings: Never  . Marital Status: Widowed  Intimate Partner Violence: Not At Risk  . Fear of Current or Ex-Partner: No  . Emotionally Abused: No  . Physically Abused: No  . Sexually Abused: No    Outpatient Medications Prior to Visit  Medication Sig Dispense Refill  . amLODipine-benazepril (LOTREL) 5-20 MG capsule TAKE 1 CAPSULE DAILY 90 capsule 3  . aspirin 81 MG tablet Take 81 mg by mouth daily.    . metoprolol succinate (TOPROL XL) 50 MG 24 hr tablet Take 1 tablet (50 mg total) by mouth daily. Take with or immediately following a meal. 90 tablet 1  . Multiple Vitamin (MULTIVITAMIN) tablet Take 1 tablet by mouth daily.     No facility-administered medications prior to visit.    Allergies  Allergen Reactions  . Other     Seldane  . Prednisone     REACTION: heart palpatations  In larger doses  . Seldane [Terfenadine]     SELDANE  . Sulfa Antibiotics Other (See Comments)    REACTION: pt unable to remember REACTION: pt unable to remember  . Sulfasalazine Other (See Comments)    REACTION: pt unable to remember  . Sulfonamide Derivatives     REACTION: pt unable to remember  . Vioxx [Rofecoxib]     REACTION: mouth swelling (VIOXX)    ROS Review of Systems  Constitutional: Negative for fatigue and unexpected weight change.  Eyes: Negative for visual disturbance.  Respiratory: Negative for cough, chest tightness, shortness of breath and wheezing.   Cardiovascular: Negative for chest pain, palpitations and leg swelling.  Endocrine: Negative for polydipsia and polyuria.  Neurological: Negative for dizziness, seizures, syncope, weakness, light-headedness and headaches.      Objective:    Physical Exam Vitals reviewed.  Constitutional:      Appearance: Normal appearance.  Cardiovascular:     Rate and Rhythm: Normal  rate and regular rhythm.  Pulmonary:     Effort: Pulmonary effort is normal.     Breath sounds: Normal breath sounds.  Neurological:     Mental Status: She is alert.     BP (!) 142/72   Pulse 68   Ht 5\' 7"  (1.702 m)   Wt 202 lb (91.6 kg)   SpO2 99%   BMI 31.64 kg/m  Wt Readings from Last 3 Encounters:  01/11/20 202 lb (91.6 kg)  12/16/19 203 lb (92.1 kg)  12/06/19 205 lb 9.6 oz (93.3 kg)     Health Maintenance Due  Topic Date Due  . Hepatitis C Screening  Never done    There are no preventive care reminders to display for this patient.  Lab Results  Component Value Date   TSH 2.820 04/08/2019   Lab Results  Component Value Date   WBC 5.2 11/15/2019   HGB 13.4 11/15/2019   HCT 40.7 11/15/2019   MCV 95.5 11/15/2019   PLT 254 11/15/2019   Lab Results  Component Value Date   NA 142 11/15/2019   K 3.4 (L) 11/15/2019   CO2 26 11/15/2019   GLUCOSE 97 11/15/2019   BUN 13 11/15/2019   CREATININE 0.90 11/15/2019   BILITOT 0.4 04/08/2019   ALKPHOS 80 04/08/2019   AST 23 04/08/2019   ALT 16 04/08/2019   PROT 6.9 04/08/2019   ALBUMIN 4.3 04/08/2019   CALCIUM 8.7 (L) 11/15/2019   ANIONGAP 8 11/15/2019   GFR 63.74 06/16/2018   Lab Results  Component Value Date   CHOL 178 04/08/2019   Lab Results  Component Value Date   HDL 72 04/08/2019   Lab Results  Component Value Date   LDLCALC 83 04/08/2019   Lab Results  Component Value Date   TRIG 137 04/08/2019   Lab Results  Component Value Date   CHOLHDL 2.5 04/08/2019   Lab Results  Component Value Date   HGBA1C 6.1 05/03/2015      Assessment & Plan:   Problem List Items Addressed This Visit      Unprioritized   Essential hypertension - Primary    Other Visit Diagnoses    Hypokalemia       Relevant Orders   Basic metabolic panel   Hypocalcemia       Relevant Orders   Hepatic function panel    -Recheck basic metabolic panel and hepatic panel to check albumin to correct  calcium.  -Handout given on good sources of dietary potassium to focus on  -If calcium is dropping we will discuss regular supplementation  -Routine follow-up in 6 months and sooner as needed  No orders of the defined types were placed in this encounter.   Follow-up: Return in about 6 months (around 07/11/2020).    Evelena Peat, MD

## 2020-01-13 DIAGNOSIS — Z03818 Encounter for observation for suspected exposure to other biological agents ruled out: Secondary | ICD-10-CM | POA: Diagnosis not present

## 2020-02-28 ENCOUNTER — Other Ambulatory Visit: Payer: Self-pay | Admitting: Family Medicine

## 2020-04-13 ENCOUNTER — Other Ambulatory Visit: Payer: Self-pay

## 2020-04-14 ENCOUNTER — Encounter: Payer: Self-pay | Admitting: Family Medicine

## 2020-04-14 ENCOUNTER — Ambulatory Visit (INDEPENDENT_AMBULATORY_CARE_PROVIDER_SITE_OTHER): Payer: Medicare Other | Admitting: Family Medicine

## 2020-04-14 VITALS — BP 130/72 | HR 65 | Temp 97.9°F | Wt 199.2 lb

## 2020-04-14 DIAGNOSIS — S39012A Strain of muscle, fascia and tendon of lower back, initial encounter: Secondary | ICD-10-CM | POA: Diagnosis not present

## 2020-04-14 MED ORDER — METHOCARBAMOL 500 MG PO TABS: 500.0000 mg | ORAL_TABLET | Freq: Three times a day (TID) | ORAL | 0 refills | Status: DC | PRN

## 2020-04-14 NOTE — Patient Instructions (Signed)
Lumbar Strain A lumbar strain, which is sometimes called a low-back strain, is a stretch or tear in a muscle or the strong cords of tissue that attach muscle to bone (tendons) in the lower back (lumbar spine). This type of injury occurs when muscles or tendons are torn or are stretched beyond their limits. Lumbar strains can range from mild to severe. Mild strains may involve stretching a muscle or tendon without tearing it. These may heal in 1-2 weeks. More severe strains involve tearing of muscle fibers or tendons. These will cause more pain and may take 6-8 weeks to heal. What are the causes? This condition may be caused by:  Trauma, such as a fall or a hit to the body.  Twisting or overstretching the back. This may result from doing activities that need a lot of energy, such as lifting heavy objects. What increases the risk? This injury is more common in:  Athletes.  People with obesity.  People who do repeated lifting, bending, or other movements that involve their back. What are the signs or symptoms? Symptoms of this condition may include:  Sharp or dull pain in the lower back that does not go away. The pain may extend to the buttocks.  Stiffness or limited range of motion.  Sudden muscle tightening (spasms). How is this diagnosed? This condition may be diagnosed based on:  Your symptoms.  Your medical history.  A physical exam.  Imaging tests, such as: ? X-rays. ? MRI. How is this treated? Treatment for this condition may include:  Rest.  Applying heat and cold to the affected area.  Over-the-counter medicines to help relieve pain and inflammation, such as NSAIDs.  Prescription pain medicine and muscle relaxants may be needed for a short time.  Physical therapy. Follow these instructions at home: Managing pain, stiffness, and swelling  If directed, put ice on the injured area during the first 24 hours after your injury. ? Put ice in a plastic bag. ? Place  a towel between your skin and the bag. ? Leave the ice on for 20 minutes, 2-3 times a day.  If directed, apply heat to the affected area as often as told by your health care provider. Use the heat source that your health care provider recommends, such as a moist heat pack or a heating pad. ? Place a towel between your skin and the heat source. ? Leave the heat on for 20-30 minutes. ? Remove the heat if your skin turns bright red. This is especially important if you are unable to feel pain, heat, or cold. You may have a greater risk of getting burned.      Activity  Rest and return to your normal activities as told by your health care provider. Ask your health care provider what activities are safe for you.  Do exercises as told by your health care provider. Medicines  Take over-the-counter and prescription medicines only as told by your health care provider.  Ask your health care provider if the medicine prescribed to you: ? Requires you to avoid driving or using heavy machinery. ? Can cause constipation. You may need to take these actions to prevent or treat constipation:  Drink enough fluid to keep your urine pale yellow.  Take over-the-counter or prescription medicines.  Eat foods that are high in fiber, such as beans, whole grains, and fresh fruits and vegetables.  Limit foods that are high in fat and processed sugars, such as fried or sweet foods. Injury prevention To   prevent a future low-back injury:  Always warm up properly before physical activity or sports.  Cool down and stretch after being active.  Use correct form when playing sports and lifting heavy objects. Bend your knees before you lift heavy objects.  Use good posture when sitting and standing.  Stay physically fit and keep a healthy weight. ? Do at least 150 minutes of moderate-intensity exercise each week, such as brisk walking or water aerobics. ? Do strength exercises at least 2 times each week.    General instructions  Do not use any products that contain nicotine or tobacco, such as cigarettes, e-cigarettes, and chewing tobacco. If you need help quitting, ask your health care provider.  Keep all follow-up visits as told by your health care provider. This is important. Contact a health care provider if:  Your back pain does not improve after 6 weeks of treatment.  Your symptoms get worse. Get help right away if:  Your back pain is severe.  You are unable to stand or walk.  You develop pain in your legs.  You develop weakness in your buttocks or legs.  You have difficulty controlling when you urinate or when you have a bowel movement. ? You have frequent, painful, or bloody urination. ? You have a temperature over 101.0F (38.3C) Summary  A lumbar strain, which is sometimes called a low-back strain, is a stretch or tear in a muscle or the strong cords of tissue that attach muscle to bone (tendons) in the lower back (lumbar spine).  This type of injury occurs when muscles or tendons are torn or are stretched beyond their limits.  Rest and return to your normal activities as told by your health care provider. If directed, apply heat and ice to the affected area as often as told by your health care provider.  Take over-the-counter and prescription medicines only as told by your health care provider.  Contact a health care provider if you have new or worsening symptoms. This information is not intended to replace advice given to you by your health care provider. Make sure you discuss any questions you have with your health care provider. Document Revised: 10/30/2017 Document Reviewed: 10/30/2017 Elsevier Patient Education  2021 [employer]  

## 2020-04-14 NOTE — Progress Notes (Signed)
Established Patient Office Visit  Subjective:  Patient ID: Margaret Mitchell, female    DOB: 12-02-1940  Age: 80 y.o. MRN: [id number]  CC:  Chief Complaint  Patient presents with  . Back Pain    X 3 days, mid back, no known injury    HPI Margaret Mitchell presents for acute visit for mid bilateral lower lumbar pain for 3 days now.  She feels that she may have injured this Monday night.  She was reaching over to turn on an electric blanket.  She felt a pulling sensation.  Tuesday morning when she woke up she noticed some bilateral lumbar pain.  No radiculitis symptoms.  Pain with pretty much any position.  She tried multiple things including Tylenol, lidocaine patch, ice, heat, Voltaren topically without much improvement.  Pain is moderate.  Not sleeping well secondary to discomfort.  Past Medical History:  Diagnosis Date  . Allergy, unspecified not elsewhere classified   . Anxiety   . Arthritis   . Cervical dysplasia    prior to hysterectomy  . GERD (gastroesophageal reflux disease)   . Headache(784.0)   . Hypertension   . IBS (irritable bowel syndrome)   . Osteopenia 12/2015   T score -1.1 FRAX 3.7%/0.4%  . Pelvic adhesions   . Pelvic pain in female   . Sinus infection   . Thyroid disease    Parathyroid cyst    Past Surgical History:  Procedure Laterality Date  . ABDOMINAL HYSTERECTOMY  1983   TAH/BSO for pelvic pain and adhesions  . APPENDECTOMY  1994  . BREAST SURGERY     Biopsy-Benign  . CATARACT EXTRACTION Bilateral 2013  . COLPOSCOPY    . FOOT SURGERY Bilateral   . PARATHYROID ADENOMA REMOVED  02/1980  . ROTATOR CUFF REPAIR     X2  . SHOULDER INJECTION  10/2011  . TONSILLECTOMY    . TUBAL LIGATION  1981  . VESICOVAGINAL FISTULA CLOSURE W/ TAH      Family History  Problem Relation Age of Onset  . Diabetes Mother   . Lung cancer Father   . Leukemia Brother   . Hypertension Maternal Grandmother   . Heart disease Maternal Grandmother   . Diabetes  Maternal Grandmother   . Stroke Maternal Grandmother   . Diabetes Maternal Aunt   . Heart failure Paternal Grandmother     Social History   Socioeconomic History  . Marital status: Widowed    Spouse name: Charlann Noss  . Number of children: 1  . Years of education: Not on file  . Highest education level: Not on file  Occupational History  . Occupation: retired    Associate Professor: RETIRED  Tobacco Use  . Smoking status: Never Smoker  . Smokeless tobacco: Never Used  Vaping Use  . Vaping Use: Never used  Substance and Sexual Activity  . Alcohol use: Yes    Comment: Rare  . Drug use: No  . Sexual activity: Yes    Birth control/protection: Surgical    Comment: 1st intercourse 80 yo-Fewer than 5 partners  Other Topics Concern  . Not on file  Social History Narrative   Lives with husband.   Retired from working Barrister's clerk at [employer]   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   Right handed   One story home   Social Determinants of Health   Financial Resource Strain: Low Risk   . Difficulty of Paying Living Expenses: Not hard at all  Food Insecurity: No  Food Insecurity  . Worried About Programme researcher, broadcasting/film/video in the Last Year: Never true  . Ran Out of Food in the Last Year: Never true  Transportation Needs: No Transportation Needs  . Lack of Transportation (Medical): No  . Lack of Transportation (Non-Medical): No  Physical Activity: Inactive  . Days of Exercise per Week: 0 days  . Minutes of Exercise per Session: 0 min  Stress: No Stress Concern Present  . Feeling of Stress : Not at all  Social Connections: Moderately Isolated  . Frequency of Communication with Friends and Family: More than three times a week  . Frequency of Social Gatherings with Friends and Family: More than three times a week  . Attends Religious Services: More than 4 times per year  . Active Member of Clubs or Organizations: No  . Attends Banker Meetings: Never  . Marital Status:  Widowed  Intimate Partner Violence: Not At Risk  . Fear of Current or Ex-Partner: No  . Emotionally Abused: No  . Physically Abused: No  . Sexually Abused: No    Outpatient Medications Prior to Visit  Medication Sig Dispense Refill  . amLODipine-benazepril (LOTREL) 5-20 MG capsule TAKE 1 CAPSULE DAILY 90 capsule 3  . aspirin 81 MG tablet Take 81 mg by mouth daily.    . metoprolol succinate (TOPROL-XL) 50 MG 24 hr tablet TAKE 1 TABLET DAILY WITH OR IMMEDIATELY FOLLOWING A MEAL 90 tablet 3  . Multiple Vitamin (MULTIVITAMIN) tablet Take 1 tablet by mouth daily.     No facility-administered medications prior to visit.    Allergies  Allergen Reactions  . Other     Seldane  . Prednisone     REACTION: heart palpatations  In larger doses  . Seldane [Terfenadine]     SELDANE  . Sulfa Antibiotics Other (See Comments)    REACTION: pt unable to remember REACTION: pt unable to remember  . Sulfasalazine Other (See Comments)    REACTION: pt unable to remember  . Sulfonamide Derivatives     REACTION: pt unable to remember  . Vioxx [Rofecoxib]     REACTION: mouth swelling (VIOXX)    ROS Review of Systems  Constitutional: Negative for appetite change, chills, fever and unexpected weight change.  Respiratory: Negative for shortness of breath.   Cardiovascular: Negative for chest pain.  Gastrointestinal: Negative for abdominal pain.  Genitourinary: Negative for dysuria.  Musculoskeletal: Positive for back pain.      Objective:    Physical Exam Vitals reviewed.  Constitutional:      Appearance: Normal appearance.  Cardiovascular:     Rate and Rhythm: Normal rate and regular rhythm.  Pulmonary:     Effort: Pulmonary effort is normal.     Breath sounds: Normal breath sounds.  Musculoskeletal:     Right lower leg: No edema.     Left lower leg: No edema.     Comments: Straight leg raise are negative bilaterally  Neurological:     Mental Status: She is alert.     Comments: No  focal strength deficits.  She has good strength with plantarflexion, dorsiflexion, knee extension bilaterally.  She has essentially no reflex in either knee and only trace Achilles bilaterally.     BP 130/72 (BP Location: Left Arm, Patient Position: Sitting, Cuff Size: Normal)   Pulse 65   Temp 97.9 F (36.6 C) (Oral)   Wt 199 lb 3.2 oz (90.4 kg)   SpO2 (!) 65%   BMI 31.20 kg/m  Wt Readings from Last 3 Encounters:  04/14/20 199 lb 3.2 oz (90.4 kg)  01/11/20 202 lb (91.6 kg)  12/16/19 203 lb (92.1 kg)     Health Maintenance Due  Topic Date Due  . Hepatitis C Screening  Never done    There are no preventive care reminders to display for this patient.  Lab Results  Component Value Date   TSH 2.820 04/08/2019   Lab Results  Component Value Date   WBC 5.2 11/15/2019   HGB 13.4 11/15/2019   HCT 40.7 11/15/2019   MCV 95.5 11/15/2019   PLT 254 11/15/2019   Lab Results  Component Value Date   NA 141 01/11/2020   K 4.4 01/11/2020   CO2 29 01/11/2020   GLUCOSE 76 01/11/2020   BUN 14 01/11/2020   CREATININE 0.97 01/11/2020   BILITOT 0.3 01/11/2020   ALKPHOS 89 01/11/2020   AST 21 01/11/2020   ALT 16 01/11/2020   PROT 7.3 01/11/2020   ALBUMIN 4.2 01/11/2020   CALCIUM 9.4 01/11/2020   ANIONGAP 8 11/15/2019   GFR 56.31 (L) 01/11/2020   Lab Results  Component Value Date   CHOL 178 04/08/2019   Lab Results  Component Value Date   HDL 72 04/08/2019   Lab Results  Component Value Date   LDLCALC 83 04/08/2019   Lab Results  Component Value Date   TRIG 137 04/08/2019   Lab Results  Component Value Date   CHOLHDL 2.5 04/08/2019   Lab Results  Component Value Date   HGBA1C 6.1 05/03/2015      Assessment & Plan:   Low back pain lumbar area bilateral.  Suspect acute muscular strain  -Continue with heat or ice for symptomatic relief and Tylenol -Caution with nonsteroidals. -She has sensation of muscle tightness and we discussed risk of muscle relaxers  including sedation.  We agreed to Robaxin 500 mg nightly but caution about sedation.  We discussed extension stretches.  If not improving with the above in 2 weeks be in touch and consider physical therapy  Meds ordered this encounter  Medications  . methocarbamol (ROBAXIN) 500 MG tablet    Sig: Take 1 tablet (500 mg total) by mouth every 8 (eight) hours as needed for muscle spasms.    Dispense:  15 tablet    Refill:  0    Follow-up: No follow-ups on file.    Evelena Peat, MD

## 2020-04-18 ENCOUNTER — Telehealth: Payer: Self-pay | Admitting: Family Medicine

## 2020-04-18 DIAGNOSIS — S39012A Strain of muscle, fascia and tendon of lower back, initial encounter: Secondary | ICD-10-CM

## 2020-04-18 NOTE — Telephone Encounter (Signed)
I recommend setting up physical therapy if no better.

## 2020-04-18 NOTE — Telephone Encounter (Signed)
Patient is calling and stated that she was seen last week for back pain that still hasn't gotten any  better with the muscle relaxer. Pt is requesting a call back, please advise. CB is [phone]

## 2020-04-18 NOTE — Telephone Encounter (Signed)
Patient has agreed to PT. Referral has been placed to go to [facility] per the patients request.

## 2020-04-18 NOTE — Telephone Encounter (Signed)
Please advise 

## 2020-04-27 DIAGNOSIS — M25551 Pain in right hip: Secondary | ICD-10-CM | POA: Diagnosis not present

## 2020-04-27 DIAGNOSIS — M545 Low back pain, unspecified: Secondary | ICD-10-CM | POA: Diagnosis not present

## 2020-05-02 ENCOUNTER — Other Ambulatory Visit (HOSPITAL_BASED_OUTPATIENT_CLINIC_OR_DEPARTMENT_OTHER): Payer: Self-pay

## 2020-05-02 ENCOUNTER — Ambulatory Visit: Payer: Medicare Other | Attending: Internal Medicine

## 2020-05-02 ENCOUNTER — Other Ambulatory Visit: Payer: Self-pay

## 2020-05-02 DIAGNOSIS — Z23 Encounter for immunization: Secondary | ICD-10-CM

## 2020-05-02 MED ORDER — PFIZER-BIONT COVID-19 VAC-TRIS 30 MCG/0.3ML IM SUSP
INTRAMUSCULAR | 0 refills | Status: DC
Start: 2020-05-02 — End: 2020-12-25
  Filled 2020-05-02: qty 0.3, 1d supply, fill #0

## 2020-05-02 NOTE — Progress Notes (Signed)
Covid-19 Vaccination Clinic  Name:  ALIXANDRA BIBA    MRN: [id number] DOB: May 23, 1940  05/02/2020  Ms. Wollman was observed post Covid-19 immunization for 15 minutes without incident. She was provided with Vaccine Information Sheet and instruction to access the V-Safe system.   Ms. Schoenecker was instructed to call 911 with any severe reactions post vaccine: Marland Kitchen Difficulty breathing  . Swelling of face and throat  . A fast heartbeat  . A bad rash all over body  . Dizziness and weakness   Immunizations Administered    Name Date Dose VIS Date Route   PFIZER Comrnaty(Gray TOP) Covid-19 Vaccine 05/02/2020  2:27 PM 0.3 mL 12/23/2019 Intramuscular   Manufacturer: [employer], [employer]   Lot: [id number]   NDC: [phone]

## 2020-05-04 DIAGNOSIS — M545 Low back pain, unspecified: Secondary | ICD-10-CM | POA: Diagnosis not present

## 2020-05-05 DIAGNOSIS — M545 Low back pain, unspecified: Secondary | ICD-10-CM | POA: Diagnosis not present

## 2020-05-09 ENCOUNTER — Encounter: Payer: Self-pay | Admitting: Obstetrics & Gynecology

## 2020-05-09 DIAGNOSIS — R928 Other abnormal and inconclusive findings on diagnostic imaging of breast: Secondary | ICD-10-CM | POA: Diagnosis not present

## 2020-05-09 DIAGNOSIS — M545 Low back pain, unspecified: Secondary | ICD-10-CM | POA: Diagnosis not present

## 2020-06-01 ENCOUNTER — Telehealth: Payer: Self-pay | Admitting: Family Medicine

## 2020-06-01 NOTE — Telephone Encounter (Signed)
Called patient to see if she still needed an appointment for her hand cramping.  No answer, left voicemail message saying to call back to make an appointment if needed.

## 2020-06-06 ENCOUNTER — Encounter: Payer: Self-pay | Admitting: Family Medicine

## 2020-06-06 ENCOUNTER — Other Ambulatory Visit: Payer: Self-pay

## 2020-06-06 ENCOUNTER — Ambulatory Visit (INDEPENDENT_AMBULATORY_CARE_PROVIDER_SITE_OTHER): Payer: Medicare Other | Admitting: Family Medicine

## 2020-06-06 VITALS — BP 140/80 | HR 74 | Temp 98.3°F | Wt 196.5 lb

## 2020-06-06 DIAGNOSIS — I10 Essential (primary) hypertension: Secondary | ICD-10-CM

## 2020-06-06 DIAGNOSIS — R252 Cramp and spasm: Secondary | ICD-10-CM | POA: Diagnosis not present

## 2020-06-06 DIAGNOSIS — K5900 Constipation, unspecified: Secondary | ICD-10-CM | POA: Diagnosis not present

## 2020-06-06 NOTE — Patient Instructions (Signed)
Constipation, Adult Constipation is when a person has fewer than three bowel movements in a week, has difficulty having a bowel movement, or has stools (feces) that are dry, hard, or larger than normal. Constipation may be caused by an underlying condition. It may become worse with age if a person takes certain medicines and does not take in enough fluids. Follow these instructions at home: Eating and drinking  Eat foods that have a lot of fiber, such as beans, whole grains, and fresh fruits and vegetables.  Limit foods that are low in fiber and high in fat and processed sugars, such as fried or sweet foods. These include french fries, hamburgers, cookies, candies, and soda.  Drink enough fluid to keep your urine pale yellow.   General instructions  Exercise regularly or as told by your health care provider. Try to do 150 minutes of moderate exercise each week.  Use the bathroom when you have the urge to go. Do not hold it in.  Take over-the-counter and prescription medicines only as told by your health care provider. This includes any fiber supplements.  During bowel movements: ? Practice deep breathing while relaxing the lower abdomen. ? Practice pelvic floor relaxation.  Watch your condition for any changes. Let your health care provider know about them.  Keep all follow-up visits as told by your health care provider. This is important. Contact a health care provider if:  You have pain that gets worse.  You have a fever.  You do not have a bowel movement after 4 days.  You vomit.  You are not hungry or you lose weight.  You are bleeding from the opening between the buttocks (anus).  You have thin, pencil-like stools. Get help right away if:  You have a fever and your symptoms suddenly get worse.  You leak stool or have blood in your stool.  Your abdomen is bloated.  You have severe pain in your abdomen.  You feel dizzy or you faint. Summary  Constipation is  when a person has fewer than three bowel movements in a week, has difficulty having a bowel movement, or has stools (feces) that are dry, hard, or larger than normal.  Eat foods that have a lot of fiber, such as beans, whole grains, and fresh fruits and vegetables.  Drink enough fluid to keep your urine pale yellow.  Take over-the-counter and prescription medicines only as told by your health care provider. This includes any fiber supplements. This information is not intended to replace advice given to you by your health care provider. Make sure you discuss any questions you have with your health care provider. Document Revised: 11/18/2018 Document Reviewed: 11/18/2018 Elsevier Patient Education  2021 [employer].  Try to get at least 25 grams of fiber per day  Walk a lot  Consider OTC Miralax as needed.  For cramps stay well hydrated.  Also could consider Vit E 800 IU daily and B6 30 mg and take up to one three times daily.

## 2020-06-06 NOTE — Progress Notes (Signed)
Established Patient Office Visit  Subjective:  Patient ID: Margaret Mitchell, female    DOB: [birth year]  Age: 80 y.o. MRN: [id number]  CC:  Chief Complaint  Patient presents with  . Follow-up    Hypertension,  Pt complains of full body cramps and constipation.     HPI Margaret Mitchell presents for multiple issues as follows  She states she has "full body cramps "frequently.  She has cramps in multiple muscles including hands, calves, feet.  These occur frequently at night but not exclusively at night.  She bought some recent over-the-counter quinine 500 mg but has not started this yet.  She feels like she is trying to hydrate well.  She occasionally has done warm baths and hot showers baby with some relief.  No diuretic use.  Electrolytes and magnesium levels checked within the past year or so were normal  Hypertension treated with Lotrel and metoprolol.  Blood pressures have generally been well controlled.  No recent dizziness.  Compliant with medications.  She has had some recent constipation symptoms.  Has been taking Colace but minimal improvement.  Sometimes goes several days without bowel movement.  Last colonoscopy 2017.  Past Medical History:  Diagnosis Date  . Allergy, unspecified not elsewhere classified   . Anxiety   . Arthritis   . Cervical dysplasia    prior to hysterectomy  . GERD (gastroesophageal reflux disease)   . Headache(784.0)   . Hypertension   . IBS (irritable bowel syndrome)   . Osteopenia 12/2015   T score -1.1 FRAX 3.7%/0.4%  . Pelvic adhesions   . Pelvic pain in female   . Sinus infection   . Thyroid disease    Parathyroid cyst    Past Surgical History:  Procedure Laterality Date  . ABDOMINAL HYSTERECTOMY  1983   TAH/BSO for pelvic pain and adhesions  . APPENDECTOMY  1994  . BREAST SURGERY     Biopsy-Benign  . CATARACT EXTRACTION Bilateral 2013  . COLPOSCOPY    . FOOT SURGERY Bilateral   . PARATHYROID ADENOMA REMOVED  02/1980  . ROTATOR  CUFF REPAIR     X2  . SHOULDER INJECTION  10/2011  . TONSILLECTOMY    . TUBAL LIGATION  1981  . VESICOVAGINAL FISTULA CLOSURE W/ TAH      Family History  Problem Relation Age of Onset  . Diabetes Mother   . Lung cancer Father   . Leukemia Brother   . Hypertension Maternal Grandmother   . Heart disease Maternal Grandmother   . Diabetes Maternal Grandmother   . Stroke Maternal Grandmother   . Diabetes Maternal Aunt   . Heart failure Paternal Grandmother     Social History   Socioeconomic History  . Marital status: Widowed    Spouse name: Charlann Noss  . Number of children: 1  . Years of education: Not on file  . Highest education level: Not on file  Occupational History  . Occupation: retired    Associate Professor: RETIRED  Tobacco Use  . Smoking status: Never Smoker  . Smokeless tobacco: Never Used  Vaping Use  . Vaping Use: Never used  Substance and Sexual Activity  . Alcohol use: Yes    Comment: Rare  . Drug use: No  . Sexual activity: Yes    Birth control/protection: Surgical    Comment: 1st intercourse 80 yo-Fewer than 5 partners  Other Topics Concern  . Not on file  Social History Narrative   Lives with husband.  Retired from working Barrister's clerk at [employer]   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   Right handed   One story home   Social Determinants of Health   Financial Resource Strain: Low Risk   . Difficulty of Paying Living Expenses: Not hard at all  Food Insecurity: No Food Insecurity  . Worried About Programme researcher, broadcasting/film/video in the Last Year: Never true  . Ran Out of Food in the Last Year: Never true  Transportation Needs: No Transportation Needs  . Lack of Transportation (Medical): No  . Lack of Transportation (Non-Medical): No  Physical Activity: Inactive  . Days of Exercise per Week: 0 days  . Minutes of Exercise per Session: 0 min  Stress: No Stress Concern Present  . Feeling of Stress : Not at all  Social Connections: Moderately  Isolated  . Frequency of Communication with Friends and Family: More than three times a week  . Frequency of Social Gatherings with Friends and Family: More than three times a week  . Attends Religious Services: More than 4 times per year  . Active Member of Clubs or Organizations: No  . Attends Banker Meetings: Never  . Marital Status: Widowed  Intimate Partner Violence: Not At Risk  . Fear of Current or Ex-Partner: No  . Emotionally Abused: No  . Physically Abused: No  . Sexually Abused: No    Outpatient Medications Prior to Visit  Medication Sig Dispense Refill  . amLODipine-benazepril (LOTREL) 5-20 MG capsule TAKE 1 CAPSULE DAILY 90 capsule 3  . aspirin 81 MG tablet Take 81 mg by mouth daily.    Marland Kitchen COVID-19 mRNA Vac-TriS, Pfizer, (PFIZER-BIONT COVID-19 VAC-TRIS) SUSP injection Inject into the muscle. 0.3 mL 0  . methocarbamol (ROBAXIN) 500 MG tablet Take 1 tablet (500 mg total) by mouth every 8 (eight) hours as needed for muscle spasms. 15 tablet 0  . metoprolol succinate (TOPROL-XL) 50 MG 24 hr tablet TAKE 1 TABLET DAILY WITH OR IMMEDIATELY FOLLOWING A MEAL 90 tablet 3  . Multiple Vitamin (MULTIVITAMIN) tablet Take 1 tablet by mouth daily.     No facility-administered medications prior to visit.    Allergies  Allergen Reactions  . Other     Seldane  . Prednisone     REACTION: heart palpatations  In larger doses  . Seldane [Terfenadine]     SELDANE  . Sulfa Antibiotics Other (See Comments)    REACTION: pt unable to remember REACTION: pt unable to remember  . Sulfasalazine Other (See Comments)    REACTION: pt unable to remember  . Sulfonamide Derivatives     REACTION: pt unable to remember  . Vioxx [Rofecoxib]     REACTION: mouth swelling (VIOXX)    ROS Review of Systems  Constitutional: Negative for fatigue.  Eyes: Negative for visual disturbance.  Respiratory: Negative for cough, chest tightness, shortness of breath and wheezing.   Cardiovascular:  Negative for chest pain, palpitations and leg swelling.  Neurological: Negative for dizziness, seizures, syncope, weakness, light-headedness and headaches.      Objective:    Physical Exam Constitutional:      Appearance: She is well-developed.  Eyes:     Pupils: Pupils are equal, round, and reactive to light.  Neck:     Thyroid: No thyromegaly.     Vascular: No JVD.  Cardiovascular:     Rate and Rhythm: Normal rate and regular rhythm.     Heart sounds: No gallop.   Pulmonary:  Effort: Pulmonary effort is normal. No respiratory distress.     Breath sounds: Normal breath sounds. No wheezing or rales.  Musculoskeletal:     Cervical back: Neck supple.  Neurological:     Mental Status: She is alert.     BP 140/80 (BP Location: Left Arm, Patient Position: Sitting, Cuff Size: Normal)   Pulse 74   Temp 98.3 F (36.8 C) (Oral)   Wt 196 lb 8 oz (89.1 kg)   SpO2 97%   BMI 30.78 kg/m  Wt Readings from Last 3 Encounters:  06/06/20 196 lb 8 oz (89.1 kg)  04/14/20 199 lb 3.2 oz (90.4 kg)  01/11/20 202 lb (91.6 kg)     Health Maintenance Due  Topic Date Due  . Hepatitis C Screening  Never done    There are no preventive care reminders to display for this patient.  Lab Results  Component Value Date   TSH 2.820 04/08/2019   Lab Results  Component Value Date   WBC 5.2 11/15/2019   HGB 13.4 11/15/2019   HCT 40.7 11/15/2019   MCV 95.5 11/15/2019   PLT 254 11/15/2019   Lab Results  Component Value Date   NA 141 01/11/2020   K 4.4 01/11/2020   CO2 29 01/11/2020   GLUCOSE 76 01/11/2020   BUN 14 01/11/2020   CREATININE 0.97 01/11/2020   BILITOT 0.3 01/11/2020   ALKPHOS 89 01/11/2020   AST 21 01/11/2020   ALT 16 01/11/2020   PROT 7.3 01/11/2020   ALBUMIN 4.2 01/11/2020   CALCIUM 9.4 01/11/2020   ANIONGAP 8 11/15/2019   GFR 56.31 (L) 01/11/2020   Lab Results  Component Value Date   CHOL 178 04/08/2019   Lab Results  Component Value Date   HDL 72  04/08/2019   Lab Results  Component Value Date   LDLCALC 83 04/08/2019   Lab Results  Component Value Date   TRIG 137 04/08/2019   Lab Results  Component Value Date   CHOLHDL 2.5 04/08/2019   Lab Results  Component Value Date   HGBA1C 6.1 05/03/2015      Assessment & Plan:   #1 frequent muscle cramps.  Previous electrolytes have been normal.  Discussed conservative management with good hydration.  Cautioned about use of quinine.  We discussed there is some evidence for use of B6 30 mg 3 times daily and vitamin E 800 international units nightly.  She will consider these. Also discussed warms baths prior to bedtime. Could consider low dose Gabapentin if all above fails.  #2 frequent constipation symptoms -Increase fluid and activity levels -Recommend goal fiber intake of 25 g daily -Recommend trial of MiraLAX 17 g by mouth once daily and be in touch if this is not adequate Avoid regular stimulant laxatives.  #3 hypertension.  Generally well controlled.  Continue Lotrel at current dosage and metoprolol   No orders of the defined types were placed in this encounter.   Follow-up: No follow-ups on file.    Evelena Peat, MD

## 2020-06-20 ENCOUNTER — Ambulatory Visit: Payer: Medicare Other | Attending: Internal Medicine

## 2020-06-20 DIAGNOSIS — Z20822 Contact with and (suspected) exposure to covid-19: Secondary | ICD-10-CM

## 2020-06-21 LAB — SARS-COV-2, NAA 2 DAY TAT

## 2020-06-21 LAB — NOVEL CORONAVIRUS, NAA: SARS-CoV-2, NAA: NOT DETECTED

## 2020-06-22 ENCOUNTER — Other Ambulatory Visit: Payer: Self-pay | Admitting: Family Medicine

## 2020-07-11 ENCOUNTER — Ambulatory Visit: Payer: Medicare Other | Admitting: Family Medicine

## 2020-08-11 ENCOUNTER — Telehealth (INDEPENDENT_AMBULATORY_CARE_PROVIDER_SITE_OTHER): Payer: Medicare Other | Admitting: Family Medicine

## 2020-08-11 ENCOUNTER — Encounter: Payer: Self-pay | Admitting: Family Medicine

## 2020-08-11 VITALS — BP 149/98 | Temp 93.7°F | Ht 67.0 in | Wt 191.6 lb

## 2020-08-11 DIAGNOSIS — J069 Acute upper respiratory infection, unspecified: Secondary | ICD-10-CM

## 2020-08-11 NOTE — Progress Notes (Signed)
Patient ID: Margaret Mitchell, female   DOB: Jul 06, 1940, 80 y.o.   MRN: [id number]   This visit type was conducted due to national recommendations for restrictions regarding the COVID-19 pandemic in an effort to limit this patient's exposure and mitigate transmission in our community.   Virtual Visit via Video Note  I connected with Awa Theroux on 08/11/20 at  2:00 PM EDT by a video enabled telemedicine application and verified that I am speaking with the correct person using two identifiers.  Location patient: home Location provider:work or home office Persons participating in the virtual visit: patient, provider  I discussed the limitations of evaluation and management by telemedicine and the availability of in person appointments. The patient expressed understanding and agreed to proceed.   HPI:  Ms. Gorley relates upper respiratory symptoms.  [place] and she developed symptoms about 8 days ago.  She has had some nasal congestion and cough.  No fever.  Mild sore throat initially but that is improved.  She thought this was just a "head cold ".  Has not done any COVID testing.  Just returned home last night.  She has taken Coricidin over-the-counter and also NyQuil at night.  This is helping her symptoms.  No significant dyspnea.   ROS: See pertinent positives and negatives per HPI.  Past Medical History:  Diagnosis Date   Allergy, unspecified not elsewhere classified    Anxiety    Arthritis    Cervical dysplasia    prior to hysterectomy   GERD (gastroesophageal reflux disease)    Headache(784.0)    Hypertension    IBS (irritable bowel syndrome)    Osteopenia 12/2015   T score -1.1 FRAX 3.7%/0.4%   Pelvic adhesions    Pelvic pain in female    Sinus infection    Thyroid disease    Parathyroid cyst    Past Surgical History:  Procedure Laterality Date   ABDOMINAL HYSTERECTOMY  1983   TAH/BSO for pelvic pain and adhesions   APPENDECTOMY   1994   BREAST SURGERY     Biopsy-Benign   CATARACT EXTRACTION Bilateral 2013   COLPOSCOPY     FOOT SURGERY Bilateral    PARATHYROID ADENOMA REMOVED  02/1980   ROTATOR CUFF REPAIR     X2   SHOULDER INJECTION  10/2011   TONSILLECTOMY     TUBAL LIGATION  1981   VESICOVAGINAL FISTULA CLOSURE W/ TAH      Family History  Problem Relation Age of Onset   Diabetes Mother    Lung cancer Father    Leukemia Brother    Hypertension Maternal Grandmother    Heart disease Maternal Grandmother    Diabetes Maternal Grandmother    Stroke Maternal Grandmother    Diabetes Maternal Aunt    Heart failure Paternal Grandmother     SOCIAL HX: Non-smoker   Current Outpatient Medications:    amLODipine-benazepril (LOTREL) 5-20 MG capsule, TAKE 1 CAPSULE DAILY, Disp: 90 capsule, Rfl: 3   aspirin 81 MG tablet, Take 81 mg by mouth daily., Disp: , Rfl:    COVID-19 mRNA Vac-TriS, Pfizer, (PFIZER-BIONT COVID-19 VAC-TRIS) SUSP injection, Inject into the muscle., Disp: 0.3 mL, Rfl: 0   methocarbamol (ROBAXIN) 500 MG tablet, Take 1 tablet (500 mg total) by mouth every 8 (eight) hours as needed for muscle spasms., Disp: 15 tablet, Rfl: 0   metoprolol succinate (TOPROL-XL) 50 MG 24 hr tablet, TAKE 1 TABLET DAILY WITH OR IMMEDIATELY FOLLOWING A  MEAL, Disp: 90 tablet, Rfl: 3   Multiple Vitamin (MULTIVITAMIN) tablet, Take 1 tablet by mouth daily., Disp: , Rfl:   EXAM:  VITALS per patient if applicable:  GENERAL: alert, oriented, appears well and in no acute distress  HEENT: atraumatic, conjunttiva clear, no obvious abnormalities on inspection of external nose and ears  NECK: normal movements of the head and neck  LUNGS: on inspection no signs of respiratory distress, breathing rate appears normal, no obvious gross SOB, gasping or wheezing  CV: no obvious cyanosis  MS: moves all visible extremities without noticeable abnormality  PSYCH/NEURO: pleasant and cooperative, no obvious depression or anxiety,  speech and thought processing grossly intact  ASSESSMENT AND PLAN:  Discussed the following assessment and plan:  Viral URI with cough we discussed the fact this still could be COVID but she is past 5-day window for antiviral drugs.  She has considered home testing.  Her symptoms are relatively mild at this time  [facility] of fluids and rest -Continue over-the-counter cough and congestion medications as needed -Follow-up immediately for any fever or increased shortness of breath     I discussed the assessment and treatment plan with the patient. The patient was provided an opportunity to ask questions and all were answered. The patient agreed with the plan and demonstrated an understanding of the instructions.   The patient was advised to call back or seek an in-person evaluation if the symptoms worsen or if the condition fails to improve as anticipated.     Evelena Peat, MD

## 2020-09-05 ENCOUNTER — Other Ambulatory Visit: Payer: Self-pay

## 2020-09-05 ENCOUNTER — Ambulatory Visit (INDEPENDENT_AMBULATORY_CARE_PROVIDER_SITE_OTHER): Payer: Medicare Other

## 2020-09-05 VITALS — BP 120/80 | HR 74 | Temp 98.0°F | Wt 192.9 lb

## 2020-09-05 DIAGNOSIS — Z0000 Encounter for general adult medical examination without abnormal findings: Secondary | ICD-10-CM | POA: Diagnosis not present

## 2020-09-05 NOTE — Patient Instructions (Addendum)
[name] , Thank you for taking time to come for your Medicare Wellness Visit. I appreciate your ongoing commitment to your health goals. Please review the following plan we discussed and let me know if I can assist you in the future.   Screening recommendations/referrals: Colonoscopy: Done 02/02/15 repeat per PCP evaluation Mammogram: Done 05/09/20 repeat every year Bone Density: Done 01/21/19 repeat every 2 years Recommended yearly ophthalmology/optometry visit for glaucoma screening and checkup Recommended yearly dental visit for hygiene and checkup  Vaccinations: Influenza vaccine: Due Pneumococcal vaccine: Completed  Tdap vaccine: Done 10/13/12 repeat every 10 years due 10/14/22 Shingles vaccine: Done 12/31/19   Covid-19:Completed 1/12, 1/30, 11/13/19 & 05/03/19  Advanced directives: Copies   Conditions/risks identified: Lose weight   Next appointment: Follow up in one year for your annual wellness visit    Preventive Care 70 Years and Older, Female Preventive care refers to lifestyle choices and visits with your health care provider that can promote health and wellness. What does preventive care include? A yearly physical exam. This is also called an annual well check. Dental exams once or twice a year. Routine eye exams. Ask your health care provider how often you should have your eyes checked. Personal lifestyle choices, including: Daily care of your teeth and gums. Regular physical activity. Eating a healthy diet. Avoiding tobacco and drug use. Limiting alcohol use. Practicing safe sex. Taking low-dose aspirin every day. Taking vitamin and mineral supplements as recommended by your health care provider. What happens during an annual well check? The services and screenings done by your health care provider during your annual well check will depend on your age, overall health, lifestyle risk factors, and family history of disease. Counseling  Your health care provider may  ask you questions about your: Alcohol use. Tobacco use. Drug use. Emotional well-being. Home and relationship well-being. Sexual activity. Eating habits. History of falls. Memory and ability to understand (cognition). Work and work Statistician. Reproductive health. Screening  You may have the following tests or measurements: Height, weight, and BMI. Blood pressure. Lipid and cholesterol levels. These may be checked every 5 years, or more frequently if you are over 37 years old. Skin check. Lung cancer screening. You may have this screening every year starting at age 52 if you have a 30-pack-year history of smoking and currently smoke or have quit within the past 15 years. Fecal occult blood test (FOBT) of the stool. You may have this test every year starting at age 71. Flexible sigmoidoscopy or colonoscopy. You may have a sigmoidoscopy every 5 years or a colonoscopy every 10 years starting at age 68. Hepatitis C blood test. Hepatitis B blood test. Sexually transmitted disease (STD) testing. Diabetes screening. This is done by checking your blood sugar (glucose) after you have not eaten for a while (fasting). You may have this done every 1-3 years. Bone density scan. This is done to screen for osteoporosis. You may have this done starting at age 30. Mammogram. This may be done every 1-2 years. Talk to your health care provider about how often you should have regular mammograms. Talk with your health care provider about your test results, treatment options, and if necessary, the need for more tests. Vaccines  Your health care provider may recommend certain vaccines, such as: Influenza vaccine. This is recommended every year. Tetanus, diphtheria, and acellular pertussis (Tdap, Td) vaccine. You may need a Td booster every 10 years. Zoster vaccine. You may need this after age 74. Pneumococcal 13-valent conjugate (PCV13)  vaccine. One dose is recommended after age 18. Pneumococcal  polysaccharide (PPSV23) vaccine. One dose is recommended after age 26. Talk to your health care provider about which screenings and vaccines you need and how often you need them. This information is not intended to replace advice given to you by your health care provider. Make sure you discuss any questions you have with your health care provider. Document Released: 01/27/2015 Document Revised: 09/20/2015 Document Reviewed: 11/01/2014 Elsevier Interactive Patient Education  2017 [place] Prevention in the Home Falls can cause injuries. They can happen to people of all ages. There are many things you can do to make your home safe and to help prevent falls. What can I do on the outside of my home? Regularly fix the edges of walkways and driveways and fix any cracks. Remove anything that might make you trip as you walk through a door, such as a raised step or threshold. Trim any bushes or trees on the path to your home. Use bright outdoor lighting. Clear any walking paths of anything that might make someone trip, such as rocks or tools. Regularly check to see if handrails are loose or broken. Make sure that both sides of any steps have handrails. Any raised decks and porches should have guardrails on the edges. Have any leaves, snow, or ice cleared regularly. Use sand or salt on walking paths during winter. Clean up any spills in your garage right away. This includes oil or grease spills. What can I do in the bathroom? Use night lights. Install grab bars by the toilet and in the tub and shower. Do not use towel bars as grab bars. Use non-skid mats or decals in the tub or shower. If you need to sit down in the shower, use a plastic, non-slip stool. Keep the floor dry. Clean up any water that spills on the floor as soon as it happens. Remove soap buildup in the tub or shower regularly. Attach bath mats securely with double-sided non-slip rug tape. Do not have throw rugs and other  things on the floor that can make you trip. What can I do in the bedroom? Use night lights. Make sure that you have a light by your bed that is easy to reach. Do not use any sheets or blankets that are too big for your bed. They should not hang down onto the floor. Have a firm chair that has side arms. You can use this for support while you get dressed. Do not have throw rugs and other things on the floor that can make you trip. What can I do in the kitchen? Clean up any spills right away. Avoid walking on wet floors. Keep items that you use a lot in easy-to-reach places. If you need to reach something above you, use a strong step stool that has a grab bar. Keep electrical cords out of the way. Do not use floor polish or wax that makes floors slippery. If you must use wax, use non-skid floor wax. Do not have throw rugs and other things on the floor that can make you trip. What can I do with my stairs? Do not leave any items on the stairs. Make sure that there are handrails on both sides of the stairs and use them. Fix handrails that are broken or loose. Make sure that handrails are as long as the stairways. Check any carpeting to make sure that it is firmly attached to the stairs. Fix any carpet that is loose  or worn. Avoid having throw rugs at the top or bottom of the stairs. If you do have throw rugs, attach them to the floor with carpet tape. Make sure that you have a light switch at the top of the stairs and the bottom of the stairs. If you do not have them, ask someone to add them for you. What else can I do to help prevent falls? Wear shoes that: Do not have high heels. Have rubber bottoms. Are comfortable and fit you well. Are closed at the toe. Do not wear sandals. If you use a stepladder: Make sure that it is fully opened. Do not climb a closed stepladder. Make sure that both sides of the stepladder are locked into place. Ask someone to hold it for you, if possible. Clearly  mark and make sure that you can see: Any grab bars or handrails. First and last steps. Where the edge of each step is. Use tools that help you move around (mobility aids) if they are needed. These include: Canes. Walkers. Scooters. Crutches. Turn on the lights when you go into a dark area. Replace any light bulbs as soon as they burn out. Set up your furniture so you have a clear path. Avoid moving your furniture around. If any of your floors are uneven, fix them. If there are any pets around you, be aware of where they are. Review your medicines with your doctor. Some medicines can make you feel dizzy. This can increase your chance of falling. Ask your doctor what other things that you can do to help prevent falls. This information is not intended to replace advice given to you by your health care provider. Make sure you discuss any questions you have with your health care provider. Document Released: 10/27/2008 Document Revised: 06/08/2015 Document Reviewed: 02/04/2014 Elsevier Interactive Patient Education  2017 [employer].

## 2020-09-05 NOTE — Progress Notes (Addendum)
Subjective:   Margaret Mitchell is a 80 y.o. female who presents for Medicare Annual (Subsequent) preventive examination.  Review of Systems     Cardiac Risk Factors include: hypertension;obesity (BMI >30kg/m2);advanced age (>37mn, >>56women)     Objective:    Today's Vitals   09/05/20 1142  BP: 120/80  Pulse: 74  Temp: 98 F (36.7 C)  SpO2: 98%  Weight: 192 lb 14.4 oz (87.5 kg)   Body mass index is 30.21 kg/m.  Advanced Directives 09/05/2020 11/15/2019 09/02/2019 08/23/2019 06/06/2017 02/07/2016 02/02/2015  Does Patient Have a Medical Advance Directive? Yes Yes Yes Yes Yes Yes No  Type of [place]aramed[place]of [place] will - - - -  Does patient want to make changes to medical advance directive? - - No - Patient declined - - - -  Copy of [place] Chart? Yes - validated most recent copy scanned in chart (See row information) - Yes - validated most recent copy scanned in chart (See row information) - - - -  Would patient like information on creating a medical advance directive? - - - - - - No - patient declined information    Current Medications (verified) Outpatient Encounter Medications as of 09/05/2020  Medication Sig   amLODipine-benazepril (LOTREL) 5-20 MG capsule TAKE 1 CAPSULE DAILY   aspirin 81 MG tablet Take 81 mg by mouth daily.   metoprolol succinate (TOPROL-XL) 50 MG 24 hr tablet TAKE 1 TABLET DAILY WITH OR IMMEDIATELY FOLLOWING A MEAL   Multiple Vitamin (MULTIVITAMIN) tablet Take 1 tablet by mouth daily.   Zoster Vaccine Adjuvanted ([facility] injection    COVID-19 mRNA Vac-TriS, Pfizer, (PFIZER-BIONT COVID-19 VAC-TRIS) SUSP injection Inject into the muscle.   [DISCONTINUED] methocarbamol (ROBAXIN) 500 MG tablet Take 1 tablet (500 mg total) by mouth every 8 (eight) hours as needed for muscle spasms.   No facility-administered encounter medications on file  as of 09/05/2020.    Allergies (verified) Other, Prednisone, Seldane [terfenadine], Sulfa antibiotics, Sulfasalazine, Sulfonamide derivatives, and Vioxx [rofecoxib]   History: Past Medical History:  Diagnosis Date   Allergy, unspecified not elsewhere classified    Anxiety    Arthritis    Cervical dysplasia    prior to hysterectomy   GERD (gastroesophageal reflux disease)    Headache(784.0)    Hypertension    IBS (irritable bowel syndrome)    Osteopenia 12/2015   T score -1.1 FRAX 3.7%/0.4%   Pelvic adhesions    Pelvic pain in female    Sinus infection    Thyroid disease    Parathyroid cyst   Past Surgical History:  Procedure Laterality Date   ABDOMINAL HYSTERECTOMY  1983   TAH/BSO for pelvic pain and adhesions   APPENDECTOMY  1994   BREAST SURGERY     Biopsy-Benign   CATARACT EXTRACTION Bilateral 2013   COLPOSCOPY     FOOT SURGERY Bilateral    PARATHYROID ADENOMA REMOVED  02/1980   ROTATOR CUFF REPAIR     X2   SHOULDER INJECTION  10/2011   TONSILLECTOMY     TUBAL LIGATION  1981   VESICOVAGINAL FISTULA CLOSURE W/ TAH     Family History  Problem Relation Age of Onset   Diabetes Mother    Lung cancer Father    Leukemia Brother    Hypertension Maternal Grandmother    Heart disease Maternal Grandmother    Diabetes Maternal Grandmother    Stroke Maternal  Grandmother    Diabetes Maternal Aunt    Heart failure Paternal Grandmother    Social History   Socioeconomic History   Marital status: Widowed    Spouse name: Laddie Aquas   Number of children: 1   Years of education: Not on file   Highest education level: Not on file  Occupational History   Occupation: retired    Fish farm manager: RETIRED  Tobacco Use   Smoking status: Never   Smokeless tobacco: Never  Vaping Use   Vaping Use: Never used  Substance and Sexual Activity   Alcohol use: Yes    Comment: Rare   Drug use: No   Sexual activity: Yes    Birth control/protection: Surgical    Comment: 1st intercourse  80 yo-Fewer than 5 partners  Other Topics Concern   Not on file  Social History Narrative   Lives with husband.   Retired from working Surveyor, mining at [employer].   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   Right handed   One story home   Social Determinants of Health   Financial Resource Strain: Low Risk    Difficulty of Paying Living Expenses: Not hard at all  Food Insecurity: No Food Insecurity   Worried About Charity fundraiser in the Last Year: Never true   Ran Out of Food in the Last Year: Never true  Transportation Needs: No Transportation Needs   Lack of Transportation (Medical): No   Lack of Transportation (Non-Medical): No  Physical Activity: Inactive   Days of Exercise per Week: 0 days   Minutes of Exercise per Session: 0 min  Stress: No Stress Concern Present   Feeling of Stress : Not at all  Social Connections: Moderately Isolated   Frequency of Communication with Friends and Family: More than three times a week   Frequency of Social Gatherings with Friends and Family: More than three times a week   Attends Religious Services: More than 4 times per year   Active Member of [employer] or Organizations: No   Attends Archivist Meetings: Never   Marital Status: Widowed    Tobacco Counseling Counseling given: Not Answered   Clinical Intake:  Pre-visit preparation completed: Yes  Pain : No/denies pain     BMI - recorded: 30.21 Nutritional Status: BMI > 30  Obese Nutritional Risks: None Diabetes: No  How often do you need to have someone help you when you read instructions, pamphlets, or other written materials from your doctor or pharmacy?: 1 - Never  Diabetic?No  Interpreter Needed?: No  Information entered by :: Charlott Rakes, LPN   Activities of Daily Living In your present state of health, do you have any difficulty performing the following activities: 09/05/2020  Hearing? N  Vision? N  Difficulty concentrating or making  decisions? N  Walking or climbing stairs? N  Dressing or bathing? N  Doing errands, shopping? N  Preparing Food and eating ? N  Using the Toilet? N  In the past six months, have you accidently leaked urine? N  Do you have problems with loss of bowel control? N  Managing your Medications? N  Managing your Finances? N  Housekeeping or managing your Housekeeping? N  Some recent data might be hidden    Patient Care Team: Eulas Post, MD as PCP - General (Family Medicine)  Indicate any recent Medical Services you may have received from other than [facility] providers in the past year (date may be approximate).  Assessment:   This is a routine wellness examination for [place].  Hearing/Vision screen Hearing Screening - Comments:: Pt denies any hearing issues  Vision Screening - Comments:: Pt follows up with Dr Eulas Post for annual eye exams  Dietary issues and exercise activities discussed: Current Exercise Habits: The patient does not participate in regular exercise at present   Goals Addressed             This Visit's Progress    Patient Stated       Lose weight        Depression Screen PHQ 2/9 Scores 09/05/2020 10/12/2019 09/02/2019 06/06/2017 01/15/2017 01/15/2017 02/07/2016  PHQ - 2 Score 0 1 0 0 1 1 0  PHQ- 9 Score - 6 0 - 1 - -    Fall Risk Fall Risk  09/05/2020 09/02/2019 08/23/2019 08/17/2018 06/06/2017  Falls in the past year? 0 1 0 1 No  Comment - - - Emmi Telephone Survey: data to providers prior to load -  Number falls in past yr: 0 0 0 1 -  Comment - Tripped off the step stool and fell into the floor - Emmi Telephone Survey Actual Response = 1 -  Injury with Fall? 0 0 0 0 -  Risk for fall due to : Impaired balance/gait;Impaired vision Medication side effect - - -  Follow up Falls prevention discussed Falls evaluation completed;Falls prevention discussed - - -    FALL RISK PREVENTION PERTAINING TO THE HOME:  Any stairs in or around the home? Yes  If so, are there  any without handrails? No  Home free of loose throw rugs in walkways, pet beds, electrical cords, etc? Yes  Adequate lighting in your home to reduce risk of falls? Yes   ASSISTIVE DEVICES UTILIZED TO PREVENT FALLS:  Life alert? Yes  Use of a cane, walker or w/c? No  Grab bars in the bathroom? Yes  Shower chair or bench in shower? No  Elevated toilet seat or a handicapped toilet? No   TIMED UP AND GO:  Was the test performed? Yes .  Length of time to ambulate 10 feet: 10 sec.   Gait steady and fast without use of assistive device  Cognitive Function: MMSE - Mini Mental State Exam 06/06/2017 02/07/2016  Not completed: (No Data) (No Data)     6CIT Screen 09/05/2020 09/02/2019  What Year? 0 points 0 points  What month? 0 points 0 points  What time? 0 points 0 points  Count back from 20 0 points 0 points  Months in reverse 0 points 0 points  Repeat phrase 0 points 0 points  Total Score 0 0    Immunizations Immunization History  Administered Date(s) Administered   Fluad Quad(high Dose 65+) 10/01/2018, 10/12/2019   Influenza Split 11/13/2010, 11/12/2011   Influenza Whole 10/27/2007, 11/08/2008, 12/08/2009   Influenza, High Dose Seasonal PF 10/09/2013, 10/28/2014, 09/28/2015, 10/07/2016, 10/17/2017   Influenza,inj,Quad PF,6+ Mos 09/30/2012   PFIZER Comirnaty(Gray Top)Covid-19 Tri-Sucrose Vaccine 05/02/2020   PFIZER(Purple Top)SARS-COV-2 Vaccination 01/26/2019, 02/13/2019, 11/13/2019   Pneumococcal Conjugate-13 05/25/2013   Pneumococcal Polysaccharide-23 05/08/2009   Tdap 10/13/2012   Zoster Recombinat (Shingrix) 09/27/2019, 12/31/2019   Zoster, Live 01/15/2011    TDAP status: Up to date  Flu Vaccine status: Due, Education has been provided regarding the importance of this vaccine. Advised may receive this vaccine at local pharmacy or Health Dept. Aware to provide a copy of the vaccination record if obtained from local pharmacy or Health Dept. Verbalized acceptance and  understanding.  Pneumococcal vaccine status: Up to date  Covid-19 vaccine status: Completed vaccines  Qualifies for Shingles Vaccine? Yes   Zostavax completed Yes   Shingrix Completed?: Yes  Screening Tests Health Maintenance  Topic Date Due   Hepatitis C Screening  Never done   INFLUENZA VACCINE  08/14/2020   TETANUS/TDAP  10/14/2022   DEXA SCAN  Completed   COVID-19 Vaccine  Completed   PNA vac Low Risk Adult  Completed   Zoster Vaccines- Shingrix  Completed   HPV VACCINES  Aged Out    Health Maintenance  Health Maintenance Due  Topic Date Due   Hepatitis C Screening  Never done   INFLUENZA VACCINE  08/14/2020    Colorectal cancer screening: Type of screening: Colonoscopy. Completed 02/02/15. Repeat every 10 years  Mammogram status: Completed 05/09/20. Repeat every year  Bone Density status: Completed 01/21/19. Results reflect: Bone density results: OSTEOPENIA. Repeat every 2 years. Additional Screening:  Hepatitis C Screening: does qualify;  Vision Screening: Recommended annual ophthalmology exams for early detection of glaucoma and other disorders of the eye. Is the patient up to date with their annual eye exam?  Yes  Who is the provider or what is the name of the office in which the patient attends annual eye exams? Dr Eulas Post If pt is not established with a provider, would they like to be referred to a provider to establish care? No .   Dental Screening: Recommended annual dental exams for proper oral hygiene  Community Resource Referral / Chronic Care Management: CRR required this visit?  No   CCM required this visit?  No      Plan:     I have personally reviewed and noted the following in the patient's chart:   Medical and social history Use of alcohol, tobacco or illicit drugs  Current medications and supplements including opioid prescriptions.  Functional ability and status Nutritional status Physical activity Advanced directives List of other  physicians Hospitalizations, surgeries, and ER visits in previous 12 months Vitals Screenings to include cognitive, depression, and falls Referrals and appointments  In addition, I have reviewed and discussed with patient certain preventive protocols, quality metrics, and best practice recommendations. A written personalized care plan for preventive services as well as general preventive health recommendations were provided to patient.     Willette Brace, LPN   [id number]   Nurse Notes: None

## 2020-10-16 ENCOUNTER — Other Ambulatory Visit: Payer: Self-pay

## 2020-10-17 ENCOUNTER — Ambulatory Visit (INDEPENDENT_AMBULATORY_CARE_PROVIDER_SITE_OTHER): Payer: Medicare Other

## 2020-10-17 DIAGNOSIS — Z23 Encounter for immunization: Secondary | ICD-10-CM

## 2020-10-20 DIAGNOSIS — Z1231 Encounter for screening mammogram for malignant neoplasm of breast: Secondary | ICD-10-CM | POA: Diagnosis not present

## 2020-10-20 LAB — HM MAMMOGRAPHY

## 2020-10-23 ENCOUNTER — Encounter: Payer: Self-pay | Admitting: Family Medicine

## 2020-10-23 ENCOUNTER — Encounter: Payer: Self-pay | Admitting: Obstetrics and Gynecology

## 2020-11-08 DIAGNOSIS — H35373 Puckering of macula, bilateral: Secondary | ICD-10-CM | POA: Diagnosis not present

## 2020-11-08 DIAGNOSIS — H26493 Other secondary cataract, bilateral: Secondary | ICD-10-CM | POA: Diagnosis not present

## 2020-11-08 DIAGNOSIS — H33321 Round hole, right eye: Secondary | ICD-10-CM | POA: Diagnosis not present

## 2020-11-08 DIAGNOSIS — H04123 Dry eye syndrome of bilateral lacrimal glands: Secondary | ICD-10-CM | POA: Diagnosis not present

## 2020-11-08 DIAGNOSIS — H35033 Hypertensive retinopathy, bilateral: Secondary | ICD-10-CM | POA: Diagnosis not present

## 2020-11-14 DIAGNOSIS — B351 Tinea unguium: Secondary | ICD-10-CM | POA: Diagnosis not present

## 2020-11-27 ENCOUNTER — Other Ambulatory Visit: Payer: Self-pay

## 2020-11-27 ENCOUNTER — Ambulatory Visit: Payer: Medicare Other | Attending: Internal Medicine

## 2020-11-27 ENCOUNTER — Other Ambulatory Visit (HOSPITAL_BASED_OUTPATIENT_CLINIC_OR_DEPARTMENT_OTHER): Payer: Self-pay

## 2020-11-27 DIAGNOSIS — Z23 Encounter for immunization: Secondary | ICD-10-CM

## 2020-11-27 MED ORDER — PFIZER COVID-19 VAC BIVALENT 30 MCG/0.3ML IM SUSP
INTRAMUSCULAR | 0 refills | Status: DC
Start: 2020-11-27 — End: 2020-12-25
  Filled 2020-11-27: qty 0.3, 1d supply, fill #0

## 2020-11-27 NOTE — Progress Notes (Signed)
Covid-19 Vaccination Clinic  Name:  Margaret Mitchell    MRN: [id number] DOB: 07-Dec-1940  11/27/2020  Ms. Everage was observed post Covid-19 immunization for 15 minutes without incident. She was provided with Vaccine Information Sheet and instruction to access the V-Safe system.   Ms. Berkland was instructed to call 911 with any severe reactions post vaccine: Difficulty breathing  Swelling of face and throat  A fast heartbeat  A bad rash all over body  Dizziness and weakness   Immunizations Administered     Name Date Dose VIS Date Route   Pfizer Covid-19 Vaccine Bivalent Booster 11/27/2020  1:53 PM 0.3 mL 09/13/2020 Intramuscular   Manufacturer: [employer], [employer]   Lot: [id number]   NDC: [phone]

## 2020-12-18 DIAGNOSIS — M542 Cervicalgia: Secondary | ICD-10-CM | POA: Diagnosis not present

## 2020-12-18 DIAGNOSIS — M7581 Other shoulder lesions, right shoulder: Secondary | ICD-10-CM | POA: Diagnosis not present

## 2020-12-21 ENCOUNTER — Ambulatory Visit: Payer: Medicare Other | Admitting: Obstetrics and Gynecology

## 2020-12-21 ENCOUNTER — Encounter: Payer: Medicare Other | Admitting: Obstetrics and Gynecology

## 2020-12-25 ENCOUNTER — Encounter: Payer: Self-pay | Admitting: Obstetrics and Gynecology

## 2020-12-25 ENCOUNTER — Encounter: Payer: Self-pay | Admitting: Cardiovascular Disease

## 2020-12-25 ENCOUNTER — Other Ambulatory Visit: Payer: Self-pay

## 2020-12-25 ENCOUNTER — Ambulatory Visit (INDEPENDENT_AMBULATORY_CARE_PROVIDER_SITE_OTHER): Payer: Medicare Other | Admitting: Cardiovascular Disease

## 2020-12-25 ENCOUNTER — Ambulatory Visit (INDEPENDENT_AMBULATORY_CARE_PROVIDER_SITE_OTHER): Payer: Medicare Other | Admitting: Obstetrics and Gynecology

## 2020-12-25 VITALS — BP 122/80 | HR 78 | Ht 65.5 in | Wt 192.0 lb

## 2020-12-25 VITALS — BP 130/82 | HR 65 | Ht 65.5 in | Wt 196.8 lb

## 2020-12-25 DIAGNOSIS — M8588 Other specified disorders of bone density and structure, other site: Secondary | ICD-10-CM

## 2020-12-25 DIAGNOSIS — Z01419 Encounter for gynecological examination (general) (routine) without abnormal findings: Secondary | ICD-10-CM | POA: Diagnosis not present

## 2020-12-25 DIAGNOSIS — I10 Essential (primary) hypertension: Secondary | ICD-10-CM

## 2020-12-25 DIAGNOSIS — I340 Nonrheumatic mitral (valve) insufficiency: Secondary | ICD-10-CM

## 2020-12-25 DIAGNOSIS — R8290 Unspecified abnormal findings in urine: Secondary | ICD-10-CM

## 2020-12-25 DIAGNOSIS — I5189 Other ill-defined heart diseases: Secondary | ICD-10-CM

## 2020-12-25 DIAGNOSIS — R002 Palpitations: Secondary | ICD-10-CM

## 2020-12-25 NOTE — Patient Instructions (Signed)

## 2020-12-25 NOTE — Addendum Note (Signed)
Addended by: Lowella Fairy on: 12/25/2020 03:33 PM   Modules accepted: Orders

## 2020-12-25 NOTE — Progress Notes (Signed)
80 y.o. G72P0010 Widowed [place] American female here for breast and pelvic exam.    She is followed for osteopenia with bone densities through this office.  Her last BMD was done 01/21/19.  Her T score of the spine was -1.4. Her hips were normal.  Her FRAX model showed risk of hip fracture at 0.7% and her risk of major osteoporotic fracture at 4.4%.   Still having hot flashes.   She took ERT for about 10 yeas following this.   Notes some urinary odor.  No dysuria.   PCP: Carolann Littler, MD   No LMP recorded. Patient has had a hysterectomy.           Sexually active: Yes.    The current method of family planning is status post hysterectomy/BSO.    Exercising: No.   Some walking within her home Smoker:  no  Health Maintenance: Pap:  2011 normal History of abnormal Pap:  yes, Hx of dysplasia prior to 1983 MMG:  10-20-20 Neg/Birads1 Colonoscopy:  02-02-15 normal;10 years BMD:  01-21-19  Result : Osteopenia TDaP:  10-13-12 Gardasil:   no HIV:no Hep C: Neg 10 years ago Screening Labs:  PCP Flu and bivalent Covid vaccine:  completed.    reports that she has never smoked. She has never used smokeless tobacco. She reports current alcohol use of about 1.0 standard drink per week. She reports that she does not use drugs.  Past Medical History:  Diagnosis Date   Allergy, unspecified not elsewhere classified    Anxiety    Arthritis    Cervical dysplasia    prior to hysterectomy   GERD (gastroesophageal reflux disease)    Headache(784.0)    Hypertension    IBS (irritable bowel syndrome)    Osteopenia 12/2015   T score -1.1 FRAX 3.7%/0.4%   Pelvic adhesions    Pelvic pain in female    Sinus infection    Thyroid disease    Parathyroid cyst    Past Surgical History:  Procedure Laterality Date   ABDOMINAL HYSTERECTOMY  1983   TAH/BSO for pelvic pain and adhesions   APPENDECTOMY  1994   BREAST SURGERY     Biopsy-Benign   CATARACT EXTRACTION Bilateral 2013   COLPOSCOPY      FOOT SURGERY Bilateral    PARATHYROID ADENOMA REMOVED  02/1980   ROTATOR CUFF REPAIR     X2   SHOULDER INJECTION  10/2011   TONSILLECTOMY     TUBAL LIGATION  1981   VESICOVAGINAL FISTULA CLOSURE W/ TAH      Current Outpatient Medications  Medication Sig Dispense Refill   amLODipine-benazepril (LOTREL) 5-20 MG capsule TAKE 1 CAPSULE DAILY 90 capsule 3   aspirin 81 MG tablet Take 81 mg by mouth daily.     celecoxib (CELEBREX) 100 MG capsule Take 100 mg by mouth daily.     clotrimazole-betamethasone (LOTRISONE) cream Apply topically daily.     COVID-19 mRNA bivalent vaccine, Pfizer, (PFIZER COVID-19 VAC BIVALENT) injection Inject into the muscle. 0.3 mL 0   metoprolol succinate (TOPROL-XL) 50 MG 24 hr tablet TAKE 1 TABLET DAILY WITH OR IMMEDIATELY FOLLOWING A MEAL 90 tablet 3   Multiple Vitamin (MULTIVITAMIN) tablet Take 1 tablet by mouth daily.     No current facility-administered medications for this visit.    Family History  Problem Relation Age of Onset   Diabetes Mother    Lung cancer Father    Leukemia Brother    Hypertension Maternal Grandmother  Heart disease Maternal Grandmother    Diabetes Maternal Grandmother    Stroke Maternal Grandmother    Diabetes Maternal Aunt    Heart failure Paternal Grandmother     Review of Systems  All other systems reviewed and are negative.  Exam:   BP 122/80   Pulse 78   Ht 5' 5.5" (1.664 m)   Wt 192 lb (87.1 kg)   SpO2 99%   BMI 31.46 kg/m     General appearance: alert, cooperative and appears stated age Head: normocephalic, without obvious abnormality, atraumatic Neck: no adenopathy, supple, symmetrical, trachea midline and thyroid normal to inspection and palpation Lungs: clear to auscultation bilaterally Breasts: normal appearance, no masses or tenderness, No nipple retraction or dimpling, No nipple discharge or bleeding, No axillary adenopathy Heart: regular rate and rhythm Abdomen: soft, non-tender; no masses, no  organomegaly Extremities: extremities normal, atraumatic, no cyanosis or edema Skin: skin color, texture, turgor normal. No rashes or lesions Lymph nodes: cervical, supraclavicular, and axillary nodes normal. Neurologic: grossly normal  Pelvic: External genitalia:  no lesions              No abnormal inguinal nodes palpated.              Urethra:  normal appearing urethra with no masses, tenderness or lesions              Bartholins and Skenes: normal                 Vagina: normal appearing vagina with normal color and discharge, no lesions              Cervix: absent              Pap taken: no Bimanual Exam:  Uterus:  absent              Adnexa: no mass, fullness, tenderness              Rectal exam: yes.  Confirms.              Anus:  normal sphincter tone, no lesions  Chaperone was present for exam:  Estill Bamberg, CMA  Assessment:   Well woman visit with gynecologic exam. Status post TAH/BSO.  Remote history of abnormal pap prior to hysterectomy.  Status post vesicovaginal fistula closure with TAH.  Osteopenia.  Status post parathryoid adenoma removal.  Urine odor.   Plan: Mammogram screening discussed. Self breast awareness reviewed. Pap and HR HPV as above. Guidelines for Calcium, Vitamin D, regular exercise program including cardiovascular and weight bearing exercise. Prior BMD reviewed from 2021.  BMD in January 2023 at this office.  Labs with PCP.  Will check urinalysis and do reflex culture.  Next breast and pelvic exam in 2 years.  After visit summary provided.   20 min  total time was spent for this patient encounter, including preparation, face-to-face counseling with the patient, coordination of care, and documentation of the encounter.

## 2020-12-25 NOTE — Patient Instructions (Addendum)
Medication Instructions:  No changes   *If you need a refill on your cardiac medications before your next appointment, please call your pharmacy*   Lab Work: Not needed   Testing/Procedures: Not needed   Follow-Up: At [facility], you and your health needs are our priority.  As part of our continuing mission to provide you with exceptional heart care, we have created designated Provider Care Teams.  These Care Teams include your primary Cardiologist (physician) and Advanced Practice Providers (APPs -  Physician Assistants and Nurse Practitioners) who all work together to provide you with the care you need, when you need it.     Your next appointment:   12 month(s)  The format for your next appointment:   In Person  Provider:   Thomas Kelly, MD     

## 2020-12-25 NOTE — Progress Notes (Signed)
Cardiology Office Note    Date:  12/26/2020   ID:  Margaret Mitchell May 07, 1940, MRN [id number]  PCP:  Eulas Post, MD  Cardiologist:  Shelva Majestic, MD   1 year follow-up  cardiology evaluation initally referred through the courtesy of Dr. Carolann Littler for evaluation of palpitations.  History of Present Illness:  Margaret Mitchell is a 79 y.o. female who is followed by Dr. Carolann Littler.  I saw her for initial evaluation on March 18, 2019 and last saw her December 06, 2019. She presents for a one year  follow-up cardiology evaluation.     Margaret Mitchell has a longstanding history of hypertension and over the years has been treated with Lotrel 5/20.  In 2007, metoprolol was added to her medical regimen.  Recently, she had been maintained on 100 mg of metoprolol succinate in addition to her Lotrel 5/20.  On December 21, 2018, her husband unfortunately passed away.  She admits to being under some increased stress since that time.  Over the past several months, she has noticed isolated episodes of palpitations which seem to occur after she sits down and is at rest and notes occasional isolated skips.  She is unaware of any sustained episodes of increasing heart rate or palpitations.  She had been seen by Dr. Elease Hashimoto and was started on sertraline following her husband's death to help with some depression and anxiety.  She admits to using caffeine 1 cup/day of coffee.  She does not eat chocolates.  She was recently reevaluated by Dr. Elease Hashimoto and her dose of metoprolol succinate was reduced from 100 mg down to 50 mg.  She is unaware of any major benefit with this dose reduction.  However she denies any chest pain PND orthopnea presyncope or syncope.  Remotely she states that she had worn a monitor and nothing was picked up.  Review of her records reveals that in 2017 she had a 2D echo Doppler study which showed an EF of 60 to 65%.  There was mild focal basal hypertrophy of the septum.  She  had normal diastolic parameters.  There was minimal calcification to her aortic valve leaflets.  In February 2018 she underwent a [place] study which reviewed revealed normal perfusion without scar or ischemia and nuclear stress EF at 60%.  Thyroid function studies in June 2020 were normal.  When I initially saw her I recommended that she undergo a 4-year follow-up echo Doppler study and recommended a complete set of laboratory.  Her echo Doppler study on April 20, 2019 showed an EF of 60 to 65% and grade 2 diastolic dysfunction.  There was mild mitral regurgitation.  She was evaluated by Carolann Littler her primary provider in September 2021.  She apparently had developed some sinus drainage with coughing and chest congestion and was instructed to go to the ER on November 1.  During that evaluation her potassium was 3.4 and calcium 8.7.  I last saw her on December 06, 2019 at which time she felt well and denied any chest pain or palpitations.  She continued to be on  amlodipine/benazepril 5/20 mg daily as well as metoprolol succinate 50 mg for blood pressure control.  She had her Covid booster vaccine in October.    Since I last saw her, she has undergone evaluation by Dr. Carolann Littler and had body cramps and constipation.  Over the past year, she has lost approximately 9 pounds.  Presently she feels well.  She denies  Cardiology Office Note    Date:  12/26/2020   ID:  Margaret Mitchell May 07, 1940, MRN [id number]  PCP:  Eulas Post, MD  Cardiologist:  Shelva Majestic, MD   1 year follow-up  cardiology evaluation initally referred through the courtesy of Dr. Carolann Littler for evaluation of palpitations.  History of Present Illness:  Margaret Mitchell is a 79 y.o. female who is followed by Dr. Carolann Littler.  I saw her for initial evaluation on March 18, 2019 and last saw her December 06, 2019. She presents for a one year  follow-up cardiology evaluation.     Margaret Mitchell has a longstanding history of hypertension and over the years has been treated with Lotrel 5/20.  In 2007, metoprolol was added to her medical regimen.  Recently, she had been maintained on 100 mg of metoprolol succinate in addition to her Lotrel 5/20.  On December 21, 2018, her husband unfortunately passed away.  She admits to being under some increased stress since that time.  Over the past several months, she has noticed isolated episodes of palpitations which seem to occur after she sits down and is at rest and notes occasional isolated skips.  She is unaware of any sustained episodes of increasing heart rate or palpitations.  She had been seen by Dr. Elease Hashimoto and was started on sertraline following her husband's death to help with some depression and anxiety.  She admits to using caffeine 1 cup/day of coffee.  She does not eat chocolates.  She was recently reevaluated by Dr. Elease Hashimoto and her dose of metoprolol succinate was reduced from 100 mg down to 50 mg.  She is unaware of any major benefit with this dose reduction.  However she denies any chest pain PND orthopnea presyncope or syncope.  Remotely she states that she had worn a monitor and nothing was picked up.  Review of her records reveals that in 2017 she had a 2D echo Doppler study which showed an EF of 60 to 65%.  There was mild focal basal hypertrophy of the septum.  She  had normal diastolic parameters.  There was minimal calcification to her aortic valve leaflets.  In February 2018 she underwent a [place] study which reviewed revealed normal perfusion without scar or ischemia and nuclear stress EF at 60%.  Thyroid function studies in June 2020 were normal.  When I initially saw her I recommended that she undergo a 4-year follow-up echo Doppler study and recommended a complete set of laboratory.  Her echo Doppler study on April 20, 2019 showed an EF of 60 to 65% and grade 2 diastolic dysfunction.  There was mild mitral regurgitation.  She was evaluated by Carolann Littler her primary provider in September 2021.  She apparently had developed some sinus drainage with coughing and chest congestion and was instructed to go to the ER on November 1.  During that evaluation her potassium was 3.4 and calcium 8.7.  I last saw her on December 06, 2019 at which time she felt well and denied any chest pain or palpitations.  She continued to be on  amlodipine/benazepril 5/20 mg daily as well as metoprolol succinate 50 mg for blood pressure control.  She had her Covid booster vaccine in October.    Since I last saw her, she has undergone evaluation by Dr. Carolann Littler and had body cramps and constipation.  Over the past year, she has lost approximately 9 pounds.  Presently she feels well.  She denies  Cardiology Office Note    Date:  12/26/2020   ID:  Margaret Mitchell May 07, 1940, MRN [id number]  PCP:  Eulas Post, MD  Cardiologist:  Shelva Majestic, MD   1 year follow-up  cardiology evaluation initally referred through the courtesy of Dr. Carolann Littler for evaluation of palpitations.  History of Present Illness:  Margaret Mitchell is a 79 y.o. female who is followed by Dr. Carolann Littler.  I saw her for initial evaluation on March 18, 2019 and last saw her December 06, 2019. She presents for a one year  follow-up cardiology evaluation.     Margaret Mitchell has a longstanding history of hypertension and over the years has been treated with Lotrel 5/20.  In 2007, metoprolol was added to her medical regimen.  Recently, she had been maintained on 100 mg of metoprolol succinate in addition to her Lotrel 5/20.  On December 21, 2018, her husband unfortunately passed away.  She admits to being under some increased stress since that time.  Over the past several months, she has noticed isolated episodes of palpitations which seem to occur after she sits down and is at rest and notes occasional isolated skips.  She is unaware of any sustained episodes of increasing heart rate or palpitations.  She had been seen by Dr. Elease Hashimoto and was started on sertraline following her husband's death to help with some depression and anxiety.  She admits to using caffeine 1 cup/day of coffee.  She does not eat chocolates.  She was recently reevaluated by Dr. Elease Hashimoto and her dose of metoprolol succinate was reduced from 100 mg down to 50 mg.  She is unaware of any major benefit with this dose reduction.  However she denies any chest pain PND orthopnea presyncope or syncope.  Remotely she states that she had worn a monitor and nothing was picked up.  Review of her records reveals that in 2017 she had a 2D echo Doppler study which showed an EF of 60 to 65%.  There was mild focal basal hypertrophy of the septum.  She  had normal diastolic parameters.  There was minimal calcification to her aortic valve leaflets.  In February 2018 she underwent a [place] study which reviewed revealed normal perfusion without scar or ischemia and nuclear stress EF at 60%.  Thyroid function studies in June 2020 were normal.  When I initially saw her I recommended that she undergo a 4-year follow-up echo Doppler study and recommended a complete set of laboratory.  Her echo Doppler study on April 20, 2019 showed an EF of 60 to 65% and grade 2 diastolic dysfunction.  There was mild mitral regurgitation.  She was evaluated by Carolann Littler her primary provider in September 2021.  She apparently had developed some sinus drainage with coughing and chest congestion and was instructed to go to the ER on November 1.  During that evaluation her potassium was 3.4 and calcium 8.7.  I last saw her on December 06, 2019 at which time she felt well and denied any chest pain or palpitations.  She continued to be on  amlodipine/benazepril 5/20 mg daily as well as metoprolol succinate 50 mg for blood pressure control.  She had her Covid booster vaccine in October.    Since I last saw her, she has undergone evaluation by Dr. Carolann Littler and had body cramps and constipation.  Over the past year, she has lost approximately 9 pounds.  Presently she feels well.  She denies   Cardiology Office Note    Date:  12/26/2020   ID:  Jenniferlynn W Witt, DOB 08/21/1940, MRN [id number]  PCP:  Burchette, Bruce W, MD  Cardiologist:   , MD   1 year follow-up  cardiology evaluation initally referred through the courtesy of Dr. Bruce Burchette for evaluation of palpitations.  History of Present Illness:  Lannie W Bocanegra is a 80 y.o. female who is followed by Dr. Bruce Burchette.  I saw her for initial evaluation on March 18, 2019 and last saw her December 06, 2019. She presents for a one year  follow-up cardiology evaluation.     Ms. Stogdill has a longstanding history of hypertension and over the years has been treated with Lotrel 5/20.  In 2007, metoprolol was added to her medical regimen.  Recently, she had been maintained on 100 mg of metoprolol succinate in addition to her Lotrel 5/20.  On December 21, 2018, her husband unfortunately passed away.  She admits to being under some increased stress since that time.  Over the past several months, she has noticed isolated episodes of palpitations which seem to occur after she sits down and is at rest and notes occasional isolated skips.  She is unaware of any sustained episodes of increasing heart rate or palpitations.  She had been seen by Dr. Burchette and was started on sertraline following her husband's death to help with some depression and anxiety.  She admits to using caffeine 1 cup/day of coffee.  She does not eat chocolates.  She was recently reevaluated by Dr. Burchette and her dose of metoprolol succinate was reduced from 100 mg down to 50 mg.  She is unaware of any major benefit with this dose reduction.  However she denies any chest pain PND orthopnea presyncope or syncope.  Remotely she states that she had worn a monitor and nothing was picked up.  Review of her records reveals that in 2017 she had a 2D echo Doppler study which showed an EF of 60 to 65%.  There was mild focal basal hypertrophy of the septum.  She  had normal diastolic parameters.  There was minimal calcification to her aortic valve leaflets.  In February 2018 she underwent a Lexiscan Myoview study which reviewed revealed normal perfusion without scar or ischemia and nuclear stress EF at 60%.  Thyroid function studies in June 2020 were normal.  When I initially saw her I recommended that she undergo a 4-year follow-up echo Doppler study and recommended a complete set of laboratory.  Her echo Doppler study on April 20, 2019 showed an EF of 60 to 65% and grade 2 diastolic dysfunction.  There was mild mitral regurgitation.  She was evaluated by Bruce Burchette her primary provider in September 2021.  She apparently had developed some sinus drainage with coughing and chest congestion and was instructed to go to the ER on November 1.  During that evaluation her potassium was 3.4 and calcium 8.7.  I last saw her on December 06, 2019 at which time she felt well and denied any chest pain or palpitations.  She continued to be on  amlodipine/benazepril 5/20 mg daily as well as metoprolol succinate 50 mg for blood pressure control.  She had her Covid booster vaccine in October.    Since I last saw her, she has undergone evaluation by Dr. Bruce Burchette and had body cramps and constipation.  Over the past year, she has lost approximately 9 pounds.  Presently she feels well.  She denies    Cardiology Office Note    Date:  12/26/2020   ID:  Jenniferlynn W Witt, DOB 08/21/1940, MRN [id number]  PCP:  Burchette, Bruce W, MD  Cardiologist:   , MD   1 year follow-up  cardiology evaluation initally referred through the courtesy of Dr. Bruce Burchette for evaluation of palpitations.  History of Present Illness:  Lannie W Bocanegra is a 80 y.o. female who is followed by Dr. Bruce Burchette.  I saw her for initial evaluation on March 18, 2019 and last saw her December 06, 2019. She presents for a one year  follow-up cardiology evaluation.     Ms. Stogdill has a longstanding history of hypertension and over the years has been treated with Lotrel 5/20.  In 2007, metoprolol was added to her medical regimen.  Recently, she had been maintained on 100 mg of metoprolol succinate in addition to her Lotrel 5/20.  On December 21, 2018, her husband unfortunately passed away.  She admits to being under some increased stress since that time.  Over the past several months, she has noticed isolated episodes of palpitations which seem to occur after she sits down and is at rest and notes occasional isolated skips.  She is unaware of any sustained episodes of increasing heart rate or palpitations.  She had been seen by Dr. Burchette and was started on sertraline following her husband's death to help with some depression and anxiety.  She admits to using caffeine 1 cup/day of coffee.  She does not eat chocolates.  She was recently reevaluated by Dr. Burchette and her dose of metoprolol succinate was reduced from 100 mg down to 50 mg.  She is unaware of any major benefit with this dose reduction.  However she denies any chest pain PND orthopnea presyncope or syncope.  Remotely she states that she had worn a monitor and nothing was picked up.  Review of her records reveals that in 2017 she had a 2D echo Doppler study which showed an EF of 60 to 65%.  There was mild focal basal hypertrophy of the septum.  She  had normal diastolic parameters.  There was minimal calcification to her aortic valve leaflets.  In February 2018 she underwent a Lexiscan Myoview study which reviewed revealed normal perfusion without scar or ischemia and nuclear stress EF at 60%.  Thyroid function studies in June 2020 were normal.  When I initially saw her I recommended that she undergo a 4-year follow-up echo Doppler study and recommended a complete set of laboratory.  Her echo Doppler study on April 20, 2019 showed an EF of 60 to 65% and grade 2 diastolic dysfunction.  There was mild mitral regurgitation.  She was evaluated by Bruce Burchette her primary provider in September 2021.  She apparently had developed some sinus drainage with coughing and chest congestion and was instructed to go to the ER on November 1.  During that evaluation her potassium was 3.4 and calcium 8.7.  I last saw her on December 06, 2019 at which time she felt well and denied any chest pain or palpitations.  She continued to be on  amlodipine/benazepril 5/20 mg daily as well as metoprolol succinate 50 mg for blood pressure control.  She had her Covid booster vaccine in October.    Since I last saw her, she has undergone evaluation by Dr. Bruce Burchette and had body cramps and constipation.  Over the past year, she has lost approximately 9 pounds.  Presently she feels well.  She denies

## 2020-12-26 ENCOUNTER — Encounter: Payer: Self-pay | Admitting: Cardiovascular Disease

## 2020-12-26 ENCOUNTER — Telehealth: Payer: Self-pay

## 2020-12-26 LAB — URINE CULTURE
MICRO NUMBER:: 12744678
SPECIMEN QUALITY:: ADEQUATE

## 2020-12-26 NOTE — Telephone Encounter (Signed)
Refill sent to pharmacy on 12/18/20 by Historical provider.    Please advise

## 2020-12-26 NOTE — Telephone Encounter (Signed)
Patient called asking if she could wean herself off Rx patient stated Rx is not agreeing with her she is unable to reach the doctor who prescribed it   celecoxib (CELEBREX) 100 MG capsule

## 2020-12-26 NOTE — Telephone Encounter (Signed)
Called spoke with patient about message. 

## 2020-12-27 ENCOUNTER — Other Ambulatory Visit: Payer: Self-pay | Admitting: *Deleted

## 2020-12-27 DIAGNOSIS — M8588 Other specified disorders of bone density and structure, other site: Secondary | ICD-10-CM

## 2021-01-10 ENCOUNTER — Encounter: Payer: Self-pay | Admitting: Family Medicine

## 2021-01-10 ENCOUNTER — Ambulatory Visit (INDEPENDENT_AMBULATORY_CARE_PROVIDER_SITE_OTHER): Payer: Medicare Other | Admitting: Family Medicine

## 2021-01-10 VITALS — BP 120/82 | HR 79 | Temp 97.3°F | Ht 65.5 in | Wt 193.0 lb

## 2021-01-10 DIAGNOSIS — I10 Essential (primary) hypertension: Secondary | ICD-10-CM | POA: Diagnosis not present

## 2021-01-10 DIAGNOSIS — M8588 Other specified disorders of bone density and structure, other site: Secondary | ICD-10-CM

## 2021-01-10 DIAGNOSIS — K219 Gastro-esophageal reflux disease without esophagitis: Secondary | ICD-10-CM | POA: Diagnosis not present

## 2021-01-10 NOTE — Patient Instructions (Signed)
Consider trial of OTC Nexium or Prilosec 20 mg one to two daily  Let me know if symptoms not improving  in next few days.

## 2021-01-10 NOTE — Progress Notes (Signed)
Established Patient Office Visit  Subjective:  Patient ID: Margaret Mitchell, female    DOB: 1940-02-25  Age: 80 y.o. MRN: [id number]  CC:  Chief Complaint  Patient presents with   Gastroesophageal Reflux    Started after taking Celebrex for shoulder pain this month    Results    Would like to go over some information that GYN and headache clinic gave her     HPI [name] presents for the following issues  Recent increased GERD symptoms.  She had some shoulder pain and started Celebrex.  She started having some substernal burning after starting Celebrex.  Symptoms have been daily for the past couple weeks.  No hematemesis.  No melena.  She has remote history of ulcer.  She generally has good control of reflux with Pepcid but recently with taking Pepcid even twice daily has had some breakthrough symptoms.  Minimal caffeine use.  No alcohol use.  She wanted to discuss DEXA scan from last year.  She had DEXA scan 2021 which showed osteopenia with T score -1.4 lumbar spine.  She actually had very good scores in her hip generally.  T score -1.0 right hip.  Very high Z scores.  No history of fracture.  She does take calcium and vitamin D.  Her GYN suggested that she have repeat DEXA scan this year.  She has hypertension treated with Lotrel and Toprol-XL.  Blood pressures been well controlled.  No recent orthostatic symptoms.  No recent chest pains.  Past Medical History:  Diagnosis Date   Allergy, unspecified not elsewhere classified    Anxiety    Arthritis    Cervical dysplasia    prior to hysterectomy   GERD (gastroesophageal reflux disease)    Headache(784.0)    Hypertension    IBS (irritable bowel syndrome)    Osteopenia 12/2015   T score -1.1 FRAX 3.7%/0.4%   Pelvic adhesions    Pelvic pain in female    Sinus infection    Thyroid disease    Parathyroid cyst    Past Surgical History:  Procedure Laterality Date   ABDOMINAL HYSTERECTOMY  1983   TAH/BSO for pelvic  pain and adhesions   APPENDECTOMY  1994   BREAST SURGERY     Biopsy-Benign   CATARACT EXTRACTION Bilateral 2013   COLPOSCOPY     FOOT SURGERY Bilateral    PARATHYROID ADENOMA REMOVED  02/1980   ROTATOR CUFF REPAIR     X2   SHOULDER INJECTION  10/2011   TONSILLECTOMY     TUBAL LIGATION  1981   VESICOVAGINAL FISTULA CLOSURE W/ TAH      Family History  Problem Relation Age of Onset   Diabetes Mother    Lung cancer Father    Leukemia Brother    Hypertension Maternal Grandmother    Heart disease Maternal Grandmother    Diabetes Maternal Grandmother    Stroke Maternal Grandmother    Diabetes Maternal Aunt    Heart failure Paternal Grandmother     Social History   Socioeconomic History   Marital status: Widowed    Spouse name: Charlann Noss   Number of children: 1   Years of education: Not on file   Highest education level: Bachelor's degree (e.g., BA, AB, BS)  Occupational History   Occupation: retired    Associate Professor: RETIRED  Tobacco Use   Smoking status: Never   Smokeless tobacco: Never  Vaping Use   Vaping Use: Never used  Substance and Sexual Activity  Alcohol use: Yes    Alcohol/week: 1.0 standard drink    Types: 1 Glasses of wine per week    Comment: Rare   Drug use: No   Sexual activity: Yes    Birth control/protection: Surgical    Comment: 1st intercourse 80 yo-Fewer than 5 partners  Other Topics Concern   Not on file  Social History Narrative   Lives with husband.   Retired from working Barrister's clerk at [employer]   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   Right handed   One story home   Social Determinants of Health   Financial Resource Strain: Low Risk    Difficulty of Paying Living Expenses: Not hard at all  Food Insecurity: No Food Insecurity   Worried About Programme researcher, broadcasting/film/video in the Last Year: Never true   Ran Out of Food in the Last Year: Never true  Transportation Needs: No Transportation Needs   Lack of Transportation (Medical): No    Lack of Transportation (Non-Medical): No  Physical Activity: Insufficiently Active   Days of Exercise per Week: 3 days   Minutes of Exercise per Session: 30 min  Stress: No Stress Concern Present   Feeling of Stress : Not at all  Social Connections: Moderately Integrated   Frequency of Communication with Friends and Family: More than three times a week   Frequency of Social Gatherings with Friends and Family: Twice a week   Attends Religious Services: More than 4 times per year   Active Member of [employer] or Organizations: Yes   Attends Banker Meetings: More than 4 times per year   Marital Status: Widowed  Catering manager Violence: Not At Risk   Fear of Current or Ex-Partner: No   Emotionally Abused: No   Physically Abused: No   Sexually Abused: No    Outpatient Medications Prior to Visit  Medication Sig Dispense Refill   amLODipine-benazepril (LOTREL) 5-20 MG capsule TAKE 1 CAPSULE DAILY 90 capsule 3   aspirin 81 MG tablet Take 81 mg by mouth daily.     Calcium Carbonate-Vit D-Min (CALCIUM 1200 PO) Take by mouth.     clotrimazole-betamethasone (LOTRISONE) cream Apply topically daily.     Magnesium 400 MG CAPS Take by mouth.     metoprolol succinate (TOPROL-XL) 50 MG 24 hr tablet TAKE 1 TABLET DAILY WITH OR IMMEDIATELY FOLLOWING A MEAL 90 tablet 3   Multiple Vitamin (MULTIVITAMIN) tablet Take 1 tablet by mouth daily.     celecoxib (CELEBREX) 100 MG capsule Take 100 mg by mouth daily.     No facility-administered medications prior to visit.    Allergies  Allergen Reactions   Other     Seldane   Prednisone     REACTION: heart palpatations  In larger doses   Seldane [Terfenadine]     SELDANE   Sulfa Antibiotics Other (See Comments)    REACTION: pt unable to remember REACTION: pt unable to remember   Sulfasalazine Other (See Comments)    REACTION: pt unable to remember   Sulfonamide Derivatives     REACTION: pt unable to remember   Vioxx [Rofecoxib]      REACTION: mouth swelling (VIOXX)    ROS Review of Systems  Constitutional:  Negative for fatigue.  Eyes:  Negative for visual disturbance.  Respiratory:  Negative for cough, chest tightness, shortness of breath and wheezing.   Cardiovascular:  Negative for palpitations and leg swelling.  Gastrointestinal:  Negative for abdominal pain.  Neurological:  Negative for dizziness, seizures, syncope, weakness, light-headedness and headaches.     Objective:    Physical Exam Vitals reviewed.  Constitutional:      Appearance: Normal appearance.  Cardiovascular:     Rate and Rhythm: Normal rate and regular rhythm.  Pulmonary:     Effort: Pulmonary effort is normal.     Breath sounds: Normal breath sounds. No wheezing or rales.  Musculoskeletal:     Right lower leg: No edema.     Left lower leg: No edema.  Neurological:     Mental Status: She is alert.    BP 120/82    Pulse 79    Temp (!) 97.3 F (36.3 C) (Oral)    Ht 5' 5.5" (1.664 m)    Wt 193 lb (87.5 kg)    SpO2 100%    BMI 31.63 kg/m  Wt Readings from Last 3 Encounters:  01/10/21 193 lb (87.5 kg)  12/25/20 196 lb 12.8 oz (89.3 kg)  12/25/20 192 lb (87.1 kg)     Health Maintenance Due  Topic Date Due   Hepatitis C Screening  Never done    There are no preventive care reminders to display for this patient.  Lab Results  Component Value Date   TSH 2.820 04/08/2019   Lab Results  Component Value Date   WBC 5.2 11/15/2019   HGB 13.4 11/15/2019   HCT 40.7 11/15/2019   MCV 95.5 11/15/2019   PLT 254 11/15/2019   Lab Results  Component Value Date   NA 141 01/11/2020   K 4.4 01/11/2020   CO2 29 01/11/2020   GLUCOSE 76 01/11/2020   BUN 14 01/11/2020   CREATININE 0.97 01/11/2020   BILITOT 0.3 01/11/2020   ALKPHOS 89 01/11/2020   AST 21 01/11/2020   ALT 16 01/11/2020   PROT 7.3 01/11/2020   ALBUMIN 4.2 01/11/2020   CALCIUM 9.4 01/11/2020   ANIONGAP 8 11/15/2019   GFR 56.31 (L) 01/11/2020   Lab Results   Component Value Date   CHOL 178 04/08/2019   Lab Results  Component Value Date   HDL 72 04/08/2019   Lab Results  Component Value Date   LDLCALC 83 04/08/2019   Lab Results  Component Value Date   TRIG 137 04/08/2019   Lab Results  Component Value Date   CHOLHDL 2.5 04/08/2019   Lab Results  Component Value Date   HGBA1C 6.1 05/03/2015      Assessment & Plan:   #1 GERD.  Possibly exacerbated by recent use of Celebrex.  -Leave off nonsteroidals as much as possible -We discussed stepping up to Nexium or Prilosec 20 mg once or twice daily -May supplement with Pepcid as needed -Discussed dietary triggers for GERD with handout given -Be in touch if symptoms not controlled with the above  #2 osteopenia by DEXA scan a year ago. -Discussed benefit of regular weightbearing exercise such as walking along with daily calcium and vitamin D -She will consider repeat DEXA scan either this year or next year  #3 hypertension well-controlled with combination therapy of Lotrel and metoprolol extended release.  Continue current medications   No orders of the defined types were placed in this encounter.   Follow-up: No follow-ups on file.    Evelena Peat, MD

## 2021-02-05 ENCOUNTER — Other Ambulatory Visit: Payer: Self-pay | Admitting: Family Medicine

## 2021-02-06 ENCOUNTER — Encounter: Payer: Self-pay | Admitting: Family Medicine

## 2021-02-06 DIAGNOSIS — I10 Essential (primary) hypertension: Secondary | ICD-10-CM

## 2021-02-07 ENCOUNTER — Other Ambulatory Visit (INDEPENDENT_AMBULATORY_CARE_PROVIDER_SITE_OTHER): Payer: Medicare Other

## 2021-02-07 DIAGNOSIS — I10 Essential (primary) hypertension: Secondary | ICD-10-CM

## 2021-02-07 LAB — BASIC METABOLIC PANEL
BUN: 14 mg/dL (ref 6–23)
CO2: 30 mEq/L (ref 19–32)
Calcium: 9.3 mg/dL (ref 8.4–10.5)
Chloride: 103 mEq/L (ref 96–112)
Creatinine, Ser: 0.91 mg/dL (ref 0.40–1.20)
GFR: 60.33 mL/min (ref >60.00)
Glucose, Bld: 82 mg/dL (ref 70–99)
Potassium: 3.7 mEq/L (ref 3.5–5.1)
Sodium: 140 mEq/L (ref 135–145)

## 2021-03-06 ENCOUNTER — Other Ambulatory Visit: Payer: Self-pay | Admitting: Obstetrics and Gynecology

## 2021-03-06 ENCOUNTER — Other Ambulatory Visit: Payer: Self-pay

## 2021-03-06 ENCOUNTER — Telehealth: Payer: Self-pay | Admitting: Family Medicine

## 2021-03-06 ENCOUNTER — Ambulatory Visit (INDEPENDENT_AMBULATORY_CARE_PROVIDER_SITE_OTHER): Payer: Medicare Other

## 2021-03-06 DIAGNOSIS — Z780 Asymptomatic menopausal state: Secondary | ICD-10-CM

## 2021-03-06 DIAGNOSIS — M8588 Other specified disorders of bone density and structure, other site: Secondary | ICD-10-CM

## 2021-03-06 NOTE — Telephone Encounter (Signed)
Patient stopped by to drop off forms to be filled out by Dr.Burchette. Paperwork placed in file to be completed.     Please advise

## 2021-03-07 NOTE — Telephone Encounter (Signed)
Form for DMV was completed by the provider and I called and LVM for the patient informing her that it was ready and available for pickup.  Brynlei Klausner,cma

## 2021-03-12 ENCOUNTER — Emergency Department (HOSPITAL_BASED_OUTPATIENT_CLINIC_OR_DEPARTMENT_OTHER)
Admission: EM | Admit: 2021-03-12 | Discharge: 2021-03-12 | Disposition: A | Discharge status: Home / Self Care | Payer: Medicare Other | Attending: Student | Admitting: Student

## 2021-03-12 ENCOUNTER — Encounter (HOSPITAL_BASED_OUTPATIENT_CLINIC_OR_DEPARTMENT_OTHER): Payer: Self-pay | Admitting: Obstetrics and Gynecology

## 2021-03-12 ENCOUNTER — Other Ambulatory Visit: Payer: Self-pay

## 2021-03-12 ENCOUNTER — Emergency Department (HOSPITAL_BASED_OUTPATIENT_CLINIC_OR_DEPARTMENT_OTHER): Payer: Medicare Other

## 2021-03-12 DIAGNOSIS — I10 Essential (primary) hypertension: Secondary | ICD-10-CM | POA: Insufficient documentation

## 2021-03-12 DIAGNOSIS — R519 Headache, unspecified: Secondary | ICD-10-CM | POA: Insufficient documentation

## 2021-03-12 DIAGNOSIS — Z79899 Other long term (current) drug therapy: Secondary | ICD-10-CM | POA: Insufficient documentation

## 2021-03-12 DIAGNOSIS — R200 Anesthesia of skin: Secondary | ICD-10-CM | POA: Insufficient documentation

## 2021-03-12 DIAGNOSIS — Z7982 Long term (current) use of aspirin: Secondary | ICD-10-CM | POA: Diagnosis not present

## 2021-03-12 DIAGNOSIS — R202 Paresthesia of skin: Secondary | ICD-10-CM

## 2021-03-12 LAB — BASIC METABOLIC PANEL
Anion gap: 8 (ref 5–15)
BUN: 14 mg/dL (ref 8–23)
CO2: 29 mmol/L (ref 22–32)
Calcium: 9.5 mg/dL (ref 8.9–10.3)
Chloride: 105 mmol/L (ref 98–111)
Creatinine, Ser: 0.95 mg/dL (ref 0.44–1.00)
GFR, Estimated: >60 mL/min (ref >60)
Glucose, Bld: 95 mg/dL (ref 70–99)
Potassium: 3.4 mmol/L — ABNORMAL LOW (ref 3.5–5.1)
Sodium: 142 mmol/L (ref 135–145)

## 2021-03-12 LAB — CBC
HCT: 41.1 % (ref 36.0–46.0)
Hemoglobin: 13.4 g/dL (ref 12.0–15.0)
MCH: 31 pg (ref 26.0–34.0)
MCHC: 32.6 g/dL (ref 30.0–36.0)
MCV: 95.1 fL (ref 80.0–100.0)
Platelets: 347 10*3/uL (ref 150–400)
RBC: 4.32 MIL/uL (ref 3.87–5.11)
RDW: 12.3 % (ref 11.5–15.5)
WBC: 8.8 10*3/uL (ref 4.0–10.5)
nRBC: 0 % (ref 0.0–0.2)

## 2021-03-12 LAB — URINALYSIS, ROUTINE W REFLEX MICROSCOPIC
Bilirubin Urine: NEGATIVE
Glucose, UA: NEGATIVE mg/dL
Hgb urine dipstick: NEGATIVE
Nitrite: NEGATIVE
Protein, ur: 30 mg/dL — AB
Specific Gravity, Urine: 1.034 — ABNORMAL HIGH (ref 1.005–1.030)
pH: 6.5 (ref 5.0–8.0)

## 2021-03-12 MED ORDER — SODIUM CHLORIDE 0.9 % IV BOLUS
1000.0000 mL | Freq: Once | INTRAVENOUS | Status: AC
  Administered 2021-03-12: 1000 mL via INTRAVENOUS

## 2021-03-12 MED ORDER — DIPHENHYDRAMINE HCL 50 MG/ML IJ SOLN
25.0000 mg | Freq: Once | INTRAMUSCULAR | Status: AC
  Administered 2021-03-12: 25 mg via INTRAVENOUS

## 2021-03-12 MED ORDER — DIPHENHYDRAMINE HCL 50 MG/ML IJ SOLN
50.0000 mg | Freq: Once | INTRAMUSCULAR | Status: DC
  Filled 2021-03-12: qty 1

## 2021-03-12 MED ORDER — METOCLOPRAMIDE HCL 5 MG/ML IJ SOLN
10.0000 mg | Freq: Once | INTRAMUSCULAR | Status: AC
  Administered 2021-03-12: 10 mg via INTRAVENOUS
  Filled 2021-03-12: qty 2

## 2021-03-12 NOTE — ED Triage Notes (Signed)
Patient reports about every other day she notices numbness in the right leg and last night around 7pm she noticed it went numb and it has not regained feeling. Patient denies pain in the leg but reports heaviness. Denies changes in speech. Reports blurred vision and headaches. Patient states the blurred vision and headaches started x2 days ago.

## 2021-03-12 NOTE — Discharge Instructions (Addendum)
You came to the emergency department today to be evaluated for your headache and leg heaviness.  Your physical exam was reassuring.  The CT scan of your head showed no acute abnormalities.  The exact cause of your symptoms are unknown at this time.  Therefore I have placed an ambulatory referral for you to follow-up with [facility] neurology in outpatient setting.  If you do not hear from them in 3 business days please call to schedule follow-up appointment.    Get help right away if: Your headache: Becomes severe quickly. Gets worse after moderate to intense physical activity. You have any of these symptoms: Repeated vomiting. Pain or stiffness in your neck. Changes to your vision. Pain in an eye or ear. Problems with speech. Muscular weakness or loss of muscle control. Loss of balance or coordination. You feel faint or pass out. You have confusion. You have a seizure.

## 2021-03-12 NOTE — ED Notes (Signed)
Pt able to walk to the bathroom, per Joe - RN.

## 2021-03-12 NOTE — ED Provider Notes (Signed)
[facility] EMERGENCY DEPT Provider Note   CSN: [id number] Arrival date & time: 03/12/21  1328     History  Chief Complaint  Patient presents with   Numbness    Margaret Mitchell is a 81 y.o. female with a past medical history of hypertension, headache, anxiety, GERD, idiopathic peripheral neuropathy, thyroid disease.  Presents to the emergency department with a complaint of headache and decree sensation to right lower leg.  Patient reports that she has been dealing with headaches on and off since Friday 2/24.  States that today headache onset was sudden.  Pain started approximately 11 to 12 PM.  Pain feels like a band down to her entire head.  Patient denies any aggravating factors.  Patient has not tried any modalities to alleviate her symptoms.  Patient reports that headache location has changed over the last few days.  Patient reports that yesterday she had blurry vision with headache onset.  Patient reports minimal blurry vision today affecting both eyes.  Denies any diplopia or vision loss.  Patient reports that this headache is not similar to previous when she has had in the past.  Patient reports that yesterday evening she felt cramping behind her right knee.  Patient reports waking up this morning and feeling like her right lower leg is "heavy."  She states "it feels like I have been sitting on my leg."  Denies any swelling, color change, wounds, trouble with gait.  She denies any neck pain, neck stiffness, fever, chills, diplopia, numbness, weakness, facial asymmetry, dysarthria, seizures, lightheadedness, syncope, dizziness.  Patient denies any recent falls or injuries.  No illicit drug use.  Occasional alcohol use.  HPI     Home Medications Prior to Admission medications   Medication Sig Start Date End Date Taking? Authorizing Provider  amLODipine-benazepril (LOTREL) 5-20 MG capsule TAKE 1 CAPSULE DAILY 06/22/20   Burchette, Alinda Sierras, MD  aspirin 81 MG tablet  Take 81 mg by mouth daily.    [provider]  Calcium Carbonate-Vit D-Min (CALCIUM 1200 PO) Take by mouth.    [provider]  clotrimazole-betamethasone (LOTRISONE) cream Apply topically daily. 12/04/20   [provider]  Magnesium 400 MG CAPS Take by mouth.    [provider]  metoprolol succinate (TOPROL-XL) 50 MG 24 hr tablet TAKE 1 TABLET DAILY WITH OR IMMEDIATELY FOLLOWING A MEAL 02/05/21   Burchette, Alinda Sierras, MD  Multiple Vitamin (MULTIVITAMIN) tablet Take 1 tablet by mouth daily.    [provider]      Allergies    Other, Prednisone, Seldane [terfenadine], Sulfa antibiotics, Sulfasalazine, Sulfonamide derivatives, and Vioxx [rofecoxib]    Review of Systems   Review of Systems  Constitutional:  Negative for chills and fever.  HENT:  Negative for facial swelling.   Eyes:  Positive for visual disturbance.  Respiratory:  Negative for shortness of breath.   Cardiovascular:  Negative for chest pain.  Gastrointestinal:  Negative for abdominal pain, nausea and vomiting.  Genitourinary:  Negative for difficulty urinating, dysuria, frequency, hematuria and urgency.  Musculoskeletal:  Negative for back pain and neck pain.  Skin:  Negative for color change and rash.  Neurological:  Positive for headaches. Negative for dizziness, tremors, seizures, syncope, facial asymmetry, speech difficulty, weakness, light-headedness and numbness.  Psychiatric/Behavioral:  Negative for confusion.    Physical Exam Updated Vital Signs BP (!) 167/90 (BP Location: Right Arm)    Pulse 71    Temp 98.1 F (36.7 C)    Resp 18  Ht 5' 6.5" (1.689 m)    Wt 88.5 kg    SpO2 98%    BMI 31.00 kg/m  Physical Exam Vitals and nursing note reviewed.  Constitutional:      General: She is not in acute distress.    Appearance: She is not ill-appearing, toxic-appearing or diaphoretic.  HENT:     Head: Normocephalic and atraumatic. No raccoon eyes or Battle's sign.  Eyes:      General: No scleral icterus.       Right eye: No discharge.        Left eye: No discharge.     Extraocular Movements: Extraocular movements intact.     Conjunctiva/sclera: Conjunctivae normal.     Pupils: Pupils are equal, round, and reactive to light.  Cardiovascular:     Rate and Rhythm: Normal rate.  Pulmonary:     Effort: Pulmonary effort is normal.  Abdominal:     Palpations: Abdomen is soft.     Tenderness: There is no abdominal tenderness.  Musculoskeletal:     Cervical back: Normal range of motion and neck supple. No edema, erythema, signs of trauma, rigidity, torticollis or crepitus. No pain with movement, spinous process tenderness or muscular tenderness. Normal range of motion.     Comments: No midline tenderness or deformity to cervical, thoracic, or lumbar spine.  Skin:    General: Skin is warm and dry.  Neurological:     General: No focal deficit present.     Mental Status: She is alert.     GCS: GCS eye subscore is 4. GCS verbal subscore is 5. GCS motor subscore is 6.     Cranial Nerves: No cranial nerve deficit or facial asymmetry.     Motor: No weakness, tremor, seizure activity or pronator drift.     Coordination: Finger-Nose-Finger Test normal.     Gait: Gait is intact. Gait normal.     Comments: CN II-XII intact; performed in supine position, +5 strength to bilateral upper extremities, +5 strength to dorsiflexion and plantarflexion, patient able to lift both legs against gravity and hold each there without difficulty.  Patient reports decree sensation to light touch to right lower extremity from knee to her foot.  States "feels like there is a sheet over it."  Patient is able to distinguish obtained sharp and dull touch throughout bilateral lower extremities.  Psychiatric:        Behavior: Behavior is cooperative.    ED Results / Procedures / Treatments   Labs (all labs ordered are listed, but only abnormal results are displayed) Labs Reviewed  URINALYSIS,  ROUTINE W REFLEX MICROSCOPIC - Abnormal; Notable for the following components:      Result Value   APPearance HAZY (*)    Specific Gravity, Urine 1.034 (*)    Ketones, ur TRACE (*)    Protein, ur 30 (*)    Leukocytes,Ua SMALL (*)    Non Squamous Epithelial 0-5 (*)    All other components within normal limits  CBC  BASIC METABOLIC PANEL    EKG None  Radiology CT Head Wo Contrast  Result Date: 03/12/2021 CLINICAL DATA:  Provided history: Headache, sudden, severe; headache, new or worsening. Additional history provided: Patient reports episodes of extremity numbness last night, leg heaviness, blurred vision, headaches. EXAM: CT HEAD WITHOUT CONTRAST TECHNIQUE: Contiguous axial images were obtained from the base of the skull through the vertex without intravenous contrast. RADIATION DOSE REDUCTION: This exam was performed according to the departmental dose-optimization program which includes  automated exposure control, adjustment of the mA and/or kV according to patient size and/or use of iterative reconstruction technique. COMPARISON:  MRI brain and MRA head 11/01/2010. Head CT 08/31/2010. FINDINGS: Brain: Mild generalized cerebral atrophy. Mild patchy and ill-defined hypoattenuation within the cerebral white matter, nonspecific but compatible with chronic small vessel ischemic disease. There is no acute intracranial hemorrhage. No demarcated cortical infarct. No extra-axial fluid collection. No evidence of an intracranial mass. No midline shift. Vascular: No hyperdense vessel. Atherosclerotic calcifications. Skull: Normal. Negative for fracture or focal lesion. Sinuses/Orbits: Visualized orbits show no acute finding. No significant paranasal sinus disease at the imaged levels. IMPRESSION: No evidence of acute intracranial abnormality. Mild chronic small vessel ischemic changes within the cerebral white matter. Mild generalized cerebral atrophy. Electronically Signed   By: Kellie Simmering D.O.   On:  03/12/2021 16:07    Procedures Procedures    Medications Ordered in ED Medications  metoCLOPramide (REGLAN) injection 10 mg (has no administration in time range)  diphenhydrAMINE (BENADRYL) injection 50 mg (has no administration in time range)  sodium chloride 0.9 % bolus 1,000 mL (has no administration in time range)    ED Course/ Medical Decision Making/ A&P                           Medical Decision Making Amount and/or Complexity of Data Reviewed Labs: ordered. Radiology: ordered.  Risk Prescription drug management.   Alert 81 year old female no acute distress, nontoxic-appearing.  Presents emergency department with a chief complaint of headache and decree sensation to right lower extremity.  Information was obtained from patient and patient's friend at bedside.  Past medical records reviewed including previous provider notes and labs.  Patient has past medical history as outlined in HPI which complicates her care.  Per chart review patient previously seen by [facility] neurology most recently 08/2019.  Patient was diagnosed with idiopathic peripheral neuropathy with progression into the lower legs.  Due to report of sudden onset of headache will obtain CT head to evaluate for acute intracranial abnormality as well as subarachnoid hemorrhage as patient is headache onset is within 4 hours.  Additionally will check BMP, CBC, and urinalysis.  Patient given migraine cocktail for her headache.  Patient reports improvement in headache and heavy feeling to lower extremity after receiving migraine cocktail.  Patient continues to have no focal neurological deficit.  Will consult neurology due to patient's reports of new paresthesia in the setting of headache.  63 spoke with on-call neurologist Dr. Quinn Axe who advised no further work-up needed for TIA at this time.  We will plan to discharge patient to follow-up with primary neurology in the outpatient setting.  Patient care discussed  with attending physician Dr. Matilde Sprang.         Final Clinical Impression(s) / ED Diagnoses Final diagnoses:  None    Rx / DC Orders ED Discharge Orders     None         Dyann Ruddle 03/13/21 0208    Teressa Lower, MD 03/13/21 1735

## 2021-03-14 ENCOUNTER — Encounter: Payer: Self-pay | Admitting: Neurology

## 2021-04-16 ENCOUNTER — Encounter: Payer: Self-pay | Admitting: Family Medicine

## 2021-06-18 ENCOUNTER — Other Ambulatory Visit: Payer: Self-pay | Admitting: Family Medicine

## 2021-06-18 DIAGNOSIS — I10 Essential (primary) hypertension: Secondary | ICD-10-CM

## 2021-06-21 DIAGNOSIS — S90851A Superficial foreign body, right foot, initial encounter: Secondary | ICD-10-CM | POA: Diagnosis not present

## 2021-06-21 DIAGNOSIS — S90851D Superficial foreign body, right foot, subsequent encounter: Secondary | ICD-10-CM | POA: Diagnosis not present

## 2021-07-02 NOTE — Progress Notes (Unsigned)
NEUROLOGY FOLLOW UP OFFICE NOTE  Margaret Mitchell [id number]  Assessment/Plan:   Idiopathic polyneuropathy Probable right lumbosacral radiculopathy   1  Refer to physical therapy 2  Check neuropathy labs:  ANA, sed rate, B12, TSH, ACE, SPEP/IFE 3  Follow up with Dr. Posey Pronto in 4 to 5 months.  Subjective:  Margaret Mitchell is a 81 year old female who follows up to discuss numbness and headache.  History supplemented by ED note and neurology notes.  Patient followed by Dr. Posey Pronto.  Patient has longstanding history of polyneuropathy since at least 2013 which has steadily progressed in both feet.  No diabetes.  Notes numbness and tingling but no real pain. On 03/12/2021, she woke up that morning with one of her habitual migraines but noted worsening numbness in the right lower leg and foot as well as cramping behind the right knee  No weakness.  She was advised to go to the ED where CT head personally reviewed showed mild chronic small vessel ischemic changes but no acute abnormality.  The right lower leg below the knee and foot still feels asymmetrically more numb than the left leg.  She does report right sided lower back pain that sometimes radiates down the right buttock.     PAST MEDICAL HISTORY: Past Medical History:  Diagnosis Date   Allergy, unspecified not elsewhere classified    Anxiety    Arthritis    Cervical dysplasia    prior to hysterectomy   GERD (gastroesophageal reflux disease)    Headache(784.0)    Hypertension    IBS (irritable bowel syndrome)    Osteopenia 12/2015   T score -1.1 FRAX 3.7%/0.4%   Pelvic adhesions    Pelvic pain in female    Sinus infection    Thyroid disease    Parathyroid cyst    MEDICATIONS: Current Outpatient Medications on File Prior to Visit  Medication Sig Dispense Refill   amLODipine-benazepril (LOTREL) 5-20 MG capsule TAKE 1 CAPSULE DAILY 90 capsule 0   aspirin 81 MG tablet Take 81 mg by mouth daily.     Calcium Carbonate-Vit  D-Min (CALCIUM 1200 PO) Take by mouth.     clotrimazole-betamethasone (LOTRISONE) cream Apply topically daily.     Magnesium 400 MG CAPS Take by mouth.     metoprolol succinate (TOPROL-XL) 50 MG 24 hr tablet TAKE 1 TABLET DAILY WITH OR IMMEDIATELY FOLLOWING A MEAL 90 tablet 3   Multiple Vitamin (MULTIVITAMIN) tablet Take 1 tablet by mouth daily.     No current facility-administered medications on file prior to visit.    ALLERGIES: Allergies  Allergen Reactions   Other     Seldane   Prednisone     REACTION: heart palpatations  In larger doses   Seldane [Terfenadine]     SELDANE   Sulfa Antibiotics Other (See Comments)    REACTION: pt unable to remember REACTION: pt unable to remember   Sulfasalazine Other (See Comments)    REACTION: pt unable to remember   Sulfonamide Derivatives     REACTION: pt unable to remember   Vioxx [Rofecoxib]     REACTION: mouth swelling (VIOXX)    FAMILY HISTORY: Family History  Problem Relation Age of Onset   Diabetes Mother    Lung cancer Father    Leukemia Brother    Hypertension Maternal Grandmother    Heart disease Maternal Grandmother    Diabetes Maternal Grandmother    Stroke Maternal Grandmother    Diabetes Maternal Aunt  Heart failure Paternal Grandmother       Objective:  Blood pressure (!) 150/85, pulse 70, height '5\' 6"'$  (1.676 m), weight 199 lb 9.6 oz (90.5 kg), SpO2 100 %. General: No acute distress.  Patient appears well-groomed.   Head:  Normocephalic/atraumatic Eyes:  Fundi examined but not visualized Neck: supple, no paraspinal tenderness, full range of motion Heart:  Regular rate and rhythm Back: No paraspinal tenderness Neurological Exam: alert and oriented to person, place, and time.  Speech fluent and not dysarthric, language intact.  CN II-XII intact. Bulk and tone normal, muscle strength 5/5 throughout.  Sensation to pinprick reduced in the dorsum and bottom of right foot as well as entire right leg below the knee.   Vibratory sensation reduced in both feet.  Deep tendon reflexes 2+ throughout except absent in ankles, toes downgoing.  Finger to nose testing intact.  Gait steady, Romberg negative.   Metta Clines, DO  CC: Carolann Littler, MD

## 2021-07-03 ENCOUNTER — Ambulatory Visit (INDEPENDENT_AMBULATORY_CARE_PROVIDER_SITE_OTHER): Payer: Medicare Other | Admitting: Neurology

## 2021-07-03 ENCOUNTER — Other Ambulatory Visit: Payer: Medicare Other

## 2021-07-03 ENCOUNTER — Encounter: Payer: Self-pay | Admitting: Neurology

## 2021-07-03 VITALS — BP 150/85 | HR 70 | Ht 66.0 in | Wt 199.6 lb

## 2021-07-03 DIAGNOSIS — M5441 Lumbago with sciatica, right side: Secondary | ICD-10-CM

## 2021-07-03 DIAGNOSIS — R52 Pain, unspecified: Secondary | ICD-10-CM

## 2021-07-03 DIAGNOSIS — G629 Polyneuropathy, unspecified: Secondary | ICD-10-CM | POA: Diagnosis not present

## 2021-07-03 DIAGNOSIS — G8929 Other chronic pain: Secondary | ICD-10-CM | POA: Diagnosis not present

## 2021-07-03 NOTE — Patient Instructions (Signed)
Refer to physical therapy to help with low back pain with lumbosacral radiculopathy Check neuropathy labs - ANA, sed rate, B12, TSH, ACE, SPEP/IFE Follow up with Dr. Posey Pronto in 4-5 months.

## 2021-07-04 ENCOUNTER — Encounter: Payer: Self-pay | Admitting: Neurology

## 2021-07-04 LAB — TSH: TSH: 3.55 u[IU]/mL (ref 0.35–5.50)

## 2021-07-04 LAB — VITAMIN B12: Vitamin B-12: 535 pg/mL (ref 211–911)

## 2021-07-04 LAB — SEDIMENTATION RATE: Sed Rate: 54 mm/hr — ABNORMAL HIGH (ref 0–30)

## 2021-07-05 DIAGNOSIS — M79672 Pain in left foot: Secondary | ICD-10-CM | POA: Diagnosis not present

## 2021-07-05 DIAGNOSIS — M21622 Bunionette of left foot: Secondary | ICD-10-CM | POA: Diagnosis not present

## 2021-07-05 DIAGNOSIS — S90851D Superficial foreign body, right foot, subsequent encounter: Secondary | ICD-10-CM | POA: Diagnosis not present

## 2021-07-05 LAB — PROTEIN ELECTROPHORESIS, SERUM
Abnormal Protein Band1: 0.9 g/dL — ABNORMAL HIGH
Albumin ELP: 4.1 g/dL (ref 3.8–4.8)
Alpha 1: 0.3 g/dL (ref 0.2–0.3)
Alpha 2: 0.9 g/dL (ref 0.5–0.9)
Beta 2: 0.4 g/dL (ref 0.2–0.5)
Beta Globulin: 0.4 g/dL (ref 0.4–0.6)
Gamma Globulin: 1.5 g/dL (ref 0.8–1.7)
Total Protein: 7.5 g/dL (ref 6.1–8.1)

## 2021-07-05 LAB — ANGIOTENSIN CONVERTING ENZYME: Angiotensin-Converting Enzyme: <5 U/L — ABNORMAL LOW (ref 9–67)

## 2021-07-06 ENCOUNTER — Ambulatory Visit: Payer: Medicare Other | Attending: Neurology

## 2021-07-06 DIAGNOSIS — M5441 Lumbago with sciatica, right side: Secondary | ICD-10-CM | POA: Diagnosis not present

## 2021-07-06 DIAGNOSIS — G8929 Other chronic pain: Secondary | ICD-10-CM | POA: Diagnosis not present

## 2021-07-06 DIAGNOSIS — R293 Abnormal posture: Secondary | ICD-10-CM | POA: Insufficient documentation

## 2021-07-06 DIAGNOSIS — M6281 Muscle weakness (generalized): Secondary | ICD-10-CM | POA: Insufficient documentation

## 2021-07-06 NOTE — Therapy (Addendum)
OUTPATIENT PHYSICAL THERAPY THORACOLUMBAR EVALUATION   Patient Name: Margaret Mitchell MRN: [id number] DOB:04/10/1940, 81 y.o., female Today's Date: 07/06/2021   PT End of Session - 07/06/21 1320     Visit Number 1    Number of Visits 5    Date for PT Re-Evaluation 08/03/21    Authorization Type Medicare    PT Start Time 1318    PT Stop Time 1358    PT Time Calculation (min) 40 min    Activity Tolerance Patient tolerated treatment well    Behavior During Therapy WFL for tasks assessed/performed             Past Medical History:  Diagnosis Date   Allergy, unspecified not elsewhere classified    Anxiety    Arthritis    Cervical dysplasia    prior to hysterectomy   GERD (gastroesophageal reflux disease)    Headache(784.0)    Hypertension    IBS (irritable bowel syndrome)    Osteopenia 12/2015   T score -1.1 FRAX 3.7%/0.4%   Pelvic adhesions    Pelvic pain in female    Sinus infection    Thyroid disease    Parathyroid cyst   Past Surgical History:  Procedure Laterality Date   ABDOMINAL HYSTERECTOMY  1983   TAH/BSO for pelvic pain and adhesions   APPENDECTOMY  1994   BREAST SURGERY     Biopsy-Benign   CATARACT EXTRACTION Bilateral 2013   COLPOSCOPY     FOOT SURGERY Bilateral    PARATHYROID ADENOMA REMOVED  02/1980   ROTATOR CUFF REPAIR     X2   SHOULDER INJECTION  10/2011   TONSILLECTOMY     TUBAL LIGATION  1981   VESICOVAGINAL FISTULA CLOSURE W/ TAH     Patient Active Problem List   Diagnosis Date Noted   Depression, major, single episode, moderate (HCC) 10/21/2017   Chronic constipation 12/01/2014   Muscle cramps 06/15/2013   Obesity (BMI 30-39.9) 05/25/2013   Dyspnea 03/04/2013   Multinodular thyroid 03/04/2013   Meralgia paresthetica of left side 01/01/2013   Chest pain, atypical 11/16/2012   Osteopenia 05/13/2012   Neuropathy 12/30/2011   Restless legs syndrome (RLS) 11/12/2011   Anxiety    Cervical dysplasia    Vertigo 09/06/2011   Herpes  zoster 03/20/2011   Pelvic adhesions    Pelvic pain in female    Menorrhagia    HYPERCHOLESTEROLEMIA, BORDERLINE 11/08/2008   GAIT IMBALANCE 05/11/2008   LEG CRAMPS 04/26/2008   HIATAL HERNIA 06/03/2007   Essential hypertension 01/30/2007   GERD 01/30/2007   CONSTIPATION 01/30/2007   IRRITABLE BOWEL SYNDROME 01/30/2007   DEGENERATIVE JOINT DISEASE 01/30/2007   HEADACHE 01/30/2007   ALLERGY 01/30/2007   BENIGN NEOPLASM OTH&UNSPEC SITE DIGESTIVE SYSTEM 04/17/2005    PCP: Evelena Peat, MD  REFERRING PROVIDER: Shon Millet, DO  REFERRING DIAG: M18.41,G89.29 (ICD-10-CM) - Chronic right-sided low back pain with right-sided sciatica   Rationale for Evaluation and Treatment Rehabilitation  THERAPY DIAG:  Abnormal posture  Muscle weakness (generalized)  ONSET DATE: ~1 year ago  SUBJECTIVE:  SUBJECTIVE STATEMENT: Patient reporting back pain starting ~1 year ago. This pain caused her to remain in bed for a little time with difficulty getting out of bed. She received injections for her back pain (Cortisone), but those have since worn off. She reports that the shot lasted for ~6 months. February 27-28 of this year she reports waking up feeling as though her R LE was heavy.  R buttocks pain that she reports feels as though someone kicked her in the back.   PERTINENT HISTORY:  Polyneuropathy, R sciatica, GERD, HA  PAIN:  Are you having pain? Yes: NPRS scale: *can get up to a 10/10 Pain location: R buttock, travels down to knee, doesn't go past the knee Pain description: aching, shooting  Aggravating factors: "nothing"  Relieving factors: muscle relaxant cream    PRECAUTIONS: Back  WEIGHT BEARING RESTRICTIONS No  FALLS:  Has patient fallen in last 6 months? No  LIVING ENVIRONMENT: Lives with:  lives alone Lives in: House/apartment Stairs: No Has following equipment at home: Single point cane, shower chair, and Grab bars  OCCUPATION: retired   PLOF: Independent; driving , but reports no difficulty given  R LE symptoms  PATIENT GOALS "to be able to move more freely without fear of back acting up"   OBJECTIVE:   DIAGNOSTIC FINDINGS:  03/12/21 head CT: mild chronic small vessel ischemic changes but no acute abnormality  PATIENT SURVEYS:  Oswestry: Mild disability   SCREENING FOR RED FLAGS: Bowel or bladder incontinence: No Spinal tumors: No Cauda equina syndrome: No Compression fracture: No Abdominal aneurysm: No  COGNITION:  Overall cognitive status: Within functional limits for tasks assessed     SENSATION: She reports N/T in B feet, but relates to neuropathy    POSTURE: rounded shoulders, forward head, and increased thoracic kyphosis  LUMBAR ROM:  **all WFL with no increase in pain  Active  A/PROM  eval  Flexion   Extension   Right lateral flexion   Left lateral flexion   Right rotation   Left rotation    (Blank rows = not tested)  LOWER EXTREMITY ROM:    **all within functional limits  Active  Right eval Left eval  Hip flexion    Hip extension    Hip abduction    Hip adduction    Hip internal rotation    Hip external rotation    Knee flexion    Knee extension    Ankle dorsiflexion    Ankle plantarflexion    Ankle inversion    Ankle eversion     (Blank rows = not tested)  LOWER EXTREMITY MMT:    MMT Right eval Left eval  Hip flexion 4 4  Hip extension    Hip abduction 4 4  Hip adduction 4 4  Hip internal rotation    Hip external rotation    Knee flexion 4 4  Knee extension 4 4  Ankle dorsiflexion 4 4  Ankle plantarflexion 4 4  Ankle inversion    Ankle eversion     (Blank rows = not tested)  LUMBAR SPECIAL TESTS:  Prone instability test: Negative, Straight leg raise test: Negative, Slump test: Negative, Single leg stance  test: Positive, and SI Compression/distraction test: Negative  FUNCTIONAL TESTS:  5 times sit to stand: 14.5s B UE  Timed up and go (TUG): 8.35s no AD  10 meter walk test: .14m/s  GAIT: Distance walked: clinic Assistive device utilized: None Level of assistance: Complete Independence   TODAY'S TREATMENT  N/A  eval   PATIENT EDUCATION:  Education details: OM results, BLTs back precautions, PT POC, log roll  Person educated: Patient Education method: Medical illustrator Education comprehension: verbalized understanding   HOME EXERCISE PROGRAM: To be provided   ASSESSMENT:  CLINICAL IMPRESSION: Patient is a 81 y.o. female who was seen today for physical therapy evaluation and treatment for radiating R sided low back pain. The pain travels down the posterior of R LE and stops just proximal to knee. Denies B/B changes. Patient scoring in the Mild Disability category on the Oswestry. 10 Meter Walk Test: Normal speed: .42 m/s Cut off scores: <0.4 m/s = household Ambulator, 0.4-0.8 m/s = limited community Ambulator, >0.8 m/s = community Ambulator, >1.2 m/s = crossing a street, <1.0 = increased fall risk MCID 0.05 m/s (small), 0.13 m/s (moderate), 0.06 m/s (significant)  (ANPTA Core Set of Outcome Measures for Adults with Neurologic Conditions, 2018). Patient completed the Timed Up and Go test (TUG) in 8.35 seconds.  Geriatrics: need for further assessment of fall risk: ? 12 sec; Recurrent falls: > 15 sec; Vestibular Disorders fall risk: > 15 sec; Parkinson's Disease fall risk: > 16 sec ([email or web address], 2023). Five times Sit to Stand Test (FTSS) TIME: 14.5______ (in seconds) Times > 13.6 seconds is associated with increased disability and morbidity (Guralnik, 2000) Times > 15 seconds is predictive of recurrent falls in healthy individuals aged 79 and older (Buatois, et al., 2008) Normal performance values in community dwelling individuals aged 15 and older ([place], 2006): 70-79  years: 12.6 seconds  OBJECTIVE IMPAIRMENTS decreased activity tolerance, decreased balance, decreased endurance, decreased knowledge of condition, decreased mobility, increased muscle spasms, impaired sensation, improper body mechanics, and postural dysfunction.   ACTIVITY LIMITATIONS carrying, lifting, bending, sitting, standing, squatting, stairs, transfers, bed mobility, and locomotion level  PARTICIPATION LIMITATIONS: cleaning, laundry, driving, shopping, community activity, and yard work  PERSONAL FACTORS Age, Fitness, Past/current experiences, Time since onset of injury/illness/exacerbation, and 1 comorbidity: polyneuropathy  are also affecting patient's functional outcome.   REHAB POTENTIAL: Good  CLINICAL DECISION MAKING: Stable/uncomplicated  EVALUATION COMPLEXITY: Low   GOALS: Goals reviewed with patient? Yes STG= LTG based on POC  LONG TERM GOALS Target date: 08/03/2021  Patient will improve 5x STS to </= to 12.6s to demonstrate improved LE functional strength and power.  Baseline:14.5s Goal status: INITIAL  2.  Pt will be independent with final HEP for improved functional LE strength and mobility    Baseline: to be provided Goal status: INITIAL  3.  Pt will improve gait speed to >/= 1.2 m/sec to demonstrate improved community ambulation  Baseline: .27m/s Goal status: INITIAL  4.  Patient will complete Oswestry Low Back Disability Questionnaire and score < 4 indicating no disability related to her back pain Baseline: mild disability (6) Goal status: INITIAL  5. Patient will adhere to back precautions >75% of the time to minimize her experience of pain Baseline: patient unaware of back precautions benefit at eval Goal status: INITIAL    PLAN: PT FREQUENCY: 1x/week  PT DURATION: 4 weeks  PLANNED INTERVENTIONS: Therapeutic exercises, Therapeutic activity, Neuromuscular re-education, Balance training, Gait training, Patient/Family education, Joint  manipulation, Joint mobilization, Stair training, Visual/preceptual remediation/compensation, Orthotic/Fit training, DME instructions, Aquatic Therapy, Dry Needling, Electrical stimulation, Spinal manipulation, Spinal mobilization, Cryotherapy, Moist heat, Taping, Traction, Ultrasound, Manual therapy, and Re-evaluation.  PLAN FOR NEXT SESSION: provide HEP, SI mobs   Westley Foots, PT Westley Foots, PT, DPT, CBIS  07/06/2021, 1:59 PM

## 2021-07-09 LAB — IMMUNOFIXATION, SERUM
IgA/Immunoglobulin A, Serum: 414 mg/dL (ref 64–422)
IgG (Immunoglobin G), Serum: 1349 mg/dL (ref 586–1602)
IgM (Immunoglobulin M), Srm: 37 mg/dL (ref 26–217)

## 2021-07-09 LAB — ANA W/REFLEX: Anti Nuclear Antibody (ANA): NEGATIVE

## 2021-07-10 ENCOUNTER — Ambulatory Visit: Payer: Medicare Other

## 2021-07-10 DIAGNOSIS — R293 Abnormal posture: Secondary | ICD-10-CM | POA: Diagnosis not present

## 2021-07-10 DIAGNOSIS — M6281 Muscle weakness (generalized): Secondary | ICD-10-CM

## 2021-07-10 DIAGNOSIS — G8929 Other chronic pain: Secondary | ICD-10-CM | POA: Diagnosis not present

## 2021-07-10 DIAGNOSIS — M5441 Lumbago with sciatica, right side: Secondary | ICD-10-CM | POA: Diagnosis not present

## 2021-07-19 ENCOUNTER — Ambulatory Visit: Payer: Medicare Other | Attending: Neurology

## 2021-07-19 DIAGNOSIS — R293 Abnormal posture: Secondary | ICD-10-CM | POA: Insufficient documentation

## 2021-07-19 DIAGNOSIS — M6281 Muscle weakness (generalized): Secondary | ICD-10-CM | POA: Diagnosis not present

## 2021-07-19 NOTE — Therapy (Signed)
OUTPATIENT PHYSICAL THERAPY TREATMENT NOTE   Patient Name: Margaret Mitchell MRN: [id number] DOB:23-Oct-1940, 81 y.o., female Today's Date: 07/19/2021  PCP: Evelena Peat, MD REFERRING PROVIDER: Shon Millet, DO  END OF SESSION:   PT End of Session - 07/19/21 1522     Visit Number 3    Number of Visits 5    Date for PT Re-Evaluation 08/03/21    Authorization Type Medicare             Past Medical History:  Diagnosis Date   Allergy, unspecified not elsewhere classified    Anxiety    Arthritis    Cervical dysplasia    prior to hysterectomy   GERD (gastroesophageal reflux disease)    Headache(784.0)    Hypertension    IBS (irritable bowel syndrome)    Osteopenia 12/2015   T score -1.1 FRAX 3.7%/0.4%   Pelvic adhesions    Pelvic pain in female    Sinus infection    Thyroid disease    Parathyroid cyst   Past Surgical History:  Procedure Laterality Date   ABDOMINAL HYSTERECTOMY  1983   TAH/BSO for pelvic pain and adhesions   APPENDECTOMY  1994   BREAST SURGERY     Biopsy-Benign   CATARACT EXTRACTION Bilateral 2013   COLPOSCOPY     FOOT SURGERY Bilateral    PARATHYROID ADENOMA REMOVED  02/1980   ROTATOR CUFF REPAIR     X2   SHOULDER INJECTION  10/2011   TONSILLECTOMY     TUBAL LIGATION  1981   VESICOVAGINAL FISTULA CLOSURE W/ TAH     Patient Active Problem List   Diagnosis Date Noted   Depression, major, single episode, moderate (HCC) 10/21/2017   Chronic constipation 12/01/2014   Muscle cramps 06/15/2013   Obesity (BMI 30-39.9) 05/25/2013   Dyspnea 03/04/2013   Multinodular thyroid 03/04/2013   Meralgia paresthetica of left side 01/01/2013   Chest pain, atypical 11/16/2012   Osteopenia 05/13/2012   Neuropathy 12/30/2011   Restless legs syndrome (RLS) 11/12/2011   Anxiety    Cervical dysplasia    Vertigo 09/06/2011   Herpes zoster 03/20/2011   Pelvic adhesions    Pelvic pain in female    Menorrhagia    HYPERCHOLESTEROLEMIA, BORDERLINE 11/08/2008    GAIT IMBALANCE 05/11/2008   LEG CRAMPS 04/26/2008   HIATAL HERNIA 06/03/2007   Essential hypertension 01/30/2007   GERD 01/30/2007   CONSTIPATION 01/30/2007   IRRITABLE BOWEL SYNDROME 01/30/2007   DEGENERATIVE JOINT DISEASE 01/30/2007   HEADACHE 01/30/2007   ALLERGY 01/30/2007   BENIGN NEOPLASM OTH&UNSPEC SITE DIGESTIVE SYSTEM 04/17/2005    REFERRING DIAG: M54.41,G89.29 (ICD-10-CM) - Chronic right-sided low back pain with right-sided sciatica  THERAPY DIAG:  Abnormal posture  Muscle weakness (generalized)  Rationale for Evaluation and Treatment Rehabilitation  PERTINENT HISTORY: Polyneuropathy, R sciatica, GERD, HA  PRECAUTIONS: Fall, back  SUBJECTIVE: Patient reports doing well-   PAIN:  Are you having pain? Yes: NPRS scale: 5/10 Pain location: low back  Pain description: aching, not traveling  Aggravating factors: making the bed, standing for > "a few mins" Relieving factors: "time" stopping the aggravating activity    TODAY'S TREATMENT  Theract: -Extensive conversation regarding body mechanics and posture -practice lifting 5# with proper mechanics x10 repetitions -simulate making bed with proper mechanics      PATIENT EDUCATION:  Education details: walking program, posture, body mechanics Person educated: Patient Education method: Medical illustrator Education comprehension: verbalized understanding     HOME EXERCISE PROGRAM: Access Code: [employer]  URL: [email or web address] Date: 07/10/2021 Prepared by: Merry Lofty  Exercises - Supine March  - 1 x daily - 7 x weekly - 3 sets - 10 reps - Supine Lower Trunk Rotation  - 1 x daily - 7 x weekly - 3 sets - 10 reps - Supine Posterior Pelvic Tilt  - 1 x daily - 7 x weekly - 3 sets - 10 reps - Supine Transversus Abdominis Bracing - Hands on Stomach  - 1 x daily - 7 x weekly - 3 sets - 10 reps - Clamshell  - 1 x daily - 7 x weekly - 3 sets - 10 reps - Dead Bug  - 1 x daily - 7 x  weekly - 3 sets - 10 reps  - Seated Slump Nerve Glide  - 1 x daily - 7 x weekly - 3 sets - 10 reps  You Can Walk For A Certain Length Of Time Each Day                          Walk 5 minutes 3 times per day.             Increase 5  minutes every 2 days              Work up to 25 minutes (1-2 times per day).               Example:                         Day 1-2           4-5 minutes     3 times per day                         Day 7-8           10-12 minutes 2-3 times per day                         Day 13-14       20-22 minutes 1-2 times per day   ASSESSMENT:   CLINICAL IMPRESSION: Patient seen for skilled PT session with emphasis on posture and body mechanics re-training. Patient with tendency toward slumped posture with limited endurance for maintaining proper upright posture. Patient frequently hinging over instead of lifting with her legs exacerbating her back pain. Patient with immediate relief of back pain when lifting with proper mechanics. Discussed modifications for safely making bed adhering to back precautions. Provided patient with walking program in addition to HEP. Continue POC.   OBJECTIVE IMPAIRMENTS decreased activity tolerance, decreased balance, decreased endurance, decreased knowledge of condition, decreased mobility, increased muscle spasms, impaired sensation, improper body mechanics, and postural dysfunction.    ACTIVITY LIMITATIONS carrying, lifting, bending, sitting, standing, squatting, stairs, transfers, bed mobility, and locomotion level   PARTICIPATION LIMITATIONS: cleaning, laundry, driving, shopping, community activity, and yard work   PERSONAL FACTORS Age, Fitness, Past/current experiences, Time since onset of injury/illness/exacerbation, and 1 comorbidity: polyneuropathy  are also affecting patient's functional outcome.    REHAB POTENTIAL: Good   CLINICAL DECISION MAKING: Stable/uncomplicated   EVALUATION COMPLEXITY: Low     GOALS: Goals reviewed  with patient? Yes STG= LTG based on POC   LONG TERM GOALS Target date: 08/03/2021   Patient will improve 5x STS to </= to 12.6s to demonstrate improved LE functional strength and power.  Baseline:14.5s Goal status: INITIAL   2.  Pt will be independent with final HEP for improved functional LE strength and mobility     Baseline: to be provided Goal status: INITIAL   3.  Pt will improve gait speed to >/= 1.2 m/sec to demonstrate improved community ambulation   Baseline: .3m/s Goal status: INITIAL   4.  Patient will complete Oswestry Low Back Disability Questionnaire and score < 4 indicating no disability related to her back pain Baseline: mild disability (6) Goal status: INITIAL   5. Patient will adhere to back precautions >75% of the time to minimize her experience of pain Baseline: patient unaware of back precautions benefit at eval Goal status: INITIAL       PLAN: PT FREQUENCY: 1x/week   PT DURATION: 4 weeks   PLANNED INTERVENTIONS: Therapeutic exercises, Therapeutic activity, Neuromuscular re-education, Balance training, Gait training, Patient/Family education, Joint manipulation, Joint mobilization, Stair training, Visual/preceptual remediation/compensation, Orthotic/Fit training, DME instructions, Aquatic Therapy, Dry Needling, Electrical stimulation, Spinal manipulation, Spinal mobilization, Cryotherapy, Moist heat, Taping, Traction, Ultrasound, Manual therapy, and Re-evaluation.   PLAN FOR NEXT SESSION: assess goals/ dc     Westley Foots, PT Westley Foots, PT, DPT, CBIS  07/19/2021, 3:24 PM

## 2021-07-20 ENCOUNTER — Encounter: Payer: Self-pay | Admitting: Family Medicine

## 2021-07-20 ENCOUNTER — Ambulatory Visit (INDEPENDENT_AMBULATORY_CARE_PROVIDER_SITE_OTHER): Payer: Medicare Other | Admitting: Family Medicine

## 2021-07-20 VITALS — BP 122/80 | HR 75 | Temp 98.0°F | Ht 66.0 in | Wt 199.4 lb

## 2021-07-20 DIAGNOSIS — G629 Polyneuropathy, unspecified: Secondary | ICD-10-CM | POA: Diagnosis not present

## 2021-07-20 DIAGNOSIS — I10 Essential (primary) hypertension: Secondary | ICD-10-CM | POA: Diagnosis not present

## 2021-07-20 NOTE — Progress Notes (Signed)
Established Patient Office Visit  Subjective   Patient ID: Margaret Mitchell, female    DOB: Jul 12, 1940  Age: 81 y.o. MRN: [id number]  Chief Complaint  Patient presents with   Follow-up    HPI   Margaret Mitchell is here for medical follow-up.  She just got back from a trip to [address] and [address]  She had a good trip and enjoyed her visit there.  She has chronic problems including hypertension, irritable bowel syndrome, history of depression.  Blood pressures been well controlled.  Denies any recent chest pains or palpitations.  No dizziness.  She hopes to lose some weight.  Trying to walk more.  Taking over-the-counter supplement for muscle cramps that seems to be helping.  Supplement has small dose of melatonin and magnesium. She has idiopathic peripheral neuropathy.  Has seen neurology.  Had multiple recent labs unremarkable except for nonspecific mild elevation of sed rate.  Was instructed to try to increase her walking to 30 mg twice daily.  Past Medical History:  Diagnosis Date   Allergy, unspecified not elsewhere classified    Anxiety    Arthritis    Cervical dysplasia    prior to hysterectomy   GERD (gastroesophageal reflux disease)    Headache(784.0)    Hypertension    IBS (irritable bowel syndrome)    Osteopenia 12/2015   T score -1.1 FRAX 3.7%/0.4%   Pelvic adhesions    Pelvic pain in female    Sinus infection    Thyroid disease    Parathyroid cyst   Past Surgical History:  Procedure Laterality Date   ABDOMINAL HYSTERECTOMY  1983   TAH/BSO for pelvic pain and adhesions   APPENDECTOMY  1994   BREAST SURGERY     Biopsy-Benign   CATARACT EXTRACTION Bilateral 2013   COLPOSCOPY     FOOT SURGERY Bilateral    PARATHYROID ADENOMA REMOVED  02/1980   ROTATOR CUFF REPAIR     X2   SHOULDER INJECTION  10/2011   TONSILLECTOMY     TUBAL LIGATION  1981   VESICOVAGINAL FISTULA CLOSURE W/ TAH      reports that she has never smoked. She has never used  smokeless tobacco. She reports current alcohol use of about 1.0 standard drink of alcohol per week. She reports that she does not use drugs. family history includes Dementia in her mother; Diabetes in her maternal aunt, maternal grandmother, and mother; Heart disease in her maternal grandmother; Heart failure in her paternal grandmother; Hypertension in her maternal grandmother; Leukemia in her brother; Lung cancer in her father; Stroke in her maternal grandmother. Allergies  Allergen Reactions   Other     Seldane   Prednisone     REACTION: heart palpatations  In larger doses   Seldane [Terfenadine]     SELDANE   Sulfa Antibiotics Other (See Comments)    REACTION: pt unable to remember REACTION: pt unable to remember   Sulfasalazine Other (See Comments)    REACTION: pt unable to remember   Sulfonamide Derivatives     REACTION: pt unable to remember   Vioxx [Rofecoxib]     REACTION: mouth swelling (VIOXX)    Review of Systems  Constitutional:  Negative for malaise/fatigue.  Eyes:  Negative for blurred vision.  Respiratory:  Negative for shortness of breath.   Cardiovascular:  Negative for chest pain.  Neurological:  Negative for dizziness, weakness and headaches.      Objective:     BP 122/80 (BP Location:  Left Arm, Patient Position: Sitting, Cuff Size: Large)   Pulse 75   Temp 98 F (36.7 C) (Oral)   Ht 5\' 6"  (1.676 m)   Wt 199 lb 6.4 oz (90.4 kg)   SpO2 98%   BMI 32.18 kg/m  BP Readings from Last 3 Encounters:  07/20/21 122/80  07/03/21 (!) 150/85  03/12/21 (!) 154/74   Wt Readings from Last 3 Encounters:  07/20/21 199 lb 6.4 oz (90.4 kg)  07/03/21 199 lb 9.6 oz (90.5 kg)  03/12/21 195 lb (88.5 kg)      Physical Exam Vitals reviewed.  Constitutional:      Appearance: Normal appearance.  Cardiovascular:     Rate and Rhythm: Normal rate and regular rhythm.  Pulmonary:     Effort: Pulmonary effort is normal.     Breath sounds: Normal breath sounds. No  wheezing or rales.  Musculoskeletal:     Right lower leg: No edema.     Left lower leg: No edema.  Neurological:     Mental Status: She is alert.      No results found for any visits on 07/20/21.    The 10-year ASCVD risk score (Arnett DK, et al., 2019) is: 18.4%    Assessment & Plan:   Problem List Items Addressed This Visit       Unprioritized   Essential hypertension - Primary   Neuropathy  Blood pressure stable.  Continue medications as above. Recheck in 6 months and check basic chemistries at follow-up  Return in about 6 months (around 01/20/2022).    Evelena Peat, MD

## 2021-07-26 ENCOUNTER — Ambulatory Visit: Payer: Medicare Other

## 2021-07-26 DIAGNOSIS — M6281 Muscle weakness (generalized): Secondary | ICD-10-CM | POA: Diagnosis not present

## 2021-07-26 DIAGNOSIS — R293 Abnormal posture: Secondary | ICD-10-CM | POA: Diagnosis not present

## 2021-07-26 NOTE — Therapy (Signed)
OUTPATIENT PHYSICAL THERAPY TREATMENT NOTE/ DISCHARGE SUMMARY   Patient Name: EVONA ALSTROM MRN: [id number] DOB:[birth year], 81 y.o., female Today's Date: 07/26/2021  PCP: Evelena Peat, MD REFERRING PROVIDER: Shon Millet, DO   PHYSICAL THERAPY DISCHARGE SUMMARY  Visits from Start of Care: 4  Current functional level related to goals / functional outcomes: Patient remains independent/modified independent with reports of significant improvement in her back pain.    Remaining deficits: Intermittent R sciatic nerve pain flare up- patient reports that with proper body mechanics, this pain does not flare up   Education / Equipment: HEP, body mechanics, non-pharmacological pain management of LBP   Patient agrees to discharge. Patient goals were partially met. Patient is being discharged due to meeting the stated rehab goals.  END OF SESSION:   PT End of Session - 07/26/21 1006     Visit Number 4    Number of Visits 5    Date for PT Re-Evaluation 08/03/21    Authorization Type Medicare    PT Start Time 1015    PT Stop Time 1044    PT Time Calculation (min) 29 min    Activity Tolerance Patient tolerated treatment well    Behavior During Therapy WFL for tasks assessed/performed             Past Medical History:  Diagnosis Date   Allergy, unspecified not elsewhere classified    Anxiety    Arthritis    Cervical dysplasia    prior to hysterectomy   GERD (gastroesophageal reflux disease)    Headache(784.0)    Hypertension    IBS (irritable bowel syndrome)    Osteopenia 12/2015   T score -1.1 FRAX 3.7%/0.4%   Pelvic adhesions    Pelvic pain in female    Sinus infection    Thyroid disease    Parathyroid cyst   Past Surgical History:  Procedure Laterality Date   ABDOMINAL HYSTERECTOMY  1983   TAH/BSO for pelvic pain and adhesions   APPENDECTOMY  1994   BREAST SURGERY     Biopsy-Benign   CATARACT EXTRACTION Bilateral 2013   COLPOSCOPY     FOOT SURGERY  Bilateral    PARATHYROID ADENOMA REMOVED  02/1980   ROTATOR CUFF REPAIR     X2   SHOULDER INJECTION  10/2011   TONSILLECTOMY     TUBAL LIGATION  1981   VESICOVAGINAL FISTULA CLOSURE W/ TAH     Patient Active Problem List   Diagnosis Date Noted   Depression, major, single episode, moderate (HCC) 10/21/2017   Chronic constipation 12/01/2014   Muscle cramps 06/15/2013   Obesity (BMI 30-39.9) 05/25/2013   Dyspnea 03/04/2013   Multinodular thyroid 03/04/2013   Meralgia paresthetica of left side 01/01/2013   Chest pain, atypical 11/16/2012   Osteopenia 05/13/2012   Neuropathy 12/30/2011   Restless legs syndrome (RLS) 11/12/2011   Anxiety    Cervical dysplasia    Vertigo 09/06/2011   Herpes zoster 03/20/2011   Pelvic adhesions    Pelvic pain in female    Menorrhagia    HYPERCHOLESTEROLEMIA, BORDERLINE 11/08/2008   GAIT IMBALANCE 05/11/2008   LEG CRAMPS 04/26/2008   HIATAL HERNIA 06/03/2007   Essential hypertension 01/30/2007   GERD 01/30/2007   CONSTIPATION 01/30/2007   IRRITABLE BOWEL SYNDROME 01/30/2007   DEGENERATIVE JOINT DISEASE 01/30/2007   HEADACHE 01/30/2007   ALLERGY 01/30/2007   BENIGN NEOPLASM OTH&UNSPEC SITE DIGESTIVE SYSTEM 04/17/2005    REFERRING DIAG: M54.41,G89.29 (ICD-10-CM) - Chronic right-sided low back pain with right-sided sciatica  THERAPY DIAG:  Abnormal posture  Muscle weakness (generalized)  Rationale for Evaluation and Treatment Rehabilitation  PERTINENT HISTORY: Polyneuropathy, R sciatica, GERD, HA  PRECAUTIONS: Fall, back  SUBJECTIVE: Patient reports doing well- states that back is no longer hurting after making body mechanics changes provided last time.   PAIN:  Are you having pain? No   TODAY'S TREATMENT  Theract: Goal assessment  [facility] PT Assessment - 07/26/21 0001       Standardized Balance Assessment   Standardized Balance Assessment 10 meter walk test    Five times sit to stand comments  9.97s    10 Meter Walk .40m/s             -added piriformis stretch to HEP (provided instructions on sitting and supine)    PATIENT EDUCATION:  Education details: OM results; PT POC DC Person educated: Patient Education method: Explanation and Demonstration Education comprehension: verbalized understanding     HOME EXERCISE PROGRAM: Access Code: [email or web address] URL: [email or web address] Date: 07/10/2021 Prepared by: Merry Lofty  Exercises - Supine March  - 1 x daily - 7 x weekly - 3 sets - 10 reps - Supine Lower Trunk Rotation  - 1 x daily - 7 x weekly - 3 sets - 10 reps - Supine Posterior Pelvic Tilt  - 1 x daily - 7 x weekly - 3 sets - 10 reps - Supine Transversus Abdominis Bracing - Hands on Stomach  - 1 x daily - 7 x weekly - 3 sets - 10 reps - Clamshell  - 1 x daily - 7 x weekly - 3 sets - 10 reps - Dead Bug  - 1 x daily - 7 x weekly - 3 sets - 10 reps  - Seated Slump Nerve Glide  - 1 x daily - 7 x weekly - 3 sets - 10 reps - Supine Figure 4 Piriformis Stretch  - 1 x daily - 7 x weekly - 3 sets - 10 reps - Seated Piriformis Stretch with Trunk Bend  - 1 x daily - 7 x weekly - 3 sets - 10 reps  You Can Walk For A Certain Length Of Time Each Day                          Walk 5 minutes 3 times per day.             Increase 5  minutes every 2 days              Work up to 25 minutes (1-2 times per day).               Example:                         Day 1-2           4-5 minutes     3 times per day                         Day 7-8           10-12 minutes 2-3 times per day                         Day 13-14       20-22 minutes 1-2 times per day   ASSESSMENT:   CLINICAL IMPRESSION: Patient seen for skilled PT session with emphasis on  goal assessment. 10 Meter Walk Test: Patient instructed to walk 10 meters (32.8 ft) as quickly and as safely as possible at their normal speed x2 and at a fast speed x2. Time measured from 2 meter mark to 8 meter mark to accommodate ramp-up and ramp-down.  Normal  speed 1: .67m/s (significant improvement in gait speed)  Cut off scores: <0.4 m/s = household Ambulator, 0.4-0.8 m/s = limited community Ambulator, >0.8 m/s = community Ambulator, >1.2 m/s = crossing a street, <1.0 = increased fall risk MCID 0.05 m/s (small), 0.13 m/s (moderate), 0.06 m/s (significant) (ANPTA Core Set of Outcome Measures for Adults with Neurologic Conditions, 2018). Five times Sit to Stand Test (FTSS) Method: "Stand up and sit down as quickly as possible 5 times, keeping your arms folded across your chest."   Measurement: Stop timing when the participant touches the chair in sitting the 5th time.  TIME: 9.97s sec  Cut off scores indicative of increased fall risk: >12 sec CVA, >16 sec PD, >13 sec vestibular (ANPTA Core Set of Outcome Measures for Adults with Neurologic Conditions, 2018). Patient scored a 1 on the Oswestry indicative of no disability related to her back pain. This is improved from a score of 6. Patient met 4/5 LTGs and is agreeable to d/c with education to continue exercises, safe stretches and walking program.    OBJECTIVE IMPAIRMENTS decreased activity tolerance, decreased balance, decreased endurance, decreased knowledge of condition, decreased mobility, increased muscle spasms, impaired sensation, improper body mechanics, and postural dysfunction.    ACTIVITY LIMITATIONS carrying, lifting, bending, sitting, standing, squatting, stairs, transfers, bed mobility, and locomotion level   PARTICIPATION LIMITATIONS: cleaning, laundry, driving, shopping, community activity, and yard work   PERSONAL FACTORS Age, Fitness, Past/current experiences, Time since onset of injury/illness/exacerbation, and 1 comorbidity: polyneuropathy  are also affecting patient's functional outcome.    REHAB POTENTIAL: Good   CLINICAL DECISION MAKING: Stable/uncomplicated   EVALUATION COMPLEXITY: Low     GOALS: Goals reviewed with patient? Yes STG= LTG based on POC   LONG TERM  GOALS Target date: 08/03/2021   Patient will improve 5x STS to </= to 12.6s to demonstrate improved LE functional strength and power.  Baseline:14.5s; 9.97s Goal status: MET   2.  Pt will be independent with final HEP for improved functional LE strength and mobility     Baseline: provided  Goal status: MET   3.  Pt will improve gait speed to >/= 1.2 m/sec to demonstrate improved community ambulation   Baseline: .63m/s; .64m/s Goal status: NOT MET    4.  Patient will complete Oswestry Low Back Disability Questionnaire and score < 4 indicating no disability related to her back pain Baseline: mild disability (6); no disability (1) Goal status: MET   5. Patient will adhere to back precautions >75% of the time to minimize her experience of pain Baseline: patient unaware of back precautions benefit at eval; patient educated and reports that she is adherent  Goal status: MET       PLAN: PT FREQUENCY: 1x/week   PT DURATION: 4 weeks   PLANNED INTERVENTIONS: Therapeutic exercises, Therapeutic activity, Neuromuscular re-education, Balance training, Gait training, Patient/Family education, Joint manipulation, Joint mobilization, Stair training, Visual/preceptual remediation/compensation, Orthotic/Fit training, DME instructions, Aquatic Therapy, Dry Needling, Electrical stimulation, Spinal manipulation, Spinal mobilization, Cryotherapy, Moist heat, Taping, Traction, Ultrasound, Manual therapy, and Re-evaluation.   PLAN FOR NEXT SESSION: patient dc from PT    Westley Foots, PT Westley Foots, PT, DPT, CBIS  07/26/2021, 10:50  AM

## 2021-08-02 ENCOUNTER — Ambulatory Visit: Payer: Medicare Other

## 2021-08-08 DIAGNOSIS — Z961 Presence of intraocular lens: Secondary | ICD-10-CM | POA: Diagnosis not present

## 2021-08-08 DIAGNOSIS — H04123 Dry eye syndrome of bilateral lacrimal glands: Secondary | ICD-10-CM | POA: Diagnosis not present

## 2021-08-08 DIAGNOSIS — H35373 Puckering of macula, bilateral: Secondary | ICD-10-CM | POA: Diagnosis not present

## 2021-08-08 DIAGNOSIS — H26493 Other secondary cataract, bilateral: Secondary | ICD-10-CM | POA: Diagnosis not present

## 2021-09-17 ENCOUNTER — Other Ambulatory Visit: Payer: Self-pay | Admitting: Family Medicine

## 2021-09-17 DIAGNOSIS — I10 Essential (primary) hypertension: Secondary | ICD-10-CM

## 2021-09-25 ENCOUNTER — Ambulatory Visit (INDEPENDENT_AMBULATORY_CARE_PROVIDER_SITE_OTHER): Payer: Medicare Other

## 2021-09-25 VITALS — BP 120/60 | HR 68 | Temp 97.9°F | Ht 66.0 in | Wt 197.5 lb

## 2021-09-25 DIAGNOSIS — Z0000 Encounter for general adult medical examination without abnormal findings: Secondary | ICD-10-CM

## 2021-09-25 DIAGNOSIS — Z23 Encounter for immunization: Secondary | ICD-10-CM

## 2021-09-25 NOTE — Patient Instructions (Signed)
Margaret Mitchell , Thank you for taking time to come for your Medicare Wellness Visit. I appreciate your ongoing commitment to your health goals. Please review the following plan we discussed and let me know if I can assist you in the future.   Screening recommendations/referrals: Colonoscopy: not required Mammogram: completed 10/23/2020, due 10/24/2021 Bone Density: completed 03/06/2021 Recommended yearly ophthalmology/optometry visit for glaucoma screening and checkup Recommended yearly dental visit for hygiene and checkup  Vaccinations: Influenza vaccine: today Pneumococcal vaccine: completed 05/25/2013 Tdap vaccine: completed 10/13/2012, due 10/14/2022 Shingles vaccine: completed   Covid-19: 11/27/2020, 05/02/2020, 11/13/2019, 02/13/2019, 01/26/2019  Advanced directives: copy in chart  Conditions/risks identified: none  Next appointment: Follow up in one year for your annual wellness visit    Preventive Care 79 Years and Older, Female Preventive care refers to lifestyle choices and visits with your health care provider that can promote health and wellness. What does preventive care include? A yearly physical exam. This is also called an annual well check. Dental exams once or twice a year. Routine eye exams. Ask your health care provider how often you should have your eyes checked. Personal lifestyle choices, including: Daily care of your teeth and gums. Regular physical activity. Eating a healthy diet. Avoiding tobacco and drug use. Limiting alcohol use. Practicing safe sex. Taking low-dose aspirin every day. Taking vitamin and mineral supplements as recommended by your health care provider. What happens during an annual well check? The services and screenings done by your health care provider during your annual well check will depend on your age, overall health, lifestyle risk factors, and family history of disease. Counseling  Your health care provider may ask you questions about  your: Alcohol use. Tobacco use. Drug use. Emotional well-being. Home and relationship well-being. Sexual activity. Eating habits. History of falls. Memory and ability to understand (cognition). Work and work Statistician. Reproductive health. Screening  You may have the following tests or measurements: Height, weight, and BMI. Blood pressure. Lipid and cholesterol levels. These may be checked every 5 years, or more frequently if you are over 51 years old. Skin check. Lung cancer screening. You may have this screening every year starting at age 74 if you have a 30-pack-year history of smoking and currently smoke or have quit within the past 15 years. Fecal occult blood test (FOBT) of the stool. You may have this test every year starting at age 19. Flexible sigmoidoscopy or colonoscopy. You may have a sigmoidoscopy every 5 years or a colonoscopy every 10 years starting at age 90. Hepatitis C blood test. Hepatitis B blood test. Sexually transmitted disease (STD) testing. Diabetes screening. This is done by checking your blood sugar (glucose) after you have not eaten for a while (fasting). You may have this done every 1-3 years. Bone density scan. This is done to screen for osteoporosis. You may have this done starting at age 35. Mammogram. This may be done every 1-2 years. Talk to your health care provider about how often you should have regular mammograms. Talk with your health care provider about your test results, treatment options, and if necessary, the need for more tests. Vaccines  Your health care provider may recommend certain vaccines, such as: Influenza vaccine. This is recommended every year. Tetanus, diphtheria, and acellular pertussis (Tdap, Td) vaccine. You may need a Td booster every 10 years. Zoster vaccine. You may need this after age 63. Pneumococcal 13-valent conjugate (PCV13) vaccine. One dose is recommended after age 61. Pneumococcal polysaccharide (PPSV23) vaccine.  One dose  is recommended after age 16. Talk to your health care provider about which screenings and vaccines you need and how often you need them. This information is not intended to replace advice given to you by your health care provider. Make sure you discuss any questions you have with your health care provider. Document Released: 01/27/2015 Document Revised: 09/20/2015 Document Reviewed: 11/01/2014 Elsevier Interactive Patient Education  2017 [place] Prevention in the Home Falls can cause injuries. They can happen to people of all ages. There are many things you can do to make your home safe and to help prevent falls. What can I do on the outside of my home? Regularly fix the edges of walkways and driveways and fix any cracks. Remove anything that might make you trip as you walk through a door, such as a raised step or threshold. Trim any bushes or trees on the path to your home. Use bright outdoor lighting. Clear any walking paths of anything that might make someone trip, such as rocks or tools. Regularly check to see if handrails are loose or broken. Make sure that both sides of any steps have handrails. Any raised decks and porches should have guardrails on the edges. Have any leaves, snow, or ice cleared regularly. Use sand or salt on walking paths during winter. Clean up any spills in your garage right away. This includes oil or grease spills. What can I do in the bathroom? Use night lights. Install grab bars by the toilet and in the tub and shower. Do not use towel bars as grab bars. Use non-skid mats or decals in the tub or shower. If you need to sit down in the shower, use a plastic, non-slip stool. Keep the floor dry. Clean up any water that spills on the floor as soon as it happens. Remove soap buildup in the tub or shower regularly. Attach bath mats securely with double-sided non-slip rug tape. Do not have throw rugs and other things on the floor that can make  you trip. What can I do in the bedroom? Use night lights. Make sure that you have a light by your bed that is easy to reach. Do not use any sheets or blankets that are too big for your bed. They should not hang down onto the floor. Have a firm chair that has side arms. You can use this for support while you get dressed. Do not have throw rugs and other things on the floor that can make you trip. What can I do in the kitchen? Clean up any spills right away. Avoid walking on wet floors. Keep items that you use a lot in easy-to-reach places. If you need to reach something above you, use a strong step stool that has a grab bar. Keep electrical cords out of the way. Do not use floor polish or wax that makes floors slippery. If you must use wax, use non-skid floor wax. Do not have throw rugs and other things on the floor that can make you trip. What can I do with my stairs? Do not leave any items on the stairs. Make sure that there are handrails on both sides of the stairs and use them. Fix handrails that are broken or loose. Make sure that handrails are as long as the stairways. Check any carpeting to make sure that it is firmly attached to the stairs. Fix any carpet that is loose or worn. Avoid having throw rugs at the top or bottom of the stairs. If  you do have throw rugs, attach them to the floor with carpet tape. Make sure that you have a light switch at the top of the stairs and the bottom of the stairs. If you do not have them, ask someone to add them for you. What else can I do to help prevent falls? Wear shoes that: Do not have high heels. Have rubber bottoms. Are comfortable and fit you well. Are closed at the toe. Do not wear sandals. If you use a stepladder: Make sure that it is fully opened. Do not climb a closed stepladder. Make sure that both sides of the stepladder are locked into place. Ask someone to hold it for you, if possible. Clearly mark and make sure that you can  see: Any grab bars or handrails. First and last steps. Where the edge of each step is. Use tools that help you move around (mobility aids) if they are needed. These include: Canes. Walkers. Scooters. Crutches. Turn on the lights when you go into a dark area. Replace any light bulbs as soon as they burn out. Set up your furniture so you have a clear path. Avoid moving your furniture around. If any of your floors are uneven, fix them. If there are any pets around you, be aware of where they are. Review your medicines with your doctor. Some medicines can make you feel dizzy. This can increase your chance of falling. Ask your doctor what other things that you can do to help prevent falls. This information is not intended to replace advice given to you by your health care provider. Make sure you discuss any questions you have with your health care provider. Document Released: 10/27/2008 Document Revised: 06/08/2015 Document Reviewed: 02/04/2014 Elsevier Interactive Patient Education  2017 [employer].

## 2021-09-25 NOTE — Progress Notes (Signed)
Subjective:   Margaret Mitchell is a 81 y.o. female who presents for Medicare Annual (Subsequent) preventive examination.  Review of Systems     Cardiac Risk Factors include: advanced age (>82mn, >>11women);hypertension;obesity (BMI >30kg/m2)     Objective:    Today's Vitals   09/25/21 1131  BP: 120/60  Pulse: 68  Temp: 97.9 F (36.6 C)  TempSrc: Oral  SpO2: 98%  Weight: 197 lb 8 oz (89.6 kg)  Height: '5\' 6"'$  (1.676 m)   Body mass index is 31.88 kg/m.     09/25/2021   11:41 AM 07/06/2021    3:13 PM 03/12/2021    1:37 PM 09/05/2020   11:54 AM 11/15/2019   11:23 AM 09/02/2019   10:36 AM 08/23/2019    1:45 PM  Advanced Directives  Does Patient Have a Medical Advance Directive? No No No Yes Yes Yes Yes  Type of AVisual merchandiserof ABriarcliffe AcresLiving will HBeechwoodLiving will   Does patient want to make changes to medical advance directive?      No - Patient declined   Copy of HNilesin Chart?    Yes - validated most recent copy scanned in chart (See row information)  Yes - validated most recent copy scanned in chart (See row information)   Would patient like information on creating a medical advance directive?   No - Patient declined        Current Medications (verified) Outpatient Encounter Medications as of 09/25/2021  Medication Sig   amLODipine-benazepril (LOTREL) 5-20 MG capsule TAKE 1 CAPSULE DAILY   metoprolol succinate (TOPROL-XL) 50 MG 24 hr tablet TAKE 1 TABLET DAILY WITH OR IMMEDIATELY FOLLOWING A MEAL   Multiple Vitamin (MULTIVITAMIN) tablet Take 1 tablet by mouth daily.   No facility-administered encounter medications on file as of 09/25/2021.    Allergies (verified) Other, Prednisone, Seldane [terfenadine], Sulfa antibiotics, Sulfasalazine, Sulfonamide derivatives, and Vioxx [rofecoxib]   History: Past Medical History:  Diagnosis Date   Allergy, unspecified not  elsewhere classified    Anxiety    Arthritis    Cervical dysplasia    prior to hysterectomy   GERD (gastroesophageal reflux disease)    Headache(784.0)    Hypertension    IBS (irritable bowel syndrome)    Osteopenia 12/2015   T score -1.1 FRAX 3.7%/0.4%   Pelvic adhesions    Pelvic pain in female    Sinus infection    Thyroid disease    Parathyroid cyst   Past Surgical History:  Procedure Laterality Date   ABDOMINAL HYSTERECTOMY  1983   TAH/BSO for pelvic pain and adhesions   APPENDECTOMY  1994   BREAST SURGERY     Biopsy-Benign   CATARACT EXTRACTION Bilateral 2013   COLPOSCOPY     FOOT SURGERY Bilateral    PARATHYROID ADENOMA REMOVED  02/1980   ROTATOR CUFF REPAIR     X2   SHOULDER INJECTION  10/2011   TONSILLECTOMY     TUBAL LIGATION  1981   VESICOVAGINAL FISTULA CLOSURE W/ TAH     Family History  Problem Relation Age of Onset   Dementia Mother    Diabetes Mother    Lung cancer Father    Leukemia Brother    Diabetes Maternal Aunt    Hypertension Maternal Grandmother    Heart disease Maternal Grandmother    Diabetes Maternal Grandmother    Stroke Maternal Grandmother    Heart failure  Paternal Grandmother    Social History   Socioeconomic History   Marital status: Widowed    Spouse name: Margaret Mitchell   Number of children: 1   Years of education: Not on file   Highest education level: Bachelor's degree (e.g., BA, AB, BS)  Occupational History   Occupation: retired    Fish farm manager: RETIRED  Tobacco Use   Smoking status: Never   Smokeless tobacco: Never  Vaping Use   Vaping Use: Never used  Substance and Sexual Activity   Alcohol use: Yes    Alcohol/week: 1.0 standard drink of alcohol    Types: 1 Glasses of wine per week    Comment: Rare   Drug use: No   Sexual activity: Yes    Birth control/protection: Surgical    Comment: 1st intercourse 81 yo-Fewer than 5 partners  Other Topics Concern   Not on file  Social History Narrative   Lives with husband.    Retired from working Surveyor, mining at [employer].   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   Right handed   One story home   Social Determinants of Health   Financial Resource Strain: Low Risk  (09/25/2021)   Overall Financial Resource Strain (CARDIA)    Difficulty of Paying Living Expenses: Not hard at all  Food Insecurity: No Food Insecurity (09/25/2021)   Hunger Vital Sign    Worried About Running Out of Food in the Last Year: Never true    Ran Out of Food in the Last Year: Never true  Transportation Needs: No Transportation Needs (09/25/2021)   PRAPARE - Hydrologist (Medical): No    Lack of Transportation (Non-Medical): No  Physical Activity: Sufficiently Active (09/25/2021)   Exercise Vital Sign    Days of Exercise per Week: 5 days    Minutes of Exercise per Session: 30 min  Stress: No Stress Concern Present (09/25/2021)   [name]    Feeling of Stress : Not at all  Social Connections: Moderately Integrated (01/09/2021)   Social Connection and Isolation Panel [NHANES]    Frequency of Communication with Friends and Family: More than three times a week    Frequency of Social Gatherings with Friends and Family: Twice a week    Attends Religious Services: More than 4 times per year    Active Member of [employer] or Organizations: Yes    Attends Archivist Meetings: More than 4 times per year    Marital Status: Widowed    Tobacco Counseling Counseling given: Not Answered   Clinical Intake:  Pre-visit preparation completed: Yes  Pain : No/denies pain     Nutritional Status: BMI > 30  Obese Nutritional Risks: None Diabetes: No  How often do you need to have someone help you when you read instructions, pamphlets, or other written materials from your doctor or pharmacy?: 1 - Never What is the last grade level you completed in school?: college  Diabetic?  no  Interpreter Needed?: No  Information entered by :: NAllen LPN   Activities of Daily Living    09/25/2021   11:43 AM  In your present state of health, do you have any difficulty performing the following activities:  Hearing? 0  Vision? 0  Difficulty concentrating or making decisions? 0  Walking or climbing stairs? 0  Dressing or bathing? 0  Doing errands, shopping? 0  Preparing Food and eating ? N  Using the Toilet? N  In the past six months, have you accidently leaked urine? N  Do you have problems with loss of bowel control? N  Managing your Medications? N  Managing your Finances? N  Housekeeping or managing your Housekeeping? N    Patient Care Team: Eulas Post, MD as PCP - General (Family Medicine) Troy Sine, MD as PCP - Cardiology (Cardiology)  Indicate any recent Medical Services you may have received from other than [facility] providers in the past year (date may be approximate).     Assessment:   This is a routine wellness examination for Moesha.  Hearing/Vision screen Vision Screening - Comments:: Regular eye exams,   Dietary issues and exercise activities discussed: Current Exercise Habits: Home exercise routine, Type of exercise: walking, Time (Minutes): 30, Frequency (Times/Week): 5, Weekly Exercise (Minutes/Week): 150   Goals Addressed             This Visit's Progress    Patient Stated       09/25/2021, wants to lose weight       Depression Screen    09/25/2021   11:43 AM 07/20/2021   10:40 AM 01/10/2021   10:02 AM 09/05/2020   11:53 AM 10/12/2019   11:36 AM 09/02/2019   10:41 AM 06/06/2017    2:13 PM  PHQ 2/9 Scores  PHQ - 2 Score 0 0 0 0 1 0 0  PHQ- 9 Score  1 0  6 0     Fall Risk    09/25/2021   11:42 AM 01/10/2021   10:03 AM 01/09/2021   11:03 AM 09/05/2020   11:55 AM 09/02/2019   10:38 AM  Fall Risk   Falls in the past year? 0 0 0 0 1  Number falls in past yr: 0 0  0 0  Comment     Tripped off the step stool and fell  into the floor  Injury with Fall? 0 0  0 0  Risk for fall due to : Medication side effect   Impaired balance/gait;Impaired vision Medication side effect  Follow up Falls prevention discussed;Education provided;Falls evaluation completed   Falls prevention discussed Falls evaluation completed;Falls prevention discussed    FALL RISK PREVENTION PERTAINING TO THE HOME:  Any stairs in or around the home? No  If so, are there any without handrails? N/a Home free of loose throw rugs in walkways, pet beds, electrical cords, etc? Yes  Adequate lighting in your home to reduce risk of falls? Yes   ASSISTIVE DEVICES UTILIZED TO PREVENT FALLS:  Life alert? Yes  Use of a cane, walker or w/c? No  Grab bars in the bathroom? Yes  Shower chair or bench in shower? No  Elevated toilet seat or a handicapped toilet? No   TIMED UP AND GO:  Was the test performed? Yes .  Length of time to ambulate 10 feet: 5 sec.   Gait steady and fast without use of assistive device  Cognitive Function:        09/25/2021   11:44 AM 09/05/2020   11:56 AM 09/02/2019   10:51 AM  6CIT Screen  What Year? 0 points 0 points 0 points  What month? 0 points 0 points 0 points  What time? 0 points 0 points 0 points  Count back from 20 0 points 0 points 0 points  Months in reverse 0 points 0 points 0 points  Repeat phrase 2 points 0 points 0 points  Total Score 2 points 0 points 0 points  Immunizations Immunization History  Administered Date(s) Administered   Fluad Quad(high Dose 65+) 10/01/2018, 10/12/2019, 10/17/2020, 09/25/2021   Influenza Split 11/13/2010, 11/12/2011   Influenza Whole 10/27/2007, 11/08/2008, 12/08/2009   Influenza, High Dose Seasonal PF 10/09/2013, 10/28/2014, 09/28/2015, 10/07/2016, 10/17/2017   Influenza,inj,Quad PF,6+ Mos 09/30/2012   PFIZER Comirnaty(Gray Top)Covid-19 Tri-Sucrose Vaccine 05/02/2020   PFIZER(Purple Top)SARS-COV-2 Vaccination 01/26/2019, 02/13/2019, 11/13/2019   Pfizer  Covid-19 Vaccine Bivalent Booster 89yr & up 11/27/2020   Pneumococcal Conjugate-13 05/25/2013   Pneumococcal Polysaccharide-23 05/08/2009   Tdap 10/13/2012   Zoster Recombinat (Shingrix) 09/27/2019, 12/31/2019   Zoster, Live 01/15/2011    TDAP status: Up to date  Flu Vaccine status: Completed at today's visit  Pneumococcal vaccine status: Up to date  Covid-19 vaccine status: Completed vaccines  Qualifies for Shingles Vaccine? Yes   Zostavax completed Yes   Shingrix Completed?: Yes  Screening Tests Health Maintenance  Topic Date Due   COVID-19 Vaccine (6 - Pfizer series) 03/27/2021   Hepatitis C Screening  07/21/2022 (Originally 05/16/1958)   TETANUS/TDAP  10/14/2022   Pneumonia Vaccine 81 Years old  Completed   INFLUENZA VACCINE  Completed   DEXA SCAN  Completed   Zoster Vaccines- Shingrix  Completed   HPV VACCINES  Aged Out    Health Maintenance  Health Maintenance Due  Topic Date Due   COVID-19 Vaccine (6 - Pfizer series) 03/27/2021    Colorectal cancer screening: No longer required.   Mammogram status: Completed 10/23/2020. Repeat every year  Bone Density status: Completed 03/06/2021  Lung Cancer Screening: (Low Dose CT Chest recommended if Age 79-80years, 30 pack-year currently smoking OR have quit w/in 15years.) does not qualify.   Lung Cancer Screening Referral: no  Additional Screening:  Hepatitis C Screening: does qualify;   Vision Screening: Recommended annual ophthalmology exams for early detection of glaucoma and other disorders of the eye. Is the patient up to date with their annual eye exam?  Yes  Who is the provider or what is the name of the office in which the patient attends annual eye exams? Can't remember nam If pt is not established with a provider, would they like to be referred to a provider to establish care? No .   Dental Screening: Recommended annual dental exams for proper oral hygiene  Community Resource Referral / Chronic Care  Management: CRR required this visit?  No   CCM required this visit?  No      Plan:     I have personally reviewed and noted the following in the patient's chart:   Medical and social history Use of alcohol, tobacco or illicit drugs  Current medications and supplements including opioid prescriptions. Patient is not currently taking opioid prescriptions. Functional ability and status Nutritional status Physical activity Advanced directives List of other physicians Hospitalizations, surgeries, and ER visits in previous 12 months Vitals Screenings to include cognitive, depression, and falls Referrals and appointments  In addition, I have reviewed and discussed with patient certain preventive protocols, quality metrics, and best practice recommendations. A written personalized care plan for preventive services as well as general preventive health recommendations were provided to patient.     NKellie Simmering LPN   [id number]  Nurse Notes: none

## 2021-10-22 ENCOUNTER — Telehealth (INDEPENDENT_AMBULATORY_CARE_PROVIDER_SITE_OTHER): Payer: Medicare Other | Admitting: Family Medicine

## 2021-10-22 ENCOUNTER — Encounter: Payer: Self-pay | Admitting: Family Medicine

## 2021-10-22 VITALS — Temp 97.0°F | Ht 66.0 in | Wt 197.5 lb

## 2021-10-22 DIAGNOSIS — J069 Acute upper respiratory infection, unspecified: Secondary | ICD-10-CM

## 2021-10-22 NOTE — Progress Notes (Signed)
Patient ID: Margaret Mitchell, female   DOB: 04-Apr-1940, 81 y.o.   MRN: [id number]   Virtual Visit via Video Note  I connected with Margaret Mitchell on 10/22/21 at  2:45 PM EDT by a video enabled telemedicine application and verified that I am speaking with the correct person using two identifiers.  Location patient: home Location provider:work or home office Persons participating in the virtual visit: patient, provider  I discussed the limitations of evaluation and management by telemedicine and the availability of in person appointments. The patient expressed understanding and agreed to proceed.   HPI: Margaret Mitchell is seen with onset about 4 days ago of some dry cough and sinus congestion.  She had some laryngitis but voice is improved today.  Occasional headaches.  Just got back from a train trip to [place] on Saturday.  She had onset of symptoms last Thursday.  No known sick contacts.  No COVID testing done.  No fever.  No dyspnea.  She slept well last night and actually feels quite a bit better today.  Home vital signs include temperature 97 with pulse oximetry 98% and blood pressure 136/75   ROS: See pertinent positives and negatives per HPI.  Past Medical History:  Diagnosis Date   Allergy, unspecified not elsewhere classified    Anxiety    Arthritis    Cervical dysplasia    prior to hysterectomy   GERD (gastroesophageal reflux disease)    Headache(784.0)    Hypertension    IBS (irritable bowel syndrome)    Osteopenia 12/2015   T score -1.1 FRAX 3.7%/0.4%   Pelvic adhesions    Pelvic pain in female    Sinus infection    Thyroid disease    Parathyroid cyst    Past Surgical History:  Procedure Laterality Date   ABDOMINAL HYSTERECTOMY  1983   TAH/BSO for pelvic pain and adhesions   APPENDECTOMY  1994   BREAST SURGERY     Biopsy-Benign   CATARACT EXTRACTION Bilateral 2013   COLPOSCOPY     FOOT SURGERY Bilateral    PARATHYROID ADENOMA REMOVED  02/1980   ROTATOR  CUFF REPAIR     X2   SHOULDER INJECTION  10/2011   TONSILLECTOMY     TUBAL LIGATION  1981   VESICOVAGINAL FISTULA CLOSURE W/ TAH      Family History  Problem Relation Age of Onset   Dementia Mother    Diabetes Mother    Lung cancer Father    Leukemia Brother    Diabetes Maternal Aunt    Hypertension Maternal Grandmother    Heart disease Maternal Grandmother    Diabetes Maternal Grandmother    Stroke Maternal Grandmother    Heart failure Paternal Grandmother     SOCIAL HX: Non-smoker   Current Outpatient Medications:    amLODipine-benazepril (LOTREL) 5-20 MG capsule, TAKE 1 CAPSULE DAILY, Disp: 90 capsule, Rfl: 3   metoprolol succinate (TOPROL-XL) 50 MG 24 hr tablet, TAKE 1 TABLET DAILY WITH OR IMMEDIATELY FOLLOWING A MEAL, Disp: 90 tablet, Rfl: 3   Multiple Vitamin (MULTIVITAMIN) tablet, Take 1 tablet by mouth daily., Disp: , Rfl:   EXAM:  VITALS per patient if applicable:  GENERAL: alert, oriented, appears well and in no acute distress  HEENT: atraumatic, conjunttiva clear, no obvious abnormalities on inspection of external nose and ears  NECK: normal movements of the head and neck  LUNGS: on inspection no signs of respiratory distress, breathing rate appears normal, no obvious gross SOB, gasping or wheezing  CV: no obvious cyanosis  MS: moves all visible extremities without noticeable abnormality  PSYCH/NEURO: pleasant and cooperative, no obvious depression or anxiety, speech and thought processing grossly intact  ASSESSMENT AND PLAN:  Discussed the following assessment and plan:  Probable viral URI with cough.  We did mention consideration for home COVID testing to clarify though she is almost day 5 and does feel somewhat improved today.  Continue plenty fluids and rest.  Follow-up immediately for any fever or worsening symptoms.  No clear indication for antibiotics at this time.  Consider over-the-counter Mucinex 1200 mg twice daily     I discussed the  assessment and treatment plan with the patient. The patient was provided an opportunity to ask questions and all were answered. The patient agreed with the plan and demonstrated an understanding of the instructions.   The patient was advised to call back or seek an in-person evaluation if the symptoms worsen or if the condition fails to improve as anticipated.     Margaret Peat, MD

## 2021-10-24 ENCOUNTER — Telehealth: Payer: Self-pay | Admitting: Family Medicine

## 2021-10-24 MED ORDER — DOXYCYCLINE HYCLATE 100 MG PO CAPS: ORAL_CAPSULE | ORAL | 0 refills | Status: DC

## 2021-10-24 NOTE — Telephone Encounter (Signed)
Pt calling to advise her cough has moved to her chest area, more frequent cough and deeper, non productive at this point. Was advised to call if symptoms worsened.

## 2021-10-24 NOTE — Addendum Note (Signed)
Addended by: Nilda Riggs on: 10/24/2021 01:35 PM   Modules accepted: Orders

## 2021-10-24 NOTE — Telephone Encounter (Signed)
Rx sent 

## 2021-11-16 DIAGNOSIS — Z1231 Encounter for screening mammogram for malignant neoplasm of breast: Secondary | ICD-10-CM | POA: Diagnosis not present

## 2021-11-19 ENCOUNTER — Encounter: Payer: Self-pay | Admitting: Obstetrics and Gynecology

## 2021-12-12 DIAGNOSIS — K1370 Unspecified lesions of oral mucosa: Secondary | ICD-10-CM | POA: Diagnosis not present

## 2021-12-18 ENCOUNTER — Telehealth: Payer: Self-pay | Admitting: Family Medicine

## 2021-12-18 MED ORDER — METOPROLOL SUCCINATE ER 50 MG PO TB24: ORAL_TABLET | ORAL | 2 refills | Status: DC

## 2021-12-18 NOTE — Telephone Encounter (Signed)
Rx sent 

## 2021-12-18 NOTE — Telephone Encounter (Signed)
Pt says she only has 5 pills left of the metoprolol succinate (TOPROL-XL) 50 MG 24 hr tablet   And was not able to get it in October.  Please send to  [facility], [address] AT [address] Phone: [phone]  Fax: [phone]      LVV:  10/22/21

## 2021-12-24 DIAGNOSIS — M25512 Pain in left shoulder: Secondary | ICD-10-CM | POA: Diagnosis not present

## 2021-12-24 DIAGNOSIS — M542 Cervicalgia: Secondary | ICD-10-CM | POA: Diagnosis not present

## 2021-12-25 ENCOUNTER — Ambulatory Visit: Payer: Medicare Other | Attending: Physician Assistant | Admitting: Nurse Practitioner

## 2021-12-25 ENCOUNTER — Encounter: Payer: Self-pay | Admitting: Nurse Practitioner

## 2021-12-25 ENCOUNTER — Ambulatory Visit: Payer: Medicare Other | Admitting: Physician Assistant

## 2021-12-25 VITALS — BP 126/78 | Ht 66.0 in | Wt 193.4 lb

## 2021-12-25 DIAGNOSIS — I340 Nonrheumatic mitral (valve) insufficiency: Secondary | ICD-10-CM | POA: Diagnosis not present

## 2021-12-25 DIAGNOSIS — I5189 Other ill-defined heart diseases: Secondary | ICD-10-CM | POA: Diagnosis not present

## 2021-12-25 DIAGNOSIS — R002 Palpitations: Secondary | ICD-10-CM | POA: Insufficient documentation

## 2021-12-25 DIAGNOSIS — I10 Essential (primary) hypertension: Secondary | ICD-10-CM | POA: Diagnosis not present

## 2021-12-25 NOTE — Progress Notes (Signed)
Office Visit    Patient Name: Margaret Mitchell Date of Encounter: 12/25/2021  Primary Care Provider:  Eulas Post, MD Primary Cardiologist:  Shelva Majestic, MD  Chief Complaint    81 year old female with a history of mild mitral valve regurgitation, palpitations, G2 DD, and hypertension who presents for follow-up related to palpitations.  Past Medical History    Past Medical History:  Diagnosis Date   Allergy, unspecified not elsewhere classified    Anxiety    Arthritis    Cervical dysplasia    prior to hysterectomy   GERD (gastroesophageal reflux disease)    Headache(784.0)    Hypertension    IBS (irritable bowel syndrome)    Osteopenia 12/2015   T score -1.1 FRAX 3.7%/0.4%   Pelvic adhesions    Pelvic pain in female    Sinus infection    Thyroid disease    Parathyroid cyst   Past Surgical History:  Procedure Laterality Date   ABDOMINAL HYSTERECTOMY  1983   TAH/BSO for pelvic pain and adhesions   APPENDECTOMY  1994   BREAST SURGERY     Biopsy-Benign   CATARACT EXTRACTION Bilateral 04/09/2011   COLPOSCOPY     FOOT SURGERY Bilateral    PARATHYROID ADENOMA REMOVED  02/1980   ROTATOR CUFF REPAIR     X2   SHOULDER INJECTION  10/2011   TONSILLECTOMY     TUBAL LIGATION  1981   VESICOVAGINAL FISTULA CLOSURE W/ TAH      Allergies  Allergies  Allergen Reactions   Other     Seldane   Prednisone     REACTION: heart palpatations  In larger doses   Seldane [Terfenadine]     SELDANE   Sulfa Antibiotics Other (See Comments)    REACTION: pt unable to remember REACTION: pt unable to remember   Sulfasalazine Other (See Comments)    REACTION: pt unable to remember   Sulfonamide Derivatives     REACTION: pt unable to remember   Vioxx [Rofecoxib]     REACTION: mouth swelling (VIOXX)    History of Present Illness    81 year old female with the above past medical history including mild mitral valve regurgitation, palpitations, G2 DD, and hypertension .  She  has a longstanding history of hypertension managed per PCP.Lexiscan Myoview in 2016/04/08 revealed normal perfusion without scar or ischemia, EF 60%.    Following the death of her husband in 2018-04-09 she experienced increased anxiety and stress and noted episodes of palpitations, improved with metoprolol.  Echocardiogram in 04/2019 showed EF 60 to 65%, G2 DD, mild mitral valve regurgitation.  She was last seen in the office on 12/25/2020 and was stable from a cardiac standpoint.    She presents today for follow-up.  Since her last visit she has done well from a cardiac standpoint.  She denies any palpitations dizziness, chest pain, dyspnea, edema, PND, orthopnea, weight gain.  Overall, she reports feeling well.  Home Medications    Current Outpatient Medications  Medication Sig Dispense Refill   amLODipine-benazepril (LOTREL) 5-20 MG capsule TAKE 1 CAPSULE DAILY 90 capsule 3   metoprolol succinate (TOPROL-XL) 50 MG 24 hr tablet TAKE 1 TABLET DAILY WITH OR IMMEDIATELY FOLLOWING A MEAL 90 tablet 2   Multiple Vitamin (MULTIVITAMIN) tablet Take 1 tablet by mouth daily.     No current facility-administered medications for this visit.     Review of Systems    She denies chest pain, palpitations, dyspnea, pnd, orthopnea, n, v, dizziness, syncope, edema,  weight gain, or early satiety. All other systems reviewed and are otherwise negative except as noted above.   Physical Exam    VS:  BP 126/78   Ht '5\' 6"'$  (1.676 m)   Wt 193 lb 6.4 oz (87.7 kg)   SpO2 94%   BMI 31.22 kg/m  GEN: Well nourished, well developed, in no acute distress. HEENT: normal. Neck: Supple, no JVD, carotid bruits, or masses. Cardiac: RRR, no murmurs, rubs, or gallops. No clubbing, cyanosis, edema.  Radials/DP/PT 2+ and equal bilaterally.  Respiratory:  Respirations regular and unlabored, clear to auscultation bilaterally. GI: Soft, nontender, nondistended, BS + x 4. MS: no deformity or atrophy. Skin: warm and dry, no rash. Neuro:   Strength and sensation are intact. Psych: Normal affect.  Accessory Clinical Findings    ECG personally reviewed by me today - NSR, 72 bpm - no acute changes.   Lab Results  Component Value Date   WBC 8.8 03/12/2021   HGB 13.4 03/12/2021   HCT 41.1 03/12/2021   MCV 95.1 03/12/2021   PLT 347 03/12/2021   Lab Results  Component Value Date   CREATININE 0.95 03/12/2021   BUN 14 03/12/2021   NA 142 03/12/2021   K 3.4 (L) 03/12/2021   CL 105 03/12/2021   CO2 29 03/12/2021   Lab Results  Component Value Date   ALT 16 01/11/2020   AST 21 01/11/2020   ALKPHOS 89 01/11/2020   BILITOT 0.3 01/11/2020   Lab Results  Component Value Date   CHOL 178 04/08/2019   HDL 72 04/08/2019   LDLCALC 83 04/08/2019   LDLDIRECT 104.8 05/07/2010   TRIG 137 04/08/2019   CHOLHDL 2.5 04/08/2019    Lab Results  Component Value Date   HGBA1C 6.1 05/03/2015    Assessment & Plan   1. Palpitations: Denies any recent palpitations. Continue metoprolol.   2. Mitral valve regurgitation: Mild on most recent echo. Consider repeat echo as clinically indicated.   3. G2 DD: Euvolemic and well compensated on exam.  4. Hypertension: BP well controlled. Continue current antihypertensive regimen.   5. Disposition: Follow-up in 1 year.      Lenna Sciara, NP 12/25/2021, 10:55 AM

## 2021-12-25 NOTE — Patient Instructions (Signed)
Medication Instructions:  Your physician recommends that you continue on your current medications as directed. Please refer to the Current Medication list given to you today.   *If you need a refill on your cardiac medications before your next appointment, please call your pharmacy*   Lab Work: NONE ordered at this time of appointment   If you have labs (blood work) drawn today and your tests are completely normal, you will receive your results only by: MyChart Message (if you have MyChart) OR A paper copy in the mail If you have any lab test that is abnormal or we need to change your treatment, we will call you to review the results.   Testing/Procedures: NONE ordered at this time of appointment     Follow-Up: At [facility], you and your health needs are our priority.  As part of our continuing mission to provide you with exceptional heart care, we have created designated Provider Care Teams.  These Care Teams include your primary Cardiologist (physician) and Advanced Practice Providers (APPs -  Physician Assistants and Nurse Practitioners) who all work together to provide you with the care you need, when you need it.  We recommend signing up for the patient portal called "MyChart".  Sign up information is provided on this After Visit Summary.  MyChart is used to connect with patients for Virtual Visits (Telemedicine).  Patients are able to view lab/test results, encounter notes, upcoming appointments, etc.  Non-urgent messages can be sent to your provider as well.   To learn more about what you can do with MyChart, go to [email or web address].    Your next appointment:   1 year(s)  The format for your next appointment:   In Person  Provider:   Thomas Kelly, MD     Other Instructions   Important Information About Sugar       

## 2021-12-31 ENCOUNTER — Ambulatory Visit: Payer: Medicare Other | Admitting: Neurology

## 2022-01-21 ENCOUNTER — Ambulatory Visit: Payer: Medicare Other | Admitting: Neurology

## 2022-01-22 ENCOUNTER — Ambulatory Visit (INDEPENDENT_AMBULATORY_CARE_PROVIDER_SITE_OTHER): Payer: Medicare Other | Admitting: Family Medicine

## 2022-01-22 ENCOUNTER — Encounter: Payer: Self-pay | Admitting: Family Medicine

## 2022-01-22 VITALS — BP 138/74 | HR 65 | Temp 97.9°F | Ht 66.0 in | Wt 191.2 lb

## 2022-01-22 DIAGNOSIS — Z833 Family history of diabetes mellitus: Secondary | ICD-10-CM | POA: Diagnosis not present

## 2022-01-22 DIAGNOSIS — G629 Polyneuropathy, unspecified: Secondary | ICD-10-CM

## 2022-01-22 DIAGNOSIS — I10 Essential (primary) hypertension: Secondary | ICD-10-CM | POA: Diagnosis not present

## 2022-01-22 DIAGNOSIS — E876 Hypokalemia: Secondary | ICD-10-CM | POA: Diagnosis not present

## 2022-01-22 LAB — BASIC METABOLIC PANEL
BUN: 17 mg/dL (ref 6–23)
CO2: 30 mEq/L (ref 19–32)
Calcium: 9.5 mg/dL (ref 8.4–10.5)
Chloride: 102 mEq/L (ref 96–112)
Creatinine, Ser: 0.97 mg/dL (ref 0.40–1.20)
GFR: 55.51 mL/min — ABNORMAL LOW (ref >60.00)
Glucose, Bld: 86 mg/dL (ref 70–99)
Potassium: 4.6 mEq/L (ref 3.5–5.1)
Sodium: 139 mEq/L (ref 135–145)

## 2022-01-22 NOTE — Progress Notes (Signed)
Established Patient Office Visit  Subjective   Patient ID: Margaret Mitchell, female    DOB: 01-13-1941  Age: 82 y.o. MRN: [id number]  Chief Complaint  Patient presents with   Follow-up    HPI   Margaret Mitchell is seen for medical follow-up.  She has history of hypertension, GERD, restless legs, depression.  Generally doing well.  Increased risk of falls but denies any recent fall.  She has had some dizziness which is worse at night usually worse when rolling to the left side.  This sounds like transient vertigo.  No positional dizziness.  No recent syncope.  No associated nausea.  No acute hearing changes.  No focal weakness.  She has hypertension treated with Lotrel and metoprolol.  Compliant with medications.  No orthostatic symptoms.  She has had history of mild hypokalemia.  Does not take any diuretics.  She recently saw neurology and had multiple labs and requested review of those.  Her immunizations are up-to-date including flu vaccine.  She has strong family history of type 2 diabetes.  She is requesting screening for that.  Denies any significant polyuria or polydipsia.  Past Medical History:  Diagnosis Date   Allergy, unspecified not elsewhere classified    Anxiety    Arthritis    Cervical dysplasia    prior to hysterectomy   GERD (gastroesophageal reflux disease)    Headache(784.0)    Hypertension    IBS (irritable bowel syndrome)    Osteopenia 12/2015   T score -1.1 FRAX 3.7%/0.4%   Pelvic adhesions    Pelvic pain in female    Sinus infection    Thyroid disease    Parathyroid cyst   Past Surgical History:  Procedure Laterality Date   ABDOMINAL HYSTERECTOMY  1983   TAH/BSO for pelvic pain and adhesions   APPENDECTOMY  1994   BREAST SURGERY     Biopsy-Benign   CATARACT EXTRACTION Bilateral 2013   COLPOSCOPY     FOOT SURGERY Bilateral    PARATHYROID ADENOMA REMOVED  02/1980   ROTATOR CUFF REPAIR     X2   SHOULDER INJECTION  10/2011   TONSILLECTOMY     TUBAL  LIGATION  1981   VESICOVAGINAL FISTULA CLOSURE W/ TAH      reports that she has never smoked. She has never used smokeless tobacco. She reports current alcohol use of about 1.0 standard drink of alcohol per week. She reports that she does not use drugs. family history includes Dementia in her mother; Diabetes in her maternal aunt, maternal grandmother, and mother; Heart disease in her maternal grandmother; Heart failure in her paternal grandmother; Hypertension in her maternal grandmother; Leukemia in her brother; Lung cancer in her father; Stroke in her maternal grandmother. Allergies  Allergen Reactions   Other     Seldane   Prednisone     REACTION: heart palpatations  In larger doses   Seldane [Terfenadine]     SELDANE   Sulfa Antibiotics Other (See Comments)    REACTION: pt unable to remember REACTION: pt unable to remember   Sulfasalazine Other (See Comments)    REACTION: pt unable to remember   Sulfonamide Derivatives     REACTION: pt unable to remember   Vioxx [Rofecoxib]     REACTION: mouth swelling (VIOXX)    Review of Systems  Constitutional:  Negative for chills, fever and malaise/fatigue.  Eyes:  Negative for blurred vision.  Respiratory:  Negative for shortness of breath.   Cardiovascular:  Negative for  chest pain.  Neurological:  Positive for dizziness. Negative for tremors, speech change, focal weakness, seizures, weakness and headaches.      Objective:     BP 138/74 (BP Location: Left Arm, Patient Position: Sitting, Cuff Size: Large)   Pulse 65   Temp 97.9 F (36.6 C) (Oral)   Ht 5\' 6"  (1.676 m)   Wt 191 lb 3.2 oz (86.7 kg)   SpO2 (!) 65%   BMI 30.86 kg/m    Physical Exam Vitals reviewed.  Constitutional:      Appearance: She is well-developed.  Eyes:     Pupils: Pupils are equal, round, and reactive to light.  Neck:     Thyroid: No thyromegaly.     Vascular: No JVD.  Cardiovascular:     Rate and Rhythm: Normal rate and regular rhythm.      Heart sounds:     No gallop.  Pulmonary:     Effort: Pulmonary effort is normal. No respiratory distress.     Breath sounds: Normal breath sounds. No wheezing or rales.  Musculoskeletal:     Cervical back: Neck supple.     Right lower leg: No edema.     Left lower leg: No edema.  Neurological:     General: No focal deficit present.     Mental Status: She is alert.     Cranial Nerves: No cranial nerve deficit.     Motor: No weakness.     Coordination: Coordination normal.     Gait: Gait normal.      No results found for any visits on 01/22/22.    The 10-year ASCVD risk score (Arnett DK, et al., 2019) is: 21%    Assessment & Plan:   #1 hypertension stable.  Continue Lotrel and metoprolol at current doses.  Continue low-sodium diet.  #2 history of hypokalemia which has been mild in the past.  No diuretic use.  Recheck basic metabolic panel  #3 intermittent dizziness.  This sounds like vertigo.  Currently asymptomatic.  Consider vestibular rehab for future recurrence if this is lasting more than a couple days or progressive  #4 positive family history of type 2 diabetes.  Checking fasting glucose with BMP above.  Discussed low glycemic diet.  She has already made several positive choices and tries to avoid high glycemic foods in general  No follow-ups on file.    Evelena Peat, MD

## 2022-02-05 ENCOUNTER — Encounter: Payer: Self-pay | Admitting: Family Medicine

## 2022-02-25 ENCOUNTER — Other Ambulatory Visit: Payer: Self-pay | Admitting: Family Medicine

## 2022-03-20 ENCOUNTER — Encounter: Payer: Self-pay | Admitting: Family Medicine

## 2022-05-19 ENCOUNTER — Ambulatory Visit (HOSPITAL_COMMUNITY)
Admission: EM | Admit: 2022-05-19 | Discharge: 2022-05-19 | Disposition: A | Discharge status: Home / Self Care | Payer: Medicare Other | Attending: Emergency Medicine | Admitting: Emergency Medicine

## 2022-05-19 ENCOUNTER — Other Ambulatory Visit: Payer: Self-pay

## 2022-05-19 ENCOUNTER — Encounter (HOSPITAL_COMMUNITY): Payer: Self-pay | Admitting: Emergency Medicine

## 2022-05-19 DIAGNOSIS — J302 Other seasonal allergic rhinitis: Secondary | ICD-10-CM | POA: Diagnosis not present

## 2022-05-19 DIAGNOSIS — J069 Acute upper respiratory infection, unspecified: Secondary | ICD-10-CM

## 2022-05-19 MED ORDER — CETIRIZINE HCL 5 MG PO TABS: 5.0000 mg | ORAL_TABLET | Freq: Every evening | ORAL | 2 refills | Status: DC

## 2022-05-19 MED ORDER — BENZONATATE 100 MG PO CAPS: 100.0000 mg | ORAL_CAPSULE | Freq: Three times a day (TID) | ORAL | 0 refills | Status: DC | PRN

## 2022-05-19 NOTE — ED Triage Notes (Signed)
Cough and congestion since Thursday, also complains of sneezing and runny nose.    Has taken nyquil, cold and flu medicine

## 2022-05-19 NOTE — ED Provider Notes (Signed)
[facility]    CSN: [id number] Arrival date & time: 05/19/22  1006      History   Chief Complaint Chief Complaint  Patient presents with   Cough    HPI Margaret Mitchell is a 82 y.o. female.  3 day history of cough and nasal congestion, runny nose Cough is dry. Also has been sneezing No fever or chills Denies sore throat, shortness of breath, abd pain, NVD No known sick contacts No history of lung problems  Has used nyquil without relief   Past Medical History:  Diagnosis Date   Allergy, unspecified not elsewhere classified    Anxiety    Arthritis    Cervical dysplasia    prior to hysterectomy   GERD (gastroesophageal reflux disease)    Headache(784.0)    Hypertension    IBS (irritable bowel syndrome)    Osteopenia 12/2015   T score -1.1 FRAX 3.7%/0.4%   Pelvic adhesions    Pelvic pain in female    Sinus infection    Thyroid disease    Parathyroid cyst    Patient Active Problem List   Diagnosis Date Noted   Depression, major, single episode, moderate (HCC) 10/21/2017   Chronic constipation 12/01/2014   Muscle cramps 06/15/2013   Obesity (BMI 30-39.9) 05/25/2013   Dyspnea 03/04/2013   Multinodular thyroid 03/04/2013   Meralgia paresthetica of left side 01/01/2013   Chest pain, atypical 11/16/2012   Osteopenia 05/13/2012   Neuropathy 12/30/2011   Restless legs syndrome (RLS) 11/12/2011   Anxiety    Cervical dysplasia    Vertigo 09/06/2011   Herpes zoster 03/20/2011   Pelvic adhesions    Pelvic pain in female    Menorrhagia    HYPERCHOLESTEROLEMIA, BORDERLINE 11/08/2008   GAIT IMBALANCE 05/11/2008   LEG CRAMPS 04/26/2008   HIATAL HERNIA 06/03/2007   Essential hypertension 01/30/2007   GERD 01/30/2007   CONSTIPATION 01/30/2007   IRRITABLE BOWEL SYNDROME 01/30/2007   DEGENERATIVE JOINT DISEASE 01/30/2007   HEADACHE 01/30/2007   ALLERGY 01/30/2007   BENIGN NEOPLASM OTH&UNSPEC SITE DIGESTIVE SYSTEM 04/17/2005    Past Surgical History:   Procedure Laterality Date   ABDOMINAL HYSTERECTOMY  1983   TAH/BSO for pelvic pain and adhesions   APPENDECTOMY  1994   BREAST SURGERY     Biopsy-Benign   CATARACT EXTRACTION Bilateral 2013   COLPOSCOPY     FOOT SURGERY Bilateral    PARATHYROID ADENOMA REMOVED  02/1980   ROTATOR CUFF REPAIR     X2   SHOULDER INJECTION  10/2011   TONSILLECTOMY     TUBAL LIGATION  1981   VESICOVAGINAL FISTULA CLOSURE W/ TAH      OB History     Gravida  1   Para      Term      Preterm      AB  1   Living  0      SAB  1   IAB      Ectopic      Multiple      Live Births               Home Medications    Prior to Admission medications   Medication Sig Start Date End Date Taking? Authorizing Provider  benzonatate (TESSALON) 100 MG capsule Take 1 capsule (100 mg total) by mouth 3 (three) times daily as needed for cough. 05/19/22  Yes Jayant Kriz, [name], PA-C  cetirizine (ZYRTEC) 5 MG tablet Take 1 tablet (5 mg total) by  mouth at bedtime. 05/19/22  Yes Aundraya Dripps, [name], PA-C  amLODipine-benazepril (LOTREL) 5-20 MG capsule TAKE 1 CAPSULE DAILY 09/18/21   Burchette, Elberta Fortis, MD  famotidine (PEPCID) 20 MG tablet Take 20 mg by mouth daily.    [provider]  Magnesium 400 MG TABS Take by mouth.    [provider]  metoprolol succinate (TOPROL-XL) 50 MG 24 hr tablet TAKE 1 TABLET DAILY WITH OR IMMEDIATELY FOLLOWING A MEAL 02/25/22   Burchette, Elberta Fortis, MD  Multiple Vitamin (MULTIVITAMIN) tablet Take 1 tablet by mouth daily.    [provider]    Family History Family History  Problem Relation Age of Onset   Dementia Mother    Diabetes Mother    Lung cancer Father    Leukemia Brother    Diabetes Maternal Aunt    Hypertension Maternal Grandmother    Heart disease Maternal Grandmother    Diabetes Maternal Grandmother    Stroke Maternal Grandmother    Heart failure Paternal Grandmother     Social History Social History   Tobacco Use   Smoking status:  Never   Smokeless tobacco: Never  Vaping Use   Vaping Use: Never used  Substance Use Topics   Alcohol use: Yes    Alcohol/week: 1.0 standard drink of alcohol    Types: 1 Glasses of wine per week    Comment: Rare   Drug use: No     Allergies   Other, Prednisone, Seldane [terfenadine], Sulfa antibiotics, Sulfasalazine, Sulfonamide derivatives, and Vioxx [rofecoxib]   Review of Systems Review of Systems As per HPI  Physical Exam Triage Vital Signs ED Triage Vitals [05/19/22 1023]  Enc Vitals Group     BP      Pulse      Resp      Temp      Temp src      SpO2      Weight      Height      Head Circumference      Peak Flow      Pain Score 5     Pain Loc      Pain Edu?      Excl. in GC?    No data found.  Updated Vital Signs BP 128/80 (BP Location: Left Arm) Comment (BP Location): large cuff  Pulse 85   Temp 99 F (37.2 C) (Oral)   Resp 20   SpO2 96%    Physical Exam Vitals and nursing note reviewed.  Constitutional:      General: She is not in acute distress.    Comments: Well appearing, appears younger than stated age  HENT:     Right Ear: Tympanic membrane and ear canal normal.     Left Ear: Tympanic membrane and ear canal normal.     Nose: Rhinorrhea present.     Mouth/Throat:     Mouth: Mucous membranes are moist.     Pharynx: Oropharynx is clear. No posterior oropharyngeal erythema.  Eyes:     Conjunctiva/sclera: Conjunctivae normal.  Cardiovascular:     Rate and Rhythm: Normal rate and regular rhythm.     Pulses: Normal pulses.     Heart sounds: Normal heart sounds.  Pulmonary:     Effort: Pulmonary effort is normal.     Breath sounds: Normal breath sounds.     Comments: Clear sounds throughout. Infrequent dry cough in clinic Musculoskeletal:        General: Normal range of motion.  Cervical back: Normal range of motion.  Lymphadenopathy:     Cervical: No cervical adenopathy.  Skin:    General: Skin is warm and dry.  Neurological:      Mental Status: She is alert and oriented to person, place, and time.     UC Treatments / Results  Labs (all labs ordered are listed, but only abnormal results are displayed) Labs Reviewed - No data to display  EKG  Radiology No results found.  Procedures Procedures (including critical care time)  Medications Ordered in UC Medications - No data to display  Initial Impression / Assessment and Plan / UC Course  I have reviewed the triage vital signs and the nursing notes.  Pertinent labs & imaging results that were available during my care of the patient were reviewed by me and considered in my medical decision making (see chart for details).  Afebrile, no acute distress, clear lungs Seasonal allergies vs viral etiology Recommend 5 mg zyrtec before bed, tessalon TID prn Discussed side effects of medications Other symptomatic care at home, return precautions  Patient agreeable to plan  Final Clinical Impressions(s) / UC Diagnoses   Final diagnoses:  Seasonal allergies  Viral URI with cough     Discharge Instructions      I recommend taking a once nightly Zyrtec (5 mg).  This is her allergy medicine that can help with all of your symptoms.  Some people may get drowsy with this medicine which is why I recommend taking before bed  The cough pills can be taken 3 times daily as needed.  If these make you drowsy, take 1 pill before bedtime  Continue drinking lots of water!  Hopefully your symptoms improve over the next several days.  If they are persisting or worsen, please return     ED Prescriptions     Medication Sig Dispense Auth. Provider   cetirizine (ZYRTEC) 5 MG tablet Take 1 tablet (5 mg total) by mouth at bedtime. 30 tablet Rashad Obeid, PA-C   benzonatate (TESSALON) 100 MG capsule Take 1 capsule (100 mg total) by mouth 3 (three) times daily as needed for cough. 21 capsule Female Minish, [name], PA-C      PDMP not reviewed this encounter.   Marlow Baars, [place] 05/19/22 1111

## 2022-05-19 NOTE — Discharge Instructions (Addendum)
I recommend taking a once nightly Zyrtec (5 mg).  This is her allergy medicine that can help with all of your symptoms.  Some people may get drowsy with this medicine which is why I recommend taking before bed  The cough pills can be taken 3 times daily as needed.  If these make you drowsy, take 1 pill before bedtime  Continue drinking lots of water!  Hopefully your symptoms improve over the next several days.  If they are persisting or worsen, please return

## 2022-05-27 ENCOUNTER — Other Ambulatory Visit: Payer: Self-pay | Admitting: Family Medicine

## 2022-05-30 ENCOUNTER — Ambulatory Visit (INDEPENDENT_AMBULATORY_CARE_PROVIDER_SITE_OTHER): Payer: Medicare Other | Admitting: Internal Medicine

## 2022-05-30 ENCOUNTER — Encounter: Payer: Self-pay | Admitting: Internal Medicine

## 2022-05-30 VITALS — BP 120/80 | HR 82 | Temp 98.2°F | Wt 195.0 lb

## 2022-05-30 DIAGNOSIS — J302 Other seasonal allergic rhinitis: Secondary | ICD-10-CM

## 2022-05-30 DIAGNOSIS — Z09 Encounter for follow-up examination after completed treatment for conditions other than malignant neoplasm: Secondary | ICD-10-CM

## 2022-05-30 MED ORDER — FLUTICASONE PROPIONATE 50 MCG/ACT NA SUSP
2.0000 | Freq: Every day | NASAL | 6 refills | Status: DC
Start: 2022-05-30 — End: 2022-07-31

## 2022-05-30 NOTE — Progress Notes (Signed)
Established Patient Office Visit     CC/Reason for Visit: ED follow-up  HPI: Margaret Mitchell is a 82 y.o. female who is coming in today for the above mentioned reasons.  For the past 7 to 10 days she has been having watery, itchy eyes, nasal congestion, rhinorrhea, headaches.  She went to the ED and was diagnosed with seasonal allergies and placed on cetirizine.  She has not yet noted any significant improvement.   Past Medical/Surgical History: Past Medical History:  Diagnosis Date   Allergy, unspecified not elsewhere classified    Anxiety    Arthritis    Cervical dysplasia    prior to hysterectomy   GERD (gastroesophageal reflux disease)    Headache(784.0)    Hypertension    IBS (irritable bowel syndrome)    Osteopenia 12/2015   T score -1.1 FRAX 3.7%/0.4%   Pelvic adhesions    Pelvic pain in female    Sinus infection    Thyroid disease    Parathyroid cyst    Past Surgical History:  Procedure Laterality Date   ABDOMINAL HYSTERECTOMY  1983   TAH/BSO for pelvic pain and adhesions   APPENDECTOMY  1994   BREAST SURGERY     Biopsy-Benign   CATARACT EXTRACTION Bilateral 2013   COLPOSCOPY     FOOT SURGERY Bilateral    PARATHYROID ADENOMA REMOVED  02/1980   ROTATOR CUFF REPAIR     X2   SHOULDER INJECTION  10/2011   TONSILLECTOMY     TUBAL LIGATION  1981   VESICOVAGINAL FISTULA CLOSURE W/ TAH      Social History:  reports that she has never smoked. She has never used smokeless tobacco. She reports current alcohol use of about 1.0 standard drink of alcohol per week. She reports that she does not use drugs.  Allergies: Allergies  Allergen Reactions   Other     Seldane   Prednisone     REACTION: heart palpatations  In larger doses   Seldane [Terfenadine]     SELDANE   Sulfa Antibiotics Other (See Comments)    REACTION: pt unable to remember REACTION: pt unable to remember   Sulfasalazine Other (See Comments)    REACTION: pt unable to remember    Sulfonamide Derivatives     REACTION: pt unable to remember   Vioxx [Rofecoxib]     REACTION: mouth swelling (VIOXX)    Family History:  Family History  Problem Relation Age of Onset   Dementia Mother    Diabetes Mother    Lung cancer Father    Leukemia Brother    Diabetes Maternal Aunt    Hypertension Maternal Grandmother    Heart disease Maternal Grandmother    Diabetes Maternal Grandmother    Stroke Maternal Grandmother    Heart failure Paternal Grandmother      Current Outpatient Medications:    amLODipine-benazepril (LOTREL) 5-20 MG capsule, TAKE 1 CAPSULE DAILY, Disp: 90 capsule, Rfl: 3   cetirizine (ZYRTEC) 5 MG tablet, Take 1 tablet (5 mg total) by mouth at bedtime., Disp: 30 tablet, Rfl: 2   famotidine (PEPCID) 20 MG tablet, Take 20 mg by mouth daily., Disp: , Rfl:    fluticasone (FLONASE) 50 MCG/ACT nasal spray, Place 2 sprays into both nostrils daily., Disp: 16 g, Rfl: 6   Magnesium 400 MG TABS, Take by mouth., Disp: , Rfl:    metoprolol succinate (TOPROL-XL) 50 MG 24 hr tablet, TAKE 1 TABLET DAILY WITH OR IMMEDIATELY FOLLOWING A MEAL,  Disp: 90 tablet, Rfl: 2   Multiple Vitamin (MULTIVITAMIN) tablet, Take 1 tablet by mouth daily., Disp: , Rfl:   Review of Systems:  Negative unless indicated in HPI.   Physical Exam: Vitals:   05/30/22 1025  BP: 120/80  Pulse: 82  Temp: 98.2 F (36.8 C)  TempSrc: Oral  SpO2: 99%  Weight: 195 lb (88.5 kg)    Body mass index is 31.47 kg/m.   Physical Exam Vitals reviewed.  Constitutional:      Appearance: Normal appearance.  HENT:     Right Ear: Tympanic membrane, ear canal and external ear normal.     Left Ear: Tympanic membrane, ear canal and external ear normal.     Mouth/Throat:     Mouth: Mucous membranes are moist.     Pharynx: Posterior oropharyngeal erythema present.  Eyes:     Conjunctiva/sclera: Conjunctivae normal.     Pupils: Pupils are equal, round, and reactive to light.  Cardiovascular:     Rate  and Rhythm: Normal rate and regular rhythm.  Pulmonary:     Effort: Pulmonary effort is normal.     Breath sounds: Normal breath sounds.  Neurological:     Mental Status: She is alert.      Impression and Plan:  Hospital discharge follow-up  Seasonal allergies -     Fluticasone Propionate; Place 2 sprays into both nostrils daily.  Dispense: 16 g; Refill: 6   -ED charts have been reviewed in detail. -I agree with diagnosis of seasonal allergies and have advised that she continue daily cetirizine and add Flonase.  Time spent:22 minutes reviewing chart, interviewing and examining patient and formulating plan of care.     Chaya Jan, MD [facility]

## 2022-06-26 ENCOUNTER — Encounter: Payer: Self-pay | Admitting: Family Medicine

## 2022-06-26 ENCOUNTER — Ambulatory Visit (INDEPENDENT_AMBULATORY_CARE_PROVIDER_SITE_OTHER): Payer: Medicare Other | Admitting: Family Medicine

## 2022-06-26 VITALS — BP 126/68 | HR 70 | Temp 97.8°F | Ht 66.0 in | Wt 198.0 lb

## 2022-06-26 DIAGNOSIS — Z7189 Other specified counseling: Secondary | ICD-10-CM

## 2022-06-26 DIAGNOSIS — R252 Cramp and spasm: Secondary | ICD-10-CM

## 2022-06-26 DIAGNOSIS — N1831 Chronic kidney disease, stage 3a: Secondary | ICD-10-CM | POA: Diagnosis not present

## 2022-06-26 DIAGNOSIS — I10 Essential (primary) hypertension: Secondary | ICD-10-CM

## 2022-06-26 NOTE — Progress Notes (Signed)
Established Patient Office Visit  Subjective   Patient ID: CHENEL ROTHSCHILD, female    DOB: September 08, 1940  Age: 82 y.o. MRN: [id number]  Chief Complaint  Patient presents with   Medical Management of Chronic Issues    6 months f/u   cramps    Pt c/o leg cramps on both legs and hands. Night and day. Ongoing since last year. Take magnesgium. Taking "leg cramps buster" capsules and magnesium. Helps some in the beginning. Stop working.     HPI   Margaret Mitchell is seen for medical follow-up.  She has history of hypertension, GERD, IBS, degenerative arthritis involving multiple joints, chronic history of frequent muscle cramps.  She has taken magnesium supplements in the past without much benefit.  She stays well-hydrated.  Has also taken vitamin supplement with B12 and B6 without improvement.  Has not tried any turmeric.  She had labs per neurology several months ago.  Her GFR was 55 and she had questions regarding that.  Overall her GFR has declined some over the past decade but relatively stable recently.  She does have hypertension which is controlled with Lotrel and metoprolol.  Her blood pressures at home have been very well-controlled.  No peripheral edema.  No nonsteroidal use.  She walks some daily for exercise usually 1 to 2 miles.  Does feel off balance frequently but does mostly walking inside.  Denies recent fall.  She had some discussion regarding advanced directives.  Sounds like she has probably living will but she was not certain.  Past Medical History:  Diagnosis Date   Allergy, unspecified not elsewhere classified    Anxiety    Arthritis    Cervical dysplasia    prior to hysterectomy   GERD (gastroesophageal reflux disease)    Headache(784.0)    Hypertension    IBS (irritable bowel syndrome)    Osteopenia 12/2015   T score -1.1 FRAX 3.7%/0.4%   Pelvic adhesions    Pelvic pain in female    Sinus infection    Thyroid disease    Parathyroid cyst   Past Surgical History:   Procedure Laterality Date   ABDOMINAL HYSTERECTOMY  1983   TAH/BSO for pelvic pain and adhesions   APPENDECTOMY  1994   BREAST SURGERY     Biopsy-Benign   CATARACT EXTRACTION Bilateral 2013   COLPOSCOPY     FOOT SURGERY Bilateral    PARATHYROID ADENOMA REMOVED  02/1980   ROTATOR CUFF REPAIR     X2   SHOULDER INJECTION  10/2011   TONSILLECTOMY     TUBAL LIGATION  1981   VESICOVAGINAL FISTULA CLOSURE W/ TAH      reports that she has never smoked. She has never used smokeless tobacco. She reports current alcohol use of about 1.0 standard drink of alcohol per week. She reports that she does not use drugs. family history includes Dementia in her mother; Diabetes in her maternal aunt, maternal grandmother, and mother; Heart disease in her maternal grandmother; Heart failure in her paternal grandmother; Hypertension in her maternal grandmother; Leukemia in her brother; Lung cancer in her father; Stroke in her maternal grandmother. Allergies  Allergen Reactions   Other     Seldane   Prednisone     REACTION: heart palpatations  In larger doses   Seldane [Terfenadine]     SELDANE   Sulfa Antibiotics Other (See Comments)    REACTION: pt unable to remember REACTION: pt unable to remember   Sulfasalazine Other (See Comments)  REACTION: pt unable to remember   Sulfonamide Derivatives     REACTION: pt unable to remember   Vioxx [Rofecoxib]     REACTION: mouth swelling (VIOXX)    Review of Systems  Constitutional:  Negative for malaise/fatigue.  Eyes:  Negative for blurred vision.  Respiratory:  Negative for shortness of breath.   Cardiovascular:  Negative for chest pain.  Neurological:  Negative for dizziness, weakness and headaches.      Objective:     BP 126/68 (BP Location: Left Arm, Patient Position: Sitting, Cuff Size: Large)   Pulse 70   Temp 97.8 F (36.6 C) (Oral)   Ht 5\' 6"  (1.676 m)   Wt 198 lb (89.8 kg)   SpO2 98%   BMI 31.96 kg/m  BP Readings from Last 3  Encounters:  06/26/22 126/68  05/30/22 120/80  05/19/22 128/80   Wt Readings from Last 3 Encounters:  06/26/22 198 lb (89.8 kg)  05/30/22 195 lb (88.5 kg)  01/22/22 191 lb 3.2 oz (86.7 kg)      Physical Exam Vitals reviewed.  Constitutional:      Appearance: She is well-developed.  Eyes:     Pupils: Pupils are equal, round, and reactive to light.  Neck:     Thyroid: No thyromegaly.     Vascular: No JVD.  Cardiovascular:     Rate and Rhythm: Normal rate and regular rhythm.     Heart sounds:     No gallop.  Pulmonary:     Effort: Pulmonary effort is normal. No respiratory distress.     Breath sounds: Normal breath sounds. No wheezing or rales.  Musculoskeletal:     Cervical back: Neck supple.     Right lower leg: No edema.     Left lower leg: No edema.  Neurological:     Mental Status: She is alert.      No results found for any visits on 06/26/22.  Last CBC Lab Results  Component Value Date   WBC 8.8 03/12/2021   HGB 13.4 03/12/2021   HCT 41.1 03/12/2021   MCV 95.1 03/12/2021   MCH 31.0 03/12/2021   RDW 12.3 03/12/2021   PLT 347 03/12/2021   Last metabolic panel Lab Results  Component Value Date   GLUCOSE 86 01/22/2022   NA 139 01/22/2022   K 4.6 01/22/2022   CL 102 01/22/2022   CO2 30 01/22/2022   BUN 17 01/22/2022   CREATININE 0.97 01/22/2022   GFRNONAA >60 03/12/2021   CALCIUM 9.5 01/22/2022   PHOS 4.0 06/16/2013   PROT 7.5 07/03/2021   ALBUMIN 4.2 01/11/2020   LABGLOB 2.6 04/08/2019   AGRATIO 1.7 04/08/2019   BILITOT 0.3 01/11/2020   ALKPHOS 89 01/11/2020   AST 21 01/11/2020   ALT 16 01/11/2020   ANIONGAP 8 03/12/2021   Last lipids Lab Results  Component Value Date   CHOL 178 04/08/2019   HDL 72 04/08/2019   LDLCALC 83 04/08/2019   LDLDIRECT 104.8 05/07/2010   TRIG 137 04/08/2019   CHOLHDL 2.5 04/08/2019   Last hemoglobin A1c Lab Results  Component Value Date   HGBA1C 6.1 05/03/2015      The ASCVD Risk score (Arnett DK, et  al., 2019) failed to calculate for the following reasons:   The 2019 ASCVD risk score is only valid for ages 44 to 5    Assessment & Plan:   #1 hypertension stable and at goal on combination therapy with Lotrel and metoprolol.  Continue current doses.  Continue to watch sodium intake.  Continue regular walking  #2 history of chronic frequent muscle cramps.  She appears to be getting adequate hydration.  She has tried vitamin supplements with B6 without improvement.  Has tried magnesium without much improvement.  She will consider trial of turmeric  #3 chronic kidney disease stage IIIa with recent GFR 55.  Discussed chronic kidney disease.  Discussed importance of good blood pressure control and avoidance of nonsteroidals and staying well-hydrated Recheck kidney function at 87-month follow-up  #4 advanced directives.  Handout given.  We discussed advanced directives including living will, DNR, and MOST form  Evelena Peat, MD

## 2022-07-25 ENCOUNTER — Other Ambulatory Visit: Payer: Self-pay

## 2022-07-26 MED ORDER — CETIRIZINE HCL 5 MG PO TABS: 5.0000 mg | ORAL_TABLET | Freq: Every evening | ORAL | 2 refills | Status: DC

## 2022-07-31 ENCOUNTER — Ambulatory Visit (INDEPENDENT_AMBULATORY_CARE_PROVIDER_SITE_OTHER): Payer: Medicare Other | Admitting: Podiatry

## 2022-07-31 ENCOUNTER — Other Ambulatory Visit: Payer: Self-pay

## 2022-07-31 DIAGNOSIS — R200 Anesthesia of skin: Secondary | ICD-10-CM | POA: Diagnosis not present

## 2022-07-31 DIAGNOSIS — J302 Other seasonal allergic rhinitis: Secondary | ICD-10-CM

## 2022-07-31 DIAGNOSIS — Z8739 Personal history of other diseases of the musculoskeletal system and connective tissue: Secondary | ICD-10-CM

## 2022-07-31 DIAGNOSIS — R202 Paresthesia of skin: Secondary | ICD-10-CM | POA: Diagnosis not present

## 2022-07-31 NOTE — Progress Notes (Signed)
Subjective:  Patient ID: Margaret Mitchell, female    DOB: Dec 27, 1940,  MRN: [id number]  Chief Complaint  Patient presents with   Toe Pain    Pain in the great toes at night feels like pins sticking her     82 y.o. female presents with the above complaint.  Patient presents with complaint of bilateral numbness tingling to the big toe.  She states it comes out of nowhere and has progressive gotten worse she wanted to get it evaluated she has not seen and was prior to seeing me she is noticing more at nighttime.  Feels like the pins are sticking her.  She has a history of low back..  She has not seen anyone else prior to seeing me for that.   Review of Systems: Negative except as noted in the HPI. Denies N/V/F/Ch.  Past Medical History:  Diagnosis Date   Allergy, unspecified not elsewhere classified    Anxiety    Arthritis    Cervical dysplasia    prior to hysterectomy   GERD (gastroesophageal reflux disease)    Headache(784.0)    Hypertension    IBS (irritable bowel syndrome)    Osteopenia 12/2015   T score -1.1 FRAX 3.7%/0.4%   Pelvic adhesions    Pelvic pain in female    Sinus infection    Thyroid disease    Parathyroid cyst    Current Outpatient Medications:    amLODipine-benazepril (LOTREL) 5-20 MG capsule, TAKE 1 CAPSULE DAILY, Disp: 90 capsule, Rfl: 3   cetirizine (ZYRTEC) 5 MG tablet, Take 1 tablet (5 mg total) by mouth at bedtime., Disp: 30 tablet, Rfl: 2   famotidine (PEPCID) 20 MG tablet, Take 20 mg by mouth daily., Disp: , Rfl:    fluticasone (FLONASE) 50 MCG/ACT nasal spray, Place 2 sprays into both nostrils daily., Disp: 48 g, Rfl: 3   Magnesium 400 MG TABS, Take by mouth., Disp: , Rfl:    metoprolol succinate (TOPROL-XL) 50 MG 24 hr tablet, TAKE 1 TABLET DAILY WITH OR IMMEDIATELY FOLLOWING A MEAL, Disp: 90 tablet, Rfl: 2   Multiple Vitamin (MULTIVITAMIN) tablet, Take 1 tablet by mouth daily., Disp: , Rfl:   Social History   Tobacco Use  Smoking Status Never   Smokeless Tobacco Never    Allergies  Allergen Reactions   Other     Seldane   Prednisone     REACTION: heart palpatations  In larger doses   Seldane [Terfenadine]     SELDANE   Sulfa Antibiotics Other (See Comments)    REACTION: pt unable to remember REACTION: pt unable to remember   Sulfasalazine Other (See Comments)    REACTION: pt unable to remember   Sulfonamide Derivatives     REACTION: pt unable to remember   Vioxx [Rofecoxib]     REACTION: mouth swelling (VIOXX)   Objective:  There were no vitals filed for this visit. There is no height or weight on file to calculate BMI. Constitutional Well developed. Well nourished.  Vascular Dorsalis pedis pulses palpable bilaterally. Posterior tibial pulses palpable bilaterally. Capillary refill normal to all digits.  No cyanosis or clubbing noted. Pedal hair growth normal.  Neurologic Normal speech. Oriented to person, place, and time. Epicritic sensation to light touch grossly present bilaterally.  Negative Tinel's sign at the tarsal tunnel.  Dermatologic Nails well groomed and normal in appearance. No open wounds. No skin lesions.  Orthopedic: Manual muscle strength 5 out of 5.  No weakness in the metatarsophalangeal joint of  the first noted.   Radiographs: None Assessment:   1. Numbness and tingling of both feet   2. History of low back pain    Plan:  Patient was evaluated and treated and all questions answered.  Bilateral numbness tingling secondary history of low back pain -All questions or concerns were discussed with the patient extensive detail -Given the amount of pain that she is experiencing she will benefit from seeing a spinal specialist that she may be experiencing nerve compression syndrome from lower back.  She does not have any history of Tinel's sign for tarsal tunnel or common peroneal nerve compression. -I discussed shoe gear modification as well.

## 2022-08-01 MED ORDER — FLUTICASONE PROPIONATE 50 MCG/ACT NA SUSP
2.0000 | Freq: Every day | NASAL | 3 refills | Status: DC
Start: 2022-08-01 — End: 2023-09-08

## 2022-08-01 NOTE — Telephone Encounter (Signed)
Rx done. 

## 2022-08-12 DIAGNOSIS — Z961 Presence of intraocular lens: Secondary | ICD-10-CM | POA: Diagnosis not present

## 2022-08-12 DIAGNOSIS — H31091 Other chorioretinal scars, right eye: Secondary | ICD-10-CM | POA: Diagnosis not present

## 2022-08-12 DIAGNOSIS — H35033 Hypertensive retinopathy, bilateral: Secondary | ICD-10-CM | POA: Diagnosis not present

## 2022-08-12 DIAGNOSIS — H04123 Dry eye syndrome of bilateral lacrimal glands: Secondary | ICD-10-CM | POA: Diagnosis not present

## 2022-08-12 DIAGNOSIS — H26493 Other secondary cataract, bilateral: Secondary | ICD-10-CM | POA: Diagnosis not present

## 2022-08-22 ENCOUNTER — Encounter: Payer: Self-pay | Admitting: Family Medicine

## 2022-08-29 ENCOUNTER — Encounter (INDEPENDENT_AMBULATORY_CARE_PROVIDER_SITE_OTHER): Payer: Self-pay

## 2022-09-29 ENCOUNTER — Other Ambulatory Visit: Payer: Self-pay | Admitting: Family Medicine

## 2022-09-29 DIAGNOSIS — I10 Essential (primary) hypertension: Secondary | ICD-10-CM

## 2022-10-04 ENCOUNTER — Ambulatory Visit (INDEPENDENT_AMBULATORY_CARE_PROVIDER_SITE_OTHER): Payer: Medicare Other

## 2022-10-04 VITALS — BP 120/62 | HR 67 | Temp 98.1°F | Ht 66.0 in | Wt 199.0 lb

## 2022-10-04 DIAGNOSIS — Z0000 Encounter for general adult medical examination without abnormal findings: Secondary | ICD-10-CM | POA: Diagnosis not present

## 2022-10-04 DIAGNOSIS — Z23 Encounter for immunization: Secondary | ICD-10-CM | POA: Diagnosis not present

## 2022-10-04 NOTE — Progress Notes (Signed)
Subjective:   Margaret Mitchell is a 82 y.o. female who presents for Medicare Annual (Subsequent) preventive examination.  Visit Complete: In person  Patient Medicare AWV questionnaire was completed by the patient on 09/30/22; I have confirmed that all information answered by patient is correct and no changes since this date.  Cardiac Risk Factors include: advanced age (>49men, >56 women);hypertension     Objective:    Today's Vitals   10/04/22 0929  BP: 120/62  Pulse: 67  Temp: 98.1 F (36.7 C)  TempSrc: Oral  SpO2: 98%  Weight: 199 lb (90.3 kg)  Height: 5\' 6"  (1.676 m)   Body mass index is 32.12 kg/m.     10/04/2022   10:01 AM 09/25/2021   11:41 AM 07/06/2021    3:13 PM 03/12/2021    1:37 PM 09/05/2020   11:54 AM 11/15/2019   11:23 AM 09/02/2019   10:36 AM  Advanced Directives  Does Patient Have a Medical Advance Directive? Yes No No No Yes Yes Yes  Type of Estate agent of [place];Living will    Healthcare Power of [employer] of [place];Living will Healthcare Power of [place];Living will  Does patient want to make changes to medical advance directive? No - Patient declined      No - Patient declined  Copy of Healthcare Power of Attorney in Chart? Yes - validated most recent copy scanned in chart (See row information)    Yes - validated most recent copy scanned in chart (See row information)  Yes - validated most recent copy scanned in chart (See row information)  Would patient like information on creating a medical advance directive?    No - Patient declined       Current Medications (verified) Outpatient Encounter Medications as of 10/04/2022  Medication Sig   amLODipine-benazepril (LOTREL) 5-20 MG capsule TAKE 1 CAPSULE DAILY   cetirizine (ZYRTEC) 5 MG tablet Take 1 tablet (5 mg total) by mouth at bedtime.   famotidine (PEPCID) 20 MG tablet Take 20 mg by mouth daily.   fluticasone (FLONASE) 50 MCG/ACT nasal spray Place 2 sprays  into both nostrils daily.   Magnesium 400 MG TABS Take by mouth.   metoprolol succinate (TOPROL-XL) 50 MG 24 hr tablet TAKE 1 TABLET DAILY WITH OR IMMEDIATELY FOLLOWING A MEAL   Multiple Vitamin (MULTIVITAMIN) tablet Take 1 tablet by mouth daily.   No facility-administered encounter medications on file as of 10/04/2022.    Allergies (verified) Other, Prednisone, Seldane [terfenadine], Sulfa antibiotics, Sulfasalazine, Sulfonamide derivatives, and Vioxx [rofecoxib]   History: Past Medical History:  Diagnosis Date   Allergy, unspecified not elsewhere classified    Anxiety    Arthritis    Cervical dysplasia    prior to hysterectomy   GERD (gastroesophageal reflux disease)    Headache(784.0)    Hypertension    IBS (irritable bowel syndrome)    Osteopenia 12/2015   T score -1.1 FRAX 3.7%/0.4%   Pelvic adhesions    Pelvic pain in female    Sinus infection    Thyroid disease    Parathyroid cyst   Past Surgical History:  Procedure Laterality Date   ABDOMINAL HYSTERECTOMY  1983   TAH/BSO for pelvic pain and adhesions   APPENDECTOMY  1994   BREAST SURGERY     Biopsy-Benign   CATARACT EXTRACTION Bilateral 2013   COLPOSCOPY     FOOT SURGERY Bilateral    PARATHYROID ADENOMA REMOVED  02/1980   ROTATOR CUFF REPAIR  X2   SHOULDER INJECTION  10/2011   TONSILLECTOMY     TUBAL LIGATION  1981   VESICOVAGINAL FISTULA CLOSURE W/ TAH     Family History  Problem Relation Age of Onset   Dementia Mother    Diabetes Mother    Lung cancer Father    Leukemia Brother    Diabetes Maternal Aunt    Hypertension Maternal Grandmother    Heart disease Maternal Grandmother    Diabetes Maternal Grandmother    Stroke Maternal Grandmother    Heart failure Paternal Grandmother    Social History   Socioeconomic History   Marital status: Widowed    Spouse name: Charlann Noss   Number of children: 1   Years of education: Not on file   Highest education level: Bachelor's degree (e.g., BA, AB,  BS)  Occupational History   Occupation: retired    Associate Professor: RETIRED  Tobacco Use   Smoking status: Never   Smokeless tobacco: Never  Vaping Use   Vaping status: Never Used  Substance and Sexual Activity   Alcohol use: Yes    Alcohol/week: 1.0 standard drink of alcohol    Types: 1 Glasses of wine per week    Comment: Rare   Drug use: No   Sexual activity: Yes    Birth control/protection: Surgical    Comment: 1st intercourse 82 yo-Fewer than 5 partners  Other Topics Concern   Not on file  Social History Narrative   Lives with husband.   Retired from working Barrister's clerk at [employer]   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   Right handed   One story home   Social Determinants of Health   Financial Resource Strain: Low Risk  (09/30/2022)   Overall Financial Resource Strain (CARDIA)    Difficulty of Paying Living Expenses: Not hard at all  Food Insecurity: No Food Insecurity (09/30/2022)   Hunger Vital Sign    Worried About Running Out of Food in the Last Year: Never true    Ran Out of Food in the Last Year: Never true  Transportation Needs: No Transportation Needs (09/30/2022)   PRAPARE - Administrator, Civil Service (Medical): No    Lack of Transportation (Non-Medical): No  Physical Activity: Insufficiently Active (09/30/2022)   Exercise Vital Sign    Days of Exercise per Week: 3 days    Minutes of Exercise per Session: 20 min  Stress: No Stress Concern Present (09/30/2022)   [employer] of Occupational Health - Occupational Stress Questionnaire    Feeling of Stress : Not at all  Social Connections: Moderately Integrated (09/30/2022)   Social Connection and Isolation Panel [NHANES]    Frequency of Communication with Friends and Family: More than three times a week    Frequency of Social Gatherings with Friends and Family: Twice a week    Attends Religious Services: More than 4 times per year    Active Member of [employer] or Organizations: Yes     Attends Banker Meetings: 1 to 4 times per year    Marital Status: Widowed    Tobacco Counseling Counseling given: Not Answered   Clinical Intake:  Pre-visit preparation completed: Yes  Pain : No/denies pain     BMI - recorded: 32.12 Nutritional Status: BMI > 30  Obese Nutritional Risks: None Diabetes: No  How often do you need to have someone help you when you read instructions, pamphlets, or other written materials from your doctor or pharmacy?: 1 -  Never  Interpreter Needed?: No  Information entered by :: Theresa Mulligan LPN   Activities of Daily Living    09/30/2022   12:39 PM  In your present state of health, do you have any difficulty performing the following activities:  Hearing? 0  Vision? 0  Difficulty concentrating or making decisions? 0  Walking or climbing stairs? 0  Dressing or bathing? 0  Doing errands, shopping? 0  Preparing Food and eating ? N  Using the Toilet? N  In the past six months, have you accidently leaked urine? N  Do you have problems with loss of bowel control? N  Managing your Medications? N  Managing your Finances? N  Housekeeping or managing your Housekeeping? N    Patient Care Team: Kristian Covey, MD as PCP - General (Family Medicine) Lennette Bihari, MD as PCP - Cardiology (Cardiology)  Indicate any recent Medical Services you may have received from other than [facility] providers in the past year (date may be approximate).     Assessment:   This is a routine wellness examination for Ruhani.  Hearing/Vision screen Hearing Screening - Comments:: Denies hearing difficulties   Vision Screening - Comments:: Wears rx glasses - up to date with routine eye exams with  Dr Hazle Quant   Goals Addressed               This Visit's Progress     Patient Stated (pt-stated)        Lose weight.       Depression Screen    10/04/2022    9:36 AM 05/30/2022   10:33 AM 01/22/2022    9:25 AM 09/25/2021   11:43 AM 07/20/2021    10:40 AM 01/10/2021   10:02 AM 09/05/2020   11:53 AM  PHQ 2/9 Scores  PHQ - 2 Score 0 0 0 0 0 0 0  PHQ- 9 Score  0 0  1 0     Fall Risk    09/30/2022   12:39 PM 05/30/2022   10:33 AM 01/19/2022    9:00 PM 09/25/2021   11:42 AM 01/10/2021   10:03 AM  Fall Risk   Falls in the past year? 0 0 0 0 0  Number falls in past yr: 0 0  0 0  Injury with Fall? 0 0  0 0  Risk for fall due to : No Fall Risks   Medication side effect   Follow up Falls prevention discussed Falls evaluation completed  Falls prevention discussed;Education provided;Falls evaluation completed     MEDICARE RISK AT HOME: Medicare Risk at Home Any stairs in or around the home?: (P) No Home free of loose throw rugs in walkways, pet beds, electrical cords, etc?: (P) Yes Adequate lighting in your home to reduce risk of falls?: (P) Yes Life alert?: (P) Yes Use of a cane, walker or w/c?: (P) No Grab bars in the bathroom?: (P) Yes Shower chair or bench in shower?: (P) No Elevated toilet seat or a handicapped toilet?: (P) Yes  TIMED UP AND GO:  Was the test performed?  Yes  Length of time to ambulate 10 feet: 10 sec Gait steady and fast without use of assistive device    Cognitive Function:    06/06/2017    2:17 PM 02/07/2016    4:37 PM  MMSE - Mini Mental State Exam  Not completed: -- --        10/04/2022   10:02 AM 09/25/2021   11:44 AM 09/05/2020  11:56 AM 09/02/2019   10:51 AM  6CIT Screen  What Year? 0 points 0 points 0 points 0 points  What month? 0 points 0 points 0 points 0 points  What time? 0 points 0 points 0 points 0 points  Count back from 20 0 points 0 points 0 points 0 points  Months in reverse 0 points 0 points 0 points 0 points  Repeat phrase 0 points 2 points 0 points 0 points  Total Score 0 points 2 points 0 points 0 points    Immunizations Immunization History  Administered Date(s) Administered   Fluad Quad(high Dose 65+) 10/01/2018, 10/12/2019, 10/17/2020, 09/25/2021, 09/25/2021,  10/04/2022   Influenza Split 11/13/2010, 11/12/2011   Influenza Whole 10/27/2007, 11/08/2008, 12/08/2009   Influenza, High Dose Seasonal PF 10/09/2013, 10/28/2014, 09/28/2015, 10/07/2016, 10/17/2017   Influenza,inj,Quad PF,6+ Mos 09/30/2012   PFIZER Comirnaty(Gray Top)Covid-19 Tri-Sucrose Vaccine 05/02/2020   PFIZER(Purple Top)SARS-COV-2 Vaccination 01/26/2019, 02/13/2019, 11/13/2019   Pfizer Covid-19 Vaccine Bivalent Booster 64yrs & up 11/27/2020   Pfizer(Comirnaty)Fall Seasonal Vaccine 12 years and older 10/23/2021   Pneumococcal Conjugate-13 05/25/2013   Pneumococcal Polysaccharide-23 05/08/2009   Tdap 10/13/2012   Zoster Recombinant(Shingrix) 09/27/2019, 12/31/2019   Zoster, Live 01/15/2011    TDAP status: Up to date  Flu Vaccine status: Completed at today's visit  Pneumococcal vaccine status: Up to date  Covid-19 vaccine status: Declined, Education has been provided regarding the importance of this vaccine but patient still declined. Advised may receive this vaccine at local pharmacy or Health Dept.or vaccine clinic. Aware to provide a copy of the vaccination record if obtained from local pharmacy or Health Dept. Verbalized acceptance and understanding.  Qualifies for Shingles Vaccine? Yes   Zostavax completed Yes   Shingrix Completed?: Yes  Screening Tests Health Maintenance  Topic Date Due   COVID-19 Vaccine (7 - 2023-24 season) 09/15/2022   DTaP/Tdap/Td (2 - Td or Tdap) 10/14/2022   Medicare Annual Wellness (AWV)  10/04/2023   Pneumonia Vaccine 65+ Years old  Completed   INFLUENZA VACCINE  Completed   DEXA SCAN  Completed   Zoster Vaccines- Shingrix  Completed   HPV VACCINES  Aged Out    Health Maintenance  Health Maintenance Due  Topic Date Due   COVID-19 Vaccine (7 - 2023-24 season) 09/15/2022        Bone Density status: Completed 03/06/21. Results reflect: Bone density results: OSTEOPENIA. Repeat every   years.    Additional Screening:    Vision  Screening: Recommended annual ophthalmology exams for early detection of glaucoma and other disorders of the eye. Is the patient up to date with their annual eye exam?  Yes  Who is the provider or what is the name of the office in which the patient attends annual eye exams? Dr Hazle Quant If pt is not established with a provider, would they like to be referred to a provider to establish care? No .   Dental Screening: Recommended annual dental exams for proper oral hygiene     Community Resource Referral / Chronic Care Management:  CRR required this visit?  No   CCM required this visit?  No     Plan:     I have personally reviewed and noted the following in the patient's chart:   Medical and social history Use of alcohol, tobacco or illicit drugs  Current medications and supplements including opioid prescriptions. Patient is not currently taking opioid prescriptions. Functional ability and status Nutritional status Physical activity Advanced directives List of other physicians Hospitalizations, surgeries,  and ER visits in previous 12 months Vitals Screenings to include cognitive, depression, and falls Referrals and appointments  In addition, I have reviewed and discussed with patient certain preventive protocols, quality metrics, and best practice recommendations. A written personalized care plan for preventive services as well as general preventive health recommendations were provided to patient.     Tillie Rung, LPN   1/61/0960   After Visit Summary: Given  Nurse Notes: None

## 2022-10-04 NOTE — Addendum Note (Signed)
Addended by: Tillie Rung on: 10/04/2022 10:40 AM   Modules accepted: Orders

## 2022-10-04 NOTE — Patient Instructions (Addendum)
[name] , Thank you for taking time to come for your Medicare Wellness Visit. I appreciate your ongoing commitment to your health goals. Please review the following plan we discussed and let me know if I can assist you in the future.   Referrals/Orders/Follow-Ups/Clinician Recommendations:   This is a list of the screening recommended for you and due dates:  Health Maintenance  Topic Date Due   COVID-19 Vaccine (7 - 2023-24 season) 09/15/2022   DTaP/Tdap/Td vaccine (2 - Td or Tdap) 10/14/2022   Medicare Annual Wellness Visit  10/04/2023   Pneumonia Vaccine  Completed   Flu Shot  Completed   DEXA scan (bone density measurement)  Completed   Zoster (Shingles) Vaccine  Completed   HPV Vaccine  Aged Out    Advanced directives: (In Chart) A copy of your advanced directives are scanned into your chart should your provider ever need it.  Next Medicare Annual Wellness Visit scheduled for next year: Yes

## 2022-10-15 ENCOUNTER — Encounter: Payer: Self-pay | Admitting: Family Medicine

## 2022-10-15 MED ORDER — CETIRIZINE HCL 5 MG PO TABS: 5.0000 mg | ORAL_TABLET | Freq: Every evening | ORAL | 0 refills | Status: DC

## 2022-10-21 ENCOUNTER — Encounter: Payer: Self-pay | Admitting: Family Medicine

## 2022-10-21 ENCOUNTER — Ambulatory Visit: Payer: Medicare Other

## 2022-10-21 ENCOUNTER — Ambulatory Visit (INDEPENDENT_AMBULATORY_CARE_PROVIDER_SITE_OTHER): Payer: Medicare Other | Admitting: Family Medicine

## 2022-10-21 VITALS — BP 136/76 | HR 80 | Temp 97.9°F | Resp 16 | Ht 66.0 in | Wt 198.2 lb

## 2022-10-21 DIAGNOSIS — W19XXXA Unspecified fall, initial encounter: Secondary | ICD-10-CM

## 2022-10-21 DIAGNOSIS — S79911A Unspecified injury of right hip, initial encounter: Secondary | ICD-10-CM

## 2022-10-21 DIAGNOSIS — R944 Abnormal results of kidney function studies: Secondary | ICD-10-CM | POA: Diagnosis not present

## 2022-10-21 DIAGNOSIS — M25551 Pain in right hip: Secondary | ICD-10-CM | POA: Diagnosis not present

## 2022-10-21 NOTE — Patient Instructions (Addendum)
A few things to remember from today's visit:  Fall, initial encounter  Injury of right hip, initial encounter - Plan: DG Hip Unilat W OR W/O Pelvis 2-3 Views Right  Continue daily activities as tolerated. Tylenol 500 mg 3-4 times per day as needed. Stretching exercises may also help.  Do not use My Chart to request refills or for acute issues that need immediate attention. If you send a my chart message, it may take a few days to be addressed, specially if I am not in the office.  Please be sure medication list is accurate. If a new problem present, please set up appointment sooner than planned today.

## 2022-10-21 NOTE — Progress Notes (Signed)
ACUTE VISIT Chief Complaint  Patient presents with   Fall    Fall happened on Wednesday, having pain on the back of leg where bruise is and the lower back.    HPI: [name] is a 82 y.o. female with a PMHx significant for HTN, neuropathy, and GERD, who is here today complaining of lower back pain from a fall.   She states she fell on 10/2 in a church parking lot while volunteering. She says she caught her leg on a cart while turning and fell, landed on her right side. She states she needed to be assisted to get back up, but was able to continue volunteering and drive herself home, albeit with some pain.  She says she has pain in the back of her right leg and bilaterally in her lower back.  She says her pain has since worsened, and rates it as a 4/10 today. She believes the pain is slowly improving for the past 1-2 days. Pain is exacerbated by movement and alleviated by rest. She states she is still able to move around, but is moving slower than usual.  She denies any lower extremities numbness/tingling, changes in bowel/bladder function, or saddle anesthesia. Negative for decreased urine output or gross hematuria.  Lab Results  Component Value Date   NA 139 01/22/2022   CL 102 01/22/2022   K 4.6 01/22/2022   CO2 30 01/22/2022   BUN 17 01/22/2022   CREATININE 0.97 01/22/2022   GFR 55.51 (L) 01/22/2022   CALCIUM 9.5 01/22/2022   PHOS 4.0 06/16/2013   ALBUMIN 4.2 01/11/2020   GLUCOSE 86 01/22/2022   She has been taking ibuprofen and sitting on a heating pad for her pain.   Review of Systems  Constitutional:  Negative for appetite change, chills and fever.  Respiratory:  Negative for cough, shortness of breath and wheezing.   Cardiovascular:  Negative for leg swelling.  Gastrointestinal:  Negative for abdominal pain, nausea and vomiting.  Skin:  Negative for rash and wound.  Neurological:  Negative for syncope, weakness and headaches.  Psychiatric/Behavioral:   Negative for confusion and hallucinations.   See other pertinent positives and negatives in HPI.  Current Outpatient Medications on File Prior to Visit  Medication Sig Dispense Refill   amLODipine-benazepril (LOTREL) 5-20 MG capsule TAKE 1 CAPSULE DAILY 90 capsule 1   cetirizine (ZYRTEC) 5 MG tablet Take 1 tablet (5 mg total) by mouth at bedtime. 90 tablet 0   famotidine (PEPCID) 20 MG tablet Take 20 mg by mouth daily.     fluticasone (FLONASE) 50 MCG/ACT nasal spray Place 2 sprays into both nostrils daily. 48 g 3   Magnesium 400 MG TABS Take by mouth.     metoprolol succinate (TOPROL-XL) 50 MG 24 hr tablet TAKE 1 TABLET DAILY WITH OR IMMEDIATELY FOLLOWING A MEAL 90 tablet 2   Multiple Vitamin (MULTIVITAMIN) tablet Take 1 tablet by mouth daily.     No current facility-administered medications on file prior to visit.    Past Medical History:  Diagnosis Date   Allergy, unspecified not elsewhere classified    Anxiety    Arthritis    Cervical dysplasia    prior to hysterectomy   GERD (gastroesophageal reflux disease)    Headache(784.0)    Hypertension    IBS (irritable bowel syndrome)    Osteopenia 12/2015   T score -1.1 FRAX 3.7%/0.4%   Pelvic adhesions    Pelvic pain in female    Sinus infection  Thyroid disease    Parathyroid cyst   Allergies  Allergen Reactions   Other     Seldane   Prednisone     REACTION: heart palpatations  In larger doses   Seldane [Terfenadine]     SELDANE   Sulfa Antibiotics Other (See Comments)    REACTION: pt unable to remember REACTION: pt unable to remember   Sulfasalazine Other (See Comments)    REACTION: pt unable to remember   Sulfonamide Derivatives     REACTION: pt unable to remember   Vioxx [Rofecoxib]     REACTION: mouth swelling (VIOXX)    Social History   Socioeconomic History   Marital status: Widowed    Spouse name: Charlann Noss   Number of children: 1   Years of education: Not on file   Highest education level:  Bachelor's degree (e.g., BA, AB, BS)  Occupational History   Occupation: retired    Associate Professor: RETIRED  Tobacco Use   Smoking status: Never   Smokeless tobacco: Never  Vaping Use   Vaping status: Never Used  Substance and Sexual Activity   Alcohol use: Yes    Alcohol/week: 1.0 standard drink of alcohol    Types: 1 Glasses of wine per week    Comment: Rare   Drug use: No   Sexual activity: Yes    Birth control/protection: Surgical    Comment: 1st intercourse 82 yo-Fewer than 5 partners  Other Topics Concern   Not on file  Social History Narrative   Lives with husband.   Retired from working Barrister's clerk at [employer]   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   Right handed   One story home   Social Determinants of Health   Financial Resource Strain: Low Risk  (09/30/2022)   Overall Financial Resource Strain (CARDIA)    Difficulty of Paying Living Expenses: Not hard at all  Food Insecurity: No Food Insecurity (09/30/2022)   Hunger Vital Sign    Worried About Running Out of Food in the Last Year: Never true    Ran Out of Food in the Last Year: Never true  Transportation Needs: No Transportation Needs (09/30/2022)   PRAPARE - Administrator, Civil Service (Medical): No    Lack of Transportation (Non-Medical): No  Physical Activity: Insufficiently Active (09/30/2022)   Exercise Vital Sign    Days of Exercise per Week: 3 days    Minutes of Exercise per Session: 20 min  Stress: No Stress Concern Present (09/30/2022)   [employer] of Occupational Health - Occupational Stress Questionnaire    Feeling of Stress : Not at all  Social Connections: Moderately Integrated (09/30/2022)   Social Connection and Isolation Panel [NHANES]    Frequency of Communication with Friends and Family: More than three times a week    Frequency of Social Gatherings with Friends and Family: Twice a week    Attends Religious Services: More than 4 times per year    Active Member of  [employer] or Organizations: Yes    Attends Banker Meetings: 1 to 4 times per year    Marital Status: Widowed   Vitals:   10/21/22 0840  BP: 136/76  Pulse: 80  Resp: 16  Temp: 97.9 F (36.6 C)  SpO2: 98%   Body mass index is 32 kg/m.  Physical Exam Vitals and nursing note reviewed.  Constitutional:      General: She is not in acute distress.    Appearance: She is well-developed.  HENT:     Head: Normocephalic and atraumatic.  Eyes:     Conjunctiva/sclera: Conjunctivae normal.  Cardiovascular:     Rate and Rhythm: Normal rate and regular rhythm.     Heart sounds: No murmur heard.    Comments: DP pulses palpable. Pulmonary:     Effort: Pulmonary effort is normal. No respiratory distress.     Breath sounds: Normal breath sounds.  Abdominal:     Palpations: Abdomen is soft.     Tenderness: There is no abdominal tenderness.  Musculoskeletal:     Lumbar back: No tenderness or bony tenderness. Negative right straight leg raise test and negative left straight leg raise test.     Right hip: Tenderness present. Decreased range of motion.     Left hip: Tenderness present. Decreased range of motion.     Right upper leg: No tenderness.     Right lower leg: Tenderness present. No edema.     Left lower leg: No edema.     Comments: Decreased flexion and rotation of both hips. No length discrepancy of lower extremities or abnormal rotation.  Skin:    General: Skin is warm.     Findings: Ecchymosis present. No erythema or rash.       Neurological:     General: No focal deficit present.     Mental Status: She is alert and oriented to person, place, and time.     Cranial Nerves: No cranial nerve deficit.     Comments: Antalgic gait, not assisted.  Psychiatric:        Mood and Affect: Mood and affect normal.   ASSESSMENT AND PLAN:  Ms. Finkbiner was seen today for lower back pain from a fall.   Fall, initial encounter Fall precautions discussed.  Injury of right hip,  initial encounter Examination today does not suggest fracture or dislocation, she would like a hip x-ray. Continue daily activities as tolerated. Tylenol 500 mg 3-4 times per day for pain management. Follow-up as needed.  Hip X ray negative for fracture or dislocation, pending formal reading.  -     DG HIP UNILAT W OR W/O PELVIS 2-3 VIEWS RIGHT; Future  Abnormal renal function test In 01/2022 EGFR 55, no history of CKD. We discussed some side effects of NSAIDs, recommend avoiding them. Continue adequate hydration and low-salt diet.  Return if symptoms worsen or fail to improve.  I, Rolla Etienne Wierda, acting as a scribe for Milania Haubner [place], MD., have documented all relevant documentation on the behalf of Salem Mastrogiovanni [place], MD, as directed by  Lander Eslick [place], MD while in the presence of Loyd Salvador [place], MD.   I, Keyshun Elpers [place], MD, have reviewed all documentation for this visit. The documentation on 10/21/22 for the exam, diagnosis, procedures, and orders are all accurate and complete.  Davionte Lusby G. [place], MD  [facility]. [place] office.

## 2022-10-30 DIAGNOSIS — M533 Sacrococcygeal disorders, not elsewhere classified: Secondary | ICD-10-CM | POA: Diagnosis not present

## 2022-11-13 ENCOUNTER — Other Ambulatory Visit (HOSPITAL_BASED_OUTPATIENT_CLINIC_OR_DEPARTMENT_OTHER): Payer: Self-pay

## 2022-11-13 DIAGNOSIS — Z23 Encounter for immunization: Secondary | ICD-10-CM | POA: Diagnosis not present

## 2022-11-13 MED ORDER — COVID-19 MRNA VAC-TRIS(PFIZER) 30 MCG/0.3ML IM SUSY
0.3000 mL | PREFILLED_SYRINGE | Freq: Once | INTRAMUSCULAR | 0 refills | Status: AC
  Filled 2022-11-13: qty 0.3, 1d supply, fill #0

## 2022-11-14 ENCOUNTER — Ambulatory Visit: Payer: Medicare Other | Admitting: Physician Assistant

## 2022-11-14 ENCOUNTER — Encounter: Payer: Self-pay | Admitting: Physician Assistant

## 2022-11-14 VITALS — BP 162/85 | HR 71 | Ht 66.5 in | Wt 197.0 lb

## 2022-11-14 DIAGNOSIS — R631 Polydipsia: Secondary | ICD-10-CM | POA: Diagnosis not present

## 2022-11-14 DIAGNOSIS — L853 Xerosis cutis: Secondary | ICD-10-CM

## 2022-11-14 DIAGNOSIS — I10 Essential (primary) hypertension: Secondary | ICD-10-CM | POA: Diagnosis not present

## 2022-11-14 LAB — GLUCOSE, POCT (MANUAL RESULT ENTRY): POC Glucose: 97 mg/dL (ref 70–99)

## 2022-11-14 NOTE — Patient Instructions (Signed)
Your random blood sugar level was within normal limits.  Your blood pressure is elevated, I do encourage you to check your blood pressure at home, keep a written log and have available for all office visits.  Please feel free to return to the mobile unit or follow-up with your primary care provider if your blood pressure readings continue to be elevated.  Roney Jaffe, PA-C Physician Assistant [facility] [email or web address]   How to Take Your Blood Pressure Blood pressure is a measurement of how strongly your blood is pressing against the walls of your arteries. Arteries are blood vessels that carry blood from your heart throughout your body. Your health care provider takes your blood pressure at each office visit. You can also take your own blood pressure at home with a blood pressure monitor. You may need to take your own blood pressure to: Confirm a diagnosis of high blood pressure (hypertension). Monitor your blood pressure over time. Make sure your blood pressure medicine is working. Supplies needed: Blood pressure monitor. A chair to sit in. This should be a chair where you can sit upright with your back supported. Do not sit on a soft couch or an armchair. Table or desk. Small notebook and pencil or pen. How to prepare To get the most accurate reading, avoid the following for 30 minutes before you check your blood pressure: Drinking caffeine. Drinking alcohol. Eating. Smoking. Exercising. Five minutes before you check your blood pressure: Use the bathroom and urinate so that you have an empty bladder. Sit quietly in a chair. Do not talk. How to take your blood pressure To check your blood pressure, follow the instructions in the manual that came with your blood pressure monitor. If you have a digital blood pressure monitor, the instructions may be as follows: Sit up straight in a chair. Place your feet on the floor. Do  not cross your ankles or legs. Rest your left arm at the level of your heart on a table or desk or on the arm of a chair. Pull up your shirt sleeve. Wrap the blood pressure cuff around the upper part of your left arm, 1 inch (2.5 cm) above your elbow. It is best to wrap the cuff around bare skin. Fit the cuff snugly, but not too tightly, around your arm. You should be able to place only one finger between the cuff and your arm. Position the cord so that it rests in the bend of your elbow. Press the power button. Sit quietly while the cuff inflates and deflates. Read the digital reading on the monitor screen and write the numbers down (record them) in a notebook. Wait 2-3 minutes, then repeat the steps, starting at step 1. What does my blood pressure reading mean? A blood pressure reading consists of a higher number over a lower number. Ideally, your blood pressure should be below 120/80. The first ("top") number is called the systolic pressure. It is a measure of the pressure in your arteries as your heart beats. The second ("bottom") number is called the diastolic pressure. It is a measure of the pressure in your arteries as the heart relaxes. Blood pressure is classified into four stages. The following are the stages for adults who do not have a short-term serious illness or a chronic condition. Systolic pressure and diastolic pressure are measured in a unit called mm Hg (millimeters of mercury).  Normal Systolic pressure: below 120. Diastolic pressure: below 80. Elevated Systolic pressure: 120-129. Diastolic pressure: below  80. Hypertension stage 1 Systolic pressure: 130-139. Diastolic pressure: 80-89. Hypertension stage 2 Systolic pressure: 140 or above. Diastolic pressure: 90 or above. You can have elevated blood pressure or hypertension even if only the systolic or only the diastolic number in your reading is higher than normal. Follow these instructions at home: Medicines Take  over-the-counter and prescription medicines only as told by your health care provider. Tell your health care provider if you are having any side effects from blood pressure medicine. General instructions Check your blood pressure as often as recommended by your health care provider. Check your blood pressure at the same time every day. Take your monitor to the next appointment with your health care provider to make sure that: You are using it correctly. It provides accurate readings. Understand what your goal blood pressure numbers are. Keep all follow-up visits. This is important. General tips Your health care provider can suggest a reliable monitor that will meet your needs. There are several types of home blood pressure monitors. Choose a monitor that has an arm cuff. Do not choose a monitor that measures your blood pressure from your wrist or finger. Choose a cuff that wraps snugly, not too tight or too loose, around your upper arm. You should be able to fit only one finger between your arm and the cuff. You can buy a blood pressure monitor at most drugstores or online. Where to find more information American Heart Association: www.heart.org Contact a health care provider if: Your blood pressure is consistently high. Your blood pressure is suddenly low. Get help right away if: Your systolic blood pressure is higher than 180. Your diastolic blood pressure is higher than 120. These symptoms may be an emergency. Get help right away. Call 911. Do not wait to see if the symptoms will go away. Do not drive yourself to the hospital. Summary Blood pressure is a measurement of how strongly your blood is pressing against the walls of your arteries. A blood pressure reading consists of a higher number over a lower number. Ideally, your blood pressure should be below 120/80. Check your blood pressure at the same time every day. Avoid caffeine, alcohol, smoking, and exercise for 30 minutes prior  to checking your blood pressure. These agents can affect the accuracy of the blood pressure reading. This information is not intended to replace advice given to you by your health care provider. Make sure you discuss any questions you have with your health care provider. Document Revised: 09/14/2020 Document Reviewed: 09/14/2020 Elsevier Patient Education  2024 [employer].

## 2022-11-14 NOTE — Progress Notes (Signed)
New Patient Office Visit  Subjective    Patient ID: Margaret Mitchell, female    DOB: 07/04/1940  Age: 82 y.o. MRN: [id number]  CC:  Chief Complaint  Patient presents with   Pruritis    Arms and feet   Constant Thirst     HPI Margaret Mitchell states that she is concerned she may have diabetes, states that she has been experiencing increased thirst and itching on the top of her feet for the past 2 weeks.  States that she drinks approximately 60 ounces of water a day.  Denies any changes in her diet or any new medications.  States that she has used an over-the-counter spray on the top of both of her feet with relief of itching.  Denies any rash, any changes in color.    States that she does check her blood pressure at home on occasion, states it is generally at the higher end of normal.  Denies any hypertensive symptoms.     Outpatient Encounter Medications as of 11/14/2022  Medication Sig   amLODipine-benazepril (LOTREL) 5-20 MG capsule TAKE 1 CAPSULE DAILY   cetirizine (ZYRTEC) 5 MG tablet Take 1 tablet (5 mg total) by mouth at bedtime.   COVID-19 mRNA vaccine, Pfizer, (COMIRNATY) syringe Inject 0.3 mLs into the muscle once for 1 dose.   famotidine (PEPCID) 20 MG tablet Take 20 mg by mouth daily.   fluticasone (FLONASE) 50 MCG/ACT nasal spray Place 2 sprays into both nostrils daily.   Magnesium 400 MG TABS Take by mouth.   metoprolol succinate (TOPROL-XL) 50 MG 24 hr tablet TAKE 1 TABLET DAILY WITH OR IMMEDIATELY FOLLOWING A MEAL   Multiple Vitamin (MULTIVITAMIN) tablet Take 1 tablet by mouth daily.   No facility-administered encounter medications on file as of 11/14/2022.    Past Medical History:  Diagnosis Date   Allergy, unspecified not elsewhere classified    Anxiety    Arthritis    Cervical dysplasia    prior to hysterectomy   GERD (gastroesophageal reflux disease)    Headache(784.0)    Hypertension    IBS (irritable bowel syndrome)    Osteopenia 12/2015   T  score -1.1 FRAX 3.7%/0.4%   Pelvic adhesions    Pelvic pain in female    Sinus infection    Thyroid disease    Parathyroid cyst    Past Surgical History:  Procedure Laterality Date   ABDOMINAL HYSTERECTOMY  1983   TAH/BSO for pelvic pain and adhesions   APPENDECTOMY  1994   BREAST SURGERY     Biopsy-Benign   CATARACT EXTRACTION Bilateral 2013   COLPOSCOPY     FOOT SURGERY Bilateral    PARATHYROID ADENOMA REMOVED  02/1980   ROTATOR CUFF REPAIR     X2   SHOULDER INJECTION  10/2011   TONSILLECTOMY     TUBAL LIGATION  1981   VESICOVAGINAL FISTULA CLOSURE W/ TAH      Family History  Problem Relation Age of Onset   Dementia Mother    Diabetes Mother    Lung cancer Father    Leukemia Brother    Diabetes Maternal Aunt    Hypertension Maternal Grandmother    Heart disease Maternal Grandmother    Diabetes Maternal Grandmother    Stroke Maternal Grandmother    Heart failure Paternal Grandmother     Social History   Socioeconomic History   Marital status: Widowed    Spouse name: Charlann Noss   Number of children: 1  Years of education: Not on file   Highest education level: Bachelor's degree (e.g., BA, AB, BS)  Occupational History   Occupation: retired    Associate Professor: RETIRED  Tobacco Use   Smoking status: Never    Passive exposure: Never   Smokeless tobacco: Never  Vaping Use   Vaping status: Never Used  Substance and Sexual Activity   Alcohol use: Yes    Alcohol/week: 1.0 standard drink of alcohol    Types: 1 Glasses of wine per week    Comment: Rare   Drug use: No   Sexual activity: Yes    Birth control/protection: Surgical    Comment: 1st intercourse 82 yo-Fewer than 5 partners  Other Topics Concern   Not on file  Social History Narrative   Lives with husband.   Retired from working Barrister's clerk at [employer]   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   Right handed   One story home   Social Determinants of Health   Financial Resource Strain:  Low Risk  (09/30/2022)   Overall Financial Resource Strain (CARDIA)    Difficulty of Paying Living Expenses: Not hard at all  Food Insecurity: No Food Insecurity (09/30/2022)   Hunger Vital Sign    Worried About Running Out of Food in the Last Year: Never true    Ran Out of Food in the Last Year: Never true  Transportation Needs: No Transportation Needs (09/30/2022)   PRAPARE - Administrator, Civil Service (Medical): No    Lack of Transportation (Non-Medical): No  Physical Activity: Insufficiently Active (09/30/2022)   Exercise Vital Sign    Days of Exercise per Week: 3 days    Minutes of Exercise per Session: 20 min  Stress: No Stress Concern Present (09/30/2022)   [employer] of Occupational Health - Occupational Stress Questionnaire    Feeling of Stress : Not at all  Social Connections: Moderately Integrated (09/30/2022)   Social Connection and Isolation Panel [NHANES]    Frequency of Communication with Friends and Family: More than three times a week    Frequency of Social Gatherings with Friends and Family: Twice a week    Attends Religious Services: More than 4 times per year    Active Member of [employer] or Organizations: Yes    Attends Banker Meetings: 1 to 4 times per year    Marital Status: Widowed  Intimate Partner Violence: Not At Risk (10/04/2022)   Humiliation, Afraid, Rape, and Kick questionnaire    Fear of Current or Ex-Partner: No    Emotionally Abused: No    Physically Abused: No    Sexually Abused: No    Review of Systems  Constitutional: Negative.   HENT: Negative.    Eyes: Negative.   Respiratory:  Negative for shortness of breath.   Cardiovascular:  Negative for chest pain.  Gastrointestinal: Negative.   Genitourinary:  Negative for frequency.  Musculoskeletal: Negative.   Skin:  Positive for itching. Negative for rash.  Neurological: Negative.   Endo/Heme/Allergies: Negative.   Psychiatric/Behavioral: Negative.           Objective    BP (!) 162/85 (BP Location: Left Arm, Patient Position: Sitting, Cuff Size: Large)   Pulse 71   Ht 5' 6.5" (1.689 m)   Wt 197 lb (89.4 kg)   SpO2 94%   BMI 31.32 kg/m   Physical Exam Vitals and nursing note reviewed.  Constitutional:      Appearance: Normal appearance.  HENT:  Head: Normocephalic and atraumatic.     Right Ear: External ear normal.     Left Ear: External ear normal.     Nose: Nose normal.     Mouth/Throat:     Mouth: Mucous membranes are moist.     Pharynx: Oropharynx is clear.  Eyes:     Extraocular Movements: Extraocular movements intact.     Conjunctiva/sclera: Conjunctivae normal.     Pupils: Pupils are equal, round, and reactive to light.  Cardiovascular:     Rate and Rhythm: Normal rate and regular rhythm.     Pulses: Normal pulses.     Heart sounds: Normal heart sounds.  Pulmonary:     Effort: Pulmonary effort is normal.     Breath sounds: Normal breath sounds.  Musculoskeletal:        General: Normal range of motion.     Cervical back: Normal range of motion and neck supple.  Skin:    General: Skin is warm and dry.     Findings: No erythema or rash. Rash is not scaling or vesicular.  Neurological:     General: No focal deficit present.     Mental Status: She is alert and oriented to person, place, and time.  Psychiatric:        Mood and Affect: Mood normal.        Behavior: Behavior normal.        Thought Content: Thought content normal.        Judgment: Judgment normal.         Assessment & Plan:   Problem List Items Addressed This Visit   None Visit Diagnoses     Increased thirst    -  Primary   Relevant Orders   POCT glucose (manual entry) (Completed)   Dry skin       Elevated blood pressure reading in office with diagnosis of hypertension          1. Increased thirst Random glucose within normal limits.  Patient had BMP completed in January 2024 and glucose was within normal limits as well. - POCT  glucose (manual entry)  2. Dry skin Patient education given on supportive care  3. Elevated blood pressure reading in office with diagnosis of hypertension Patient encouraged to check blood pressure at home, keep a written log and have available for all office visits.  Red flags given for prompt reevaluation.   I have reviewed the patient's medical history (PMH, PSH, Social History, Family History, Medications, and allergies) , and have been updated if relevant. I spent 20 minutes reviewing chart and  face to face time with patient.    Return if symptoms worsen or fail to improve.   Kasandra Knudsen Mayers, PA-C

## 2022-11-29 DIAGNOSIS — Z1231 Encounter for screening mammogram for malignant neoplasm of breast: Secondary | ICD-10-CM | POA: Diagnosis not present

## 2022-12-04 ENCOUNTER — Encounter: Payer: Self-pay | Admitting: Obstetrics and Gynecology

## 2022-12-16 NOTE — Progress Notes (Signed)
82 y.o. G16P0010 Widowed [place] American female here for a breast and pelvic exam.    The patient is also followed for osteopenia. T score of spine -1.3. Larey Seat in October.  No fractures. FRAX 4.6%/0.7%.  Husband passed 4 years ago.  Has a new partner and intimate relationship for 3 years.  No concerns about sexual functioning.  Declines STD screening.   Did a cross country trip on a train.   PCP: Kristian Covey, MD   No LMP recorded. Patient has had a hysterectomy.           Sexually active: Yes.    The current method of family planning is status post hysterectomy.    Menopausal hormone therapy:  n/a Exercising: Yes.     Workout exercises 30 min Smoker:  no  OB History     Gravida  1   Para      Term      Preterm      AB  1   Living  0      SAB  1   IAB      Ectopic      Multiple      Live Births              HEALTH MAINTENANCE: Last 2 paps: 2011 normal History of abnormal Pap or positive HPV:  yes, Hx of dysplasia prior to 1983  Mammogram:  11/29/22 Breast Density Cat B, BI-RADS CAT 1 neg Colonoscopy:  02/02/15 normal Bone Density:  03/06/21  Result  osteopenia   Immunization History  Administered Date(s) Administered   Fluad Quad(high Dose 65+) 10/01/2018, 10/12/2019, 10/17/2020, 09/25/2021, 09/25/2021, 10/04/2022   Fluad Trivalent(High Dose 65+) 10/04/2022   Influenza Split 11/13/2010, 11/12/2011   Influenza Whole 10/27/2007, 11/08/2008, 12/08/2009   Influenza, High Dose Seasonal PF 10/09/2013, 10/28/2014, 09/28/2015, 10/07/2016, 10/17/2017   Influenza,inj,Quad PF,6+ Mos 09/30/2012   PFIZER Comirnaty(Gray Top)Covid-19 Tri-Sucrose Vaccine 05/02/2020   PFIZER(Purple Top)SARS-COV-2 Vaccination 01/26/2019, 02/13/2019, 11/13/2019   Pfizer Covid-19 Vaccine Bivalent Booster 17yrs & up 11/27/2020   Pfizer(Comirnaty)Fall Seasonal Vaccine 12 years and older 10/23/2021, 11/13/2022   Pneumococcal Conjugate-13 05/25/2013   Pneumococcal  Polysaccharide-23 05/08/2009   Tdap 10/13/2012   Zoster Recombinant(Shingrix) 09/27/2019, 12/31/2019   Zoster, Live 01/15/2011      reports that she has never smoked. She has never been exposed to tobacco smoke. She has never used smokeless tobacco. She reports current alcohol use of about 1.0 standard drink of alcohol per week. She reports that she does not use drugs.  Past Medical History:  Diagnosis Date   Allergy, unspecified not elsewhere classified    Anxiety    Arthritis    Cervical dysplasia    prior to hysterectomy   GERD (gastroesophageal reflux disease)    Headache(784.0)    Hypertension    IBS (irritable bowel syndrome)    Osteopenia 12/2015   T score -1.1 FRAX 3.7%/0.4%   Pelvic adhesions    Pelvic pain in female    Sinus infection    Thyroid disease    Parathyroid cyst    Past Surgical History:  Procedure Laterality Date   ABDOMINAL HYSTERECTOMY  1983   TAH/BSO for pelvic pain and adhesions   APPENDECTOMY  1994   BREAST SURGERY     Biopsy-Benign   CATARACT EXTRACTION Bilateral 2013   COLPOSCOPY     FOOT SURGERY Bilateral    PARATHYROID ADENOMA REMOVED  02/1980   ROTATOR CUFF REPAIR     X2  SHOULDER INJECTION  10/2011   TONSILLECTOMY     TUBAL LIGATION  1981   VESICOVAGINAL FISTULA CLOSURE W/ TAH      Current Outpatient Medications  Medication Sig Dispense Refill   amLODipine (NORVASC) 5 MG tablet Take 1 tablet (5 mg total) by mouth daily. 180 tablet 3   cetirizine (ZYRTEC) 5 MG tablet Take 1 tablet (5 mg total) by mouth at bedtime. 90 tablet 0   famotidine (PEPCID) 20 MG tablet Take 20 mg by mouth daily.     fluticasone (FLONASE) 50 MCG/ACT nasal spray Place 2 sprays into both nostrils daily. 48 g 3   metoprolol succinate (TOPROL-XL) 50 MG 24 hr tablet TAKE 1 TABLET DAILY WITH OR IMMEDIATELY FOLLOWING A MEAL 90 tablet 2   Multiple Vitamin (MULTIVITAMIN) tablet Take 1 tablet by mouth daily.     olmesartan (BENICAR) 20 MG tablet Take 1 tablet (20 mg  total) by mouth daily. 90 tablet 3   valACYclovir (VALTREX) 1000 MG tablet Take 4,000 mg by mouth once.     No current facility-administered medications for this visit.    ALLERGIES: Other, Prednisone, Seldane [terfenadine], Sulfa antibiotics, Sulfasalazine, Sulfonamide derivatives, and Vioxx [rofecoxib]  Family History  Problem Relation Age of Onset   Dementia Mother    Diabetes Mother    Lung cancer Father    Leukemia Brother    Diabetes Maternal Aunt    Hypertension Maternal Grandmother    Heart disease Maternal Grandmother    Diabetes Maternal Grandmother    Stroke Maternal Grandmother    Heart failure Paternal Grandmother     Review of Systems  All other systems reviewed and are negative.   PHYSICAL EXAM:  BP 134/84 (BP Location: Right Arm, Patient Position: Sitting, Cuff Size: Small)   Pulse 74   Ht 5' 4.5" (1.638 m)   Wt 194 lb (88 kg)   SpO2 98%   BMI 32.79 kg/m     General appearance: alert, cooperative and appears stated age Head: normocephalic, without obvious abnormality, atraumatic Neck: no adenopathy, supple, symmetrical, trachea midline and thyroid normal to inspection and palpation Lungs: clear to auscultation bilaterally Breasts: normal appearance, no masses or tenderness, No nipple retraction or dimpling, No nipple discharge or bleeding, No axillary adenopathy Heart: regular rate and rhythm Abdomen: soft, non-tender; no masses, no organomegaly Extremities: extremities normal, atraumatic, no cyanosis or edema Skin: skin color, texture, turgor normal. No rashes or lesions Lymph nodes: cervical, supraclavicular, and axillary nodes normal. Neurologic: grossly normal  Pelvic: External genitalia:  no lesions              No abnormal inguinal nodes palpated.              Urethra:  normal appearing urethra with no masses, tenderness or lesions              Bartholins and Skenes: normal                 Vagina: normal appearing vagina with normal color and  discharge, no lesions              Cervix:  absent              Pap taken: no Bimanual Exam:  Uterus:  absent              Adnexa: no mass, fullness, tenderness              Rectal exam: Yes.  .  Confirms.  Anus:  normal sphincter tone, no lesions    ASSESSMENT: Encounter for breast and pelvic exam.  Status post TAH/BSO.  Remote history of abnormal pap prior to hysterectomy.  Status post vesicovaginal fistula closure with TAH.  Osteopenia.  Low FRAX scores. Status post parathryoid adenoma removal.   PLAN: Mammogram screening discussed. Self breast awareness reviewed. Pap and HRV collected:  no.  Not indicated.  Guidelines for Calcium, Vitamin D, regular exercise program including cardiovascular and weight bearing exercise. Medication refills:  NA Bone density and FRAX reviewed.  BMD in Feb. 20[facility] at [facility].  Follow up:  2 years and prn.   20 min  total time was spent for this patient encounter, including preparation, face-to-face counseling with the patient, coordination of care, and documentation of the encounter in addition to doing the breast and pelvic exam.

## 2022-12-27 ENCOUNTER — Ambulatory Visit (INDEPENDENT_AMBULATORY_CARE_PROVIDER_SITE_OTHER): Payer: Medicare Other | Admitting: Family Medicine

## 2022-12-27 ENCOUNTER — Encounter: Payer: Self-pay | Admitting: Family Medicine

## 2022-12-27 VITALS — BP 156/80 | HR 85 | Temp 97.9°F | Ht 66.5 in | Wt 197.1 lb

## 2022-12-27 DIAGNOSIS — R1310 Dysphagia, unspecified: Secondary | ICD-10-CM | POA: Diagnosis not present

## 2022-12-27 DIAGNOSIS — I10 Essential (primary) hypertension: Secondary | ICD-10-CM

## 2022-12-27 LAB — BASIC METABOLIC PANEL
BUN: 11 mg/dL (ref 6–23)
CO2: 27 meq/L (ref 19–32)
Calcium: 8.8 mg/dL (ref 8.4–10.5)
Chloride: 107 meq/L (ref 96–112)
Creatinine, Ser: 0.87 mg/dL (ref 0.40–1.20)
GFR: 62.84 mL/min (ref >60.00)
Glucose, Bld: 80 mg/dL (ref 70–99)
Potassium: 3.5 meq/L (ref 3.5–5.1)
Sodium: 144 meq/L (ref 135–145)

## 2022-12-27 NOTE — Patient Instructions (Signed)
Bring in your BP cuff with you to compare with ours

## 2022-12-27 NOTE — Progress Notes (Signed)
Established Patient Office Visit  Subjective   Patient ID: Margaret Mitchell, female    DOB: Dec 17, 1940  Age: 82 y.o. MRN: [id number]  Chief Complaint  Patient presents with   Medical Management of Chronic Issues    HPI   Margaret Mitchell is seen today for medical follow-up.  She has had some ongoing issues with recurrent mouth ulcers.  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  This apparently is resistant to treatment.  Has intermittent sores.  Follow[facility] by specialist at [facility].  Has chronic problems including hypertension, GERD, hyperlipidemia, obesity.  She takes Lotrel 5/20 mg 1 daily and Toprol-XL 50 mg daily.  She states her home blood pressures are consistently around 130s to 135 systolic and 60s to 70s diastolic.  Denies any recent headaches.  No dizziness.  Compliant with medications.  She does relate some solid food dysphagia,  particularly with things like meats.  Sometimes has difficulty finishing a meal-can [place] to full sensation in her lower esophagus..  No appetite or weight changes.  No pain with swallowing.  Does have past history of GERD but currently controlled with Pepcid.  No recent active GERD symptoms.  No known history of esophageal stricture.  No history of nicotine use and does not use alcohol.  Past Medical History:  Diagnosis Date   Allergy, unspecified not elsewhere classified    Anxiety    Arthritis    Cervical dysplasia    prior to hysterectomy   GERD (gastroesophageal reflux disease)    Headache(784.0)    Hypertension    IBS (irritable bowel syndrome)    Osteopenia 12/2015   T score -1.1 FRAX 3.7%/0.4%   Pelvic adhesions    Pelvic pain in female    Sinus infection    Thyroid disease    Parathyroid cyst   Past Surgical History:  Procedure Laterality Date   ABDOMINAL HYSTERECTOMY  1983   TAH/BSO for pelvic pain and adhesions   APPENDECTOMY  1994   BREAST SURGERY     Biopsy-Benign   CATARACT EXTRACTION Bilateral 2013    COLPOSCOPY     FOOT SURGERY Bilateral    PARATHYROID ADENOMA REMOVED  02/1980   ROTATOR CUFF REPAIR     X2   SHOULDER INJECTION  10/2011   TONSILLECTOMY     TUBAL LIGATION  1981   VESICOVAGINAL FISTULA CLOSURE W/ TAH      reports that she has never smoked. She has never been exposed to tobacco smoke. She has never used smokeless tobacco. She reports current alcohol use of about 1.0 standard drink of alcohol per week. She reports that she does not use drugs. family history includes Dementia in her mother; Diabetes in her maternal aunt, maternal grandmother, and mother; Heart disease in her maternal grandmother; Heart failure in her paternal grandmother; Hypertension in her maternal grandmother; Leukemia in her brother; Lung cancer in her father; Stroke in her maternal grandmother. Allergies  Allergen Reactions   Other     Seldane   Prednisone     REACTION: heart palpatations  In larger doses   Seldane [Terfenadine]     SELDANE   Sulfa Antibiotics Other (See Comments)    REACTION: pt unable to remember REACTION: pt unable to remember   Sulfasalazine Other (See Comments)    REACTION: pt unable to remember   Sulfonamide Derivatives     REACTION: pt unable to remember   Vioxx [Rofecoxib]     REACTION: mouth swelling (VIOXX)    Review  of Systems  Constitutional:  Negative for weight loss.  Respiratory:  Negative for cough.   Cardiovascular:  Negative for chest pain.      Objective:     BP (!) 156/80 (BP Location: Left Arm, Patient Position: Sitting, Cuff Size: Normal)   Pulse 85   Temp 97.9 F (36.6 C) (Oral)   Ht 5' 6.5" (1.689 m)   Wt 197 lb 1.6 oz (89.4 kg)   SpO2 99%   BMI 31.34 kg/m  BP Readings from Last 3 Encounters:  12/27/22 (!) 156/80  11/14/22 (!) 162/85  10/21/22 136/76   Wt Readings from Last 3 Encounters:  12/27/22 197 lb 1.6 oz (89.4 kg)  11/14/22 197 lb (89.4 kg)  10/21/22 198 lb 4 oz (89.9 kg)      Physical Exam Vitals reviewed.   Constitutional:      General: She is not in acute distress.    Appearance: Normal appearance. She is not ill-appearing.  Cardiovascular:     Rate and Rhythm: Normal rate and regular rhythm.  Pulmonary:     Effort: Pulmonary effort is normal.     Breath sounds: Normal breath sounds. No wheezing or rales.  Musculoskeletal:     Cervical back: Neck supple. No tenderness.  Lymphadenopathy:     Cervical: No cervical adenopathy.  Neurological:     Mental Status: She is alert.      No results found for any visits on 12/27/22.  Last CBC Lab Results  Component Value Date   WBC 8.8 03/12/2021   HGB 13.4 03/12/2021   HCT 41.1 03/12/2021   MCV 95.1 03/12/2021   MCH 31.0 03/12/2021   RDW 12.3 03/12/2021   PLT 347 03/12/2021   Last metabolic panel Lab Results  Component Value Date   GLUCOSE 86 01/22/2022   NA 139 01/22/2022   K 4.6 01/22/2022   CL 102 01/22/2022   CO2 30 01/22/2022   BUN 17 01/22/2022   CREATININE 0.97 01/22/2022   GFR 55.51 (L) 01/22/2022   CALCIUM 9.5 01/22/2022   PHOS 4.0 06/16/2013   PROT 7.5 07/03/2021   ALBUMIN 4.2 01/11/2020   LABGLOB 2.6 04/08/2019   AGRATIO 1.7 04/08/2019   BILITOT 0.3 01/11/2020   ALKPHOS 89 01/11/2020   AST 21 01/11/2020   ALT 16 01/11/2020   ANIONGAP 8 03/12/2021      The ASCVD Risk score (Arnett DK, et al., 2019) failed to calculate for the following reasons:   The 2019 ASCVD risk score is only valid for ages 82 to 56    Assessment & Plan:   #1 hypertension.  Possible whitecoat syndrome.  Blood pressures consistently better controlled at home.  We suggest that she bring in her cuff to compare with either ours or at cardiology visit next week to confirm accurate readings with her home cuff.  Poorly controlled reading in office here today.  We did discuss her possible additional medications such as thiazide to her current regimen and she would like to get checked through cardiology office first before making additional  medication -Reminder to keep sodium intake down. -Handout on Dash eating plan given -Check basic metabolic panel  #2 solid food dysphagia.  She has had several months of this.  No red flags such as appetite change, weight loss, pain with swallowing.  We recommend soft to chew foods and eat slowly.  Set up GI referral for further evaluation   Return in about 6 months (around 06/27/2023).    Evelena Peat, MD

## 2022-12-30 ENCOUNTER — Ambulatory Visit: Payer: Medicare Other | Attending: Cardiovascular Disease | Admitting: Cardiovascular Disease

## 2022-12-30 ENCOUNTER — Ambulatory Visit (INDEPENDENT_AMBULATORY_CARE_PROVIDER_SITE_OTHER): Payer: Medicare Other | Admitting: Obstetrics and Gynecology

## 2022-12-30 ENCOUNTER — Encounter: Payer: Self-pay | Admitting: Obstetrics and Gynecology

## 2022-12-30 ENCOUNTER — Encounter: Payer: Self-pay | Admitting: Cardiovascular Disease

## 2022-12-30 ENCOUNTER — Encounter: Payer: Medicare Other | Admitting: Obstetrics and Gynecology

## 2022-12-30 VITALS — BP 134/84 | HR 74 | Ht 64.5 in | Wt 194.0 lb

## 2022-12-30 VITALS — BP 138/88 | HR 72 | Ht 66.5 in | Wt 197.6 lb

## 2022-12-30 DIAGNOSIS — I340 Nonrheumatic mitral (valve) insufficiency: Secondary | ICD-10-CM | POA: Diagnosis not present

## 2022-12-30 DIAGNOSIS — I10 Essential (primary) hypertension: Secondary | ICD-10-CM | POA: Diagnosis not present

## 2022-12-30 DIAGNOSIS — I5189 Other ill-defined heart diseases: Secondary | ICD-10-CM | POA: Insufficient documentation

## 2022-12-30 DIAGNOSIS — Z01419 Encounter for gynecological examination (general) (routine) without abnormal findings: Secondary | ICD-10-CM

## 2022-12-30 DIAGNOSIS — R002 Palpitations: Secondary | ICD-10-CM | POA: Diagnosis not present

## 2022-12-30 DIAGNOSIS — M8588 Other specified disorders of bone density and structure, other site: Secondary | ICD-10-CM

## 2022-12-30 MED ORDER — OLMESARTAN MEDOXOMIL 20 MG PO TABS: 20.0000 mg | ORAL_TABLET | Freq: Every day | ORAL | 3 refills | Status: DC

## 2022-12-30 MED ORDER — AMLODIPINE BESYLATE 5 MG PO TABS: 5.0000 mg | ORAL_TABLET | Freq: Every day | ORAL | 3 refills | Status: DC

## 2022-12-30 NOTE — Patient Instructions (Signed)

## 2022-12-30 NOTE — Progress Notes (Unsigned)
Cardiology Office Note    Date:  01/01/2023   ID:  Kennedie, Jankowiak 1941/01/08, MRN [id number]  PCP:  Kristian Covey, MD  Cardiologist:  Nicki Guadalajara, MD   2 year follow-up  cardiology evaluation initally referred through the courtesy of Dr. Evelena Peat for evaluation of palpitations.  History of Present Illness:  Margaret Mitchell is a 82 y.o. female who is followed by Dr. Evelena Peat.  I saw her for initial evaluation on March 18, 2019 and last saw her December 25, 2020.. She presents for a 2 year  follow-up cardiology evaluation.     Margaret Mitchell has a longstanding history of hypertension and over the years has been treated with Lotrel 5/20.  In 2007, metoprolol was added to her medical regimen.  Recently, she had been maintained on 100 mg of metoprolol succinate in addition to her Lotrel 5/20.  On December 21, 2018, her husband unfortunately passed away.  She admits to being under some increased stress since that time.  Over the past several months, she has noticed isolated episodes of palpitations which seem to occur after she sits down and is at rest and notes occasional isolated skips.  She is unaware of any sustained episodes of increasing heart rate or palpitations.  She had been seen by Dr. Caryl Never and was started on sertraline following her husband's death to help with some depression and anxiety.  She admits to using caffeine 1 cup/day of coffee.  She does not eat chocolates.  She was recently reevaluated by Dr. Caryl Never and her dose of metoprolol succinate was reduced from 100 mg down to 50 mg.  She is unaware of any major benefit with this dose reduction.  However she denies any chest pain PND orthopnea presyncope or syncope.  Remotely she states that she had worn a monitor and nothing was picked up.  Review of her records reveals that in 2017 she had a 2D echo Doppler study which showed an EF of 60 to 65%.  There was mild focal basal hypertrophy of the septum.   She had normal diastolic parameters.  There was minimal calcification to her aortic valve leaflets.  In February 2018 she underwent a [place] Myoview study which reviewed revealed normal perfusion without scar or ischemia and nuclear stress EF at 60%.  Thyroid function studies in June 2020 were normal.  When I initially saw her I recommended that she undergo a 4-year follow-up echo Doppler study and recommended a complete set of laboratory.  Her echo Doppler study on April 20, 2019 showed an EF of 60 to 65% and grade 2 diastolic dysfunction.  There was mild mitral regurgitation.  She was evaluated by Evelena Peat her primary provider in September 2021.  She apparently had developed some sinus drainage with coughing and chest congestion and was instructed to go to the ER on November 1.  During that evaluation her potassium was 3.4 and calcium 8.7.  When I saw her on December 06, 2019 she felt well and denied any chest pain or palpitations.  She continued to be on  amlodipine/benazepril 5/20 mg daily as well as metoprolol succinate 50 mg for blood pressure control.  She had her Covid booster vaccine in October.    I saw her on December 12, 20232. Over the past year, she has lost approximately 9 pounds.  Presently she feels well.  She denies any chest pain, shortness of breath, palpitations, presyncope or syncope.  ECG  showed sinus rhythm at 65 bpm.  Blood pressure was stable on amlodipine/benazepril 5/20 mg in addition to metoprolol succinate 50 mg.  She was evaluated by Bernadene Person, NP on December 25, 2021 and remained stable without palpitations chest pain shortness of breath or leg swelling.  Presently, she feels well.  She tells me her blood pressure at home has been running in the range of 1 30-1 45 with diastolics in the upper 70s to 80s.  Pressure on recent days on December 13 was 145/88 with a pulse of 71, on December 14 145/85 with a pulse of 69.  He denies chest pain or shortness of breath.   She is unaware of palpitations.  She denies presyncope or syncope.  She presents for evaluation.   Past Medical History:  Diagnosis Date   Allergy, unspecified not elsewhere classified    Anxiety    Arthritis    Cervical dysplasia    prior to hysterectomy   GERD (gastroesophageal reflux disease)    Headache(784.0)    Hypertension    IBS (irritable bowel syndrome)    Osteopenia 12/2015   T score -1.1 FRAX 3.7%/0.4%   Pelvic adhesions    Pelvic pain in female    Sinus infection    Thyroid disease    Parathyroid cyst    Past Surgical History:  Procedure Laterality Date   ABDOMINAL HYSTERECTOMY  1983   TAH/BSO for pelvic pain and adhesions   APPENDECTOMY  1994   BREAST SURGERY     Biopsy-Benign   CATARACT EXTRACTION Bilateral 2013   COLPOSCOPY     FOOT SURGERY Bilateral    PARATHYROID ADENOMA REMOVED  02/1980   ROTATOR CUFF REPAIR     X2   SHOULDER INJECTION  10/2011   TONSILLECTOMY     TUBAL LIGATION  1981   VESICOVAGINAL FISTULA CLOSURE W/ TAH      Current Medications: Outpatient Medications Prior to Visit  Medication Sig Dispense Refill   cetirizine (ZYRTEC) 5 MG tablet Take 1 tablet (5 mg total) by mouth at bedtime. 90 tablet 0   famotidine (PEPCID) 20 MG tablet Take 20 mg by mouth daily.     fluticasone (FLONASE) 50 MCG/ACT nasal spray Place 2 sprays into both nostrils daily. 48 g 3   metoprolol succinate (TOPROL-XL) 50 MG 24 hr tablet TAKE 1 TABLET DAILY WITH OR IMMEDIATELY FOLLOWING A MEAL 90 tablet 2   Multiple Vitamin (MULTIVITAMIN) tablet Take 1 tablet by mouth daily.     amLODipine-benazepril (LOTREL) 5-20 MG capsule TAKE 1 CAPSULE DAILY 90 capsule 1   No facility-administered medications prior to visit.     Allergies:   Other, Prednisone, Seldane [terfenadine], Sulfa antibiotics, Sulfasalazine, Sulfonamide derivatives, and Vioxx [rofecoxib]   Social History   Socioeconomic History   Marital status: Widowed    Spouse name: Charlann Noss   Number of  children: 1   Years of education: Not on file   Highest education level: Bachelor's degree (e.g., BA, AB, BS)  Occupational History   Occupation: retired    Associate Professor: RETIRED  Tobacco Use   Smoking status: Never    Passive exposure: Never   Smokeless tobacco: Never  Vaping Use   Vaping status: Never Used  Substance and Sexual Activity   Alcohol use: Yes    Alcohol/week: 1.0 standard drink of alcohol    Types: 1 Glasses of wine per week    Comment: Rare   Drug use: No   Sexual activity: Yes  Birth control/protection: Surgical    Comment: 1st intercourse 82 yo-Fewer than 5 partners  Other Topics Concern   Not on file  Social History Narrative   Lives with husband.   Retired from working Barrister's clerk at [employer]   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   Right handed   One story home   Social Drivers of Health   Financial Resource Strain: Low Risk  (Jan 10, 2023)   Overall Financial Resource Strain (CARDIA)    Difficulty of Paying Living Expenses: Not hard at all  Food Insecurity: No Food Insecurity (01/10/2023)   Hunger Vital Sign    Worried About Running Out of Food in the Last Year: Never true    Ran Out of Food in the Last Year: Never true  Transportation Needs: No Transportation Needs (01-10-2023)   PRAPARE - Administrator, Civil Service (Medical): No    Lack of Transportation (Non-Medical): No  Physical Activity: Insufficiently Active (01/10/2023)   Exercise Vital Sign    Days of Exercise per Week: 7 days    Minutes of Exercise per Session: 10 min  Stress: No Stress Concern Present (2023/01/10)   [employer] of Occupational Health - Occupational Stress Questionnaire    Feeling of Stress : Not at all  Social Connections: Moderately Integrated (10-Jan-2023)   Social Connection and Isolation Panel [NHANES]    Frequency of Communication with Friends and Family: More than three times a week    Frequency of Social Gatherings with Friends and  Family: Twice a week    Attends Religious Services: More than 4 times per year    Active Member of [employer] or Organizations: Yes    Attends Banker Meetings: More than 4 times per year    Marital Status: Widowed     Socially she is  widowed.  She was married to her husband initially for 10 years.  They were then divorced and lived apart for 40 years and then again were remarried for 15 years prior to his death on 01-10-19.  She has an adopted son and no grandchildren.  There is no tobacco history.  She does drink an occasional glass of wine.  She is retired and previously had worked for our [employer].  She graduated high school.  Family History:  The patient's family history includes Dementia in her mother; Diabetes in her maternal aunt, maternal grandmother, and mother; Heart disease in her maternal grandmother; Heart failure in her paternal grandmother; Hypertension in her maternal grandmother; Leukemia in her brother; Lung cancer in her father; Stroke in her maternal grandmother.  Her mother died at age 71 and had dementia.  Her father died at age 55 and had cancer as well as heart trouble.  Her brother died at age 70 secondary to leukemia.  ROS General: Negative; No fevers, chills, or night sweats;  HEENT: Negative; No changes in vision or hearing, sinus congestion, difficulty swallowing Pulmonary: Negative; No cough, wheezing, shortness of breath, hemoptysis Cardiovascular: See HPI GI: Negative; No nausea, vomiting, diarrhea, or abdominal pain GU: Negative; No dysuria, hematuria, or difficulty voiding Musculoskeletal: Negative; no myalgias, joint pain, or weakness Hematologic/Oncology: Negative; no easy bruising, bleeding Endocrine: History of removal of parathyroid adenoma Neuro: Negative; no changes in balance, headaches Skin: Negative; No rashes or skin lesions Psychiatric: Positive for depression Sleep: Negative; No snoring, daytime sleepiness,  hypersomnolence, bruxism, restless legs, hypnogognic hallucinations, no cataplexy  Other comprehensive 14 point system review is negative.  PHYSICAL EXAM:   VS:  BP 138/88   Pulse 72   Ht 5' 6.5" (1.689 m)   Wt 197 lb 9.6 oz (89.6 kg)   SpO2 100%   BMI 31.42 kg/m     Repeat blood pressure by me was 120/78  Wt Readings from Last 3 Encounters:  12/30/22 194 lb (88 kg)  12/30/22 197 lb 9.6 oz (89.6 kg)  12/27/22 197 lb 1.6 oz (89.4 kg)    General: Alert, oriented, no distress.  Skin: normal turgor, no rashes, warm and dry HEENT: Normocephalic, atraumatic. Pupils equal round and reactive to light; sclera anicteric; extraocular muscles intact;  Nose without nasal septal hypertrophy Mouth/Parynx benign; Mallinpatti scale 3 Neck: No JVD, no carotid bruits; normal carotid upstroke Lungs: clear to ausculatation and percussion; no wheezing or rales Chest wall: without tenderness to palpitation Heart: PMI not displaced, RRR, s1 s2 normal, 1/6 systolic murmur, no diastolic murmur, no rubs, gallops, thrills, or heaves Abdomen: soft, nontender; no hepatosplenomehaly, BS+; abdominal aorta nontender and not dilated by palpation. Back: no CVA tenderness Pulses 2+ Musculoskeletal: full range of motion, normal strength, no joint deformities Extremities: no clubbing cyanosis or edema, Homan's sign negative  Neurologic: grossly nonfocal; Cranial nerves grossly wnl Psychologic: Normal mood and affect   Studies/Labs Reviewed:   EKG Interpretation Date/Time:  Monday December 30 2022 12:00:33 EST Ventricular Rate:  72 PR Interval:  142 QRS Duration:  86 QT Interval:  402 QTC Calculation: 440 R Axis:   64  Text Interpretation: Normal sinus rhythm Normal ECG When compared with ECG of 15-Nov-2019 12:10, PREVIOUS ECG IS PRESENT Confirmed by Nicki Guadalajara ([id number]) on 12/30/2022 12:59:39 PM    December 25, 2020 ECG (independently read by me): NSR at 65, no ectopy    December 05, 2020 ECG  (independently read by me): NSR at 66; no ectopy; normal intervals  MArch 2021 ECG (independently read by me):  EKG normal sinus rhythm at 63 bpm.  LVH by voltage.  Normal intervals.  No ectopy.  No ST segment changes.  Recent Labs:    Latest Ref Rng & Units 12/27/2022   11:53 AM 01/22/2022   10:07 AM 03/12/2021    1:48 PM  BMP  Glucose 70 - 99 mg/dL 80  86  95   BUN 6 - 23 mg/dL 11  17  14    Creatinine 0.40 - 1.20 mg/dL 1.91  4.78  2.95   Sodium 135 - 145 mEq/L 144  139  142   Potassium 3.5 - 5.1 mEq/L 3.5  4.6  3.4   Chloride 96 - 112 mEq/L 107  102  105   CO2 19 - 32 mEq/L 27  30  29    Calcium 8.4 - 10.5 mg/dL 8.8  9.5  9.5         Latest Ref Rng & Units 07/03/2021    2:50 PM 01/11/2020   12:04 PM 04/08/2019    9:59 AM  Hepatic Function  Total Protein 6.1 - 8.1 g/dL 7.5  7.3  6.9   Albumin 3.5 - 5.2 g/dL  4.2  4.3   AST 0 - 37 U/L  21  23   ALT 0 - 35 U/L  16  16   Alk Phosphatase 39 - 117 U/L  89  80   Total Bilirubin 0.2 - 1.2 mg/dL  0.3  0.4   Bilirubin, Direct 0.0 - 0.3 mg/dL  0.1         Latest Ref Rng & Units  03/12/2021    1:48 PM 11/15/2019    1:08 PM 04/08/2019    9:59 AM  CBC  WBC 4.0 - 10.5 K/uL 8.8  5.2  7.0   Hemoglobin 12.0 - 15.0 g/dL 40.9  81.1  91.4   Hematocrit 36.0 - 46.0 % 41.1  40.7  38.4   Platelets 150 - 400 K/uL 347  254  298    Lab Results  Component Value Date   MCV 95.1 03/12/2021   MCV 95.5 11/15/2019   MCV 92 04/08/2019   Lab Results  Component Value Date   TSH 3.55 07/03/2021   Lab Results  Component Value Date   HGBA1C 6.1 05/03/2015     BNP No results found for: "BNP"  ProBNP No results found for: "PROBNP"   Lipid Panel     Component Value Date/Time   CHOL 178 04/08/2019 0959   TRIG 137 04/08/2019 0959   HDL 72 04/08/2019 0959   CHOLHDL 2.5 04/08/2019 0959   CHOLHDL 3 05/13/2012 1001   VLDL 25.0 05/13/2012 1001   LDLCALC 83 04/08/2019 0959   LDLDIRECT 104.8 05/07/2010 1004   LABVLDL 23 04/08/2019 0959      RADIOLOGY: No results found.    Additional studies/ records that were reviewed today include:  I reviewed the patient's prior medical records including her echo Doppler study in 01-14-16 and [place] Myoview study in January 13, 2017.  ECHO: 04/20/2019 IMPRESSIONS   1. Left ventricular ejection fraction, by estimation, is 60 to 65%. The  left ventricle has normal function. The left ventricle has no regional  wall motion abnormalities. Left ventricular diastolic parameters are  consistent with Grade II diastolic  dysfunction (pseudonormalization).   2. Right ventricular systolic function is normal. The right ventricular  size is normal. There is normal pulmonary artery systolic pressure. The  estimated right ventricular systolic pressure is 19.4 mmHg.   3. The mitral valve is grossly normal. Mild mitral valve regurgitation.  No evidence of mitral stenosis.   4. The aortic valve is tricuspid. Aortic valve regurgitation is not  visualized. No aortic stenosis is present.   5. The inferior vena cava is normal in size with greater than 50%  respiratory variability, suggesting right atrial pressure of 3 mmHg.   ASSESSMENT:    1. Essential hypertension   2. Palpitations   3. Mild mitral regurgitation   4. Grade II diastolic dysfunction     PLAN:  Margaret Mitchell is a very pleasant 82 year-old African-American female who is a widow since January 14, 2019.  Since her husband's death she experienced increased anxiety and stress and developed occasional episodes of isolated palpitations.  She denies any exertional precipitation to her palpitations and she denies any sustained runs of rhythm abnormality.  Typically the palpitations develop after she has been sitting down and resting for at least 10 minutes and then she notes an isolated skip intermittently.  This is unassociated with any lightheadedness or chest pain or left arm radiation.  At the time she had been on high-dose metoprolol succinate at  100 mg daily in addition to her amlodipine/benazepril.   I suspect she was bradycardic and when she sat down heart rate further slowed below the rate of her ectopic focus resulting in isolated PVC.  We discussed the importance of caffeine use.  Her dose of metoprolol had been reduced and when seen in March 2021 she was no longer bradycardic.  Her echo Doppler study showed normal systolic function with EF at 60 to  65%.  There was grade 2 diastolic dysfunction as well as mild mitral regurgitation.  I last saw her 2 years ago she has remained relatively stable.  However recently she tells me blood pressure has been increasing at home systolics in the 1 30-1 40 range and diastolics recently in the mid 80 range.  She has continued to be on amlodipine/benazepril 5/20 mg in addition to metoprolol succinate 50 mg.  Presently, I am electing to discontinue Lotrel.  I will continue with amlodipine 5 mg separately and change her benazepril to olmesartan 20 mg daily which should be more potent.  Her EKG today is stable demonstrating sinus rhythm at 72 bpm without ectopy. I discussed with her my plans for retirement.  She will be seen by Dr. Caryl Never for primary care in 2 to 3 months.  I have suggested she transition her cardiology care and see Dr. Chilton Si in approximately 8 months.     Medication Adjustments/Labs and Tests Ordered: Current medicines are reviewed at length with the patient today.  Concerns regarding medicines are outlined above.  Medication changes, Labs and Tests ordered today are listed in the Patient Instructions below. Patient Instructions  Medication Instructions:  STOP Lotrel Begin Amlodipine 5mg . Take this medication daily.  Begin Olmesartan 20mg . Take this medication daily.  *If you need a refill on your cardiac medications before your next appointment, please call your pharmacy*   Lab Work: none If you have labs (blood work) drawn today and your tests are completely normal,  you will receive your results only by: MyChart Message (if you have MyChart) OR A paper copy in the mail If you have any lab test that is abnormal or we need to change your treatment, we will call you to review the results.   Testing/Procedures: none   Follow-Up: At [facility], you and your health needs are our priority.  As part of our continuing mission to provide you with exceptional heart care, we have created designated Provider Care Teams.  These Care Teams include your primary Cardiologist (physician) and Advanced Practice Providers (APPs -  Physician Assistants and Nurse Practitioners) who all work together to provide you with the care you need, when you need it.  We recommend signing up for the patient portal called "MyChart".  Sign up information is provided on this After Visit Summary.  MyChart is used to connect with patients for Virtual Visits (Telemedicine).  Patients are able to view lab/test results, encounter notes, upcoming appointments, etc.  Non-urgent messages can be sent to your provider as well.   To learn more about what you can do with MyChart, go to [email or web address].    Your next appointment:   8 month(s)  Provider:   Chilton Si, MD     Signed, Nicki Guadalajara, MD  01/01/2023 2:03 PM    [facility] [address], [place], [place]  [address] Phone: [phone]

## 2022-12-30 NOTE — Patient Instructions (Addendum)
Medication Instructions:  STOP Lotrel Begin Amlodipine 5mg . Take this medication daily.  Begin Olmesartan 20mg . Take this medication daily.  *If you need a refill on your cardiac medications before your next appointment, please call your pharmacy*   Lab Work: none If you have labs (blood work) drawn today and your tests are completely normal, you will receive your results only by: MyChart Message (if you have MyChart) OR A paper copy in the mail If you have any lab test that is abnormal or we need to change your treatment, we will call you to review the results.   Testing/Procedures: none   Follow-Up: At [facility], you and your health needs are our priority.  As part of our continuing mission to provide you with exceptional heart care, we have created designated Provider Care Teams.  These Care Teams include your primary Cardiologist (physician) and Advanced Practice Providers (APPs -  Physician Assistants and Nurse Practitioners) who all work together to provide you with the care you need, when you need it.  We recommend signing up for the patient portal called "MyChart".  Sign up information is provided on this After Visit Summary.  MyChart is used to connect with patients for Virtual Visits (Telemedicine).  Patients are able to view lab/test results, encounter notes, upcoming appointments, etc.  Non-urgent messages can be sent to your provider as well.   To learn more about what you can do with MyChart, go to [email or web address].    Your next appointment:   8 month(s)  Provider:   Chilton Si, MD

## 2023-01-01 ENCOUNTER — Encounter: Payer: Self-pay | Admitting: Cardiovascular Disease

## 2023-01-01 NOTE — Telephone Encounter (Signed)
Called pharmacy, pt must try and fail a -sartan medication. Pt has tried and failed Losartan. Per pharmacy they entered an "override" that pt tried and failed the losartan and states that this and Olmesartan are not the Preferred drugs for the prior authorization. We will need to do this prior auth. Insurance number is [id number]. Pt ID # [id number]. Filled out and faxed prior authorization form. Will await response. Copy given to Dr Landry Dyke covering.

## 2023-01-02 ENCOUNTER — Telehealth: Payer: Self-pay | Admitting: Pharmacy Technician

## 2023-01-02 ENCOUNTER — Other Ambulatory Visit (HOSPITAL_COMMUNITY): Payer: Self-pay

## 2023-01-02 ENCOUNTER — Telehealth: Payer: Self-pay | Admitting: Cardiovascular Disease

## 2023-01-02 NOTE — Telephone Encounter (Signed)
Called patient with no answer. Left detailed message on vm Benicar PA approved and she could contact her pharmacy.

## 2023-01-02 NOTE — Telephone Encounter (Signed)
Pharmacy Patient Advocate Encounter   Received notification from Pt Calls Messages that prior authorization for olmesartan is required/requested.   Insurance verification completed.   The patient is insured through [employer] .   Per test claim: PA required; PA submitted to above mentioned insurance via CoverMyMeds Key/confirmation #/EOC [facility] Status is pending

## 2023-01-02 NOTE — Telephone Encounter (Signed)
Pharmacy Patient Advocate Encounter  Received notification from [facility] that Prior Authorization for olmesartan has been APPROVED from 12/03/22 to 01/13/2098   PA #/Case ID/Reference #: [id number]

## 2023-01-02 NOTE — Telephone Encounter (Signed)
Pt c/o medication issue:  1. Name of Medication:   olmesartan (BENICAR) 20 MG tablet    2. How are you currently taking this medication (dosage and times per day)?   3. Are you having a reaction (difficulty breathing--STAT)?   4. What is your medication issue? Pharmacy told her they are have trouble getting this authorized

## 2023-01-26 NOTE — Progress Notes (Signed)
 Chief Complaint: Dysphagia Primary GI Doctor: Dr. Shila  HPI: 83 year female patient that presents as a new patient with past medical history of GERD, anxiety, hypertension, IBS who was referred to me by Micheal Wolm ORN, MD on 12/27/2022, for a complaint of dysphagia.  On 12/27/22 patient went to see PCP and complained of recurrent mouth ulcers. She had bx which reportedly showed benign mucous membrane pemphigoid. Being managed by specialist at [facility].     She also complained of dysphagia with solids. She feels full sensation in her lower esophagus. Hx of GERD and taking Pepcid . No history of nicotine use and does not use alcohol.   On 12/23/2014 patient was last seen in our GI office by Dr. Nandigam for chronic constipation and she was started on Linzess  145 mcg po daily. She was also set up for colonoscopy on 02/02/2015, report below.  Interval History   Patient has history of GERD and currently taking Pepcid  20 mg in the morning. She does report pyrosis and regurgitation 3-4 times per week. She also has a lot of belching. She does not drink carbonated drinks or use a straw. She reports she has had been experiencing esophageal dysphagia with solids for the past few months. Patient denies nausea, vomiting, or weight loss. Her history includes EGD in 2007 where she had dilatation for dysphagia and since then has had no issues.     Patient no longer having issues with constipation. No abdominal pain or blood in stool.  Occasional glass of wine. Non smoker. Patient's family history includes father with lung CA.  Wt Readings from Last 3 Encounters:  01/27/23 197 lb 12.8 oz (89.7 kg)  12/30/22 194 lb (88 kg)  12/30/22 197 lb 9.6 oz (89.6 kg)    Past Medical History:  Diagnosis Date   Allergy, unspecified not elsewhere classified    Anxiety    Arthritis    Cervical dysplasia    prior to hysterectomy   GERD (gastroesophageal reflux disease)    Headache(784.0)    Hypertension    IBS  (irritable bowel syndrome)    Osteopenia 12/2015   T score -1.1 FRAX 3.7%/0.4%   Pelvic adhesions    Pelvic pain in female    Sinus infection    Thyroid  disease    Parathyroid cyst    Past Surgical History:  Procedure Laterality Date   ABDOMINAL HYSTERECTOMY  1983   TAH/BSO for pelvic pain and adhesions   APPENDECTOMY  1994   BREAST SURGERY     Biopsy-Benign   CATARACT EXTRACTION Bilateral 2013   COLPOSCOPY     FOOT SURGERY Bilateral    HAMMER TOE SURGERY Bilateral    PARATHYROID ADENOMA REMOVED  02/1980   ROTATOR CUFF REPAIR     X2   SHOULDER INJECTION  10/2011   TONSILLECTOMY     TUBAL LIGATION  1981   VESICOVAGINAL FISTULA CLOSURE W/ TAH      Current Outpatient Medications  Medication Sig Dispense Refill   amLODipine  (NORVASC ) 5 MG tablet Take 1 tablet (5 mg total) by mouth daily. 180 tablet 3   cetirizine  (ZYRTEC ) 5 MG tablet Take 1 tablet (5 mg total) by mouth at bedtime. 90 tablet 0   famotidine  (PEPCID ) 20 MG tablet Take 20 mg by mouth daily.     fluticasone  (FLONASE ) 50 MCG/ACT nasal spray Place 2 sprays into both nostrils daily. 48 g 3   metoprolol  succinate (TOPROL -XL) 50 MG 24 hr tablet TAKE 1 TABLET DAILY WITH OR  IMMEDIATELY FOLLOWING A MEAL 90 tablet 2   Multiple Vitamin (MULTIVITAMIN) tablet Take 1 tablet by mouth daily.     olmesartan  (BENICAR ) 20 MG tablet Take 1 tablet (20 mg total) by mouth daily. 90 tablet 3   valACYclovir  (VALTREX ) 1000 MG tablet Take 4,000 mg by mouth once. prn     No current facility-administered medications for this visit.    Allergies as of 01/27/2023 - Review Complete 01/27/2023  Allergen Reaction Noted   Other  10/01/2017   Prednisone     Seldane [terfenadine]  06/03/2007   Sulfa  antibiotics Other (See Comments) 10/01/2017   Sulfasalazine Other (See Comments) 10/21/2017   Sulfonamide derivatives     Vioxx [rofecoxib]     Family History  Problem Relation Age of Onset   Dementia Mother    Diabetes Mother    Lung cancer  Father    Leukemia Brother    Diabetes Maternal Aunt    Hypertension Maternal Grandmother    Heart disease Maternal Grandmother    Diabetes Maternal Grandmother    Stroke Maternal Grandmother    Heart failure Paternal Grandmother    Review of Systems:    Constitutional: No weight loss, fever, chills, weakness or fatigue HEENT: Eyes: No change in vision               Ears, Nose, Throat:  No change in hearing or congestion. No mouth sores. Skin: No rash or itching Cardiovascular: No chest pain, chest pressure or palpitations   Respiratory: No SOB or cough Gastrointestinal: See HPI and otherwise negative Genitourinary: No dysuria or change in urinary frequency Neurological: No headache, dizziness or syncope Musculoskeletal: No new muscle or joint pain Hematologic: No bleeding or bruising Psychiatric: No history of depression or anxiety   Physical Exam:  Vital signs: BP 130/80   Pulse 71   Ht 5' 4.5 (1.638 m)   Wt 197 lb 12.8 oz (89.7 kg)   SpO2 99%   BMI 33.43 kg/m   Constitutional:  Pleasant female appears to be in NAD, Well developed, Well nourished, alert and cooperative Neck:  Supple Throat: Oral cavity and pharynx without inflammation, swelling or lesion.  Respiratory: Respirations even and unlabored. Lungs clear to auscultation bilaterally.   No wheezes, crackles, or rhonchi.  Cardiovascular: Normal S1, S2. Regular rate and rhythm. No peripheral edema, cyanosis or pallor.  Gastrointestinal:  Soft, nondistended, nontender. No rebound or guarding. Normal bowel sounds. No appreciable masses or hepatomegaly. Rectal:  Not performed.  Skin:   Dry and intact without significant lesions or rashes. Psychiatric: Oriented to person, place and time. Demonstrates good judgement and reason without abnormal affect or behaviors.  RELEVANT LABS AND IMAGING: CBC    Latest Ref Rng & Units 03/12/2021    1:48 PM 11/15/2019    1:08 PM 04/08/2019    9:59 AM  CBC  WBC 4.0 - 10.5 K/uL 8.8   5.2  7.0   Hemoglobin 12.0 - 15.0 g/dL 86.5  86.5  86.7   Hematocrit 36.0 - 46.0 % 41.1  40.7  38.4   Platelets 150 - 400 K/uL 347  254  298     CMP     Latest Ref Rng & Units 12/27/2022   11:53 AM 01/22/2022   10:07 AM 07/03/2021    2:50 PM  CMP  Glucose 70 - 99 mg/dL 80  86    BUN 6 - 23 mg/dL 11  17    Creatinine 9.59 - 1.20 mg/dL 9.12  9.02  Sodium 135 - 145 mEq/L 144  139    Potassium 3.5 - 5.1 mEq/L 3.5  4.6    Chloride 96 - 112 mEq/L 107  102    CO2 19 - 32 mEq/L 27  30    Calcium 8.4 - 10.5 mg/dL 8.8  9.5    Total Protein 6.1 - 8.1 g/dL   7.5     Lab Results  Component Value Date   TSH 3.55 07/03/2021    04/20/19 Echo-Left ventricular ejection fraction, by estimation, is 60 to 65%.   02/02/2015 colonoscopy, recall 01/2025 ENDOSCOPIC IMPRESSION:  1. Normal colonoscopy  2. Small internal hemorrhoids  09/27/2005 colonoscopy Normal colonoscopy Melanosis coli c/w chronic laxative use  04/17/2005 EGD with Dr. Jakie Normal: Proximal esophagus to distal esophagus. Not seen.  Barrett's esophagus.  Esophageal inflammation.  Mucosal abnormality. stricture.  Varices. Normal:m distal esophagus to antrum.  Not seen: Tumor.  Ulcer.  Mucosal abnormality.  AVMs.  Foreign body.  Varices. Normal: pyloric sphincter to duodenal 2nd portion. Not seen: ulcer. Mucousal abnormality. AVM's. Foreign body. Other findings: in proximal esophagus comments: web dilated. Dilation: maloney dilator used, diamtee 56,58 F, moderate resistance, minimal Heme present on extraction. 2 total dilators used, Patient tolerance excellent, outcome: successful. Polyp: in duodenal bulb. max size: 4mm. sessile poylp. procedure: biopsy without cautery, removed, poylp retrieved, sent to path.  Assessment: Encounter Diagnoses  Name Primary?   Esophageal dysphagia Yes   Gastroesophageal reflux disease, unspecified whether esophagitis present    Belching   83 year old female patient with history of GERD that is  not controlled with famotidine  once daily now presenting with persistent esophageal dysphagia with solids. Hx of EGD with dilatation in the past which was effective. I will go ahead and start her on Omeprazole  20 mg in the morning and Famotidine  at bedtime. Reinforced GERD diet, no late meals. The belching most likely due to uncontrolled reflux. I will go ahead and schedule endoscopy with dilatation.   Plan: -Start Omeprazole  20 mg po daily  -Famotidine  20 mg po at bedtime -Reinforced GERD diet, no late meals -EGD with dilatation with Dr. Nandigam in the [facility] -Follow-up 3-4 weeks after procedures with me  Thank you for the courtesy of this consult. Please call me with any questions or concerns.   Kenidy Crossland, FNP-C [facility] 01/27/2023, 11:57 AM  Cc: Micheal Wolm ORN, MD

## 2023-01-27 ENCOUNTER — Encounter: Payer: Self-pay | Admitting: Gastroenterology

## 2023-01-27 ENCOUNTER — Encounter: Payer: Self-pay | Admitting: Family Medicine

## 2023-01-27 ENCOUNTER — Ambulatory Visit (INDEPENDENT_AMBULATORY_CARE_PROVIDER_SITE_OTHER): Payer: Medicare Other | Admitting: Family Medicine

## 2023-01-27 ENCOUNTER — Ambulatory Visit: Payer: Medicare Other | Admitting: Gastroenterology

## 2023-01-27 VITALS — BP 130/80 | HR 71 | Ht 64.5 in | Wt 197.8 lb

## 2023-01-27 VITALS — BP 140/80 | HR 73 | Temp 97.9°F

## 2023-01-27 DIAGNOSIS — R1319 Other dysphagia: Secondary | ICD-10-CM

## 2023-01-27 DIAGNOSIS — I10 Essential (primary) hypertension: Secondary | ICD-10-CM | POA: Diagnosis not present

## 2023-01-27 DIAGNOSIS — K219 Gastro-esophageal reflux disease without esophagitis: Secondary | ICD-10-CM | POA: Diagnosis not present

## 2023-01-27 DIAGNOSIS — R1310 Dysphagia, unspecified: Secondary | ICD-10-CM

## 2023-01-27 DIAGNOSIS — R142 Eructation: Secondary | ICD-10-CM | POA: Diagnosis not present

## 2023-01-27 MED ORDER — OMEPRAZOLE 20 MG PO CPDR: 20.0000 mg | DELAYED_RELEASE_CAPSULE | ORAL | 2 refills | Status: DC

## 2023-01-27 MED ORDER — FAMOTIDINE 20 MG PO TABS: 20.0000 mg | ORAL_TABLET | Freq: Every day | ORAL | 2 refills | Status: DC

## 2023-01-27 NOTE — Patient Instructions (Signed)
 You have been scheduled for an endoscopy. Please follow written instructions given to you at your visit today.  If you use inhalers (even only as needed), please bring them with you on the day of your procedure.  If you take any of the following medications, they will need to be adjusted prior to your procedure:   DO NOT TAKE 7 DAYS PRIOR TO TEST- Trulicity (dulaglutide) Ozempic, Wegovy (semaglutide) Mounjaro (tirzepatide) Bydureon Bcise (exanatide extended release)  DO NOT TAKE 1 DAY PRIOR TO YOUR TEST Rybelsus (semaglutide) Adlyxin (lixisenatide) Victoza (liraglutide) Byetta (exanatide) ___________________________________________________________________________    We have sent the following medications to your pharmacy for you to pick up at your convenience: Omeprazole , Pepcid    Due to recent changes in healthcare laws, you may see the results of your imaging and laboratory studies on MyChart before your provider has had a chance to review them.  We understand that in some cases there may be results that are confusing or concerning to you. Not all laboratory results come back in the same time frame and the provider may be waiting for multiple results in order to interpret others.  Please give us  48 hours in order for your provider to thoroughly review all the results before contacting the office for clarification of your results.   Thank you for choosing me and [facility].  Deanna May, NP     Food Choices for Gastroesophageal Reflux Disease, Adult When you have gastroesophageal reflux disease (GERD), the foods you eat and your eating habits are very important. Choosing the right foods can help ease the discomfort of GERD. Consider working with a dietitian to help you make healthy food choices. What are tips for following this plan? Reading food labels Look for foods that are low in saturated fat. Foods that have less than 5% of daily value (DV) of fat and 0 g of  trans fats may help with your symptoms. Cooking Cook foods using methods other than frying. This may include baking, steaming, grilling, or broiling. These are all methods that do not need a lot of fat for cooking. To add flavor, try to use herbs that are low in spice and acidity. Meal planning  Choose healthy foods that are low in fat, such as fruits, vegetables, whole grains, low-fat dairy products, lean meats, fish, and poultry. Eat frequent, small meals instead of three large meals each day. Eat your meals slowly, in a relaxed setting. Avoid bending over or lying down until 2-3 hours after eating. Limit high-fat foods such as fatty meats or fried foods. Limit your intake of fatty foods, such as oils, butter, and shortening. Avoid the following as told by your health care provider: Foods that cause symptoms. These may be different for different people. Keep a food diary to keep track of foods that cause symptoms. Alcohol. Drinking large amounts of liquid with meals. Eating meals during the 2-3 hours before bed. Lifestyle Maintain a healthy weight. Ask your health care provider what weight is healthy for you. If you need to lose weight, work with your health care provider to do so safely. Exercise for at least 30 minutes on 5 or more days each week, or as told by your health care provider. Avoid wearing clothes that fit tightly around your waist and chest. Do not use any products that contain nicotine or tobacco. These products include cigarettes, chewing tobacco, and vaping devices, such as e-cigarettes. If you need help quitting, ask your health care provider. Sleep with the head  of your bed raised. Use a wedge under the mattress or blocks under the bed frame to raise the head of the bed. Chew sugar-free gum after mealtimes. What foods should I eat?  Eat a healthy, well-balanced diet of fruits, vegetables, whole grains, low-fat dairy products, lean meats, fish, and poultry. Each person is  different. Foods that may trigger symptoms in one person may not trigger any symptoms in another person. Work with your health care provider to identify foods that are safe for you. The items listed above may not be a complete list of recommended foods and beverages. Contact a dietitian for more information. What foods should I avoid? Limiting some of these foods may help manage the symptoms of GERD. Everyone is different. Consult a dietitian or your health care provider to help you identify the exact foods to avoid, if any. Fruits Any fruits prepared with added fat. Any fruits that cause symptoms. For some people this may include citrus fruits, such as oranges, grapefruit, pineapple, and lemons. Vegetables Deep-fried vegetables. French fries. Any vegetables prepared with added fat. Any vegetables that cause symptoms. For some people, this may include tomatoes and tomato products, chili peppers, onions and garlic, and horseradish. Grains Pastries or quick breads with added fat. Meats and other proteins High-fat meats, such as fatty beef or pork, hot dogs, ribs, ham, sausage, salami, and bacon. Fried meat or protein, including fried fish and fried chicken. Nuts and nut butters, in large amounts. Dairy Whole milk and chocolate milk. Sour cream. Cream. Ice cream. Cream cheese. Milkshakes. Fats and oils Butter. Margarine. Shortening. Ghee. Beverages Coffee and tea, with or without caffeine. Carbonated beverages. Sodas. Energy drinks. Fruit juice made with acidic fruits, such as orange or grapefruit. Tomato juice. Alcoholic drinks. Sweets and desserts Chocolate and cocoa. Donuts. Seasonings and condiments Pepper. Peppermint and spearmint. Added salt. Any condiments, herbs, or seasonings that cause symptoms. For some people, this may include curry, hot sauce, or vinegar-based salad dressings. The items listed above may not be a complete list of foods and beverages to avoid. Contact a dietitian for  more information. Questions to ask your health care provider Diet and lifestyle changes are usually the first steps that are taken to manage symptoms of GERD. If diet and lifestyle changes do not improve your symptoms, talk with your health care provider about taking medicines. Where to find more information International Foundation for Gastrointestinal Disorders: aboutgerd.org Summary When you have gastroesophageal reflux disease (GERD), food and lifestyle choices may be very helpful in easing the discomfort of GERD. Eat frequent, small meals instead of three large meals each day. Eat your meals slowly, in a relaxed setting. Avoid bending over or lying down until 2-3 hours after eating. Limit high-fat foods such as fatty meats or fried foods. This information is not intended to replace advice given to you by your health care provider. Make sure you discuss any questions you have with your health care provider. Document Revised: 07/12/2019 Document Reviewed: 07/12/2019 Elsevier Patient Education  2024 [employer].

## 2023-01-27 NOTE — Progress Notes (Signed)
 Established Patient Office Visit  Subjective   Patient ID: Margaret Mitchell, female    DOB: December 24, 1940  Age: 83 y.o. MRN: [id number]  No chief complaint on file.   HPI   Margaret Mitchell is here for medical follow-up.  She has history of hypertension, GERD, osteopenia, restless leg syndrome.  She recently developed some recurrent dysphagia and we referred her to GI.  She is being set up for upper endoscopy with probable dilatation.  She has hypertension and recently saw cardiologist a month ago and they made some change in medication.  She was taken off Lotrel and switched to plain amlodipine  5 mg daily and olmesartan  20 mg daily.  She additionally takes metoprolol  XL 50 mg daily.  Compliant with medication.  Home blood pressure earlier today 120/76.  She does bring in her cuff today to compare with ours.  Tries to watch sodium intake.  Eats out infrequently.  Past Medical History:  Diagnosis Date   Allergy, unspecified not elsewhere classified    Anxiety    Arthritis    Cervical dysplasia    prior to hysterectomy   GERD (gastroesophageal reflux disease)    Headache(784.0)    Hypertension    IBS (irritable bowel syndrome)    Osteopenia 12/2015   T score -1.1 FRAX 3.7%/0.4%   Pelvic adhesions    Pelvic pain in female    Sinus infection    Thyroid  disease    Parathyroid cyst   Past Surgical History:  Procedure Laterality Date   ABDOMINAL HYSTERECTOMY  1983   TAH/BSO for pelvic pain and adhesions   APPENDECTOMY  1994   BREAST SURGERY     Biopsy-Benign   CATARACT EXTRACTION Bilateral 2013   COLPOSCOPY     FOOT SURGERY Bilateral    HAMMER TOE SURGERY Bilateral    PARATHYROID ADENOMA REMOVED  02/1980   ROTATOR CUFF REPAIR     X2   SHOULDER INJECTION  10/2011   TONSILLECTOMY     TUBAL LIGATION  1981   VESICOVAGINAL FISTULA CLOSURE W/ TAH      reports that she has never smoked. She has never been exposed to tobacco smoke. She has never used smokeless tobacco. She reports  current alcohol use of about 1.0 standard drink of alcohol per week. She reports that she does not use drugs. family history includes Dementia in her mother; Diabetes in her maternal aunt, maternal grandmother, and mother; Heart disease in her maternal grandmother; Heart failure in her paternal grandmother; Hypertension in her maternal grandmother; Leukemia in her brother; Lung cancer in her father; Stroke in her maternal grandmother. Allergies  Allergen Reactions   Other     Seldane   Prednisone     REACTION: heart palpatations  In larger doses   Seldane [Terfenadine]     SELDANE   Sulfa  Antibiotics Other (See Comments)    REACTION: pt unable to remember REACTION: pt unable to remember   Sulfasalazine Other (See Comments)    REACTION: pt unable to remember   Sulfonamide Derivatives     REACTION: pt unable to remember   Vioxx [Rofecoxib]     REACTION: mouth swelling (VIOXX)    Review of Systems  Constitutional:  Negative for malaise/fatigue and weight loss.  Eyes:  Negative for blurred vision.  Respiratory:  Negative for shortness of breath.   Cardiovascular:  Negative for chest pain.  Neurological:  Negative for dizziness, weakness and headaches.      Objective:     BP ROLLEN)  140/80 (BP Location: Left Arm, Cuff Size: Normal)   Pulse 73   Temp 97.9 F (36.6 C) (Oral)   SpO2 99%  BP Readings from Last 3 Encounters:  01/27/23 (!) 140/80  01/27/23 130/80  12/30/22 134/84   Wt Readings from Last 3 Encounters:  01/27/23 197 lb 12.8 oz (89.7 kg)  12/30/22 194 lb (88 kg)  12/30/22 197 lb 9.6 oz (89.6 kg)      Physical Exam Vitals reviewed.  Constitutional:      Appearance: She is well-developed.  Eyes:     Pupils: Pupils are equal, round, and reactive to light.  Neck:     Thyroid : No thyromegaly.     Vascular: No JVD.  Cardiovascular:     Rate and Rhythm: Normal rate and regular rhythm.     Heart sounds:     No gallop.  Pulmonary:     Effort: Pulmonary effort is  normal. No respiratory distress.     Breath sounds: Normal breath sounds. No wheezing or rales.  Musculoskeletal:     Cervical back: Neck supple.  Neurological:     Mental Status: She is alert.      No results found for any visits on 01/27/23.  Last metabolic panel Lab Results  Component Value Date   GLUCOSE 80 12/27/2022   NA 144 12/27/2022   K 3.5 12/27/2022   CL 107 12/27/2022   CO2 27 12/27/2022   BUN 11 12/27/2022   CREATININE 0.87 12/27/2022   GFR 62.84 12/27/2022   CALCIUM 8.8 12/27/2022   PHOS 4.0 06/16/2013   PROT 7.5 07/03/2021   ALBUMIN 4.2 01/11/2020   LABGLOB 2.6 04/08/2019   AGRATIO 1.7 04/08/2019   BILITOT 0.3 01/11/2020   ALKPHOS 89 01/11/2020   AST 21 01/11/2020   ALT 16 01/11/2020   ANIONGAP 8 03/12/2021      The ASCVD Risk score (Arnett DK, et al., 2019) failed to calculate for the following reasons:   The 2019 ASCVD risk score is only valid for ages 68 to 26    Assessment & Plan:   #1 hypertension.  Blood pressure was up significantly when she first got here today 160/80 but repeat after rest 140/80.  She obtained reading with her cuff 147/84.  Her home readings have been consistently down to 120s systolic and 70s diastolic.  She had reading earlier today at GI 130/80.  -Continue current regimen of amlodipine  5 mg daily, olmesartan  20 mg daily, and Toprol -XL 50 mg daily -Continue low-sodium diet -Continue close home monitoring. -She has follow-up scheduled in a couple of months.  Recheck basic metabolic panel at that time  #2 history of GERD with some recent dysphagia.  She had previous success with dilatation back in 2007.  Saw GI earlier today.  Prilosec added to her Pepcid .  Endoscopy with dilatation scheduled.  She knows to eat slowly and avoid hard to chew foods.   Wolm Scarlet, MD

## 2023-02-19 ENCOUNTER — Encounter: Payer: Self-pay | Admitting: Gastroenterology

## 2023-02-19 ENCOUNTER — Ambulatory Visit: Payer: Medicare Other | Admitting: Gastroenterology

## 2023-02-19 VITALS — BP 148/87 | HR 65 | Temp 97.7°F | Resp 14 | Ht 64.5 in | Wt 193.0 lb

## 2023-02-19 DIAGNOSIS — R1310 Dysphagia, unspecified: Secondary | ICD-10-CM

## 2023-02-19 DIAGNOSIS — K222 Esophageal obstruction: Secondary | ICD-10-CM | POA: Diagnosis not present

## 2023-02-19 DIAGNOSIS — F419 Anxiety disorder, unspecified: Secondary | ICD-10-CM | POA: Diagnosis not present

## 2023-02-19 DIAGNOSIS — K219 Gastro-esophageal reflux disease without esophagitis: Secondary | ICD-10-CM | POA: Diagnosis not present

## 2023-02-19 DIAGNOSIS — I10 Essential (primary) hypertension: Secondary | ICD-10-CM | POA: Diagnosis not present

## 2023-02-19 DIAGNOSIS — R1319 Other dysphagia: Secondary | ICD-10-CM

## 2023-02-19 DIAGNOSIS — K2950 Unspecified chronic gastritis without bleeding: Secondary | ICD-10-CM

## 2023-02-19 MED ORDER — SODIUM CHLORIDE 0.9 % IV SOLN: 500.0000 mL | Freq: Once | INTRAVENOUS | Status: DC

## 2023-02-19 NOTE — Op Note (Signed)
 [facility] Patient Name: Margaret Mitchell Procedure Date: 02/19/2023 1:29 PM MRN: [id number] Endoscopist: Gustav ALONSO Mcgee , MD, [id number] Age: 83 Referring MD:  Date of Birth: 05-Aug-1940 Gender: Female Account #: [id number] Procedure:                Upper GI endoscopy Indications:              Dysphagia, Esophageal reflux symptoms that persist                            despite appropriate therapy Medicines:                Monitored Anesthesia Care Procedure:                Pre-Anesthesia Assessment:                           - Prior to the procedure, a History and Physical                            was performed, and patient medications and                            allergies were reviewed. The patient's tolerance of                            previous anesthesia was also reviewed. The risks                            and benefits of the procedure and the sedation                            options and risks were discussed with the patient.                            All questions were answered, and informed consent                            was obtained. Prior Anticoagulants: The patient has                            taken no anticoagulant or antiplatelet agents. ASA                            Grade Assessment: II - A patient with mild systemic                            disease. After reviewing the risks and benefits,                            the patient was deemed in satisfactory condition to                            undergo the procedure.  After obtaining informed consent, the endoscope was                            passed under direct vision. Throughout the                            procedure, the patient's blood pressure, pulse, and                            oxygen saturations were monitored continuously. The                            GIF [id number] #[id number] was introduced through the                            mouth, and advanced to  the second part of duodenum.                            The upper GI endoscopy was accomplished without                            difficulty. The patient tolerated the procedure                            well. Scope In: Scope Out: Findings:                 One benign-appearing, intrinsic mild stenosis was                            found 37 to 38 cm from the incisors. This stenosis                            measured less than one cm (in length). The stenosis                            was traversed. The scope was withdrawn. Dilation                            was performed with a Maloney dilator with no                            resistance at 54 Fr. The dilation site was examined                            following endoscope reinsertion and showed no                            change. Biopsies were taken with a cold forceps for                            histology.  The exam of the esophagus was otherwise normal.                           Patchy mild inflammation characterized by                            congestion (edema), erythema and friability was                            found in the entire examined stomach. Biopsies were                            taken with a cold forceps for Helicobacter pylori                            testing.                           The cardia and gastric fundus were normal on                            retroflexion.                           The examined duodenum was normal. Complications:            No immediate complications. Estimated Blood Loss:     Estimated blood loss was minimal. Impression:               - Benign-appearing esophageal stenosis. Dilated.                            Biopsied.                           - Gastritis. Biopsied.                           - Normal examined duodenum. Recommendation:           - Patient has a contact number available for                            emergencies. The signs and  symptoms of potential                            delayed complications were discussed with the                            patient. Return to normal activities tomorrow.                            Written discharge instructions were provided to the                            patient.                           -  Resume previous diet.                           - Continue present medications.                           - Await pathology results.                           - Follow an antireflux regimen. Ahnya Akre V. Lesly Pontarelli, MD 02/19/2023 1:58:48 PM This report has been signed electronically.

## 2023-02-19 NOTE — Patient Instructions (Signed)

## 2023-02-19 NOTE — Progress Notes (Signed)
  Gastroenterology History and Physical   Primary Care Physician:  Micheal Wolm ORN, MD   Reason for Procedure:  GERD, dysphagia  Plan:    EGD with possible interventions as needed     HPI: Margaret Mitchell is a very pleasant 83 y.o. female here for EGD for evaluation of dysphagia, esophageal dilation and biopsies as needed   The risks and benefits as well as alternatives of endoscopic procedure(s) have been discussed and reviewed. All questions answered. The patient agrees to proceed.    Past Medical History:  Diagnosis Date   Allergy, unspecified not elsewhere classified    Anxiety    Arthritis    Cervical dysplasia    prior to hysterectomy   GERD (gastroesophageal reflux disease)    Headache(784.0)    Hypertension    IBS (irritable bowel syndrome)    Osteopenia 12/2015   T score -1.1 FRAX 3.7%/0.4%   Pelvic adhesions    Pelvic pain in female    Sinus infection    Thyroid  disease    Parathyroid cyst    Past Surgical History:  Procedure Laterality Date   ABDOMINAL HYSTERECTOMY  1983   TAH/BSO for pelvic pain and adhesions   APPENDECTOMY  1994   BREAST SURGERY     Biopsy-Benign   CATARACT EXTRACTION Bilateral 2013   COLPOSCOPY     FOOT SURGERY Bilateral    HAMMER TOE SURGERY Bilateral    PARATHYROID ADENOMA REMOVED  02/1980   ROTATOR CUFF REPAIR     X2   SHOULDER INJECTION  10/2011   TONSILLECTOMY     TUBAL LIGATION  1981   VESICOVAGINAL FISTULA CLOSURE W/ TAH      Prior to Admission medications   Medication Sig Start Date End Date Taking? Authorizing Provider  amLODipine  (NORVASC ) 5 MG tablet Take 1 tablet (5 mg total) by mouth daily. 12/30/22 03/30/23 Yes Burnard Debby LABOR, MD  cetirizine  (ZYRTEC ) 5 MG tablet Take 1 tablet (5 mg total) by mouth at bedtime. 10/15/22  Yes Burchette, Wolm ORN, MD  famotidine  (PEPCID ) 20 MG tablet Take 1 tablet (20 mg total) by mouth at bedtime. 01/27/23  Yes May, Deanna J, NP  fluticasone  (FLONASE ) 50 MCG/ACT nasal  spray Place 2 sprays into both nostrils daily. 08/01/22  Yes Burchette, Wolm ORN, MD  metoprolol  succinate (TOPROL -XL) 50 MG 24 hr tablet TAKE 1 TABLET DAILY WITH OR IMMEDIATELY FOLLOWING A MEAL 05/27/22  Yes Burchette, Wolm ORN, MD  Multiple Vitamin (MULTIVITAMIN) tablet Take 1 tablet by mouth daily.   Yes [provider]  olmesartan  (BENICAR ) 20 MG tablet Take 1 tablet (20 mg total) by mouth daily. 12/30/22  Yes Burnard Debby LABOR, MD  omeprazole  (PRILOSEC) 20 MG capsule Take 1 capsule (20 mg total) by mouth every morning. 01/27/23  Yes May, Deanna J, NP  valACYclovir  (VALTREX ) 1000 MG tablet Take 4,000 mg by mouth once. prn 12/29/22  Yes [provider]    Current Outpatient Medications  Medication Sig Dispense Refill   amLODipine  (NORVASC ) 5 MG tablet Take 1 tablet (5 mg total) by mouth daily. 180 tablet 3   cetirizine  (ZYRTEC ) 5 MG tablet Take 1 tablet (5 mg total) by mouth at bedtime. 90 tablet 0   famotidine  (PEPCID ) 20 MG tablet Take 1 tablet (20 mg total) by mouth at bedtime. 30 tablet 2   fluticasone  (FLONASE ) 50 MCG/ACT nasal spray Place 2 sprays into both nostrils daily. 48 g 3   metoprolol  succinate (TOPROL -XL) 50 MG 24 hr tablet  TAKE 1 TABLET DAILY WITH OR IMMEDIATELY FOLLOWING A MEAL 90 tablet 2   Multiple Vitamin (MULTIVITAMIN) tablet Take 1 tablet by mouth daily.     olmesartan  (BENICAR ) 20 MG tablet Take 1 tablet (20 mg total) by mouth daily. 90 tablet 3   omeprazole  (PRILOSEC) 20 MG capsule Take 1 capsule (20 mg total) by mouth every morning. 30 capsule 2   valACYclovir  (VALTREX ) 1000 MG tablet Take 4,000 mg by mouth once. prn     Current Facility-Administered Medications  Medication Dose Route Frequency Provider Last Rate Last Admin   0.9 %  sodium chloride  infusion  500 mL Intravenous Once Niclas Markell V, MD        Allergies as of 02/19/2023 - Review Complete 02/19/2023  Allergen Reaction Noted   Prednisone Other (See Comments)    Sulfa  antibiotics  Swelling 10/01/2017   Sulfasalazine Swelling 10/21/2017   Sulfonamide derivatives Swelling    Vioxx [rofecoxib] Other (See Comments)    Other Other (See Comments) 10/01/2017   Seldane [terfenadine] Other (See Comments) 06/03/2007    Family History  Problem Relation Age of Onset   Dementia Mother    Diabetes Mother    Lung cancer Father    Leukemia Brother    Diabetes Maternal Aunt    Hypertension Maternal Grandmother    Heart disease Maternal Grandmother    Diabetes Maternal Grandmother    Stroke Maternal Grandmother    Heart failure Paternal Grandmother    Colon cancer Neg Hx    Esophageal cancer Neg Hx    Stomach cancer Neg Hx    Rectal cancer Neg Hx     Social History   Socioeconomic History   Marital status: Widowed    Spouse name: Norleen Shove   Number of children: 1   Years of education: Not on file   Highest education level: Bachelor's degree (e.g., BA, AB, BS)  Occupational History   Occupation: retired    Associate Professor: RETIRED  Tobacco Use   Smoking status: Never    Passive exposure: Never   Smokeless tobacco: Never  Vaping Use   Vaping status: Never Used  Substance and Sexual Activity   Alcohol use: Yes    Alcohol/week: 1.0 standard drink of alcohol    Types: 1 Glasses of wine per week    Comment: Rare   Drug use: No   Sexual activity: Yes    Birth control/protection: Surgical    Comment: 1st intercourse 83 yo-Fewer than 5 partners  Other Topics Concern   Not on file  Social History Narrative   Lives with husband.   Retired from working barrister's clerk at [employer]   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   Right handed   One story home   Social Drivers of Health   Financial Resource Strain: Low Risk  (01/24/2023)   Overall Financial Resource Strain (CARDIA)    Difficulty of Paying Living Expenses: Not hard at all  Food Insecurity: No Food Insecurity (01/24/2023)   Hunger Vital Sign    Worried About Running Out of Food in the Last Year:  Never true    Ran Out of Food in the Last Year: Never true  Transportation Needs: No Transportation Needs (01/24/2023)   PRAPARE - Administrator, Civil Service (Medical): No    Lack of Transportation (Non-Medical): No  Physical Activity: Sufficiently Active (01/24/2023)   Exercise Vital Sign    Days of Exercise per Week: 7 days    Minutes of  Exercise per Session: 30 min  Recent Concern: Physical Activity - Insufficiently Active (12/23/2022)   Exercise Vital Sign    Days of Exercise per Week: 7 days    Minutes of Exercise per Session: 10 min  Stress: No Stress Concern Present (01/24/2023)   [employer] of Occupational Health - Occupational Stress Questionnaire    Feeling of Stress : Not at all  Social Connections: Moderately Integrated (01/24/2023)   Social Connection and Isolation Panel [NHANES]    Frequency of Communication with Friends and Family: More than three times a week    Frequency of Social Gatherings with Friends and Family: More than three times a week    Attends Religious Services: More than 4 times per year    Active Member of [employer] or Organizations: Yes    Attends Banker Meetings: Patient declined    Marital Status: Widowed  Intimate Partner Violence: Not At Risk (10/04/2022)   Humiliation, Afraid, Rape, and Kick questionnaire    Fear of Current or Ex-Partner: No    Emotionally Abused: No    Physically Abused: No    Sexually Abused: No    Review of Systems:  All other review of systems negative except as mentioned in the HPI.  Physical Exam: Vital signs in last 24 hours: BP (!) 165/79   Pulse 64   Temp 97.7 F (36.5 C) (Temporal)   Resp 19   Ht 5' 4.5 (1.638 m)   Wt 193 lb (87.5 kg)   SpO2 100%   BMI 32.62 kg/m  General:   Alert, NAD Lungs:  Clear .   Heart:  Regular rate and rhythm Abdomen:  Soft, nontender and nondistended. Neuro/Psych:  Alert and cooperative. Normal mood and affect. A and O x 3  Reviewed labs,  radiology imaging, old records and pertinent past GI work up  Patient is appropriate for planned procedure(s) and anesthesia in an ambulatory setting   K. Veena Jyaire Koudelka , MD [phone]

## 2023-02-19 NOTE — Progress Notes (Signed)
 Pt's states no medical or surgical changes since previsit or office visit.

## 2023-02-20 ENCOUNTER — Telehealth: Payer: Self-pay

## 2023-02-20 ENCOUNTER — Ambulatory Visit (HOSPITAL_BASED_OUTPATIENT_CLINIC_OR_DEPARTMENT_OTHER)
Admission: RE | Admit: 2023-02-20 | Discharge: 2023-02-20 | Disposition: A | Discharge status: Home / Self Care | Payer: Medicare Other | Source: Ambulatory Visit | Attending: Obstetrics and Gynecology | Admitting: Obstetrics and Gynecology

## 2023-02-20 ENCOUNTER — Other Ambulatory Visit: Payer: Self-pay | Admitting: Family Medicine

## 2023-02-20 DIAGNOSIS — M8588 Other specified disorders of bone density and structure, other site: Secondary | ICD-10-CM

## 2023-02-20 NOTE — Telephone Encounter (Signed)
 Follow up call to pt, lm for pt to call if having any difficulty with normal activities or eating and drinking.  Also to call if any other questions or concerns.

## 2023-02-24 LAB — SURGICAL PATHOLOGY

## 2023-03-10 ENCOUNTER — Ambulatory Visit (HOSPITAL_BASED_OUTPATIENT_CLINIC_OR_DEPARTMENT_OTHER)
Admission: RE | Admit: 2023-03-10 | Discharge: 2023-03-10 | Disposition: A | Discharge status: Home / Self Care | Payer: Medicare Other | Source: Ambulatory Visit | Attending: Obstetrics and Gynecology | Admitting: Obstetrics and Gynecology

## 2023-03-10 DIAGNOSIS — Z780 Asymptomatic menopausal state: Secondary | ICD-10-CM | POA: Diagnosis not present

## 2023-03-10 DIAGNOSIS — M8588 Other specified disorders of bone density and structure, other site: Secondary | ICD-10-CM | POA: Insufficient documentation

## 2023-03-11 ENCOUNTER — Encounter: Payer: Self-pay | Admitting: Family Medicine

## 2023-03-11 ENCOUNTER — Encounter: Payer: Self-pay | Admitting: Obstetrics and Gynecology

## 2023-03-28 ENCOUNTER — Ambulatory Visit (INDEPENDENT_AMBULATORY_CARE_PROVIDER_SITE_OTHER): Payer: Medicare Other | Admitting: Family Medicine

## 2023-03-28 ENCOUNTER — Encounter: Payer: Self-pay | Admitting: Family Medicine

## 2023-03-28 VITALS — BP 158/68 | HR 73 | Temp 98.2°F | Wt 193.7 lb

## 2023-03-28 DIAGNOSIS — I10 Essential (primary) hypertension: Secondary | ICD-10-CM

## 2023-03-28 DIAGNOSIS — L121 Cicatricial pemphigoid: Secondary | ICD-10-CM | POA: Diagnosis not present

## 2023-03-28 NOTE — Progress Notes (Signed)
 Established Patient Office Visit  Subjective   Patient ID: Margaret Mitchell, female    DOB: [birth year]  Age: 83 y.o. MRN: [id number]  Chief Complaint  Patient presents with   Medical Management of Chronic Issues    HPI   [name] has history of GERD, hypertension, restless leg syndrome.  She has had some recurrent mouth sores and went to her dentist and subsequently referred to oral surgeon.  She had biopsy which indicated mucous membrane pemphigoid.  She has been treated with Valtrex and mouthwash containing prednisone.  She states that she has had some recurrent mouth "blisters "since about 2018.  These mostly involve the gums with occasional involvement of the buccal mucosa.  She has never had any vaginal involvement or anal involvement.  No eye symptoms.  This is a very rare autoimmune disorder.  She does have hypertension treated with amlodipine and on losartan.  Blood pressure up slightly today.  This has been followed by cardiology.  Home blood pressures have been consistently fairly well-controlled.  Has had some recent sensation of difficulty getting a full deep breath.  No chest pains.  No exertional symptoms.  Symptoms occur only at rest.  She is looking at moving to independent living facility called the [facility] hopefully by the summer.  She is currently on a waiting list.  She hopes to be less isolated  Past Medical History:  Diagnosis Date   Allergy, unspecified not elsewhere classified    Anxiety    Arthritis    Cervical dysplasia    prior to hysterectomy   GERD (gastroesophageal reflux disease)    Headache(784.0)    Hypertension    IBS (irritable bowel syndrome)    Osteopenia 12/2015   T score -1.1 FRAX 3.7%/0.4%   Pelvic adhesions    Pelvic pain in female    Sinus infection    Thyroid disease    Parathyroid cyst   Past Surgical History:  Procedure Laterality Date   ABDOMINAL HYSTERECTOMY  1983   TAH/BSO for pelvic pain and adhesions   APPENDECTOMY   1994   BREAST SURGERY     Biopsy-Benign   CATARACT EXTRACTION Bilateral 2013   COLPOSCOPY     FOOT SURGERY Bilateral    HAMMER TOE SURGERY Bilateral    PARATHYROID ADENOMA REMOVED  02/1980   ROTATOR CUFF REPAIR     X2   SHOULDER INJECTION  10/2011   TONSILLECTOMY     TUBAL LIGATION  1981   VESICOVAGINAL FISTULA CLOSURE W/ TAH      reports that she has never smoked. She has never been exposed to tobacco smoke. She has never used smokeless tobacco. She reports current alcohol use of about 1.0 standard drink of alcohol per week. She reports that she does not use drugs. family history includes Dementia in her mother; Diabetes in her maternal aunt, maternal grandmother, and mother; Heart disease in her maternal grandmother; Heart failure in her paternal grandmother; Hypertension in her maternal grandmother; Leukemia in her brother; Lung cancer in her father; Stroke in her maternal grandmother. Allergies  Allergen Reactions   Prednisone Other (See Comments)    REACTION: heart palpatations  In larger doses   Sulfa Antibiotics Swelling   Sulfasalazine Swelling   Sulfonamide Derivatives Swelling   Vioxx [Rofecoxib] Other (See Comments)    REACTION: mouth swelling (VIOXX)   Other Other (See Comments)    Seldane   Seldane [Terfenadine] Other (See Comments)    SELDANE    Review of Systems  Constitutional:  Negative for malaise/fatigue.  Eyes:  Negative for blurred vision.  Respiratory:  Negative for shortness of breath.   Cardiovascular:  Negative for chest pain.  Neurological:  Negative for dizziness, weakness and headaches.      Objective:     BP (!) 158/68 (BP Location: Left Arm, Cuff Size: Large)   Pulse 73   Temp 98.2 F (36.8 C) (Oral)   Wt 193 lb 11.2 oz (87.9 kg)   SpO2 98%   BMI 32.74 kg/m  BP Readings from Last 3 Encounters:  03/28/23 (!) 158/68  02/19/23 (!) 148/87  01/27/23 (!) 140/80   Wt Readings from Last 3 Encounters:  03/28/23 193 lb 11.2 oz (87.9 kg)   02/19/23 193 lb (87.5 kg)  01/27/23 197 lb 12.8 oz (89.7 kg)      Physical Exam Constitutional:      Appearance: She is well-developed.  HENT:     Mouth/Throat:     Comments: Right upper gingival area reveals somewhat oval area approximately 1 cm in length and half centimeter diameter of denuded epithelium with some superficial exudate. Eyes:     Pupils: Pupils are equal, round, and reactive to light.  Neck:     Thyroid: No thyromegaly.     Vascular: No JVD.  Cardiovascular:     Rate and Rhythm: Normal rate and regular rhythm.     Heart sounds:     No gallop.  Pulmonary:     Effort: Pulmonary effort is normal. No respiratory distress.     Breath sounds: Normal breath sounds. No wheezing or rales.  Musculoskeletal:     Cervical back: Neck supple.     Right lower leg: No edema.     Left lower leg: No edema.  Neurological:     Mental Status: She is alert.      No results found for any visits on 03/28/23.    The ASCVD Risk score (Arnett DK, et al., 2019) failed to calculate for the following reasons:   The 2019 ASCVD risk score is only valid for ages 45 to 70    Assessment & Plan:   #1 patient has at least 7-year history of recurrent mouth ulcers.  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  Currently followed by dentist and oral surgeon.  From the little reading we did today it looks like this is a fairly rare autoimmune disorder.  Will do further research and also look into possible referral to specialty center if we can find someone that has expertise in this area.  #2 hypertension.  Patient on amlodipine and olmesartan currently.  Blood pressure up slightly today but consistently better at home.  Continue close monitoring.  Continue low-sodium diet.  Be in touch if consistently greater than 140 systolic  Margaret Peat, MD

## 2023-03-28 NOTE — Patient Instructions (Signed)
 Monitor blood pressure and be in touch if consistently > 140/90.

## 2023-04-01 ENCOUNTER — Other Ambulatory Visit: Payer: Self-pay | Admitting: Family Medicine

## 2023-04-02 ENCOUNTER — Encounter: Payer: Self-pay | Admitting: Gastroenterology

## 2023-04-23 ENCOUNTER — Other Ambulatory Visit: Payer: Self-pay | Admitting: Gastroenterology

## 2023-05-13 ENCOUNTER — Other Ambulatory Visit: Payer: Self-pay

## 2023-06-18 ENCOUNTER — Encounter: Payer: Self-pay | Admitting: Cardiovascular Disease

## 2023-07-07 ENCOUNTER — Ambulatory Visit (INDEPENDENT_AMBULATORY_CARE_PROVIDER_SITE_OTHER): Admitting: Family Medicine

## 2023-07-07 ENCOUNTER — Ambulatory Visit: Payer: Self-pay | Admitting: Family Medicine

## 2023-07-07 VITALS — BP 170/80 | HR 73 | Temp 97.7°F | Wt 193.1 lb

## 2023-07-07 DIAGNOSIS — H8112 Benign paroxysmal vertigo, left ear: Secondary | ICD-10-CM | POA: Diagnosis not present

## 2023-07-07 DIAGNOSIS — I10 Essential (primary) hypertension: Secondary | ICD-10-CM

## 2023-07-07 DIAGNOSIS — R252 Cramp and spasm: Secondary | ICD-10-CM

## 2023-07-07 LAB — BASIC METABOLIC PANEL WITH GFR
BUN: 10 mg/dL (ref 6–23)
CO2: 29 meq/L (ref 19–32)
Calcium: 9.5 mg/dL (ref 8.4–10.5)
Chloride: 100 meq/L (ref 96–112)
Creatinine, Ser: 0.77 mg/dL (ref 0.40–1.20)
GFR: 72.49 mL/min (ref >60.00)
Glucose, Bld: 95 mg/dL (ref 70–99)
Potassium: 3.7 meq/L (ref 3.5–5.1)
Sodium: 136 meq/L (ref 135–145)

## 2023-07-07 LAB — MAGNESIUM: Magnesium: 1.9 mg/dL (ref 1.5–2.5)

## 2023-07-07 NOTE — Patient Instructions (Signed)
 Monitor blood pressure and be in touch if consistently > 130/80.   Let me know if vertigo symptoms not improving over the next few days and we can look at vestibular rehab.

## 2023-07-07 NOTE — Progress Notes (Signed)
 Established Patient Office Visit  Subjective   Patient ID: Margaret Mitchell, female    DOB: 10/17/1940  Age: 83 y.o. MRN: [id number]  Chief Complaint  Patient presents with   Medical Management of Chronic Issues    HPI   Margaret Mitchell is seen today for medical follow-up and for acute issue of vertigo which started this past Friday.  She states she has had little bit of sensation of head fullness for couple weeks but denies any true headaches.  Friday she first noticed some vertigo symptoms which are worse in the morning and somewhat intermittent.  She has had similar vertigo in the past though not for quite some time.  She has not had any vomiting.  No ataxia.  No focal weakness.  No speech changes.  Still able to get around and carry out her day-to-day activities.  She has also had some recent increased leg cramps both calves and thighs.  She feels like she is hydrating fairly well.  Drinks about 2 L/day.  Does drink some coffee in the mornings.  Also takes magnesium  supplement.  She has hypertension currently treated with Toprol -XL, Benicar , and amlodipine .  Compliant with therapy.  Home blood pressures are consistently controlled 120s to 130s systolic.  She brings in a log of readings today.  Past Medical History:  Diagnosis Date   Allergy, unspecified not elsewhere classified    Anxiety    Arthritis    Cervical dysplasia    prior to hysterectomy   GERD (gastroesophageal reflux disease)    Headache(784.0)    Hypertension    IBS (irritable bowel syndrome)    Osteopenia 12/2015   T score -1.1 FRAX 3.7%/0.4%   Pelvic adhesions    Pelvic pain in female    Sinus infection    Thyroid  disease    Parathyroid cyst   Past Surgical History:  Procedure Laterality Date   ABDOMINAL HYSTERECTOMY  1983   TAH/BSO for pelvic pain and adhesions   APPENDECTOMY  1994   BREAST SURGERY     Biopsy-Benign   CATARACT EXTRACTION Bilateral 2013   COLPOSCOPY     FOOT SURGERY Bilateral    HAMMER  TOE SURGERY Bilateral    PARATHYROID ADENOMA REMOVED  02/1980   ROTATOR CUFF REPAIR     X2   SHOULDER INJECTION  10/2011   TONSILLECTOMY     TUBAL LIGATION  1981   VESICOVAGINAL FISTULA CLOSURE W/ TAH      reports that she has never smoked. She has never been exposed to tobacco smoke. She has never used smokeless tobacco. She reports current alcohol use of about 1.0 standard drink of alcohol per week. She reports that she does not use drugs. family history includes Dementia in her mother; Diabetes in her maternal aunt, maternal grandmother, and mother; Heart disease in her maternal grandmother; Heart failure in her paternal grandmother; Hypertension in her maternal grandmother; Leukemia in her brother; Lung cancer in her father; Stroke in her maternal grandmother. Allergies  Allergen Reactions   Prednisone Other (See Comments)    REACTION: heart palpatations  In larger doses   Sulfa  Antibiotics Swelling   Sulfasalazine Swelling   Sulfonamide Derivatives Swelling   Vioxx [Rofecoxib] Other (See Comments)    REACTION: mouth swelling (VIOXX)   Other Other (See Comments)    Seldane   Seldane [Terfenadine] Other (See Comments)    SELDANE    Review of Systems  Constitutional:  Negative for chills, fever and malaise/fatigue.  Eyes:  Negative for blurred vision.  Respiratory:  Negative for shortness of breath.   Cardiovascular:  Negative for chest pain.  Neurological:  Positive for dizziness. Negative for speech change, focal weakness, seizures, loss of consciousness, weakness and headaches.      Objective:     BP (!) 180/86 (BP Location: Left Arm, Patient Position: Sitting, Cuff Size: Normal)   Pulse 73   Temp 97.7 F (36.5 C) (Oral)   Wt 193 lb 1.6 oz (87.6 kg)   SpO2 98%   BMI 32.63 kg/m  BP Readings from Last 3 Encounters:  07/07/23 (!) 180/86  03/28/23 (!) 158/68  02/19/23 (!) 148/87   Wt Readings from Last 3 Encounters:  07/07/23 193 lb 1.6 oz (87.6 kg)  03/28/23 193  lb 11.2 oz (87.9 kg)  02/19/23 193 lb (87.5 kg)      Physical Exam Vitals reviewed.  Constitutional:      General: She is not in acute distress.    Appearance: She is well-developed. She is not ill-appearing.   Eyes:     Pupils: Pupils are equal, round, and reactive to light.   Neck:     Thyroid : No thyromegaly.     Vascular: No JVD.   Cardiovascular:     Rate and Rhythm: Normal rate and regular rhythm.     Heart sounds:     No gallop.  Pulmonary:     Effort: Pulmonary effort is normal. No respiratory distress.     Breath sounds: Normal breath sounds. No wheezing or rales.   Musculoskeletal:     Cervical back: Neck supple.   Neurological:     General: No focal deficit present.     Mental Status: She is alert and oriented to person, place, and time.     Cranial Nerves: No cranial nerve deficit.     Motor: No weakness.     Gait: Gait normal.     Comments: She does have reproducible vertigo when head is turned 45 degrees to the left and going from seated to supine and then back sitting again     No results found for any visits on 07/07/23.  Last CBC Lab Results  Component Value Date   WBC 8.8 03/12/2021   HGB 13.4 03/12/2021   HCT 41.1 03/12/2021   MCV 95.1 03/12/2021   MCH 31.0 03/12/2021   RDW 12.3 03/12/2021   PLT 347 03/12/2021   Last metabolic panel Lab Results  Component Value Date   GLUCOSE 80 12/27/2022   NA 144 12/27/2022   K 3.5 12/27/2022   CL 107 12/27/2022   CO2 27 12/27/2022   BUN 11 12/27/2022   CREATININE 0.87 12/27/2022   GFR 62.84 12/27/2022   CALCIUM 8.8 12/27/2022   PHOS 4.0 06/16/2013   PROT 7.5 07/03/2021   ALBUMIN 4.2 01/11/2020   LABGLOB 2.6 04/08/2019   AGRATIO 1.7 04/08/2019   BILITOT 0.3 01/11/2020   ALKPHOS 89 01/11/2020   AST 21 01/11/2020   ALT 16 01/11/2020   ANIONGAP 8 03/12/2021   Last lipids Lab Results  Component Value Date   CHOL 178 04/08/2019   HDL 72 04/08/2019   LDLCALC 83 04/08/2019   LDLDIRECT  104.8 05/07/2010   TRIG 137 04/08/2019   CHOLHDL 2.5 04/08/2019   Last hemoglobin A1c Lab Results  Component Value Date   HGBA1C 6.1 05/03/2015   Last thyroid  functions Lab Results  Component Value Date   TSH 3.55 07/03/2021      The ASCVD Risk score (  Arnett DK, et al., 2019) failed to calculate for the following reasons:   The 2019 ASCVD risk score is only valid for ages 7 to 27    Assessment & Plan:   #1 benign peripheral positional vertigo left.  Symptoms reproducible on exam.  No other worrisome physical findings.  We discussed limited role of medications for treating this.  She declines.  Also offered referral for vestibular rehab but she would like to give this a few more days to see if this resolves on its own.  Consider Epley maneuvers  #2 muscle cramps involving legs bilaterally.  Check basic metabolic panel and magnesium  level.  Stressed importance of adequate hydration.  She is considering over-the-counter turmeric supplement  #3 hypertension.  Poorly controlled with today's reading.  Repeat blood pressure did come down to 170/80 but still substantially elevated.  This is different for her consistently well-controlled home readings.  She suspects whitecoat syndrome.  We recommended close home monitoring and be in touch if greater than 130/80 consistently.  Watch sodium intake.  She did bring her cuff recently to compare with ours and we had very similar readings   Return in about 3 months (around 10/07/2023).    Wolm Scarlet, MD

## 2023-07-14 ENCOUNTER — Encounter: Payer: Self-pay | Admitting: Family Medicine

## 2023-07-14 DIAGNOSIS — G629 Polyneuropathy, unspecified: Secondary | ICD-10-CM

## 2023-07-14 DIAGNOSIS — Z833 Family history of diabetes mellitus: Secondary | ICD-10-CM

## 2023-07-14 DIAGNOSIS — R739 Hyperglycemia, unspecified: Secondary | ICD-10-CM

## 2023-07-14 DIAGNOSIS — R252 Cramp and spasm: Secondary | ICD-10-CM

## 2023-07-15 ENCOUNTER — Other Ambulatory Visit

## 2023-07-15 ENCOUNTER — Ambulatory Visit: Payer: Self-pay | Admitting: Family Medicine

## 2023-07-15 DIAGNOSIS — R739 Hyperglycemia, unspecified: Secondary | ICD-10-CM

## 2023-07-15 DIAGNOSIS — R252 Cramp and spasm: Secondary | ICD-10-CM | POA: Diagnosis not present

## 2023-07-15 LAB — HEMOGLOBIN A1C: Hgb A1c MFr Bld: 6 % (ref 4.6–6.5)

## 2023-07-15 LAB — VITAMIN B12: Vitamin B-12: 445 pg/mL (ref 211–911)

## 2023-07-15 LAB — TSH: TSH: 3 u[IU]/mL (ref 0.35–5.50)

## 2023-07-21 ENCOUNTER — Other Ambulatory Visit: Payer: Self-pay | Admitting: Family Medicine

## 2023-07-21 DIAGNOSIS — D472 Monoclonal gammopathy: Secondary | ICD-10-CM

## 2023-07-21 LAB — MULTIPLE MYELOMA PANEL, SERUM
Albumin SerPl Elph-Mcnc: 3.6 g/dL (ref 2.9–4.4)
Albumin/Glob SerPl: 1.1 (ref 0.7–1.7)
Alpha 1: 0.2 g/dL (ref 0.0–0.4)
Alpha2 Glob SerPl Elph-Mcnc: 0.8 g/dL (ref 0.4–1.0)
B-Globulin SerPl Elph-Mcnc: 1.2 g/dL (ref 0.7–1.3)
Gamma Glob SerPl Elph-Mcnc: 1.3 g/dL (ref 0.4–1.8)
Globulin, Total: 3.5 g/dL (ref 2.2–3.9)
IgA/Immunoglobulin A, Serum: 150 mg/dL (ref 64–422)
IgG (Immunoglobin G), Serum: 1596 mg/dL (ref 586–1602)
IgM (Immunoglobulin M), Srm: 58 mg/dL (ref 26–217)
M Protein SerPl Elph-Mcnc: 0.5 g/dL — ABNORMAL HIGH
Total Protein: 7.1 g/dL (ref 6.0–8.5)

## 2023-07-21 LAB — SPECIMEN STATUS REPORT

## 2023-07-21 NOTE — Progress Notes (Signed)
 Myeloma panel- increased igG monoclonal protein.  Will check additional labs.  If abnormal, hematology/oncology consult.  Wolm LELON Scarlet MD [facility]

## 2023-07-22 ENCOUNTER — Ambulatory Visit: Payer: Self-pay | Admitting: Family Medicine

## 2023-07-22 ENCOUNTER — Other Ambulatory Visit (INDEPENDENT_AMBULATORY_CARE_PROVIDER_SITE_OTHER)

## 2023-07-22 ENCOUNTER — Other Ambulatory Visit: Payer: Self-pay | Admitting: Physician Assistant

## 2023-07-22 ENCOUNTER — Other Ambulatory Visit: Payer: Self-pay | Admitting: Family Medicine

## 2023-07-22 DIAGNOSIS — D472 Monoclonal gammopathy: Secondary | ICD-10-CM

## 2023-07-22 LAB — COMPREHENSIVE METABOLIC PANEL WITH GFR
ALT: 16 U/L (ref 0–35)
AST: 19 U/L (ref 0–37)
Albumin: 4 g/dL (ref 3.5–5.2)
Alkaline Phosphatase: 74 U/L (ref 39–117)
BUN: 13 mg/dL (ref 6–23)
CO2: 28 meq/L (ref 19–32)
Calcium: 8.7 mg/dL (ref 8.4–10.5)
Chloride: 102 meq/L (ref 96–112)
Creatinine, Ser: 0.86 mg/dL (ref 0.40–1.20)
GFR: 63.46 mL/min (ref >60.00)
Glucose, Bld: 81 mg/dL (ref 70–99)
Potassium: 4 meq/L (ref 3.5–5.1)
Sodium: 137 meq/L (ref 135–145)
Total Bilirubin: 0.4 mg/dL (ref 0.2–1.2)
Total Protein: 7.4 g/dL (ref 6.0–8.3)

## 2023-07-22 LAB — CBC WITH DIFFERENTIAL/PLATELET
Basophils Absolute: 0 K/uL (ref 0.0–0.1)
Basophils Relative: 0.3 % (ref 0.0–3.0)
Eosinophils Absolute: 0.1 K/uL (ref 0.0–0.7)
Eosinophils Relative: 0.6 % (ref 0.0–5.0)
HCT: 38.4 % (ref 36.0–46.0)
Hemoglobin: 12.7 g/dL (ref 12.0–15.0)
Lymphocytes Relative: 39.1 % (ref 12.0–46.0)
Lymphs Abs: 3.8 K/uL (ref 0.7–4.0)
MCHC: 33 g/dL (ref 30.0–36.0)
MCV: 93.7 fl (ref 78.0–100.0)
Monocytes Absolute: 0.9 K/uL (ref 0.1–1.0)
Monocytes Relative: 9.1 % (ref 3.0–12.0)
Neutro Abs: 5 K/uL (ref 1.4–7.7)
Neutrophils Relative %: 50.9 % (ref 43.0–77.0)
Platelets: 354 K/uL (ref 150.0–400.0)
RBC: 4.09 Mil/uL (ref 3.87–5.11)
RDW: 13.6 % (ref 11.5–15.5)
WBC: 9.8 K/uL (ref 4.0–10.5)

## 2023-07-22 LAB — SEDIMENTATION RATE: Sed Rate: 25 mm/h (ref 0–30)

## 2023-07-23 LAB — UPEP/UIFE/LIGHT CHAINS/TP, 24-HR UR

## 2023-07-24 DIAGNOSIS — D472 Monoclonal gammopathy: Secondary | ICD-10-CM | POA: Diagnosis not present

## 2023-07-24 LAB — PE AND FLC, SERUM
A/G Ratio: 1.2 (ref 0.7–1.7)
Albumin ELP: 3.8 g/dL (ref 2.9–4.4)
Alpha 1: 0.1 g/dL (ref 0.0–0.4)
Alpha 2: 0.7 g/dL (ref 0.4–1.0)
Beta: 0.9 g/dL (ref 0.7–1.3)
Gamma Globulin: 1.3 g/dL (ref 0.4–1.8)
Globulin, Total: 3.1 g/dL (ref 2.2–3.9)
Ig Kappa Free Light Chain: 162.6 mg/L — AB (ref 3.3–19.4)
Ig Lambda Free Light Chain: 16.4 mg/L (ref 5.7–26.3)
KAPPA/LAMBDA RATIO: 9.91 — AB (ref 0.26–1.65)
M-Spike, %: 0.8 g/dL — AB
Total Protein: 6.9 g/dL (ref 6.0–8.5)

## 2023-07-24 LAB — UPEP/UIFE/LIGHT CHAINS/TP, 24-HR UR

## 2023-07-25 NOTE — Addendum Note (Signed)
 Addended by: METTA KRISTEN CROME on: 07/25/2023 11:37 AM   Modules accepted: Orders

## 2023-07-28 LAB — UPEP/UIFE/LIGHT CHAINS/TP, 24-HR UR

## 2023-07-30 LAB — UPEP/UIFE/LIGHT CHAINS/TP, 24-HR UR
Free Lambda Lt Chains,Ur: 0.98 mg/L (ref 0.27–15.21)
Kappa/Lambda Ratio,U: 4.78 (ref 1.83–14.26)
NOTE:: 4.68 mg/L (ref 1.17–86.46)
Protein, 24H Urine: 33 mg/(24.h) (ref 30–150)
Protein, Ur: 5.7 mg/dL

## 2023-08-04 ENCOUNTER — Other Ambulatory Visit (HOSPITAL_COMMUNITY): Payer: Self-pay

## 2023-08-19 ENCOUNTER — Other Ambulatory Visit: Payer: Self-pay | Admitting: Family Medicine

## 2023-08-29 ENCOUNTER — Other Ambulatory Visit: Payer: Self-pay | Admitting: Medical Oncology

## 2023-08-29 DIAGNOSIS — D472 Monoclonal gammopathy: Secondary | ICD-10-CM

## 2023-08-29 NOTE — Progress Notes (Signed)
 Lab orders entered

## 2023-09-01 ENCOUNTER — Inpatient Hospital Stay: Attending: Internal Medicine | Admitting: Internal Medicine

## 2023-09-01 ENCOUNTER — Inpatient Hospital Stay

## 2023-09-01 VITALS — BP 151/72 | HR 66 | Temp 97.9°F | Resp 17 | Ht 64.5 in | Wt 197.0 lb

## 2023-09-01 DIAGNOSIS — Z888 Allergy status to other drugs, medicaments and biological substances status: Secondary | ICD-10-CM | POA: Diagnosis not present

## 2023-09-01 DIAGNOSIS — Z90722 Acquired absence of ovaries, bilateral: Secondary | ICD-10-CM | POA: Insufficient documentation

## 2023-09-01 DIAGNOSIS — R202 Paresthesia of skin: Secondary | ICD-10-CM | POA: Diagnosis not present

## 2023-09-01 DIAGNOSIS — Z818 Family history of other mental and behavioral disorders: Secondary | ICD-10-CM | POA: Insufficient documentation

## 2023-09-01 DIAGNOSIS — K589 Irritable bowel syndrome without diarrhea: Secondary | ICD-10-CM | POA: Diagnosis not present

## 2023-09-01 DIAGNOSIS — Z801 Family history of malignant neoplasm of trachea, bronchus and lung: Secondary | ICD-10-CM | POA: Diagnosis not present

## 2023-09-01 DIAGNOSIS — Z833 Family history of diabetes mellitus: Secondary | ICD-10-CM | POA: Diagnosis not present

## 2023-09-01 DIAGNOSIS — D472 Monoclonal gammopathy: Secondary | ICD-10-CM | POA: Diagnosis not present

## 2023-09-01 DIAGNOSIS — E079 Disorder of thyroid, unspecified: Secondary | ICD-10-CM

## 2023-09-01 DIAGNOSIS — K219 Gastro-esophageal reflux disease without esophagitis: Secondary | ICD-10-CM | POA: Insufficient documentation

## 2023-09-01 DIAGNOSIS — Z90710 Acquired absence of both cervix and uterus: Secondary | ICD-10-CM | POA: Insufficient documentation

## 2023-09-01 DIAGNOSIS — Z8249 Family history of ischemic heart disease and other diseases of the circulatory system: Secondary | ICD-10-CM | POA: Diagnosis not present

## 2023-09-01 DIAGNOSIS — Z882 Allergy status to sulfonamides status: Secondary | ICD-10-CM | POA: Insufficient documentation

## 2023-09-01 DIAGNOSIS — G629 Polyneuropathy, unspecified: Secondary | ICD-10-CM | POA: Insufficient documentation

## 2023-09-01 DIAGNOSIS — F419 Anxiety disorder, unspecified: Secondary | ICD-10-CM

## 2023-09-01 DIAGNOSIS — Z823 Family history of stroke: Secondary | ICD-10-CM | POA: Diagnosis not present

## 2023-09-01 DIAGNOSIS — M1990 Unspecified osteoarthritis, unspecified site: Secondary | ICD-10-CM | POA: Insufficient documentation

## 2023-09-01 DIAGNOSIS — Z79899 Other long term (current) drug therapy: Secondary | ICD-10-CM | POA: Diagnosis not present

## 2023-09-01 DIAGNOSIS — Z806 Family history of leukemia: Secondary | ICD-10-CM | POA: Diagnosis not present

## 2023-09-01 DIAGNOSIS — M8580 Other specified disorders of bone density and structure, unspecified site: Secondary | ICD-10-CM | POA: Diagnosis not present

## 2023-09-01 DIAGNOSIS — I10 Essential (primary) hypertension: Secondary | ICD-10-CM | POA: Insufficient documentation

## 2023-09-01 DIAGNOSIS — Z9049 Acquired absence of other specified parts of digestive tract: Secondary | ICD-10-CM | POA: Diagnosis not present

## 2023-09-01 DIAGNOSIS — R5383 Other fatigue: Secondary | ICD-10-CM | POA: Diagnosis not present

## 2023-09-01 LAB — CBC WITH DIFFERENTIAL (CANCER CENTER ONLY)
Abs Immature Granulocytes: 0.01 K/uL (ref 0.00–0.07)
Basophils Absolute: 0 K/uL (ref 0.0–0.1)
Basophils Relative: 0 %
Eosinophils Absolute: 0.2 K/uL (ref 0.0–0.5)
Eosinophils Relative: 2 %
HCT: 38.9 % (ref 36.0–46.0)
Hemoglobin: 12.8 g/dL (ref 12.0–15.0)
Immature Granulocytes: 0 %
Lymphocytes Relative: 49 %
Lymphs Abs: 3.5 K/uL (ref 0.7–4.0)
MCH: 30.9 pg (ref 26.0–34.0)
MCHC: 32.9 g/dL (ref 30.0–36.0)
MCV: 94 fL (ref 80.0–100.0)
Monocytes Absolute: 0.7 K/uL (ref 0.1–1.0)
Monocytes Relative: 9 %
Neutro Abs: 2.9 K/uL (ref 1.7–7.7)
Neutrophils Relative %: 40 %
Platelet Count: 322 K/uL (ref 150–400)
RBC: 4.14 MIL/uL (ref 3.87–5.11)
RDW: 12.2 % (ref 11.5–15.5)
WBC Count: 7.2 K/uL (ref 4.0–10.5)
nRBC: 0 % (ref 0.0–0.2)

## 2023-09-01 LAB — CMP (CANCER CENTER ONLY)
ALT: 15 U/L (ref 0–44)
AST: 21 U/L (ref 15–41)
Albumin: 3.9 g/dL (ref 3.5–5.0)
Alkaline Phosphatase: 80 U/L (ref 38–126)
Anion gap: 6 (ref 5–15)
BUN: 9 mg/dL (ref 8–23)
CO2: 27 mmol/L (ref 22–32)
Calcium: 8.6 mg/dL — ABNORMAL LOW (ref 8.9–10.3)
Chloride: 108 mmol/L (ref 98–111)
Creatinine: 0.88 mg/dL (ref 0.44–1.00)
GFR, Estimated: >60 mL/min (ref >60)
Glucose, Bld: 82 mg/dL (ref 70–99)
Potassium: 3.5 mmol/L (ref 3.5–5.1)
Sodium: 141 mmol/L (ref 135–145)
Total Bilirubin: 0.4 mg/dL (ref 0.0–1.2)
Total Protein: 7.3 g/dL (ref 6.5–8.1)

## 2023-09-01 NOTE — Progress Notes (Signed)
 [facility] [phone]   [phone]  CONSULT NOTE  REFERRING PHYSICIAN: Dr. Wolm Scarlet  REASON FOR CONSULTATION:  83 years old white female with monoclonal gammopathy  HPI Margaret Mitchell is a 83 y.o. female.   HPI  Discussed the use of AI scribe software for clinical note transcription with the patient, who gave verbal consent to proceed.  History of Present Illness Margaret Mitchell is an 83 year old female with monoclonal gammopathy who presents for evaluation of abnormal protein in her blood. She was referred by Dr. Wolm Lovings for evaluation of monoclonal gammopathy.  She experiences numbness in her legs, particularly in the right leg, which prompted testing for neuropathy. Initial lab work on July 1st revealed an M spike of 0.5. Subsequent tests on July 8th showed an increase in the free kappa light chain to 162.6, with a normal lambda level of 16.4 and an M spike of 0.8. Despite these findings, there is no improvement in her neuropathy symptoms.  No fatigue, weakness, chest pain, shortness of breath, cough, nausea, vomiting, diarrhea, headaches, weight loss, fever, or chills. Her past medical history significant for anxiety, osteoarthritis, GERD, hypertension, irritable bowel syndrome, parathyroid disorder, cervical disc disease. Family history significant for mother with dementia and diabetes mellitus.  Father had lung cancer and brother had leukemia. The patient is a widow and has 1 adopted son.  She used to work in [employer].  She has no history for smoking, alcohol or drug abuse.    Past Medical History:  Diagnosis Date   Allergy, unspecified not elsewhere classified    Anxiety    Arthritis    Cervical dysplasia    prior to hysterectomy   GERD (gastroesophageal reflux disease)    Headache(784.0)    Hypertension    IBS (irritable bowel syndrome)    Osteopenia 12/2015   T score -1.1 FRAX 3.7%/0.4%   Pelvic  adhesions    Pelvic pain in female    Sinus infection    Thyroid  disease    Parathyroid cyst      Past Surgical History:  Procedure Laterality Date   ABDOMINAL HYSTERECTOMY  1983   TAH/BSO for pelvic pain and adhesions   APPENDECTOMY  1994   BREAST SURGERY     Biopsy-Benign   CATARACT EXTRACTION Bilateral 2013   COLPOSCOPY     FOOT SURGERY Bilateral    HAMMER TOE SURGERY Bilateral    PARATHYROID ADENOMA REMOVED  02/1980   ROTATOR CUFF REPAIR     X2   SHOULDER INJECTION  10/2011   TONSILLECTOMY     TUBAL LIGATION  1981   VESICOVAGINAL FISTULA CLOSURE W/ TAH      Family History  Problem Relation Age of Onset   Dementia Mother    Diabetes Mother    Lung cancer Father    Leukemia Brother    Diabetes Maternal Aunt    Hypertension Maternal Grandmother    Heart disease Maternal Grandmother    Diabetes Maternal Grandmother    Stroke Maternal Grandmother    Heart failure Paternal Grandmother    Colon cancer Neg Hx    Esophageal cancer Neg Hx    Stomach cancer Neg Hx    Rectal cancer Neg Hx     Social History Social History   Tobacco Use   Smoking status: Never    Passive exposure: Never   Smokeless tobacco: Never  Vaping Use   Vaping status: Never Used  Substance Use  Topics   Alcohol use: Yes    Alcohol/week: 1.0 standard drink of alcohol    Types: 1 Glasses of wine per week    Comment: Rare   Drug use: No    Allergies  Allergen Reactions   Prednisone Other (See Comments)    REACTION: heart palpatations  In larger doses   Sulfa  Antibiotics Swelling   Sulfasalazine Swelling   Sulfonamide Derivatives Swelling   Vioxx [Rofecoxib] Other (See Comments)    REACTION: mouth swelling (VIOXX)   Other Other (See Comments)    Seldane   Seldane [Terfenadine] Other (See Comments)    SELDANE    Current Outpatient Medications  Medication Sig Dispense Refill   amLODipine  (NORVASC ) 5 MG tablet Take 1 tablet (5 mg total) by mouth daily. 180 tablet 3   fluticasone   (FLONASE ) 50 MCG/ACT nasal spray Place 2 sprays into both nostrils daily. 48 g 3   metoprolol  succinate (TOPROL -XL) 50 MG 24 hr tablet TAKE 1 TABLET DAILY WITH OR IMMEDIATELY FOLLOWING A MEAL 90 tablet 1   Multiple Vitamin (MULTIVITAMIN) tablet Take 1 tablet by mouth daily.     olmesartan  (BENICAR ) 20 MG tablet Take 1 tablet (20 mg total) by mouth daily. 90 tablet 3   omeprazole  (PRILOSEC) 20 MG capsule TAKE 1 CAPSULE(20 MG) BY MOUTH EVERY MORNING 30 capsule 2   valACYclovir  (VALTREX ) 1000 MG tablet Take 4,000 mg by mouth once. prn     No current facility-administered medications for this visit.    Review of Systems  Constitutional: positive for fatigue Eyes: negative Ears, nose, mouth, throat, and face: negative Respiratory: negative Cardiovascular: negative Gastrointestinal: negative Genitourinary:negative Integument/breast: negative Hematologic/lymphatic: negative Musculoskeletal:negative Neurological: positive for paresthesia Behavioral/Psych: negative Endocrine: negative Allergic/Immunologic: negative  Physical Exam  MJO:jozmu, healthy, no distress, well nourished, and well developed SKIN: skin color, texture, turgor are normal, no rashes or significant lesions HEAD: Normocephalic, No masses, lesions, tenderness or abnormalities EYES: normal, PERRLA, Conjunctiva are pink and non-injected EARS: External ears normal, Canals clear OROPHARYNX:no exudate, no erythema, and lips, buccal mucosa, and tongue normal  NECK: supple, no adenopathy, no JVD LYMPH:  no palpable lymphadenopathy, no hepatosplenomegaly BREAST:not examined LUNGS: clear to auscultation , and palpation HEART: regular rate & rhythm, no murmurs, and no gallops ABDOMEN:abdomen soft, non-tender, and normal bowel sounds BACK: Back symmetric, no curvature., No CVA tenderness EXTREMITIES:no joint deformities, effusion, or inflammation, no edema  NEURO: alert & oriented x 3 with fluent speech, no focal motor/sensory  deficits  PERFORMANCE STATUS: ECOG 1  LABORATORY DATA: Lab Results  Component Value Date   WBC 7.2 09/01/2023   HGB 12.8 09/01/2023   HCT 38.9 09/01/2023   MCV 94.0 09/01/2023   PLT 322 09/01/2023      Chemistry      Component Value Date/Time   NA 141 09/01/2023 1049   NA 141 04/08/2019 0959   K 3.5 09/01/2023 1049   CL 108 09/01/2023 1049   CO2 27 09/01/2023 1049   BUN 9 09/01/2023 1049   BUN 13 04/08/2019 0959   CREATININE 0.88 09/01/2023 1049      Component Value Date/Time   CALCIUM 8.6 (L) 09/01/2023 1049   ALKPHOS 80 09/01/2023 1049   AST 21 09/01/2023 1049   ALT 15 09/01/2023 1049   BILITOT 0.4 09/01/2023 1049       RADIOGRAPHIC STUDIES: No results found.  ASSESSMENT: This is a very pleasant 83 years old African-American female with monoclonal gammopathy of undetermined significance.  PLAN:  Assessment and  Plan Assessment & Plan Monoclonal gammopathy of undetermined significance (MGUS) MGUS identified with an M spike of 0.8 on serum protein electrophoresis. Free kappa light chain elevated at 162.6 with a normal lambda level and a ratio of 9.91. Concern for potential progression to multiple myeloma due to abnormal protein levels. Discussed the possibility of MGUS converting to multiple myeloma at a rate of 1% per year. Differential diagnosis includes multiple myeloma, necessitating further investigation. - Order bone marrow biopsy to assess plasma cell levels, to be performed at [facility] by interventional radiology with sedation. - Schedule follow-up appointment in two weeks to discuss biopsy results and further management.  Peripheral neuropathy, right leg predominant Persistent peripheral neuropathy with right leg predominance. No improvement reported. Neuropathy initially investigated due to numbness in the legs, leading to the discovery of abnormal protein levels. She was advised to call immediately if she has any other concerning symptoms in the  interval.  The patient voices understanding of current disease status and treatment options and is in agreement with the current care plan.  All questions were answered. The patient knows to call the clinic with any problems, questions or concerns. We can certainly see the patient much sooner if necessary.  Thank you so much for allowing me to participate in the care of Margaret Mitchell. I will continue to follow up the patient with you and assist in her care.  The total time spent in the appointment was 60 minutes including review of chart and various tests results, discussions about plan of care and coordination of care plan .   Disclaimer: This note was dictated with voice recognition software. Similar sounding words can inadvertently be transcribed and may not be corrected upon review.   Sherrod MARLA Sherrod September 01, 2023, 12:10 PM

## 2023-09-04 ENCOUNTER — Encounter: Payer: Self-pay | Admitting: Family Medicine

## 2023-09-04 DIAGNOSIS — H04123 Dry eye syndrome of bilateral lacrimal glands: Secondary | ICD-10-CM | POA: Diagnosis not present

## 2023-09-04 DIAGNOSIS — H35373 Puckering of macula, bilateral: Secondary | ICD-10-CM | POA: Diagnosis not present

## 2023-09-04 DIAGNOSIS — H31091 Other chorioretinal scars, right eye: Secondary | ICD-10-CM | POA: Diagnosis not present

## 2023-09-04 DIAGNOSIS — Z961 Presence of intraocular lens: Secondary | ICD-10-CM | POA: Diagnosis not present

## 2023-09-04 DIAGNOSIS — H26493 Other secondary cataract, bilateral: Secondary | ICD-10-CM | POA: Diagnosis not present

## 2023-09-04 NOTE — Telephone Encounter (Signed)
Patient has been scheduled for an appointment to discuss.

## 2023-09-08 ENCOUNTER — Ambulatory Visit (INDEPENDENT_AMBULATORY_CARE_PROVIDER_SITE_OTHER): Admitting: Family Medicine

## 2023-09-08 ENCOUNTER — Encounter: Payer: Self-pay | Admitting: Family Medicine

## 2023-09-08 VITALS — BP 152/70 | HR 71 | Temp 98.2°F | Wt 196.7 lb

## 2023-09-08 DIAGNOSIS — D472 Monoclonal gammopathy: Secondary | ICD-10-CM | POA: Diagnosis not present

## 2023-09-08 DIAGNOSIS — I10 Essential (primary) hypertension: Secondary | ICD-10-CM

## 2023-09-08 LAB — MULTIPLE MYELOMA PANEL, SERUM
Albumin SerPl Elph-Mcnc: 3.3 g/dL (ref 2.9–4.4)
Albumin/Glob SerPl: 1 (ref 0.7–1.7)
Alpha 1: 0.2 g/dL (ref 0.0–0.4)
Alpha2 Glob SerPl Elph-Mcnc: 0.8 g/dL (ref 0.4–1.0)
B-Globulin SerPl Elph-Mcnc: 1 g/dL (ref 0.7–1.3)
Gamma Glob SerPl Elph-Mcnc: 1.5 g/dL (ref 0.4–1.8)
Globulin, Total: 3.5 g/dL (ref 2.2–3.9)
IgA: 150 mg/dL (ref 64–422)
IgG (Immunoglobin G), Serum: 1627 mg/dL — ABNORMAL HIGH (ref 586–1602)
IgM (Immunoglobulin M), Srm: 61 mg/dL (ref 26–217)
M Protein SerPl Elph-Mcnc: 0.8 g/dL — ABNORMAL HIGH
Total Protein ELP: 6.8 g/dL (ref 6.0–8.5)

## 2023-09-08 NOTE — Progress Notes (Signed)
 Established Patient Office Visit  Subjective   Patient ID: Margaret Mitchell, female    DOB: 19-Jun-1940  Age: 83 y.o. MRN: [id number]  Chief Complaint  Patient presents with   Results    HPI   Margaret Mitchell has history of hypertension, GERD, multinodular goiter, restless legs, intermittent vertigo.  She had called recently in late June with some increased neuropathy symptoms in her extremities.  We ordered several labs including A1c, B12, thyroid , and both myeloma panel.  She did have small M spike.  We then ordered further testing including CBC, sed rate, UPEP, serum free light chains .  She had increased kappa/lambda ratio of 9.91 and we recommended setting up hematology referral.  Seen by hematologist August 18 and scheduled for bone marrow biopsy which she plans to get this Wednesday.  Repeat lab testing again confirmed IgG monoclonal protein disorder.  Appetite and weight stable.  She had questions regarding multiple recent lab abnormalities  Hypertension currently treated with Benicar , amlodipine , and Toprol -XL.  She suspects whitecoat syndrome.  Home blood pressures consistently 130s or lower.  She states the highest she has seen recently has been 139 systolic.  Past Medical History:  Diagnosis Date   Allergy, unspecified not elsewhere classified    Anxiety    Arthritis    Cervical dysplasia    prior to hysterectomy   GERD (gastroesophageal reflux disease)    Headache(784.0)    Hypertension    IBS (irritable bowel syndrome)    Osteopenia 12/2015   T score -1.1 FRAX 3.7%/0.4%   Pelvic adhesions    Pelvic pain in female    Sinus infection    Thyroid  disease    Parathyroid cyst   Past Surgical History:  Procedure Laterality Date   ABDOMINAL HYSTERECTOMY  1983   TAH/BSO for pelvic pain and adhesions   APPENDECTOMY  1994   BREAST SURGERY     Biopsy-Benign   CATARACT EXTRACTION Bilateral 2013   COLPOSCOPY     FOOT SURGERY Bilateral    HAMMER TOE SURGERY Bilateral     PARATHYROID ADENOMA REMOVED  02/1980   ROTATOR CUFF REPAIR     X2   SHOULDER INJECTION  10/2011   TONSILLECTOMY     TUBAL LIGATION  1981   VESICOVAGINAL FISTULA CLOSURE W/ TAH      reports that she has never smoked. She has never been exposed to tobacco smoke. She has never used smokeless tobacco. She reports current alcohol use of about 1.0 standard drink of alcohol per week. She reports that she does not use drugs. family history includes Dementia in her mother; Diabetes in her maternal aunt, maternal grandmother, and mother; Heart disease in her maternal grandmother; Heart failure in her paternal grandmother; Hypertension in her maternal grandmother; Leukemia in her brother; Lung cancer in her father; Stroke in her maternal grandmother. Allergies  Allergen Reactions   Prednisone Other (See Comments)    REACTION: heart palpatations  In larger doses   Sulfa  Antibiotics Swelling   Sulfasalazine Swelling   Sulfonamide Derivatives Swelling   Vioxx [Rofecoxib] Other (See Comments)    REACTION: mouth swelling (VIOXX)   Other Other (See Comments)    Seldane   Seldane [Terfenadine] Other (See Comments)    SELDANE    Review of Systems  Constitutional:  Negative for malaise/fatigue.  Eyes:  Negative for blurred vision.  Respiratory:  Negative for shortness of breath.   Cardiovascular:  Negative for chest pain.  Neurological:  Negative for dizziness, weakness  and headaches.      Objective:     BP (!) 152/70 (BP Location: Left Arm, Cuff Size: Large)   Pulse 71   Temp 98.2 F (36.8 C) (Oral)   Wt 196 lb 11.2 oz (89.2 kg)   SpO2 98%   BMI 33.24 kg/m  BP Readings from Last 3 Encounters:  09/08/23 (!) 152/70  09/01/23 (!) 151/72  07/07/23 (!) 170/80   Wt Readings from Last 3 Encounters:  09/08/23 196 lb 11.2 oz (89.2 kg)  09/01/23 197 lb (89.4 kg)  07/07/23 193 lb 1.6 oz (87.6 kg)      Physical Exam Vitals reviewed.  Constitutional:      General: She is not in acute  distress.    Appearance: She is well-developed. She is not ill-appearing.  Eyes:     Pupils: Pupils are equal, round, and reactive to light.  Neck:     Thyroid : No thyromegaly.     Vascular: No JVD.  Cardiovascular:     Rate and Rhythm: Normal rate and regular rhythm.     Heart sounds:     No gallop.  Pulmonary:     Effort: Pulmonary effort is normal. No respiratory distress.     Breath sounds: Normal breath sounds. No wheezing or rales.  Musculoskeletal:     Cervical back: Neck supple.  Neurological:     Mental Status: She is alert.      No results found for any visits on 09/08/23.  Last CBC Lab Results  Component Value Date   WBC 7.2 09/01/2023   HGB 12.8 09/01/2023   HCT 38.9 09/01/2023   MCV 94.0 09/01/2023   MCH 30.9 09/01/2023   RDW 12.2 09/01/2023   PLT 322 09/01/2023   Last metabolic panel Lab Results  Component Value Date   GLUCOSE 82 09/01/2023   NA 141 09/01/2023   K 3.5 09/01/2023   CL 108 09/01/2023   CO2 27 09/01/2023   BUN 9 09/01/2023   CREATININE 0.88 09/01/2023   GFRNONAA >60 09/01/2023   CALCIUM 8.6 (L) 09/01/2023   PHOS 4.0 06/16/2013   PROT 7.3 09/01/2023   ALBUMIN 3.9 09/01/2023   LABGLOB 2.8 09/01/2023   AGRATIO 1.2 07/22/2023   BILITOT 0.4 09/01/2023   ALKPHOS 80 09/01/2023   AST 21 09/01/2023   ALT 15 09/01/2023   ANIONGAP 6 09/01/2023   Last lipids Lab Results  Component Value Date   CHOL 178 04/08/2019   HDL 72 04/08/2019   LDLCALC 83 04/08/2019   LDLDIRECT 104.8 05/07/2010   TRIG 137 04/08/2019   CHOLHDL 2.5 04/08/2019   Last hemoglobin A1c Lab Results  Component Value Date   HGBA1C 6.0 07/15/2023   Last thyroid  functions Lab Results  Component Value Date   TSH 3.00 07/15/2023   Last vitamin B12 and Folate Lab Results  Component Value Date   VITAMINB12 445 07/15/2023      The ASCVD Risk score (Arnett DK, et al., 2019) failed to calculate for the following reasons:   The 2019 ASCVD risk score is only  valid for ages 30 to 15    Assessment & Plan:   #1 recent neuropathy symptoms with monoclonal gammopathy of uncertain significance.  Followed by hematology with pending bone marrow biopsy Wednesday.  #2 hypertension.  Blood pressure up today but consistently better at home.  She has brought in her cuff previously and had fairly consistent readings with ours.  We recommended continued close monitoring be in touch if systolic over  140.  Continue low-sodium diet and weight control efforts.  Wolm Scarlet, MD

## 2023-09-08 NOTE — Patient Instructions (Signed)
 Monitor home BP and be in touch if consistently > 140 systolic.

## 2023-09-09 ENCOUNTER — Other Ambulatory Visit: Payer: Self-pay | Admitting: Medical Genetics

## 2023-09-09 ENCOUNTER — Other Ambulatory Visit: Payer: Self-pay | Admitting: Radiology

## 2023-09-09 DIAGNOSIS — D472 Monoclonal gammopathy: Secondary | ICD-10-CM

## 2023-09-09 NOTE — H&P (Signed)
 Chief Complaint: Monoclonal gammopathy of uncertain significance, peripheral neuropathy; referred for image guided bone marrow biopsy for further evaluation  Referring Provider(s): Mohamed,M  Supervising Physician: Hughes Simmonds  Patient Status: [facility] - Out-pt  History of Present Illness: Margaret Mitchell is an 83 y.o. female with past medical history significant for anxiety, arthritis, GERD, hypertension, IBS, osteopenia, parathyroid cyst who presents now with monoclonal gammopathy of undetermined significance/peripheral neuropathy.  She is scheduled today for image guided bone marrow biopsy for further evaluation.  *** Patient is Full Code  Past Medical History:  Diagnosis Date   Allergy, unspecified not elsewhere classified    Anxiety    Arthritis    Cervical dysplasia    prior to hysterectomy   GERD (gastroesophageal reflux disease)    Headache(784.0)    Hypertension    IBS (irritable bowel syndrome)    Osteopenia 12/2015   T score -1.1 FRAX 3.7%/0.4%   Pelvic adhesions    Pelvic pain in female    Sinus infection    Thyroid  disease    Parathyroid cyst    Past Surgical History:  Procedure Laterality Date   ABDOMINAL HYSTERECTOMY  1983   TAH/BSO for pelvic pain and adhesions   APPENDECTOMY  1994   BREAST SURGERY     Biopsy-Benign   CATARACT EXTRACTION Bilateral 2013   COLPOSCOPY     FOOT SURGERY Bilateral    HAMMER TOE SURGERY Bilateral    PARATHYROID ADENOMA REMOVED  02/1980   ROTATOR CUFF REPAIR     X2   SHOULDER INJECTION  10/2011   TONSILLECTOMY     TUBAL LIGATION  1981   VESICOVAGINAL FISTULA CLOSURE W/ TAH      Allergies: Prednisone, Sulfa  antibiotics, Sulfasalazine, Sulfonamide derivatives, Vioxx [rofecoxib], Other, and Seldane [terfenadine]  Medications: Prior to Admission medications   Medication Sig Start Date End Date Taking? Authorizing Provider  amLODipine  (NORVASC ) 5 MG tablet Take 1 tablet (5 mg total) by mouth daily. 12/30/22 09/08/23   Burnard Debby LABOR, MD  metoprolol  succinate (TOPROL -XL) 50 MG 24 hr tablet TAKE 1 TABLET DAILY WITH OR IMMEDIATELY FOLLOWING A MEAL 08/19/23   Burchette, Wolm LELON, MD  Multiple Vitamin (MULTIVITAMIN) tablet Take 1 tablet by mouth daily.    [provider]  olmesartan  (BENICAR ) 20 MG tablet Take 1 tablet (20 mg total) by mouth daily. 12/30/22   Burnard Debby LABOR, MD  omeprazole  (PRILOSEC) 20 MG capsule TAKE 1 CAPSULE(20 MG) BY MOUTH EVERY MORNING 07/22/23   Craig Alan SAUNDERS, PA-C     Family History  Problem Relation Age of Onset   Dementia Mother    Diabetes Mother    Lung cancer Father    Leukemia Brother    Diabetes Maternal Aunt    Hypertension Maternal Grandmother    Heart disease Maternal Grandmother    Diabetes Maternal Grandmother    Stroke Maternal Grandmother    Heart failure Paternal Grandmother    Colon cancer Neg Hx    Esophageal cancer Neg Hx    Stomach cancer Neg Hx    Rectal cancer Neg Hx     Social History   Socioeconomic History   Marital status: Widowed    Spouse name: Norleen Shove   Number of children: 1   Years of education: Not on file   Highest education level: Bachelor's degree (e.g., BA, AB, BS)  Occupational History   Occupation: retired    Associate Professor: RETIRED  Tobacco Use   Smoking status: Never    Passive  exposure: Never   Smokeless tobacco: Never  Vaping Use   Vaping status: Never Used  Substance and Sexual Activity   Alcohol use: Yes    Alcohol/week: 1.0 standard drink of alcohol    Types: 1 Glasses of wine per week    Comment: Rare   Drug use: No   Sexual activity: Yes    Birth control/protection: Surgical    Comment: 1st intercourse 83 yo-Fewer than 5 partners  Other Topics Concern   Not on file  Social History Narrative   Lives with husband.   Retired from working Barrister's clerk at [employer]   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   Right handed   One story home   Social Drivers of Health   Financial Resource Strain:  Low Risk  (07/07/2023)   Overall Financial Resource Strain (CARDIA)    Difficulty of Paying Living Expenses: Not hard at all  Food Insecurity: No Food Insecurity (09/01/2023)   Hunger Vital Sign    Worried About Running Out of Food in the Last Year: Never true    Ran Out of Food in the Last Year: Never true  Transportation Needs: No Transportation Needs (09/01/2023)   PRAPARE - Administrator, Civil Service (Medical): No    Lack of Transportation (Non-Medical): No  Physical Activity: Insufficiently Active (07/07/2023)   Exercise Vital Sign    Days of Exercise per Week: 1 day    Minutes of Exercise per Session: 20 min  Stress: No Stress Concern Present (07/07/2023)   [employer] of Occupational Health - Occupational Stress Questionnaire    Feeling of Stress: Not at all  Social Connections: Moderately Integrated (07/07/2023)   Social Connection and Isolation Panel    Frequency of Communication with Friends and Family: More than three times a week    Frequency of Social Gatherings with Friends and Family: Three times a week    Attends Religious Services: More than 4 times per year    Active Member of Clubs or Organizations: Yes    Attends Banker Meetings: More than 4 times per year    Marital Status: Widowed       Review of Systems  Vital Signs:   Advance Care Plan: No documents on file  Physical Exam  Imaging: No results found.  Labs:  CBC: Recent Labs    07/22/23 1006 09/01/23 1049  WBC 9.8 7.2  HGB 12.7 12.8  HCT 38.4 38.9  PLT 354.0 322    COAGS: No results for input(s): INR, APTT in the last 8760 hours.  BMP: Recent Labs    12/27/22 1153 07/07/23 1417 07/22/23 1006 09/01/23 1049  NA 144 136 137 141  K 3.5 3.7 4.0 3.5  CL 107 100 102 108  CO2 27 29 28 27   GLUCOSE 80 95 81 82  BUN 11 10 13 9   CALCIUM 8.8 9.5 8.7 8.6*  CREATININE 0.87 0.77 0.86 0.88  GFRNONAA  --   --   --  >60    LIVER FUNCTION TESTS: Recent  Labs    07/15/23 0000 07/22/23 1006 09/01/23 1049  BILITOT  --  0.4 0.4  AST  --  19 21  ALT  --  16 15  ALKPHOS  --  74 80  PROT 7.1 6.9  7.4 7.3  ALBUMIN  --  4.0 3.9    TUMOR MARKERS: No results for input(s): AFPTM, CEA, CA199, CHROMGRNA in the last 8760 hours.  Assessment and Plan: 83  y.o. female with past medical history significant for anxiety, arthritis, GERD, hypertension, IBS, osteopenia, parathyroid cyst who presents now with monoclonal gammopathy of undetermined significance/peripheral neuropathy.  She is scheduled today for image guided bone marrow biopsy for further evaluation.Risks and benefits of procedure was discussed with the patient  including, but not limited to bleeding, infection, damage to adjacent structures or low yield requiring additional tests.  All of the questions were answered and there is agreement to proceed.  Consent signed and in chart.    Thank you for allowing our service to participate in Margaret Mitchell 's care.  Electronically Signed: D. Franky Rakers, PA-C   09/09/2023, 4:26 PM      I spent a total of  15 minutes   in face to face in clinical consultation, greater than 50% of which was counseling/coordinating care for image guided bone marrow biopsy

## 2023-09-10 ENCOUNTER — Ambulatory Visit (HOSPITAL_COMMUNITY)
Admission: RE | Admit: 2023-09-10 | Discharge: 2023-09-10 | Disposition: A | Discharge status: Home / Self Care | Source: Ambulatory Visit | Attending: Internal Medicine | Admitting: Internal Medicine

## 2023-09-10 ENCOUNTER — Encounter (HOSPITAL_COMMUNITY): Payer: Self-pay

## 2023-09-10 ENCOUNTER — Ambulatory Visit (HOSPITAL_COMMUNITY)
Admission: RE | Admit: 2023-09-10 | Discharge: 2023-09-10 | Disposition: A | Discharge status: Home / Self Care | Source: Ambulatory Visit | Attending: Internal Medicine

## 2023-09-10 ENCOUNTER — Other Ambulatory Visit: Payer: Self-pay

## 2023-09-10 DIAGNOSIS — D472 Monoclonal gammopathy: Secondary | ICD-10-CM | POA: Insufficient documentation

## 2023-09-10 DIAGNOSIS — D47Z9 Other specified neoplasms of uncertain behavior of lymphoid, hematopoietic and related tissue: Secondary | ICD-10-CM | POA: Diagnosis not present

## 2023-09-10 DIAGNOSIS — I10 Essential (primary) hypertension: Secondary | ICD-10-CM | POA: Diagnosis not present

## 2023-09-10 DIAGNOSIS — Z1379 Encounter for other screening for genetic and chromosomal anomalies: Secondary | ICD-10-CM | POA: Insufficient documentation

## 2023-09-10 DIAGNOSIS — G629 Polyneuropathy, unspecified: Secondary | ICD-10-CM | POA: Diagnosis not present

## 2023-09-10 DIAGNOSIS — D759 Disease of blood and blood-forming organs, unspecified: Secondary | ICD-10-CM | POA: Diagnosis not present

## 2023-09-10 LAB — CBC WITH DIFFERENTIAL/PLATELET
Abs Immature Granulocytes: 0.02 K/uL (ref 0.00–0.07)
Basophils Absolute: 0.1 K/uL (ref 0.0–0.1)
Basophils Relative: 1 %
Eosinophils Absolute: 0.2 K/uL (ref 0.0–0.5)
Eosinophils Relative: 2 %
HCT: 40.4 % (ref 36.0–46.0)
Hemoglobin: 13.1 g/dL (ref 12.0–15.0)
Immature Granulocytes: 0 %
Lymphocytes Relative: 45 %
Lymphs Abs: 3.8 K/uL (ref 0.7–4.0)
MCH: 31 pg (ref 26.0–34.0)
MCHC: 32.4 g/dL (ref 30.0–36.0)
MCV: 95.5 fL (ref 80.0–100.0)
Monocytes Absolute: 0.8 K/uL (ref 0.1–1.0)
Monocytes Relative: 9 %
Neutro Abs: 3.6 K/uL (ref 1.7–7.7)
Neutrophils Relative %: 43 %
Platelets: 306 K/uL (ref 150–400)
RBC: 4.23 MIL/uL (ref 3.87–5.11)
RDW: 12.4 % (ref 11.5–15.5)
WBC: 8.4 K/uL (ref 4.0–10.5)
nRBC: 0 % (ref 0.0–0.2)

## 2023-09-10 MED ORDER — SODIUM CHLORIDE 0.9 % IV SOLN: INTRAVENOUS | Status: DC

## 2023-09-10 MED ORDER — MIDAZOLAM HCL 2 MG/2ML IJ SOLN
INTRAMUSCULAR | Status: AC | PRN
  Administered 2023-09-10: 1 mg via INTRAVENOUS

## 2023-09-10 MED ORDER — FENTANYL CITRATE (PF) 100 MCG/2ML IJ SOLN
INTRAMUSCULAR | Status: AC | PRN
  Administered 2023-09-10: 50 ug via INTRAVENOUS

## 2023-09-10 MED ORDER — HYDRALAZINE HCL 20 MG/ML IJ SOLN
INTRAMUSCULAR | Status: AC | PRN
  Administered 2023-09-10: 10 mg via INTRAVENOUS

## 2023-09-10 MED ORDER — HYDRALAZINE HCL 20 MG/ML IJ SOLN
INTRAMUSCULAR | Status: AC
  Filled 2023-09-10: qty 1

## 2023-09-10 MED ORDER — FENTANYL CITRATE (PF) 100 MCG/2ML IJ SOLN
INTRAMUSCULAR | Status: AC
Start: 2023-09-10 — End: 2023-09-10
  Filled 2023-09-10: qty 2

## 2023-09-10 MED ORDER — MIDAZOLAM HCL 2 MG/2ML IJ SOLN
INTRAMUSCULAR | Status: AC
  Filled 2023-09-10: qty 2

## 2023-09-10 NOTE — Procedures (Signed)
 Vascular and Interventional Radiology Procedure Note  Patient: Margaret Mitchell DOB: 1940-07-23 Medical Record Number: [id number] Note Date/Time: 09/10/23 9:15 AM   Performing Physician: Thom Hall, MD Assistant(s): None  Diagnosis: R/o MM   Procedure: BONE MARROW ASPIRATION and BIOPSY  Anesthesia: Conscious Sedation Complications: None Estimated Blood Loss: Minimal Specimens: Sent for Pathology  Findings:  Successful CT-guided bone marrow aspiration and biopsy A total of 1 cores were obtained. Hemostasis of the tract was achieved using Manual Pressure.  Plan: Bed rest for 1 hours.  See detailed procedure note with images in PACS. The patient tolerated the procedure well without incident or complication and was returned to Recovery in stable condition.    Thom Hall, MD [facility]ascular and Interventional Radiology Specialists [facility] Radiology   Pager. [phone] Clinic. [phone]

## 2023-09-10 NOTE — Discharge Instructions (Signed)
 Bone Marrow Aspiration and Bone Marrow Biopsy, Adult, Care After  The following information offers guidance on how to care for yourself after your procedure. Your health care provider may also give you more specific instructions. If you have problems or questions, contact your health care provider.  What can I expect after the procedure?  May remove dressing or bandaid and shower tomorrow.  Keep site clean and dry. Replace with clean dressing or bandaid as necessary. Urgent needs IR clinic [phone] (mon-fri 8-5).  After the procedure, it is common to have: Mild pain and tenderness. Swelling. Bruising. Follow these instructions at home: Incision care  Follow instructions from your health care provider about how to take care of the incision site. Make sure you: Wash your hands with soap and water for at least 20 seconds before and after you change your bandage (dressing). If soap and water are not available, use hand sanitizer. Change your dressing as told by your health care provider. Leave stitches (sutures), skin glue, or adhesive strips in place. These skin closures may need to stay in place for 2 weeks or longer. If adhesive strip edges start to loosen and curl up, you may trim the loose edges. Do not remove adhesive strips completely unless your health care provider tells you to do that. Check your incision site every day for signs of infection. Check for: More redness, swelling, or pain. Fluid or blood. Warmth. Pus or a bad smell. Activity Return to your normal activities as told by your health care provider. Ask your health care provider what activities are safe for you. Do not lift anything that is heavier than 10 lb (4.5 kg), or the limit that you are told, until your health care provider says that it is safe. If you were given a sedative during the procedure, it can affect you for several hours. Do not drive or operate machinery until your health care provider says that it is  safe. General instructions  Take over-the-counter and prescription medicines only as told by your health care provider. Do not take baths, swim, or use a hot tub until your health care provider approves. Ask your health care provider if you may take showers. You may only be allowed to take sponge baths. If directed, put ice on the affected area. To do this: Put ice in a plastic bag. Place a towel between your skin and the bag. Leave the ice on for 20 minutes, 2-3 times a day. If your skin turns bright red, remove the ice right away to prevent skin damage. The risk of skin damage is higher if you cannot feel pain, heat, or cold. Contact a health care provider if: You have signs of infection. Your pain is not controlled with medicine. You have cancer, and a temperature of 100.22F (38C) or higher. Get help right away if: You have a temperature of 101F (38.3C) or higher, or as told by your health care provider. You have bleeding from the incision site that cannot be controlled. This information is not intended to replace advice given to you by your health care provider. Make sure you discuss any questions you have with your health care provider. Document Revised: 05/07/2021 Document Reviewed: 05/07/2021 Elsevier Patient Education  2023 Elsevier Inc.     Moderate Conscious Sedation, Adult, Care After  This sheet gives you information about how to care for yourself after your procedure. Your health care provider may also give you more specific instructions. If you have problems or questions, contact  your health care provider. What can I expect after the procedure? After the procedure, it is common to have: Sleepiness for several hours. Impaired judgment for several hours. Difficulty with balance. Vomiting if you eat too soon. Follow these instructions at home: For the time period you were told by your health care provider:     Rest. Do not participate in activities where you  could fall or become injured. Do not drive or use machinery. Do not drink alcohol. Do not take sleeping pills or medicines that cause drowsiness. Do not make important decisions or sign legal documents. Do not take care of children on your own. Eating and drinking  Follow the diet recommended by your health care provider. Drink enough fluid to keep your urine pale yellow. If you vomit: Drink water, juice, or soup when you can drink without vomiting. Make sure you have little or no nausea before eating solid foods. General instructions Take over-the-counter and prescription medicines only as told by your health care provider. Have a responsible adult stay with you for the time you are told. It is important to have someone help care for you until you are awake and alert. Do not smoke. Keep all follow-up visits as told by your health care provider. This is important. Contact a health care provider if: You are still sleepy or having trouble with balance after 24 hours. You feel light-headed. You keep feeling nauseous or you keep vomiting. You develop a rash. You have a fever. You have redness or swelling around the IV site. Get help right away if: You have trouble breathing. You have new-onset confusion at home. Summary After the procedure, it is common to feel sleepy, have impaired judgment, or feel nauseous if you eat too soon. Rest after you get home. Know the things you should not do after the procedure. Follow the diet recommended by your health care provider and drink enough fluid to keep your urine pale yellow. Get help right away if you have trouble breathing or new-onset confusion at home. This information is not intended to replace advice given to you by your health care provider. Make sure you discuss any questions you have with your health care provider. Document Revised: 04/30/2019 Document Reviewed: 11/26/2018 Elsevier Patient Education  2023 [employer].

## 2023-09-16 LAB — SURGICAL PATHOLOGY

## 2023-09-23 ENCOUNTER — Encounter (HOSPITAL_COMMUNITY): Payer: Self-pay | Admitting: Internal Medicine

## 2023-09-25 ENCOUNTER — Encounter: Payer: Self-pay | Admitting: Internal Medicine

## 2023-10-01 ENCOUNTER — Inpatient Hospital Stay (HOSPITAL_BASED_OUTPATIENT_CLINIC_OR_DEPARTMENT_OTHER): Admitting: Internal Medicine

## 2023-10-01 ENCOUNTER — Inpatient Hospital Stay: Attending: Internal Medicine

## 2023-10-01 VITALS — BP 157/72 | HR 66 | Temp 97.3°F | Resp 17 | Ht 64.5 in | Wt 196.0 lb

## 2023-10-01 DIAGNOSIS — D472 Monoclonal gammopathy: Secondary | ICD-10-CM

## 2023-10-01 DIAGNOSIS — Z79899 Other long term (current) drug therapy: Secondary | ICD-10-CM | POA: Diagnosis not present

## 2023-10-01 DIAGNOSIS — I10 Essential (primary) hypertension: Secondary | ICD-10-CM | POA: Insufficient documentation

## 2023-10-01 DIAGNOSIS — M8580 Other specified disorders of bone density and structure, unspecified site: Secondary | ICD-10-CM | POA: Insufficient documentation

## 2023-10-01 LAB — CBC WITH DIFFERENTIAL (CANCER CENTER ONLY)
Abs Immature Granulocytes: 0.01 K/uL (ref 0.00–0.07)
Basophils Absolute: 0.1 K/uL (ref 0.0–0.1)
Basophils Relative: 1 %
Eosinophils Absolute: 0.1 K/uL (ref 0.0–0.5)
Eosinophils Relative: 1 %
HCT: 39.8 % (ref 36.0–46.0)
Hemoglobin: 12.8 g/dL (ref 12.0–15.0)
Immature Granulocytes: 0 %
Lymphocytes Relative: 44 %
Lymphs Abs: 3.6 K/uL (ref 0.7–4.0)
MCH: 30.3 pg (ref 26.0–34.0)
MCHC: 32.2 g/dL (ref 30.0–36.0)
MCV: 94.3 fL (ref 80.0–100.0)
Monocytes Absolute: 0.8 K/uL (ref 0.1–1.0)
Monocytes Relative: 10 %
Neutro Abs: 3.7 K/uL (ref 1.7–7.7)
Neutrophils Relative %: 44 %
Platelet Count: 265 K/uL (ref 150–400)
RBC: 4.22 MIL/uL (ref 3.87–5.11)
RDW: 12.3 % (ref 11.5–15.5)
WBC Count: 8.3 K/uL (ref 4.0–10.5)
nRBC: 0 % (ref 0.0–0.2)

## 2023-10-01 LAB — CMP (CANCER CENTER ONLY)
ALT: 14 U/L (ref 0–44)
AST: 22 U/L (ref 15–41)
Albumin: 4 g/dL (ref 3.5–5.0)
Alkaline Phosphatase: 81 U/L (ref 38–126)
Anion gap: 6 (ref 5–15)
BUN: 14 mg/dL (ref 8–23)
CO2: 26 mmol/L (ref 22–32)
Calcium: 8.7 mg/dL — ABNORMAL LOW (ref 8.9–10.3)
Chloride: 107 mmol/L (ref 98–111)
Creatinine: 0.84 mg/dL (ref 0.44–1.00)
GFR, Estimated: >60 mL/min (ref >60)
Glucose, Bld: 84 mg/dL (ref 70–99)
Potassium: 3.7 mmol/L (ref 3.5–5.1)
Sodium: 139 mmol/L (ref 135–145)
Total Bilirubin: 0.4 mg/dL (ref 0.0–1.2)
Total Protein: 7.5 g/dL (ref 6.5–8.1)

## 2023-10-01 LAB — LACTATE DEHYDROGENASE: LDH: 165 U/L (ref 98–192)

## 2023-10-01 NOTE — Progress Notes (Signed)
 [facility] [phone]   [phone]  OFFICE PROGRESS NOTE  Micheal Wolm ORN, MD [address] [place] [address]  DIAGNOSIS: Monoclonal gammopathy of undetermined significance.  Bone marrow biopsy and aspirate on 09/10/2023 showed only 7% plasma cells.  PRIOR THERAPY: None  CURRENT THERAPY: Observation  INTERVAL HISTORY: Margaret Mitchell 83 y.o. female returns to the clinic today for  Discussed the use of AI scribe software for clinical note transcription with the patient, who gave verbal consent to proceed.  History of Present Illness Margaret Mitchell is an 83 year old female with monoclonal gammopathy of undetermined significance (MGUS) who presents for evaluation and repeat blood work.  She has a history of monoclonal gammopathy of undetermined significance (MGUS) with a bone marrow biopsy and aspirate showing seven percent plasma cells. She is currently under observation for this condition.  The bone marrow biopsy procedure was not that bad and she did not find it painful.  No current complaints or symptoms. She is feeling fine with no pain and reports no new symptoms during the review of systems.     MEDICAL HISTORY: Past Medical History:  Diagnosis Date   Allergy, unspecified not elsewhere classified    Anxiety    Arthritis    Cervical dysplasia    prior to hysterectomy   GERD (gastroesophageal reflux disease)    Headache(784.0)    Hypertension    IBS (irritable bowel syndrome)    Osteopenia 12/2015   T score -1.1 FRAX 3.7%/0.4%   Pelvic adhesions    Pelvic pain in female    Sinus infection    Thyroid  disease    Parathyroid cyst    ALLERGIES:  is allergic to prednisone, sulfa  antibiotics, sulfasalazine, sulfonamide derivatives, vioxx [rofecoxib], other, and seldane [terfenadine].  MEDICATIONS:  Current Outpatient Medications  Medication Sig Dispense Refill   amLODipine  (NORVASC ) 5 MG tablet Take 1  tablet (5 mg total) by mouth daily. 180 tablet 3   metoprolol  succinate (TOPROL -XL) 50 MG 24 hr tablet TAKE 1 TABLET DAILY WITH OR IMMEDIATELY FOLLOWING A MEAL 90 tablet 1   Multiple Vitamin (MULTIVITAMIN) tablet Take 1 tablet by mouth daily.     olmesartan  (BENICAR ) 20 MG tablet Take 1 tablet (20 mg total) by mouth daily. 90 tablet 3   omeprazole  (PRILOSEC) 20 MG capsule TAKE 1 CAPSULE(20 MG) BY MOUTH EVERY MORNING 30 capsule 2   No current facility-administered medications for this visit.    SURGICAL HISTORY:  Past Surgical History:  Procedure Laterality Date   ABDOMINAL HYSTERECTOMY  1983   TAH/BSO for pelvic pain and adhesions   APPENDECTOMY  1994   BREAST SURGERY     Biopsy-Benign   CATARACT EXTRACTION Bilateral 2013   COLPOSCOPY     FOOT SURGERY Bilateral    HAMMER TOE SURGERY Bilateral    PARATHYROID ADENOMA REMOVED  02/1980   ROTATOR CUFF REPAIR     X2   SHOULDER INJECTION  10/2011   TONSILLECTOMY     TUBAL LIGATION  1981   VESICOVAGINAL FISTULA CLOSURE W/ TAH      REVIEW OF SYSTEMS:  A comprehensive review of systems was negative.   PHYSICAL EXAMINATION: General appearance: alert, cooperative, and no distress Head: Normocephalic, without obvious abnormality, atraumatic Neck: no adenopathy, no JVD, supple, symmetrical, trachea midline, and thyroid  not enlarged, symmetric, no tenderness/mass/nodules Lymph nodes: Cervical, supraclavicular, and axillary nodes normal. Resp: clear to auscultation bilaterally Back: symmetric, no curvature. ROM normal.  No CVA tenderness. Cardio: regular rate and rhythm, S1, S2 normal, no murmur, click, rub or gallop GI: soft, non-tender; bowel sounds normal; no masses,  no organomegaly Extremities: extremities normal, atraumatic, no cyanosis or edema  ECOG PERFORMANCE STATUS: 0 - Asymptomatic  Blood pressure (!) 157/72, pulse 66, temperature (!) 97.3 F (36.3 C), resp. rate 17, height 5' 4.5 (1.638 m), weight 196 lb (88.9 kg), SpO2  99%.  LABORATORY DATA: Lab Results  Component Value Date   WBC 8.3 10/01/2023   HGB 12.8 10/01/2023   HCT 39.8 10/01/2023   MCV 94.3 10/01/2023   PLT 265 10/01/2023      Chemistry      Component Value Date/Time   NA 139 10/01/2023 1028   NA 141 04/08/2019 0959   K 3.7 10/01/2023 1028   CL 107 10/01/2023 1028   CO2 26 10/01/2023 1028   BUN 14 10/01/2023 1028   BUN 13 04/08/2019 0959   CREATININE 0.84 10/01/2023 1028      Component Value Date/Time   CALCIUM 8.7 (L) 10/01/2023 1028   ALKPHOS 81 10/01/2023 1028   AST 22 10/01/2023 1028   ALT 14 10/01/2023 1028   BILITOT 0.4 10/01/2023 1028       RADIOGRAPHIC STUDIES: CT BONE MARROW BIOPSY & ASPIRATION Result Date: 09/10/2023 INDICATION: Bone marrow biopsy and aspirate to rule out multiple myeloma EXAM: CT GUIDED BONE MARROW ASPIRATION AND CORE BIOPSY MEDICATIONS: None. ANESTHESIA/SEDATION: Moderate (conscious) sedation was employed during this procedure. A total of Versed  1 mg and Fentanyl  50 mcg was administered intravenously. Moderate Sedation Time: 14 minutes. The patient's level of consciousness and vital signs were monitored continuously by radiology nursing throughout the procedure under my direct supervision. FLUOROSCOPY TIME:  CT dose; 248 mGycm COMPLICATIONS: None immediate. Estimated blood loss: <5 mL PROCEDURE: RADIATION DOSE REDUCTION: This exam was performed according to the departmental dose-optimization program which includes automated exposure control, adjustment of the mA and/or kV according to patient size and/or use of iterative reconstruction technique. Informed written consent was obtained from the patient after a thorough discussion of the procedural risks, benefits and alternatives. All questions were addressed. Maximal Sterile Barrier Technique was utilized including caps, mask, sterile gowns, sterile gloves, sterile drape, hand hygiene and skin antiseptic. A timeout was performed prior to the initiation of the  procedure. The patient was positioned prone and non-contrast localization CT was performed of the pelvis to demonstrate the iliac marrow spaces. Under sterile conditions and local anesthesia, an 11 gauge coaxial bone biopsy needle was advanced into the RIGHT iliac marrow space. Needle position was confirmed with CT imaging. Initially, bone marrow aspiration was performed. Next, the 11 gauge outer cannula was utilized to obtain 1 iliac bone marrow core biopsy. Needle was removed. Hemostasis was obtained with compression. The patient tolerated the procedure well. Samples were prepared with the cytotechnologist. IMPRESSION: Successful CT-guided bone marrow aspiration and biopsy. Thom Hall, MD Vascular and Interventional Radiology Specialists [facility] Radiology Electronically Signed   By: Thom Hall M.D.   On: 09/10/2023 11:57    ASSESSMENT AND PLAN: This is a very pleasant 83 years old African-American female with monoclonal gammopathy of undetermined significance with bone marrow biopsy and aspirate showed 7% plasma cells.  She is currently on observation and asymptomatic with no concerning abnormalities on her lab work.  Assessment and Plan Assessment & Plan Monoclonal gammopathy of undetermined significance (MGUS) MGUS with a bone marrow biopsy showing seven percent plasma cells, below the threshold for multiple myeloma. No  associated anemia or kidney function abnormalities. Cytogenetics and molecular studies are normal. Risk of progression to multiple myeloma is approximately one percent per year. - Schedule follow-up appointment in six months - Repeat blood work one week prior to the next appointment She was advised to call immediately if she has any concerning symptoms in the interval. The patient voices understanding of current disease status and treatment options and is in agreement with the current care plan.  All questions were answered. The patient knows to call the clinic with any  problems, questions or concerns. We can certainly see the patient much sooner if necessary. The total time spent in the appointment was 20 minutes including review of chart and various tests results, discussions about plan of care and coordination of care plan .   Disclaimer: This note was dictated with voice recognition software. Similar sounding words can inadvertently be transcribed and may not be corrected upon review.

## 2023-10-03 LAB — BETA 2 MICROGLOBULIN, SERUM: Beta-2 Microglobulin: 2.2 mg/L (ref 0.6–2.4)

## 2023-10-03 LAB — KAPPA/LAMBDA LIGHT CHAINS
Kappa free light chain: 190.4 mg/L — ABNORMAL HIGH (ref 3.3–19.4)
Kappa, lambda light chain ratio: 10.02 — ABNORMAL HIGH (ref 0.26–1.65)
Lambda free light chains: 19 mg/L (ref 5.7–26.3)

## 2023-10-04 LAB — MULTIPLE MYELOMA PANEL, SERUM
Albumin SerPl Elph-Mcnc: 3.3 g/dL (ref 2.9–4.4)
Albumin/Glob SerPl: 1 (ref 0.7–1.7)
Alpha 1: 0.2 g/dL (ref 0.0–0.4)
Alpha2 Glob SerPl Elph-Mcnc: 0.8 g/dL (ref 0.4–1.0)
B-Globulin SerPl Elph-Mcnc: 1 g/dL (ref 0.7–1.3)
Gamma Glob SerPl Elph-Mcnc: 1.5 g/dL (ref 0.4–1.8)
Globulin, Total: 3.6 g/dL (ref 2.2–3.9)
IgA: 153 mg/dL (ref 64–422)
IgG (Immunoglobin G), Serum: 1582 mg/dL (ref 586–1602)
IgM (Immunoglobulin M), Srm: 54 mg/dL (ref 26–217)
M Protein SerPl Elph-Mcnc: 0.9 g/dL — ABNORMAL HIGH
Total Protein ELP: 6.9 g/dL (ref 6.0–8.5)

## 2023-10-07 ENCOUNTER — Encounter: Payer: Self-pay | Admitting: Internal Medicine

## 2023-10-08 ENCOUNTER — Ambulatory Visit (INDEPENDENT_AMBULATORY_CARE_PROVIDER_SITE_OTHER): Payer: Medicare Other

## 2023-10-08 VITALS — BP 118/60 | HR 77 | Temp 97.8°F | Ht 64.5 in | Wt 197.2 lb

## 2023-10-08 DIAGNOSIS — Z0000 Encounter for general adult medical examination without abnormal findings: Secondary | ICD-10-CM | POA: Diagnosis not present

## 2023-10-08 DIAGNOSIS — Z23 Encounter for immunization: Secondary | ICD-10-CM | POA: Diagnosis not present

## 2023-10-08 NOTE — Patient Instructions (Addendum)
 Margaret Mitchell,  Thank you for taking the time for your Medicare Wellness Visit. I appreciate your continued commitment to your health goals. Please review the care plan we discussed, and feel free to reach out if I can assist you further.  Medicare recommends these wellness visits once per year to help you and your care team stay ahead of potential health issues. These visits are designed to focus on prevention, allowing your provider to concentrate on managing your acute and chronic conditions during your regular appointments.  Please note that Annual Wellness Visits do not include a physical exam. Some assessments may be limited, especially if the visit was conducted virtually. If needed, we may recommend a separate in-person follow-up with your provider.  Ongoing Care Seeing your primary care provider every 3 to 6 months helps us  monitor your health and provide consistent, personalized care.   Referrals If a referral was made during today's visit and you haven't received any updates within two weeks, please contact the referred provider directly to check on the status.  Recommended Screenings:  Health Maintenance  Topic Date Due   DTaP/Tdap/Td vaccine (2 - Td or Tdap) 10/14/2022   COVID-19 Vaccine (8 - Pfizer risk 2024-25 season) 09/15/2023   Medicare Annual Wellness Visit  10/07/2024   Pneumococcal Vaccine for age over 20  Completed   Flu Shot  Completed   DEXA scan (bone density measurement)  Completed   Zoster (Shingles) Vaccine  Completed   HPV Vaccine  Aged Out   Meningitis B Vaccine  Aged Out       10/08/2023   10:04 AM  Advanced Directives  Does Patient Have a Medical Advance Directive? Yes  Type of Estate agent of [place];Living will  Does patient want to make changes to medical advance directive? No - Patient declined  Copy of Healthcare Power of Attorney in Chart? No - copy requested   Advance Care Planning is important because it: Ensures you  receive medical care that aligns with your values, goals, and preferences. Provides guidance to your family and loved ones, reducing the emotional burden of decision-making during critical moments.  Vision: Annual vision screenings are recommended for early detection of glaucoma, cataracts, and diabetic retinopathy. These exams can also reveal signs of chronic conditions such as diabetes and high blood pressure.  Dental: Annual dental screenings help detect early signs of oral cancer, gum disease, and other conditions linked to overall health, including heart disease and diabetes.  Please see the attached documents for additional preventive care recommendations.

## 2023-10-08 NOTE — Progress Notes (Addendum)
 Subjective:   Margaret Mitchell is a 83 y.o. who presents for a Medicare Wellness preventive visit.  As a reminder, Annual Wellness Visits don't include a physical exam, and some assessments may be limited, especially if this visit is performed virtually. We may recommend an in-person follow-up visit with your provider if needed.  Visit Complete: In person    Persons Participating in Visit: Patient.  AWV Questionnaire: No: Patient Medicare AWV questionnaire was not completed prior to this visit.  Cardiac Risk Factors include: advanced age (>90men, >70 women);hypertension     Objective:    Today's Vitals   10/08/23 0941 10/08/23 0953 10/08/23 0955  BP: 118/60    Pulse: 77    Temp: 97.8 F (36.6 C)    TempSrc: Oral    SpO2: 98%    Weight: 197 lb 3.2 oz (89.4 kg)    Height: 5' 4.5 (1.638 m)    PainSc:  0-No pain 0-No pain   Body mass index is 33.33 kg/m.     10/08/2023   10:04 AM 09/10/2023    7:16 AM 10/04/2022   10:01 AM 09/25/2021   11:41 AM 07/06/2021    3:13 PM 03/12/2021    1:37 PM 09/05/2020   11:54 AM  Advanced Directives  Does Patient Have a Medical Advance Directive? Yes No Yes No No No Yes  Type of Estate agent of [place];Living will  Healthcare Power of [place];Living will    Healthcare Power of Attorney  Does patient want to make changes to medical advance directive? No - Patient declined  No - Patient declined      Copy of Healthcare Power of Attorney in Chart? No - copy requested  Yes - validated most recent copy scanned in chart (See row information)    Yes - validated most recent copy scanned in chart (See row information)  Would patient like information on creating a medical advance directive?  No - Patient declined    No - Patient declined     Current Medications (verified) Outpatient Encounter Medications as of 10/08/2023  Medication Sig   amLODipine  (NORVASC ) 5 MG tablet Take 1 tablet (5 mg total) by mouth daily.    metoprolol  succinate (TOPROL -XL) 50 MG 24 hr tablet TAKE 1 TABLET DAILY WITH OR IMMEDIATELY FOLLOWING A MEAL   Multiple Vitamin (MULTIVITAMIN) tablet Take 1 tablet by mouth daily.   olmesartan  (BENICAR ) 20 MG tablet Take 1 tablet (20 mg total) by mouth daily.   omeprazole  (PRILOSEC) 20 MG capsule TAKE 1 CAPSULE(20 MG) BY MOUTH EVERY MORNING   No facility-administered encounter medications on file as of 10/08/2023.    Allergies (verified) Prednisone, Sulfa  antibiotics, Sulfasalazine, Sulfonamide derivatives, Vioxx [rofecoxib], Other, and Seldane [terfenadine]   History: Past Medical History:  Diagnosis Date   Allergy, unspecified not elsewhere classified    Anxiety    Arthritis    Cervical dysplasia    prior to hysterectomy   GERD (gastroesophageal reflux disease)    Headache(784.0)    Hypertension    IBS (irritable bowel syndrome)    Osteopenia 12/2015   T score -1.1 FRAX 3.7%/0.4%   Pelvic adhesions    Pelvic pain in female    Sinus infection    Thyroid  disease    Parathyroid cyst   Past Surgical History:  Procedure Laterality Date   ABDOMINAL HYSTERECTOMY  1983   TAH/BSO for pelvic pain and adhesions   APPENDECTOMY  1994   BREAST SURGERY     Biopsy-Benign  CATARACT EXTRACTION Bilateral 2013   COLPOSCOPY     FOOT SURGERY Bilateral    HAMMER TOE SURGERY Bilateral    PARATHYROID ADENOMA REMOVED  02/1980   ROTATOR CUFF REPAIR     X2   SHOULDER INJECTION  10/2011   TONSILLECTOMY     TUBAL LIGATION  1981   VESICOVAGINAL FISTULA CLOSURE W/ TAH     Family History  Problem Relation Age of Onset   Dementia Mother    Diabetes Mother    Lung cancer Father    Leukemia Brother    Diabetes Maternal Aunt    Hypertension Maternal Grandmother    Heart disease Maternal Grandmother    Diabetes Maternal Grandmother    Stroke Maternal Grandmother    Heart failure Paternal Grandmother    Colon cancer Neg Hx    Esophageal cancer Neg Hx    Stomach cancer Neg Hx    Rectal  cancer Neg Hx    Social History   Socioeconomic History   Marital status: Widowed    Spouse name: Norleen Shove   Number of children: 1   Years of education: Not on file   Highest education level: Bachelor's degree (e.g., BA, AB, BS)  Occupational History   Occupation: retired    Associate Professor: RETIRED  Tobacco Use   Smoking status: Never    Passive exposure: Never   Smokeless tobacco: Never  Vaping Use   Vaping status: Never Used  Substance and Sexual Activity   Alcohol use: Yes    Alcohol/week: 1.0 standard drink of alcohol    Types: 1 Glasses of wine per week    Comment: Rare   Drug use: No   Sexual activity: Yes    Birth control/protection: Surgical    Comment: 1st intercourse 83 yo-Fewer than 5 partners  Other Topics Concern   Not on file  Social History Narrative   Lives with husband.   Retired from working Barrister's clerk at [employer]   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   Right handed   One story home   Social Drivers of Health   Financial Resource Strain: Low Risk  (10/08/2023)   Overall Financial Resource Strain (CARDIA)    Difficulty of Paying Living Expenses: Not hard at all  Food Insecurity: No Food Insecurity (10/08/2023)   Hunger Vital Sign    Worried About Running Out of Food in the Last Year: Never true    Ran Out of Food in the Last Year: Never true  Transportation Needs: No Transportation Needs (10/08/2023)   PRAPARE - Administrator, Civil Service (Medical): No    Lack of Transportation (Non-Medical): No  Physical Activity: Insufficiently Active (10/08/2023)   Exercise Vital Sign    Days of Exercise per Week: 7 days    Minutes of Exercise per Session: 10 min  Stress: No Stress Concern Present (10/08/2023)   [employer] of Occupational Health - Occupational Stress Questionnaire    Feeling of Stress: Not at all  Social Connections: Moderately Integrated (10/08/2023)   Social Connection and Isolation Panel    Frequency of  Communication with Friends and Family: More than three times a week    Frequency of Social Gatherings with Friends and Family: More than three times a week    Attends Religious Services: More than 4 times per year    Active Member of [employer] or Organizations: Yes    Attends Banker Meetings: More than 4 times per year  Marital Status: Widowed    Tobacco Counseling Counseling given: Not Answered    Clinical Intake:  Pre-visit preparation completed: Yes  Pain : No/denies pain Pain Score: 0-No pain     BMI - recorded: 33.33 Nutritional Risks: None Diabetes: No  Lab Results  Component Value Date   HGBA1C 6.0 07/15/2023   HGBA1C 6.1 05/03/2015   HGBA1C 5.8 10/24/2010     How often do you need to have someone help you when you read instructions, pamphlets, or other written materials from your doctor or pharmacy?: 1 - Never  Interpreter Needed?: No  Information entered by :: Rojelio Blush LPN   Activities of Daily Living     10/08/2023   10:03 AM 09/10/2023    7:15 AM  In your present state of health, do you have any difficulty performing the following activities:  Hearing? 0 0  Vision? 0 0  Difficulty concentrating or making decisions? 0 0  Walking or climbing stairs? 0   Dressing or bathing? 0   Doing errands, shopping? 0   Preparing Food and eating ? N   Using the Toilet? N   In the past six months, have you accidently leaked urine? N   Do you have problems with loss of bowel control? N   Managing your Medications? N   Managing your Finances? N   Housekeeping or managing your Housekeeping? N     Patient Care Team: Micheal Wolm ORN, MD as PCP - General (Family Medicine) Burnard Debby LABOR, MD (Inactive) as PCP - Cardiology (Cardiology)  I have updated your Care Teams any recent Medical Services you may have received from other providers in the past year.     Assessment:   This is a routine wellness examination for Kristina.  Hearing/Vision  screen Hearing Screening - Comments:: Denies hearing difficulties   Vision Screening - Comments:: Wears rx glasses - up to date with routine eye exams with  Dr Darcie   Goals Addressed               This Visit's Progress     Increase physical activity (pt-stated)         Depression Screen     10/08/2023    9:43 AM 09/01/2023   12:26 PM 11/14/2022    3:25 PM 10/21/2022    8:55 AM 10/04/2022    9:36 AM 05/30/2022   10:33 AM 01/22/2022    9:25 AM  PHQ 2/9 Scores  PHQ - 2 Score 0 0 0 0 0 0 0  PHQ- 9 Score   0   0 0    Fall Risk     10/08/2023   10:03 AM 03/24/2023    7:29 PM 09/30/2022   12:39 PM 05/30/2022   10:33 AM 01/19/2022    9:00 PM  Fall Risk   Falls in the past year? 0 1 0 0 0  Number falls in past yr: 0 0 0 0   Injury with Fall? 0 1 0 0   Risk for fall due to : No Fall Risks  No Fall Risks    Follow up Falls evaluation completed  Falls prevention discussed Falls evaluation completed     MEDICARE RISK AT HOME:  Medicare Risk at Home Any stairs in or around the home?: Yes If so, are there any without handrails?: No Home free of loose throw rugs in walkways, pet beds, electrical cords, etc?: Yes Adequate lighting in your home to reduce risk of falls?: Yes Life  alert?: Yes Use of a cane, walker or w/c?: No Grab bars in the bathroom?: Yes Shower chair or bench in shower?: Yes Elevated toilet seat or a handicapped toilet?: Yes  TIMED UP AND GO:  Was the test performed?  Yes  Length of time to ambulate 10 feet: 10 sec Gait steady and fast without use of assistive device  Cognitive Function: 6CIT completed    06/06/2017    2:17 PM 02/07/2016    4:37 PM  MMSE - Mini Mental State Exam  Not completed: -- --        10/08/2023   10:04 AM 10/04/2022   10:02 AM 09/25/2021   11:44 AM 09/05/2020   11:56 AM 09/02/2019   10:51 AM  6CIT Screen  What Year? 0 points 0 points 0 points 0 points 0 points  What month? 0 points 0 points 0 points 0 points 0 points  What time?  0 points 0 points 0 points 0 points 0 points  Count back from 20 0 points 0 points 0 points 0 points 0 points  Months in reverse 0 points 0 points 0 points 0 points 0 points  Repeat phrase 0 points 0 points 2 points 0 points 0 points  Total Score 0 points 0 points 2 points 0 points 0 points    Immunizations Immunization History  Administered Date(s) Administered   Fluad Quad(high Dose 65+) 10/01/2018, 10/12/2019, 10/17/2020, 09/25/2021, 09/25/2021, 10/04/2022   Fluad Trivalent(High Dose 65+) 10/04/2022   INFLUENZA, HIGH DOSE SEASONAL PF 10/09/2013, 10/28/2014, 09/28/2015, 10/07/2016, 10/17/2017, 10/08/2023   Influenza Split 11/13/2010, 11/12/2011   Influenza Whole 10/27/2007, 11/08/2008, 12/08/2009   Influenza,inj,Quad PF,6+ Mos 09/30/2012   PFIZER Comirnaty (Gray Top)Covid-19 Tri-Sucrose Vaccine 05/02/2020   PFIZER(Purple Top)SARS-COV-2 Vaccination 01/26/2019, 02/13/2019, 11/13/2019   Pfizer Covid-19 Vaccine Bivalent Booster 14yrs & up 11/27/2020   Pfizer(Comirnaty )Fall Seasonal Vaccine 12 years and older 10/23/2021, 11/13/2022   Pneumococcal Conjugate-13 05/25/2013   Pneumococcal Polysaccharide-23 05/08/2009   Tdap 10/13/2012   Zoster Recombinant(Shingrix) 09/27/2019, 12/31/2019   Zoster, Live 01/15/2011    Screening Tests Health Maintenance  Topic Date Due   DTaP/Tdap/Td (2 - Td or Tdap) 10/14/2022   COVID-19 Vaccine (8 - Pfizer risk 2024-25 season) 09/15/2023   Medicare Annual Wellness (AWV)  10/07/2024   Pneumococcal Vaccine: 50+ Years  Completed   Influenza Vaccine  Completed   DEXA SCAN  Completed   Zoster Vaccines- Shingrix  Completed   HPV VACCINES  Aged Out   Meningococcal B Vaccine  Aged Out    Health Maintenance Items Addressed:   Additional Screening:  Vision Screening: Recommended annual ophthalmology exams for early detection of glaucoma and other disorders of the eye. Is the patient up to date with their annual eye exam?  Yes  Who is the provider or what  is the name of the office in which the patient attends annual eye exams? Dr Darcie  Dental Screening: Recommended annual dental exams for proper oral hygiene  Community Resource Referral / Chronic Care Management: CRR required this visit?  No   CCM required this visit?  No   Plan:    I have personally reviewed and noted the following in the patient's chart:   Medical and social history Use of alcohol, tobacco or illicit drugs  Current medications and supplements including opioid prescriptions. Patient is not currently taking opioid prescriptions. Functional ability and status Nutritional status Physical activity Advanced directives List of other physicians Hospitalizations, surgeries, and ER visits in previous 12 months Vitals Screenings to  include cognitive, depression, and falls Referrals and appointments  In addition, I have reviewed and discussed with patient certain preventive protocols, quality metrics, and best practice recommendations. A written personalized care plan for preventive services as well as general preventive health recommendations were provided to patient.   Rojelio LELON Blush, LPN   [id number]   After Visit Summary: (In Person-Printed) AVS printed and given to the patient  Notes: Nothing significant to report at this time.

## 2023-10-08 NOTE — Addendum Note (Signed)
 Addended by: TANDA ROJELIO ORN on: 10/08/2023 10:27 AM   Modules accepted: Orders

## 2023-11-05 ENCOUNTER — Other Ambulatory Visit (HOSPITAL_COMMUNITY)
Admission: RE | Admit: 2023-11-05 | Discharge: 2023-11-05 | Disposition: A | Discharge status: Home / Self Care | Payer: Self-pay | Source: Ambulatory Visit | Attending: Medical Genetics | Admitting: Medical Genetics

## 2023-11-10 ENCOUNTER — Encounter: Payer: Self-pay | Admitting: Family Medicine

## 2023-11-10 MED ORDER — METOPROLOL SUCCINATE ER 50 MG PO TB24: ORAL_TABLET | ORAL | 1 refills | Status: AC

## 2023-11-14 LAB — GENECONNECT MOLECULAR SCREEN: Genetic Analysis Overall Interpretation: NEGATIVE

## 2023-11-15 ENCOUNTER — Other Ambulatory Visit: Payer: Self-pay | Admitting: Physician Assistant

## 2023-11-20 DIAGNOSIS — G629 Polyneuropathy, unspecified: Secondary | ICD-10-CM | POA: Diagnosis not present

## 2023-12-05 DIAGNOSIS — Z1231 Encounter for screening mammogram for malignant neoplasm of breast: Secondary | ICD-10-CM | POA: Diagnosis not present

## 2023-12-05 LAB — HM MAMMOGRAPHY

## 2023-12-08 ENCOUNTER — Ambulatory Visit: Payer: Self-pay | Admitting: Obstetrics and Gynecology

## 2023-12-08 ENCOUNTER — Encounter: Payer: Self-pay | Admitting: Obstetrics and Gynecology

## 2023-12-14 ENCOUNTER — Other Ambulatory Visit: Payer: Self-pay | Admitting: Physician Assistant

## 2023-12-22 ENCOUNTER — Ambulatory Visit (INDEPENDENT_AMBULATORY_CARE_PROVIDER_SITE_OTHER): Admitting: Cardiovascular Disease

## 2023-12-22 ENCOUNTER — Encounter (HOSPITAL_BASED_OUTPATIENT_CLINIC_OR_DEPARTMENT_OTHER): Payer: Self-pay

## 2023-12-22 ENCOUNTER — Ambulatory Visit (HOSPITAL_BASED_OUTPATIENT_CLINIC_OR_DEPARTMENT_OTHER): Admitting: Family Medicine

## 2023-12-22 ENCOUNTER — Encounter (HOSPITAL_BASED_OUTPATIENT_CLINIC_OR_DEPARTMENT_OTHER): Payer: Self-pay | Admitting: Cardiovascular Disease

## 2023-12-22 VITALS — BP 138/74 | HR 65 | Ht 66.5 in | Wt 202.0 lb

## 2023-12-22 DIAGNOSIS — E7800 Pure hypercholesterolemia, unspecified: Secondary | ICD-10-CM | POA: Diagnosis not present

## 2023-12-22 DIAGNOSIS — I10 Essential (primary) hypertension: Secondary | ICD-10-CM

## 2023-12-22 DIAGNOSIS — Z5181 Encounter for therapeutic drug level monitoring: Secondary | ICD-10-CM

## 2023-12-22 MED ORDER — SPIRONOLACTONE 25 MG PO TABS: 25.0000 mg | ORAL_TABLET | Freq: Every day | ORAL | 3 refills | Status: AC

## 2023-12-22 MED ORDER — AMLODIPINE BESYLATE 5 MG PO TABS: 5.0000 mg | ORAL_TABLET | Freq: Every day | ORAL | 3 refills | Status: AC

## 2023-12-22 NOTE — Patient Instructions (Signed)
 Medication Instructions:  STOP OLMESARTAN    START SPIRONOLACTONE  25 MG DAILY   START CALCIUM SUPPLEMENT   *If you need a refill on your cardiac medications before your next appointment, please call your pharmacy*  Lab Work: BMET IN 1 TO 2 WEEKS   If you have labs (blood work) drawn today and your tests are completely normal, you will receive your results only by: MyChart Message (if you have MyChart) OR A paper copy in the mail If you have any lab test that is abnormal or we need to change your treatment, we will call you to review the results.  Testing/Procedures: NONE  Follow-Up: At [facility], you and your health needs are our priority.  As part of our continuing mission to provide you with exceptional heart care, our providers are all part of one team.  This team includes your primary Cardiologist (physician) and Advanced Practice Providers or APPs (Physician Assistants and Nurse Practitioners) who all work together to provide you with the care you need, when you need it.  Your next appointment:   2 TO 3 month(s)  Provider:   Rosaline Bane, NP, Reche Finder, NP, or Allean Mink, PharmD   1 YEAR WITH DR [facility]    We recommend signing up for the patient portal called MyChart.  Sign up information is provided on this After Visit Summary.  MyChart is used to connect with patients for Virtual Visits (Telemedicine).  Patients are able to view lab/test results, encounter notes, upcoming appointments, etc.  Non-urgent messages can be sent to your provider as well.   To learn more about what you can do with MyChart, go to [email or web address].   Other Instructions MONITOR YOUR BLOOD PRESSURE DAILY AND BRING YOUR READINGS PLUS MACHINE TO YOUR FOLLOW UP APPOINTMENT

## 2023-12-22 NOTE — Progress Notes (Signed)
 Cardiology Office Note:  .   Date:  12/22/2023  ID:  Margaret Mitchell, DOB 1940-07-03, MRN [id number] PCP: Micheal Wolm LELON, MD  [facility] Providers Cardiologist:  Debby Sor, MD (Inactive)    History of Present Illness: .    Margaret Mitchell is a 83 y.o. female with hypertension and hypercholesterolemia here for follow up.  She was previously a patient of Dr. Sor.    Discussed the use of AI scribe software for clinical note transcription with the patient, who gave verbal consent to proceed.  History of Present Illness Margaret Mitchell has been experiencing persistent headaches since Friday, originating at the base of her head and radiating to the top. She initially thought they were sinus-related and took sinus medication without relief. She has a history of migraines, previously managed by a headache clinic, but notes that this headache has been persistent. Lack of sleep can trigger her headaches. She has tried Excedrin and coffee in the past for relief, as advised by a pharmacist.  She experiences leg cramps that begin as soon as she gets into bed, but not during the daytime. Her feet are cold to the touch and feel wet. She has been experiencing unsteadiness while walking for about a year, requiring her to hold onto rails for stability. She reports that her headache doctor suggested she might have peripheral neuropathy, and she is scheduled to see a neurologist next month.  Her blood pressure readings at home have been around 140/80 mmHg, with occasional lower readings. She mentions a new blood pressure machine and notes that her blood pressure is usually in the 130s. She is currently on amlodipine  and metoprolol  for hypertension. She reports difficulty obtaining her current medication, olmesartan , due to prior authorization issues.  She enjoys walking but is limited by her unsteadiness and concerns about falling. She does not engage in much exercise due to her balance issues. She  cooks most of her meals at home, avoids adding extra salt, and uses vegetable oil for cooking. Her son is a vegan, which influences some of her dietary choices.  She has a history of high calcium levels, which led to parathyroid surgery. She does not currently take calcium supplements, although her gynecologist recommended it last December.  ROS:  As per HPI  Studies Reviewed: SABRA   EKG Interpretation Date/Time:  Monday December 22 2023 10:54:07 EST Ventricular Rate:  65 PR Interval:  142 QRS Duration:  80 QT Interval:  406 QTC Calculation: 422 R Axis:   75  Text Interpretation: Normal sinus rhythm Normal ECG When compared with ECG of 30-Dec-2022 12:00, No significant change was found Confirmed by Raford Riggs ([id number]) on 12/22/2023 11:10:13 AM   Echo 04/2019:  1. Left ventricular ejection fraction, by estimation, is 60 to 65%. The  left ventricle has normal function. The left ventricle has no regional  wall motion abnormalities. Left ventricular diastolic parameters are  consistent with Grade II diastolic  dysfunction (pseudonormalization).   2. Right ventricular systolic function is normal. The right ventricular  size is normal. There is normal pulmonary artery systolic pressure. The  estimated right ventricular systolic pressure is 19.4 mmHg.   3. The mitral valve is grossly normal. Mild mitral valve regurgitation.  No evidence of mitral stenosis.   4. The aortic valve is tricuspid. Aortic valve regurgitation is not  visualized. No aortic stenosis is present.   5. The inferior vena cava is normal in size with greater than 50%  respiratory variability, suggesting  right atrial pressure of 3 mmHg.   Risk Assessment/Calculations:             Physical Exam:   VS:  BP 138/74   Pulse 65   Ht 5' 6.5 (1.689 m)   Wt 202 lb (91.6 kg)   SpO2 98%   BMI 32.12 kg/m  , BMI Body mass index is 32.12 kg/m. GENERAL:  Well appearing HEENT: Pupils equal round and reactive, fundi not  visualized, oral mucosa unremarkable NECK:  No jugular venous distention, waveform within normal limits, carotid upstroke brisk and symmetric, no bruits, no thyromegaly LUNGS:  Clear to auscultation bilaterally HEART:  RRR.  PMI not displaced or sustained,S1 and S2 within normal limits, no S3, no S4, no clicks, no rubs, no murmurs ABD:  Flat, positive bowel sounds normal in frequency in pitch, no bruits, no rebound, no guarding, no midline pulsatile mass, no hepatomegaly, no splenomegaly EXT:  2 plus pulses throughout, no edema, no cyanosis no clubbing SKIN:  No rashes no nodules NEURO:  Cranial nerves II through XII grossly intact, motor grossly intact throughout PSYCH:  Cognitively intact, oriented to person place and time   ASSESSMENT AND PLAN: .    Assessment & Plan # Primary hypertension Blood pressure elevated. Current regimen includes amlodipine  and metoprolol . Olmesartan  not optimal due to low potassium. Spironolactone  considered to improve potassium and control blood pressure. - Discontinued olmesartan . - Initiated spironolactone  25mg  daily. - Check BMP in one week to monitor potassium levels. - Continue amlodipine  and metoprolol . - Encouraged lifestyle modifications, including exercise and dietary salt reduction.  # Migraine headache Chronic migraines with current episode since Friday. Excedrin and coffee effective in past. - Recommended Excedrin and coffee for acute migraine relief.  # Hypocalcemia Low calcium levels s/p parathyroidectomy. No current supplementation. History of parathyroid surgery for hypercalcemia. - Initiated over-the-counter calcium supplement. - Monitor calcium levels with upcoming labs.      Dispo: f/u in 2-3 months  Signed, Margaret Scarce, MD

## 2024-01-13 LAB — BASIC METABOLIC PANEL WITH GFR
BUN/Creatinine Ratio: 13 (ref 12–28)
BUN: 13 mg/dL (ref 8–27)
CO2: 23 mmol/L (ref 20–29)
Calcium: 9.5 mg/dL (ref 8.7–10.3)
Chloride: 103 mmol/L (ref 96–106)
Creatinine, Ser: 1 mg/dL (ref 0.57–1.00)
Glucose: 69 mg/dL — ABNORMAL LOW (ref 70–99)
Potassium: 4.2 mmol/L (ref 3.5–5.2)
Sodium: 142 mmol/L (ref 134–144)
eGFR: 57 mL/min/1.73 — ABNORMAL LOW

## 2024-01-16 ENCOUNTER — Ambulatory Visit: Payer: Self-pay

## 2024-01-16 NOTE — Telephone Encounter (Signed)
 FYI Only or Action Required?: FYI only for provider: UC advised.  Patient was last seen in primary care on 09/08/2023 by Micheal Wolm ORN, MD.  Called Nurse Triage reporting Groin Pain.  Symptoms began several days ago.  Interventions attempted: Nothing.  Symptoms are: unchanged.  Triage Disposition: See Physician Within 24 Hours  Patient/caregiver understands and will follow disposition?: Yes   Reason for Disposition  [1] MODERATE pain (e.g., interferes with normal activities, limping) AND [2] high-risk adult (e.g., age > 60 years, osteoporosis, chronic steroid use)  Answer Assessment - Initial Assessment Questions Patient experiences pain when walking, even raising right foot up, movement is limited due to pain. Advised UC for symptoms. Patient agreeable.  1. MECHANISM: How did the injury happen? (e.g., twisting injury, direct blow)      Walked around apartment complex, long hallway 2-3 times , which is more exercise than usually does  2. ONSET: When did the injury happen? (e.g., minutes, hours ago)      3 days ago  3. LOCATION: Where is the injury located?      Right groin area  4. APPEARANCE of INJURY: What does the injury look like?  (e.g., looks normal; bruise, swelling)     Looks normal  5. PAIN: Is there pain? If Yes, ask: How bad is the pain?   What does it keep you from doing? (Scale 0-10; or none, mild, moderate, severe)     8/10  6. SIZE: For cuts, bruises, or swelling, ask: How large is it? (e.g., inches or centimeters;  entire joint)      Denies  7. TETANUS: For any breaks in the skin, ask: When was your last tetanus booster?     N/A  8. OTHER SYMPTOMS: Do you have any other symptoms?      Denies  Protocols used: Groin Injury and Strain-A-AH  Message from [place] M sent at 01/16/2024  3:58 PM EST  Reason for Triage: Pt took a walk Tuesday and now has pain in her groin area like maybe a pulled muscle. Not sure what to do for treatment  and is looking for a suggestion

## 2024-01-17 ENCOUNTER — Ambulatory Visit: Payer: Self-pay | Admitting: Cardiovascular Disease

## 2024-01-19 NOTE — Telephone Encounter (Signed)
 I spoke with the patient and she reported she is feeling no better. Appt scheduled for 01/20/2024

## 2024-01-20 ENCOUNTER — Encounter: Payer: Self-pay | Admitting: Family Medicine

## 2024-01-20 ENCOUNTER — Ambulatory Visit: Admitting: Family Medicine

## 2024-01-20 VITALS — BP 140/76 | HR 71 | Temp 97.6°F | Wt 200.9 lb

## 2024-01-20 DIAGNOSIS — D472 Monoclonal gammopathy: Secondary | ICD-10-CM

## 2024-01-20 DIAGNOSIS — R1031 Right lower quadrant pain: Secondary | ICD-10-CM

## 2024-01-20 DIAGNOSIS — I10 Essential (primary) hypertension: Secondary | ICD-10-CM | POA: Diagnosis not present

## 2024-01-20 NOTE — Patient Instructions (Signed)
 BP by your machine was 128/88 and with our manual cuff 140/76.

## 2024-01-20 NOTE — Progress Notes (Signed)
 "  Established Patient Office Visit  Subjective   Patient ID: Margaret Mitchell, female    DOB: 09-07-1940  Age: 84 y.o. MRN: [id number]  Chief Complaint  Patient presents with   Groin Pain    HPI   Ms. Menor has history of hypertension, GERD, osteopenia, restless leg syndrome, monoclonal gammopathy of unknown significance.  She is seen today with new problem of right groin pain which started over a week ago.  She states she is actually about 75% better today.  Her pain is right anterior to medial groin region.  Denies any fall or injury.  Hip pain was slightly worse with hip flexion and adduction.  No leg edema.  No warmth.  No palpable hernia masses.  She has not noted any adenopathy.  No claudication symptoms.  She has hypertension and recently saw cardiologist and was taken off or losartan  and placed on Aldactone .  She had follow-up basic metabolic panel which was stable.  Blood pressures are slightly improved but still not greatly controlled.  She brings in her cuff today to compare with ours.  She has history of peripheral neuropathy and some extremity paresthesias and we had obtained myeloma panel and it was determined she has monoclonal gammopathy of uncertain significance.  Followed closely by hematology.  Past Medical History:  Diagnosis Date   Allergy, unspecified not elsewhere classified    Anxiety    Arthritis    Cataract SURGERY 2013 AND 2015   Cervical dysplasia    prior to hysterectomy   GERD (gastroesophageal reflux disease)    Headache(784.0)    Hypertension    IBS (irritable bowel syndrome)    Osteopenia 12/2015   T score -1.1 FRAX 3.7%/0.4%   Pelvic adhesions    Pelvic pain in female    Pure hypercholesterolemia 12/22/2023   Sinus infection    Thyroid  disease    Parathyroid cyst   Past Surgical History:  Procedure Laterality Date   ABDOMINAL HYSTERECTOMY  1983   TAH/BSO for pelvic pain and adhesions   APPENDECTOMY  1994   BREAST SURGERY      Biopsy-Benign   CATARACT EXTRACTION Bilateral 2013   COLPOSCOPY     EYE SURGERY  TORN RETINA 2018   FOOT SURGERY Bilateral    HAMMER TOE SURGERY Bilateral    PARATHYROID ADENOMA REMOVED  02/1980   ROTATOR CUFF REPAIR     X2   SHOULDER INJECTION  10/2011   TONSILLECTOMY     TUBAL LIGATION  1981   VESICOVAGINAL FISTULA CLOSURE W/ TAH      reports that she has never smoked. She has never been exposed to tobacco smoke. She has never used smokeless tobacco. She reports current alcohol use of about 1.0 standard drink of alcohol per week. She reports that she does not use drugs. family history includes Cancer in her brother and father; Dementia in her mother; Diabetes in her maternal aunt, maternal grandmother, and mother; Heart disease in her maternal grandmother; Heart failure in her paternal grandmother; Hypertension in her maternal grandmother; Leukemia in her brother; Lung cancer in her father; Stroke in her maternal grandmother. Allergies[1]  Review of Systems  Constitutional:  Negative for chills and fever.  Respiratory:  Negative for cough and shortness of breath.   Cardiovascular:  Negative for chest pain.  Neurological:  Negative for sensory change and focal weakness.      Objective:     BP (!) 140/76 (BP Location: Left Arm, Cuff Size: Large)   Pulse  71   Temp 97.6 F (36.4 C) (Oral)   Wt 200 lb 14.4 oz (91.1 kg)   SpO2 99%   BMI 31.94 kg/m  BP Readings from Last 3 Encounters:  01/20/24 (!) 140/76  12/22/23 138/74  10/08/23 118/60   Wt Readings from Last 3 Encounters:  01/20/24 200 lb 14.4 oz (91.1 kg)  12/22/23 202 lb (91.6 kg)  10/08/23 197 lb 3.2 oz (89.4 kg)      Physical Exam Vitals reviewed.  Constitutional:      General: She is not in acute distress.    Appearance: Normal appearance. She is not toxic-appearing.  Cardiovascular:     Rate and Rhythm: Normal rate and regular rhythm.  Pulmonary:     Effort: Pulmonary effort is normal.     Breath sounds:  Normal breath sounds.  Musculoskeletal:     Right lower leg: No edema.     Left lower leg: No edema.     Comments: Good range of motion right hip with internal/external rotation.  No pain with rotation.  No reproducible pain with hip flexion against resistance nor adduction or abduction  Neurological:     Mental Status: She is alert.      No results found for any visits on 01/20/24.  Last CBC Lab Results  Component Value Date   WBC 8.3 10/01/2023   HGB 12.8 10/01/2023   HCT 39.8 10/01/2023   MCV 94.3 10/01/2023   MCH 30.3 10/01/2023   RDW 12.3 10/01/2023   PLT 265 10/01/2023   Last metabolic panel Lab Results  Component Value Date   GLUCOSE 69 (L) 01/13/2024   NA 142 01/13/2024   K 4.2 01/13/2024   CL 103 01/13/2024   CO2 23 01/13/2024   BUN 13 01/13/2024   CREATININE 1.00 01/13/2024   EGFR 57 (L) 01/13/2024   CALCIUM 9.5 01/13/2024   PHOS 4.0 06/16/2013   PROT 7.5 10/01/2023   ALBUMIN 4.0 10/01/2023   LABGLOB 3.6 10/01/2023   AGRATIO 1.2 07/22/2023   BILITOT 0.4 10/01/2023   ALKPHOS 81 10/01/2023   AST 22 10/01/2023   ALT 14 10/01/2023   ANIONGAP 6 10/01/2023   Last lipids Lab Results  Component Value Date   CHOL 178 04/08/2019   HDL 72 04/08/2019   LDLCALC 83 04/08/2019   LDLDIRECT 104.8 05/07/2010   TRIG 137 04/08/2019   CHOLHDL 2.5 04/08/2019      The ASCVD Risk score (Arnett DK, et al., 2019) failed to calculate for the following reasons:   The 2019 ASCVD risk score is only valid for ages 72 to 21   * - Cholesterol units were assumed    Assessment & Plan:   #1 right groin strain.  Symptoms are 75% improved at this time.  She has no evidence for any weakness.  Doubt hip joint pathology.  She has good internal and external rotation without difficulty.  No hernia.  Recommend observation since symptoms improving.  Follow-up for any recurrent or persistent pain  #2 hypertension.  Blood pressure check with her machine left arm seated 128/88 and  140/76 with ours with large cuff.  She is currently on amlodipine , Aldactone , and metoprolol  XL.  Continue low-sodium diet.  Continue weight loss efforts.  She has scheduled follow-up with cardiology  #3 monoclonal gammopathy of uncertain significance.  Followed by hematology.  Wolm Scarlet, MD     [1]  Allergies Allergen Reactions   Prednisone Other (See Comments)    REACTION: heart palpatations  In  larger doses   Sulfa  Antibiotics Swelling   Sulfasalazine Swelling   Sulfonamide Derivatives Swelling   Vioxx [Rofecoxib] Other (See Comments)    REACTION: mouth swelling (VIOXX)   Other Other (See Comments)    Seldane   Seldane [Terfenadine] Other (See Comments)    SELDANE   "

## 2024-02-03 ENCOUNTER — Other Ambulatory Visit (HOSPITAL_BASED_OUTPATIENT_CLINIC_OR_DEPARTMENT_OTHER): Payer: Self-pay

## 2024-03-09 ENCOUNTER — Ambulatory Visit (HOSPITAL_BASED_OUTPATIENT_CLINIC_OR_DEPARTMENT_OTHER): Admitting: Family

## 2024-03-23 ENCOUNTER — Inpatient Hospital Stay

## 2024-03-31 ENCOUNTER — Inpatient Hospital Stay: Admitting: Internal Medicine

## 2024-10-13 ENCOUNTER — Ambulatory Visit
